# Patient Record
Sex: Male | Born: 1950 | Race: Black or African American | Hispanic: No | Marital: Single | Smoking: Former smoker
Health system: Southern US, Community
[De-identification: ages and names within clinical notes are randomized; demographics above are authoritative.]

## PROBLEM LIST (undated history)

## (undated) DIAGNOSIS — E785 Hyperlipidemia, unspecified: Secondary | ICD-10-CM

## (undated) DIAGNOSIS — H409 Unspecified glaucoma: Secondary | ICD-10-CM

## (undated) DIAGNOSIS — I10 Essential (primary) hypertension: Secondary | ICD-10-CM

## (undated) DIAGNOSIS — B1920 Unspecified viral hepatitis C without hepatic coma: Secondary | ICD-10-CM

## (undated) DIAGNOSIS — S8990XA Unspecified injury of unspecified lower leg, initial encounter: Secondary | ICD-10-CM

## (undated) DIAGNOSIS — K7460 Unspecified cirrhosis of liver: Secondary | ICD-10-CM

## (undated) DIAGNOSIS — K635 Polyp of colon: Secondary | ICD-10-CM

## (undated) DIAGNOSIS — I639 Cerebral infarction, unspecified: Secondary | ICD-10-CM

## (undated) DIAGNOSIS — M1990 Unspecified osteoarthritis, unspecified site: Secondary | ICD-10-CM

## (undated) DIAGNOSIS — H269 Unspecified cataract: Secondary | ICD-10-CM

## (undated) DIAGNOSIS — N400 Enlarged prostate without lower urinary tract symptoms: Secondary | ICD-10-CM

## (undated) HISTORY — DX: Hyperlipidemia, unspecified: E78.5

## (undated) HISTORY — PX: KNEE SURGERY: SHX244

## (undated) HISTORY — DX: Unspecified cirrhosis of liver: K74.60

## (undated) HISTORY — DX: Polyp of colon: K63.5

## (undated) HISTORY — DX: Unspecified osteoarthritis, unspecified site: M19.90

## (undated) HISTORY — DX: Cerebral infarction, unspecified: I63.9

## (undated) HISTORY — DX: Benign prostatic hyperplasia without lower urinary tract symptoms: N40.0

---
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
## 1979-04-03 HISTORY — PX: KNEE SURGERY: SHX244

## 1986-08-02 HISTORY — PX: INGUINAL HERNIA REPAIR: SUR1180

## 2012-08-02 DIAGNOSIS — I639 Cerebral infarction, unspecified: Secondary | ICD-10-CM

## 2012-08-02 HISTORY — DX: Cerebral infarction, unspecified: I63.9

## 2014-09-21 ENCOUNTER — Inpatient Hospital Stay (HOSPITAL_COMMUNITY): Payer: Medicare HMO

## 2014-09-21 ENCOUNTER — Inpatient Hospital Stay (HOSPITAL_BASED_OUTPATIENT_CLINIC_OR_DEPARTMENT_OTHER)
Admission: EM | Admit: 2014-09-21 | Discharge: 2014-09-25 | DRG: 065 | Disposition: A | Discharge status: Rehabilitation Facility | Payer: Medicare HMO | Attending: Family Medicine | Admitting: Family Medicine

## 2014-09-21 ENCOUNTER — Encounter (HOSPITAL_BASED_OUTPATIENT_CLINIC_OR_DEPARTMENT_OTHER): Payer: Self-pay | Admitting: *Deleted

## 2014-09-21 ENCOUNTER — Emergency Department (HOSPITAL_BASED_OUTPATIENT_CLINIC_OR_DEPARTMENT_OTHER): Payer: Medicare HMO

## 2014-09-21 DIAGNOSIS — E785 Hyperlipidemia, unspecified: Secondary | ICD-10-CM | POA: Diagnosis present

## 2014-09-21 DIAGNOSIS — R7989 Other specified abnormal findings of blood chemistry: Secondary | ICD-10-CM | POA: Diagnosis present

## 2014-09-21 DIAGNOSIS — R001 Bradycardia, unspecified: Secondary | ICD-10-CM | POA: Diagnosis present

## 2014-09-21 DIAGNOSIS — I69351 Hemiplegia and hemiparesis following cerebral infarction affecting right dominant side: Secondary | ICD-10-CM | POA: Diagnosis present

## 2014-09-21 DIAGNOSIS — D696 Thrombocytopenia, unspecified: Secondary | ICD-10-CM | POA: Diagnosis present

## 2014-09-21 DIAGNOSIS — I10 Essential (primary) hypertension: Secondary | ICD-10-CM | POA: Diagnosis present

## 2014-09-21 DIAGNOSIS — M174 Other bilateral secondary osteoarthritis of knee: Secondary | ICD-10-CM | POA: Diagnosis present

## 2014-09-21 DIAGNOSIS — Z6824 Body mass index (BMI) 24.0-24.9, adult: Secondary | ICD-10-CM

## 2014-09-21 DIAGNOSIS — I739 Peripheral vascular disease, unspecified: Secondary | ICD-10-CM | POA: Diagnosis present

## 2014-09-21 DIAGNOSIS — E669 Obesity, unspecified: Secondary | ICD-10-CM | POA: Diagnosis present

## 2014-09-21 DIAGNOSIS — Z8249 Family history of ischemic heart disease and other diseases of the circulatory system: Secondary | ICD-10-CM | POA: Diagnosis not present

## 2014-09-21 DIAGNOSIS — Z87891 Personal history of nicotine dependence: Secondary | ICD-10-CM | POA: Diagnosis not present

## 2014-09-21 DIAGNOSIS — R740 Nonspecific elevation of levels of transaminase and lactic acid dehydrogenase [LDH]: Secondary | ICD-10-CM

## 2014-09-21 DIAGNOSIS — E119 Type 2 diabetes mellitus without complications: Secondary | ICD-10-CM | POA: Diagnosis present

## 2014-09-21 DIAGNOSIS — R7401 Elevation of levels of liver transaminase levels: Secondary | ICD-10-CM

## 2014-09-21 DIAGNOSIS — Z823 Family history of stroke: Secondary | ICD-10-CM | POA: Diagnosis not present

## 2014-09-21 DIAGNOSIS — F1290 Cannabis use, unspecified, uncomplicated: Secondary | ICD-10-CM | POA: Diagnosis present

## 2014-09-21 DIAGNOSIS — I639 Cerebral infarction, unspecified: Secondary | ICD-10-CM | POA: Diagnosis present

## 2014-09-21 HISTORY — DX: Unspecified glaucoma: H40.9

## 2014-09-21 HISTORY — DX: Unspecified cataract: H26.9

## 2014-09-21 HISTORY — DX: Unspecified injury of unspecified lower leg, initial encounter: S89.90XA

## 2014-09-21 HISTORY — DX: Essential (primary) hypertension: I10

## 2014-09-21 LAB — URINALYSIS, ROUTINE W REFLEX MICROSCOPIC
Bilirubin Urine: NEGATIVE
Glucose, UA: NEGATIVE mg/dL
Hgb urine dipstick: NEGATIVE
KETONES UR: 15 mg/dL — AB
NITRITE: NEGATIVE
Protein, ur: NEGATIVE mg/dL
Specific Gravity, Urine: 1.019 (ref 1.005–1.030)
UROBILINOGEN UA: 0.2 mg/dL (ref 0.0–1.0)
pH: 5.5 (ref 5.0–8.0)

## 2014-09-21 LAB — DIFFERENTIAL
BASOS PCT: 1 % (ref 0–1)
Basophils Absolute: 0 10*3/uL (ref 0.0–0.1)
EOS PCT: 3 % (ref 0–5)
Eosinophils Absolute: 0.2 10*3/uL (ref 0.0–0.7)
LYMPHS ABS: 1.1 10*3/uL (ref 0.7–4.0)
Lymphocytes Relative: 22 % (ref 12–46)
MONO ABS: 0.4 10*3/uL (ref 0.1–1.0)
MONOS PCT: 9 % (ref 3–12)
NEUTROS ABS: 3.4 10*3/uL (ref 1.7–7.7)
NEUTROS PCT: 65 % (ref 43–77)

## 2014-09-21 LAB — COMPREHENSIVE METABOLIC PANEL
ALBUMIN: 3.7 g/dL (ref 3.5–5.2)
ALT: 45 U/L (ref 0–53)
AST: 56 U/L — ABNORMAL HIGH (ref 0–37)
Alkaline Phosphatase: 62 U/L (ref 39–117)
Anion gap: 6 (ref 5–15)
BUN: 13 mg/dL (ref 6–23)
CO2: 27 mmol/L (ref 19–32)
CREATININE: 0.98 mg/dL (ref 0.50–1.35)
Calcium: 8.7 mg/dL (ref 8.4–10.5)
Chloride: 102 mmol/L (ref 96–112)
GFR calc Af Amer: >90 mL/min (ref >90)
GFR, EST NON AFRICAN AMERICAN: 84 mL/min — AB (ref >90)
Glucose, Bld: 92 mg/dL (ref 70–99)
Potassium: 3.6 mmol/L (ref 3.5–5.1)
SODIUM: 135 mmol/L (ref 135–145)
TOTAL PROTEIN: 7.2 g/dL (ref 6.0–8.3)
Total Bilirubin: 0.9 mg/dL (ref 0.3–1.2)

## 2014-09-21 LAB — URINE MICROSCOPIC-ADD ON

## 2014-09-21 LAB — CBC
HCT: 45.4 % (ref 39.0–52.0)
HEMOGLOBIN: 15.3 g/dL (ref 13.0–17.0)
MCH: 30.9 pg (ref 26.0–34.0)
MCHC: 33.7 g/dL (ref 30.0–36.0)
MCV: 91.7 fL (ref 78.0–100.0)
PLATELETS: 175 10*3/uL (ref 150–400)
RBC: 4.95 MIL/uL (ref 4.22–5.81)
RDW: 12.9 % (ref 11.5–15.5)
WBC: 5.2 10*3/uL (ref 4.0–10.5)

## 2014-09-21 LAB — APTT: aPTT: 37 seconds (ref 24–37)

## 2014-09-21 LAB — RAPID URINE DRUG SCREEN, HOSP PERFORMED
Amphetamines: NOT DETECTED
BARBITURATES: NOT DETECTED
Benzodiazepines: NOT DETECTED
COCAINE: NOT DETECTED
Opiates: NOT DETECTED
TETRAHYDROCANNABINOL: POSITIVE — AB

## 2014-09-21 LAB — PROTIME-INR
INR: 1.09 (ref 0.00–1.49)
Prothrombin Time: 14.1 seconds (ref 11.6–15.2)

## 2014-09-21 LAB — ETHANOL

## 2014-09-21 LAB — TROPONIN I: Troponin I: <0.03 ng/mL (ref <0.031)

## 2014-09-21 MED ORDER — ASPIRIN 300 MG RE SUPP: 300.0000 mg | Freq: Every day | RECTAL | Status: DC

## 2014-09-21 MED ORDER — ENOXAPARIN SODIUM 40 MG/0.4ML ~~LOC~~ SOLN
40.0000 mg | Freq: Every day | SUBCUTANEOUS | Status: DC
  Administered 2014-09-21 – 2014-09-24 (×4): 40 mg via SUBCUTANEOUS
  Filled 2014-09-21 (×4): qty 0.4

## 2014-09-21 MED ORDER — STROKE: EARLY STAGES OF RECOVERY BOOK
Freq: Once | Status: AC
  Administered 2014-09-21: 1

## 2014-09-21 MED ORDER — ASPIRIN 325 MG PO TABS
325.0000 mg | ORAL_TABLET | Freq: Every day | ORAL | Status: DC
  Administered 2014-09-21 – 2014-09-25 (×5): 325 mg via ORAL
  Filled 2014-09-21 (×5): qty 1

## 2014-09-21 MED ORDER — SODIUM CHLORIDE 0.9 % IV SOLN
INTRAVENOUS | Status: DC
  Administered 2014-09-21: 23:00:00 via INTRAVENOUS

## 2014-09-21 MED ORDER — SENNOSIDES-DOCUSATE SODIUM 8.6-50 MG PO TABS
1.0000 | ORAL_TABLET | Freq: Every evening | ORAL | Status: DC | PRN
  Administered 2014-09-23: 1 via ORAL
  Filled 2014-09-21: qty 1

## 2014-09-21 MED ORDER — HYDRALAZINE HCL 20 MG/ML IJ SOLN: 10.0000 mg | INTRAMUSCULAR | Status: DC | PRN

## 2014-09-21 NOTE — ED Notes (Signed)
[facility] here no changes.

## 2014-09-21 NOTE — ED Notes (Signed)
Pt out with [facility].

## 2014-09-21 NOTE — H&P (Signed)
[facility] History and Physical  [name] ONG:[id number] DOB: Aug 15, 1950 DOA: 09/21/2014  Referring physician: Patient was transferred from [facility]. PCP: Micheal Brazil, MD   Chief Complaint: Difficulty walking and right-sided weakness.  HPI: [name] is a 65 y.o. male with history of elevated blood pressure presently ER because of difficulty walking and right-sided weakness. Patient states his symptoms started almost a week ago with complaints of right-sided weakness and difficulty walking. Patient's sister saw the patient has been finding it difficult to walk to do daily living activities today and advised him to come to the ER. CT head shows possible subacute stroke. On exam patient has mild weakness on the right side. Patient has been admitted for further stroke workup. Patient states he was told he had high blood pressure 6 months ago but he did not follow on this. Patient otherwise denies any visual symptoms headache difficulty talking or swallowing. Has no weakness on the left side. Denies chest pain or shortness of breath.   Review of Systems: As presented in the history of presenting illness, rest negative.  Past Medical History  Diagnosis Date  . Knee injury     right knee injury   . Hypertension    Past Surgical History  Procedure Laterality Date  . No past surgeries     Social History:  reports that he has never smoked. He does not have any smokeless tobacco history on file. He reports that he does not drink alcohol. His drug history is not on file. Where does patient live home. Can patient participate in ADLs? Yes.  No Known Allergies  Family History:  Family History  Problem Relation Age of Onset  . Diabetes Mellitus II Mother   . Stroke Mother       Prior to Admission medications   Not on File    Physical Exam: Filed Vitals:   09/21/14 1900 09/21/14 1930 09/21/14 1936 09/21/14 2055  BP: 189/110 188/101 198/98 182/99   Pulse: 78 75 80 79  Temp:    99.1 F (37.3 C)  TempSrc:    Oral  Resp: 22 21 24 20   Height:      Weight:      SpO2: 99% 99% 100% 99%     General:  Moderately built and nourished.  Eyes: Anicteric no pallor.  ENT: No discharge from the ears eyes nose or mouth.  Neck: No mass felt. No neck rigidity.  Cardiovascular: S1-S2 heard.  Respiratory: No rhonchi or crepitations.  Abdomen: Soft nontender bowel sounds present.  Skin: No rash.  Musculoskeletal: No edema.  Psychiatric: Appears normal.  Neurologic: Alert awake oriented to time place and person. Has mild weakness in the right upper extremity and right lower extremity. Perla positive. Tongue is midline. No facial asymmetry.  Labs on Admission:  Basic Metabolic Panel:  Recent Labs Lab 09/21/14 1553  NA 135  K 3.6  CL 102  CO2 27  GLUCOSE 92  BUN 13  CREATININE 0.98  CALCIUM 8.7   Liver Function Tests:  Recent Labs Lab 09/21/14 1553  AST 56*  ALT 45  ALKPHOS 62  BILITOT 0.9  PROT 7.2  ALBUMIN 3.7   No results for input(s): LIPASE, AMYLASE in the last 168 hours. No results for input(s): AMMONIA in the last 168 hours. CBC:  Recent Labs Lab 09/21/14 1553  WBC 5.2  NEUTROABS 3.4  HGB 15.3  HCT 45.4  MCV 91.7  PLT 175   Cardiac Enzymes:  Recent  Labs Lab 09/21/14 1553  TROPONINI <0.03    BNP (last 3 results) No results for input(s): BNP in the last 8760 hours.  ProBNP (last 3 results) No results for input(s): PROBNP in the last 8760 hours.  CBG: No results for input(s): GLUCAP in the last 168 hours.  Radiological Exams on Admission: Ct Head Wo Contrast  09/21/2014   CLINICAL DATA:  Elevated blood pressure. Right-sided weakness for 1 week. History of hypertension.  EXAM: CT HEAD WITHOUT CONTRAST  TECHNIQUE: Contiguous axial images were obtained from the base of the skull through the vertex without intravenous contrast.  COMPARISON:  None.  FINDINGS: Ventricles are normal in size and  configuration.  There are no parenchymal masses or mass effect.  There is an area of hypoattenuation in the left centrum semiovale, likely due to chronic microvascular ischemic change. It could, potentially, reflect a subacute white matter infarction.  There other patchy areas of white matter hypoattenuation consistent with mild to moderate chronic microvascular ischemic change.  There is no evidence of a transcortical infarct.  There are no extra-axial masses or abnormal fluid collections.  There is no intracranial hemorrhage.  Visualized sinuses and mastoid air cells are clear. No skull lesion.  IMPRESSION: 1. Possible subacute white matter infarct along the left centrum semiovale. This could be further assessed with brain MRI if desired clinically. 2. No other evidence of a recent infarct. No intracranial hemorrhage. 3. Mild moderate chronic microvascular ischemic change.   Electronically Signed   By: Lajean Manes M.D.   On: 09/21/2014 17:25    EKG: Independently reviewed. Normal sinus rhythm with poor R-wave progression and LVH.  Assessment/Plan Principal Problem:   Stroke Active Problems:   Uncontrolled hypertension   1. Stroke - patient's symptoms are concerning for stroke with CAT scan findings. At this time I did discuss with Dr. Leonel Ramsay on call neurologist who will be seeing patient in consult. Patient is well beyond the TPA window period. At this time I have ordered MRI brain and MRA brain 2-D echo carotid Doppler PT OT swallow evaluation and we will closely monitor in telemetry for any arrhythmias. Aspirin. 2. Uncontrolled hypertension - at this time we will allow for permissive hypertension and I have placed patient on when necessary IV hydralazine for systolic blood pressure more than 210. If patient's blood pressure remains high will need defenite antihypertensives slowly added.   3. Marijuana positive in the urine drug screen - counselling requested.  chest x-ray is  pending.   DVT ProphylaxisLovenox. Code Status: Full code. Family Communication: Patient's sister at the bedside.  Disposition Plan:Admitting patient.     [name] N. [facility] Pager [phone].  If 7PM-7AM, please contact night-coverage [email or web address] Password [facility] 09/21/2014, 9:51 PM

## 2014-09-21 NOTE — ED Notes (Signed)
Denies: pain, sob, nausea, dizziness or other sx. Alert, NAD, calm, interactive. Report given via phone to [facility]. Pending arrival for transport to [facility]. Wife at [facility].

## 2014-09-21 NOTE — ED Provider Notes (Signed)
CSN: [id number]     Arrival date & time 09/21/14  1504 History   First MD Initiated Contact with Patient 09/21/14 1523     Chief Complaint  Patient presents with  . Weakness     (Consider location/radiation/quality/duration/timing/severity/associated sxs/prior Treatment) HPI Comments: [name] is a 64 y.o. male with a PMHx of HTN, who presents to the ED with complaints of right-sided weakness that began on Monday. Patient reports that he called the ambulance, who noted that his blood pressure was high and that he needs to see a primary care doctor, but did not transport him anywhere. He sought attention at the urgent care today, who directed him to the emergency room for possible stroke. Patient reports that he does not take any medications for his blood pressure because he does not go to a primary care doctor. He reports that the right arm and right leg have been weak since Monday, with no improvement during the course of the week. He reports some tingling in the right upper extremity and his sister at bedside reports some difficulties with his word finding, but does report that he has a stutter at baseline therefore difficult to determine whether he is truly having word finding difficulties. They deny any facial droop, asymmetry, or syncope. Patient denies any fevers, chills, neck pain or stiffness, headache, vision changes, dizziness, lightheadedness, chest pain, shortness of breath, abdominal pain, nausea, vomiting, diarrhea, constipation, dysuria, hematuria, or numbness. Denies any back pain. Reports that he has been having difficulty walking due to the right leg weakness. Patient has PCP Dr. Katherine Roan but has not been recently.  Patient is a 64 y.o. male presenting with weakness. The history is provided by the patient. No language interpreter was used.  Weakness This is a new problem. The current episode started in the past 7 days. The problem occurs constantly. The problem has been  unchanged. Associated symptoms include weakness (RUE/RLE). Pertinent negatives include no abdominal pain, arthralgias, chest pain, chills, coughing, fever, headaches, myalgias, nausea, neck pain, numbness, urinary symptoms, vertigo, visual change or vomiting. Nothing aggravates the symptoms. He has tried nothing for the symptoms. The treatment provided no relief.    Past Medical History  Diagnosis Date  . Knee injury     right knee injury   . Hypertension    History reviewed. No pertinent past surgical history. No family history on file. History  Substance Use Topics  . Smoking status: Never Smoker   . Smokeless tobacco: Not on file  . Alcohol Use: No    Review of Systems  Constitutional: Negative for fever and chills.  Respiratory: Negative for cough and shortness of breath.   Cardiovascular: Negative for chest pain.  Gastrointestinal: Negative for nausea, vomiting, abdominal pain, diarrhea and constipation.  Genitourinary: Negative for dysuria and hematuria.  Musculoskeletal: Negative for myalgias, back pain, arthralgias, neck pain and neck stiffness.  Skin: Negative for color change.  Neurological: Positive for speech difficulty (some difficulty with word finding) and weakness (RUE/RLE). Negative for dizziness, vertigo, tremors, syncope, facial asymmetry, light-headedness, numbness and headaches.       +tingling in RUE  Psychiatric/Behavioral: Negative for confusion.   10 Systems reviewed and are negative for acute change except as noted in the HPI.    Allergies  Review of patient's allergies indicates no known allergies.  Home Medications   Prior to Admission medications   Not on File   BP 186/120 mmHg  Pulse 92  Temp(Src) 98.8 F (37.1 C) (Oral)  Resp  20  Ht 5\' 10"  (1.778 m)  Wt 174 lb (78.926 kg)  BMI 24.97 kg/m2  SpO2 99% Physical Exam  Constitutional: He is oriented to person, place, and time. He appears well-developed and well-nourished.  Non-toxic  appearance. No distress.  Afebrile nontoxic NAD, BP at 173/99  HENT:  Head: Normocephalic and atraumatic.  Mouth/Throat: Oropharynx is clear and moist and mucous membranes are normal.  No facial asymmetry  Eyes: Conjunctivae and EOM are normal. Pupils are equal, round, and reactive to light. Right eye exhibits no discharge. Left eye exhibits no discharge.  PERRL, EOMI without nystagmus  Neck: Normal range of motion. Neck supple.  Cardiovascular: Normal rate, regular rhythm, normal heart sounds and intact distal pulses.  Exam reveals no gallop and no friction rub.   No murmur heard. RRR, nl s1/s2, no m/r/g, distal pulses intact, no pedal edema   Pulmonary/Chest: Effort normal and breath sounds normal. No respiratory distress. He has no decreased breath sounds. He has no wheezes. He has no rhonchi. He has no rales.  CTAB in all lung fields, no w/r/r, no hypoxia or increased WOB, speaking in full sentences, SpO2 99% on RA   Abdominal: Soft. Normal appearance and bowel sounds are normal. He exhibits no distension. There is no tenderness. There is no rigidity, no rebound, no guarding, no CVA tenderness, no tenderness at McBurney's point and negative Murphy's sign.  Musculoskeletal: Normal range of motion.  MAE x4 Strength 5/5 in all extremities Sensation grossly intact in all extremities Distal pulses intact  Neurological: He is alert and oriented to person, place, and time. He has normal strength. No cranial nerve deficit or sensory deficit. Coordination normal. GCS eye subscore is 4. GCS verbal subscore is 5. GCS motor subscore is 6.  CN 2-12 grossly intact A&O x4 GCS 15 Sensation and strength intact Coordination with finger-to-nose WNL +R hand pronator drift  Speech goal oriented  Skin: Skin is warm, dry and intact. No rash noted.  Psychiatric: He has a normal mood and affect.  Nursing note and vitals reviewed.   ED Course  Procedures (including critical care time) Labs Review Labs  Reviewed  COMPREHENSIVE METABOLIC PANEL - Abnormal; Notable for the following:    AST 56 (*)    GFR calc non Af Amer 84 (*)    All other components within normal limits  URINE RAPID DRUG SCREEN (HOSP PERFORMED) - Abnormal; Notable for the following:    Tetrahydrocannabinol POSITIVE (*)    All other components within normal limits  URINALYSIS, ROUTINE W REFLEX MICROSCOPIC - Abnormal; Notable for the following:    Ketones, ur 15 (*)    Leukocytes, UA TRACE (*)    All other components within normal limits  URINE MICROSCOPIC-ADD ON - Abnormal; Notable for the following:    Bacteria, UA FEW (*)    All other components within normal limits  ETHANOL  PROTIME-INR  APTT  CBC  DIFFERENTIAL  TROPONIN I    Imaging Review Ct Head Wo Contrast  09/21/2014   CLINICAL DATA:  Elevated blood pressure. Right-sided weakness for 1 week. History of hypertension.  EXAM: CT HEAD WITHOUT CONTRAST  TECHNIQUE: Contiguous axial images were obtained from the base of the skull through the vertex without intravenous contrast.  COMPARISON:  None.  FINDINGS: Ventricles are normal in size and configuration.  There are no parenchymal masses or mass effect.  There is an area of hypoattenuation in the left centrum semiovale, likely due to chronic microvascular ischemic change. It could,  potentially, reflect a subacute white matter infarction.  There other patchy areas of white matter hypoattenuation consistent with mild to moderate chronic microvascular ischemic change.  There is no evidence of a transcortical infarct.  There are no extra-axial masses or abnormal fluid collections.  There is no intracranial hemorrhage.  Visualized sinuses and mastoid air cells are clear. No skull lesion.  IMPRESSION: 1. Possible subacute white matter infarct along the left centrum semiovale. This could be further assessed with brain MRI if desired clinically. 2. No other evidence of a recent infarct. No intracranial hemorrhage. 3. Mild moderate  chronic microvascular ischemic change.   Electronically Signed   By: Lajean Manes M.D.   On: 09/21/2014 17:25     EKG Interpretation   Date/Time:  Saturday September 21 2014 16:06:29 EST Ventricular Rate:  77 PR Interval:  174 QRS Duration: 102 QT Interval:  400 QTC Calculation: 452 R Axis:   -69 Text Interpretation:  Normal sinus rhythm Left axis deviation Minimal  voltage criteria for LVH, may be normal variant Septal infarct , age  undetermined Abnormal ECG No prior for comparison Confirmed by DOCHERTY   MD, MEGAN [phone]) on 09/21/2014 4:51:08 PM      MDM   Final diagnoses:  Stroke  Essential hypertension    64 y.o. male with R sided weakness since Monday, and elevated BP. BP here 186/120 initially but during exam BP was 173/99. No focal weakness noted, but slight R pronator drift concerning for stroke. Outside TPA window. Will obtain labs and head CT. Will reassess shortly.   6:40 PM UDS showing THC. U/A without signs of infection, likely contaminated. EtOH neg. INR WNL. APTT WNL. CBC w/diff WNL. CMP with mildly elevated AST but otherwise WNL. Trop WNL. EKG with some LAD/LVH. Head CT showing possible subacute white matter infarct to L centrum semiovale. Dr. Tawnya Crook assessed pt and agrees with transferring pt to [facility] for further management of stroke. Will consult hospitalist for admission.   7:06 PM Dr. Gilles Chiquito of [facility] returning page, will accept transfer. BPs 170s/110s now, continues to be asymptomatic. Will allow this to remain since it's somewhat neuroprotective. Please see H&P for ongoing documentation of care. Pt stable at this time.   Patty Sermons [place], [place] 09/21/14 1911  Ernestina Patches, MD 09/22/14 [phone]

## 2014-09-21 NOTE — ED Notes (Signed)
Pt states that he has been running high BP and when he woke up last Monday he noticed that he had right sided weakness.  Pt reports that he called an ambulance and they told him that his BP was up but did not transport him to any hospital. Today pt went to be seen at [facility] and was told to come here to have a CT as they were concerned about the possibility of stroke.  No facial droop, mild right arm drift and mild right arm weakness. Pt is alert and oriented.

## 2014-09-21 NOTE — Progress Notes (Signed)
Patient arrived to [facility] via [facility]. Patient alert and oriented. Family at bedside. Admissions notified of patients arrival. Burnell Blanks, RN

## 2014-09-21 NOTE — ED Notes (Signed)
Called [facility]--spoke with Kennyth Lose to report room 4N20C

## 2014-09-21 NOTE — Progress Notes (Signed)
Called re: patient at [facility].  1 week history of right sided weakness, + right pronator drift on exam.  CT head with a possible subacute infarct, recommending MRI.  Transfer to [facility] telemetry bed for further stroke work up.   Gilles Chiquito, MD

## 2014-09-22 ENCOUNTER — Inpatient Hospital Stay (HOSPITAL_COMMUNITY): Payer: Medicare HMO

## 2014-09-22 DIAGNOSIS — I639 Cerebral infarction, unspecified: Secondary | ICD-10-CM

## 2014-09-22 DIAGNOSIS — E785 Hyperlipidemia, unspecified: Secondary | ICD-10-CM

## 2014-09-22 DIAGNOSIS — D696 Thrombocytopenia, unspecified: Secondary | ICD-10-CM

## 2014-09-22 DIAGNOSIS — I10 Essential (primary) hypertension: Secondary | ICD-10-CM | POA: Insufficient documentation

## 2014-09-22 LAB — LIPID PANEL
CHOL/HDL RATIO: 3.5 ratio
Cholesterol: 127 mg/dL (ref 0–200)
HDL: 36 mg/dL — AB (ref >39)
LDL CALC: 80 mg/dL (ref 0–99)
Triglycerides: 55 mg/dL (ref <150)
VLDL: 11 mg/dL (ref 0–40)

## 2014-09-22 LAB — COMPREHENSIVE METABOLIC PANEL
ALT: 43 U/L (ref 0–53)
AST: 58 U/L — ABNORMAL HIGH (ref 0–37)
Albumin: 3 g/dL — ABNORMAL LOW (ref 3.5–5.2)
Alkaline Phosphatase: 57 U/L (ref 39–117)
Anion gap: 5 (ref 5–15)
BUN: 14 mg/dL (ref 6–23)
CO2: 31 mmol/L (ref 19–32)
Calcium: 8.8 mg/dL (ref 8.4–10.5)
Chloride: 104 mmol/L (ref 96–112)
Creatinine, Ser: 1.09 mg/dL (ref 0.50–1.35)
GFR calc Af Amer: 80 mL/min — ABNORMAL LOW (ref >90)
GFR calc non Af Amer: 69 mL/min — ABNORMAL LOW (ref >90)
Glucose, Bld: 79 mg/dL (ref 70–99)
POTASSIUM: 3.6 mmol/L (ref 3.5–5.1)
SODIUM: 140 mmol/L (ref 135–145)
Total Bilirubin: 0.9 mg/dL (ref 0.3–1.2)
Total Protein: 6 g/dL (ref 6.0–8.3)

## 2014-09-22 LAB — TSH: TSH: 0.785 u[IU]/mL (ref 0.350–4.500)

## 2014-09-22 LAB — CBC
HCT: 41.4 % (ref 39.0–52.0)
Hemoglobin: 13.7 g/dL (ref 13.0–17.0)
MCH: 31.1 pg (ref 26.0–34.0)
MCHC: 33.1 g/dL (ref 30.0–36.0)
MCV: 93.9 fL (ref 78.0–100.0)
Platelets: 152 10*3/uL (ref 150–400)
RBC: 4.41 MIL/uL (ref 4.22–5.81)
RDW: 13.2 % (ref 11.5–15.5)
WBC: 5.4 10*3/uL (ref 4.0–10.5)

## 2014-09-22 LAB — CBC WITH DIFFERENTIAL/PLATELET
BASOS ABS: 0 10*3/uL (ref 0.0–0.1)
BASOS PCT: 1 % (ref 0–1)
Eosinophils Absolute: 0.2 10*3/uL (ref 0.0–0.7)
Eosinophils Relative: 5 % (ref 0–5)
HCT: 40.8 % (ref 39.0–52.0)
HEMOGLOBIN: 13.6 g/dL (ref 13.0–17.0)
LYMPHS PCT: 34 % (ref 12–46)
Lymphs Abs: 1.4 10*3/uL (ref 0.7–4.0)
MCH: 31.4 pg (ref 26.0–34.0)
MCHC: 33.3 g/dL (ref 30.0–36.0)
MCV: 94.2 fL (ref 78.0–100.0)
MONO ABS: 0.5 10*3/uL (ref 0.1–1.0)
Monocytes Relative: 11 % (ref 3–12)
NEUTROS PCT: 49 % (ref 43–77)
Neutro Abs: 2.1 10*3/uL (ref 1.7–7.7)
Platelets: 143 10*3/uL — ABNORMAL LOW (ref 150–400)
RBC: 4.33 MIL/uL (ref 4.22–5.81)
RDW: 13.2 % (ref 11.5–15.5)
WBC: 4.2 10*3/uL (ref 4.0–10.5)

## 2014-09-22 LAB — HEPATITIS B SURFACE ANTIGEN: HEP B S AG: NEGATIVE

## 2014-09-22 LAB — CREATININE, SERUM
Creatinine, Ser: 1.19 mg/dL (ref 0.50–1.35)
GFR calc Af Amer: 72 mL/min — ABNORMAL LOW (ref >90)
GFR, EST NON AFRICAN AMERICAN: 62 mL/min — AB (ref >90)

## 2014-09-22 LAB — VITAMIN B12: Vitamin B-12: >2000 pg/mL — ABNORMAL HIGH (ref 211–911)

## 2014-09-22 LAB — HEPATITIS C ANTIBODY: HCV AB: REACTIVE — AB

## 2014-09-22 MED ORDER — ATORVASTATIN CALCIUM 10 MG PO TABS
10.0000 mg | ORAL_TABLET | Freq: Every day | ORAL | Status: DC
  Administered 2014-09-22 – 2014-09-24 (×3): 10 mg via ORAL
  Filled 2014-09-22 (×5): qty 1

## 2014-09-22 MED ORDER — AMLODIPINE BESYLATE 5 MG PO TABS
5.0000 mg | ORAL_TABLET | Freq: Every day | ORAL | Status: DC
  Administered 2014-09-22 – 2014-09-23 (×2): 5 mg via ORAL
  Filled 2014-09-22 (×3): qty 1

## 2014-09-22 NOTE — Progress Notes (Signed)
PROGRESS NOTE  Schuyler Olden VPX:[id number] DOB: [birth year] DOA: 09/21/2014 PCP: Leola Brazil, MD  Brief history 64 year old male with a history of hypertension presented with one-week history of gait instability, difficulty walking, and right-sided weakness of his arms greater than his legs. The patient also had some slurred speech. In the emergency department, CT of the brain showed a subacute stroke. The patient was admitted for a full stroke workup. MRI of the brain revealed subacute left basal ganglia infarct with remote pontine and left basal ganglia infarcts.  Assessment/Plan: Subacute stroke left basal ganglia -As the patient's symptoms have been present for one week, and MRI suggests subacute infarct, start antihypertensive medications -MRI brain--subacute left infarct basal ganglia, remote pontine infarct -MRA brain--no hemodynamically significant stenosis -Hemoglobin A1c -LDL--80 -Carotid duplex -Echocardiogram -PT/OT/ST -Appreciate neurology consult -Continue aspirin Hypertension -Start amlodipine -Patient is mildly bradycardic, BB not a good initial option Dyslipidemia -Start statin Marijuana use -Cessation discussed Thrombocytopenia -Serum B12 -HIV Transaminasemia -Hepatitis B surface antigen -Hepatitis C antibody -RUQ [place] Family Communication:   Pt at beside Disposition Plan:   Home when medically stable       Procedures/Studies: Dg Chest 2 View  09/22/2014   CLINICAL DATA:  Stroke protocol.  EXAM: CHEST  2 VIEW  COMPARISON:  None  FINDINGS: There is normal heart size. No pleural effusion or edema. No airspace consolidation or atelectasis noted. Calcified atherosclerotic plaque involves the thoracic aorta.  IMPRESSION: No active cardiopulmonary abnormalities.   Electronically Signed   By: Kerby Moors M.D.   On: 09/22/2014 09:32   Ct Head Wo Contrast  09/21/2014   CLINICAL DATA:  Elevated blood pressure. Right-sided weakness for 1  week. History of hypertension.  EXAM: CT HEAD WITHOUT CONTRAST  TECHNIQUE: Contiguous axial images were obtained from the base of the skull through the vertex without intravenous contrast.  COMPARISON:  None.  FINDINGS: Ventricles are normal in size and configuration.  There are no parenchymal masses or mass effect.  There is an area of hypoattenuation in the left centrum semiovale, likely due to chronic microvascular ischemic change. It could, potentially, reflect a subacute white matter infarction.  There other patchy areas of white matter hypoattenuation consistent with mild to moderate chronic microvascular ischemic change.  There is no evidence of a transcortical infarct.  There are no extra-axial masses or abnormal fluid collections.  There is no intracranial hemorrhage.  Visualized sinuses and mastoid air cells are clear. No skull lesion.  IMPRESSION: 1. Possible subacute white matter infarct along the left centrum semiovale. This could be further assessed with brain MRI if desired clinically. 2. No other evidence of a recent infarct. No intracranial hemorrhage. 3. Mild moderate chronic microvascular ischemic change.   Electronically Signed   By: Lajean Manes M.D.   On: 09/21/2014 17:25   Mr Brain Wo Contrast  09/22/2014   CLINICAL DATA:  Initial evaluation for acute onset right-sided weakness that began on Monday. Evaluate for stroke.  EXAM: MRI HEAD WITHOUT CONTRAST  MRA HEAD WITHOUT CONTRAST  TECHNIQUE: Multiplanar, multiecho pulse sequences of the brain and surrounding structures were obtained without intravenous contrast. Angiographic images of the head were obtained using MRA technique without contrast.  COMPARISON:  Prior CT earlier the same day.  FINDINGS: MRI HEAD FINDINGS  Mild diffuse prominence of the CSF containing spaces is compatible with generalized cerebral atrophy. Patchy and confluent T2/FLAIR hyperintensity within the periventricular and deep white matter both cerebral hemispheres  most  consistent with moderate chronic small vessel ischemic disease.  Small remote left pontine infarct noted. Additional small remote loop donor infarct within the posterior left lentiform nucleus. There is chronic blood products with the left basal ganglia infarct.  There is an area mildly increased signal intensity on DWI sequence located in the superior left basal ganglia extending into the periventricular white matter of the left corona radiata (series 4, image 30). This infarct is likely subacute nature, as the ADC signal abnormality has largely normalized. No associated hemorrhage or significant mass effect. No other infarct.  No mass lesion or midline shift. Ventricles are normal size without hydrocephalus. No extra-axial fluid collection.  Craniocervical junction is normal. Pituitary gland within normal limits.  No acute abnormality seen about the orbits.  Paranasal sinuses are clear.  No mastoid effusion.  Bone marrow signal intensity within normal limits. Visualized upper cervical spine unremarkable. Scalp soft tissues within normal limits.  MRA HEAD FINDINGS  ANTERIOR CIRCULATION:  The distal cervical segments of the internal carotid arteries are widely patent with antegrade flow. The petrous, cavernous, and supra clinoid segments are well opacified without significant stenosis. A1 segments, anterior communicating artery, and anterior cerebral arteries widely patent. Median callosal branch noted. M1 segments widely patent without significant stenosis or occlusion. MCA bifurcation is normal. Distal MCA branches within normal limits.  POSTERIOR CIRCULATION:  Left vertebral artery is dominant and widely patent to the vertebrobasilar junction. Diminutive right vertebral artery divides at the right posterior inferior cerebellar artery, with a small hypoplastic branch ascending torus the vertebrobasilar junction. The right posterior inferior cerebral artery is patent. The left posterior inferior cerebral artery is  not visualized. Basilar artery widely patent. Anterior inferior cerebral arteries are patent bilaterally. The left AICA is dominant. Superior cerebellar arteries widely patent. P1 and P2 segments widely patent.  No aneurysm.  IMPRESSION: MRI HEAD IMPRESSION:  1. Subacute ischemic infarct involving the superior left basal ganglia with cephalad extension into the periventricular white matter of the left corona radiata. No associated hemorrhage or significant mass effect. 2. Tiny remote left pontine and left basal ganglia lacunar infarcts. 3. Atrophy with moderate chronic microvascular ischemic disease.  MRA HEAD IMPRESSION:  Normal intracranial MRA without evidence of hemodynamically significant stenosis or proximal branch occlusion.   Electronically Signed   By: Jeannine Boga M.D.   On: 09/22/2014 07:10   Mr Jodene Nam Head/brain Wo Cm  09/22/2014   CLINICAL DATA:  Initial evaluation for acute onset right-sided weakness that began on Monday. Evaluate for stroke.  EXAM: MRI HEAD WITHOUT CONTRAST  MRA HEAD WITHOUT CONTRAST  TECHNIQUE: Multiplanar, multiecho pulse sequences of the brain and surrounding structures were obtained without intravenous contrast. Angiographic images of the head were obtained using MRA technique without contrast.  COMPARISON:  Prior CT earlier the same day.  FINDINGS: MRI HEAD FINDINGS  Mild diffuse prominence of the CSF containing spaces is compatible with generalized cerebral atrophy. Patchy and confluent T2/FLAIR hyperintensity within the periventricular and deep white matter both cerebral hemispheres most consistent with moderate chronic small vessel ischemic disease.  Small remote left pontine infarct noted. Additional small remote loop donor infarct within the posterior left lentiform nucleus. There is chronic blood products with the left basal ganglia infarct.  There is an area mildly increased signal intensity on DWI sequence located in the superior left basal ganglia extending into  the periventricular white matter of the left corona radiata (series 4, image 30). This infarct is likely subacute nature, as the  ADC signal abnormality has largely normalized. No associated hemorrhage or significant mass effect. No other infarct.  No mass lesion or midline shift. Ventricles are normal size without hydrocephalus. No extra-axial fluid collection.  Craniocervical junction is normal. Pituitary gland within normal limits.  No acute abnormality seen about the orbits.  Paranasal sinuses are clear.  No mastoid effusion.  Bone marrow signal intensity within normal limits. Visualized upper cervical spine unremarkable. Scalp soft tissues within normal limits.  MRA HEAD FINDINGS  ANTERIOR CIRCULATION:  The distal cervical segments of the internal carotid arteries are widely patent with antegrade flow. The petrous, cavernous, and supra clinoid segments are well opacified without significant stenosis. A1 segments, anterior communicating artery, and anterior cerebral arteries widely patent. Median callosal branch noted. M1 segments widely patent without significant stenosis or occlusion. MCA bifurcation is normal. Distal MCA branches within normal limits.  POSTERIOR CIRCULATION:  Left vertebral artery is dominant and widely patent to the vertebrobasilar junction. Diminutive right vertebral artery divides at the right posterior inferior cerebellar artery, with a small hypoplastic branch ascending torus the vertebrobasilar junction. The right posterior inferior cerebral artery is patent. The left posterior inferior cerebral artery is not visualized. Basilar artery widely patent. Anterior inferior cerebral arteries are patent bilaterally. The left AICA is dominant. Superior cerebellar arteries widely patent. P1 and P2 segments widely patent.  No aneurysm.  IMPRESSION: MRI HEAD IMPRESSION:  1. Subacute ischemic infarct involving the superior left basal ganglia with cephalad extension into the periventricular white  matter of the left corona radiata. No associated hemorrhage or significant mass effect. 2. Tiny remote left pontine and left basal ganglia lacunar infarcts. 3. Atrophy with moderate chronic microvascular ischemic disease.  MRA HEAD IMPRESSION:  Normal intracranial MRA without evidence of hemodynamically significant stenosis or proximal branch occlusion.   Electronically Signed   By: Jeannine Boga M.D.   On: 09/22/2014 07:10         Subjective:  Patient states that his speech has improved. He states that his right upper extremity weakness is 75% better. Denies any fevers, chills, chest pain, shortness of breath, nausea, vomiting, diarrhea, abdominal pain.  Objective: Filed Vitals:   09/22/14 0130 09/22/14 0333 09/22/14 0530 09/22/14 0800  BP: 152/97 161/96 165/96 161/91  Pulse: 66 62 56 65  Temp: 98.4 F (36.9 C) 98.2 F (36.8 C) 98.1 F (36.7 C) 98.6 F (37 C)  TempSrc: Oral Oral Oral Oral  Resp: 20 18 20 20   Height:      Weight:      SpO2: 99% 98% 100% 99%    Intake/Output Summary (Last 24 hours) at 09/22/14 1309 Last data filed at 09/22/14 0800  Gross per 24 hour  Intake      0 ml  Output    350 ml  Net   -350 ml   Weight change:  Exam:   General:  Pt is alert, follows commands appropriately, not in acute distress  HEENT: No icterus, No thrush,  [place]/AT  Cardiovascular: RRR, S1/S2, no rubs, no gallops  Respiratory: bibasilar crackles. Left foot to auscultation.  Abdomen: Soft/+BS, non tender, non distended, no guarding  Extremities: trace LE edema, No lymphangitis, No petechiae, No rashes, no synovitis  Data Reviewed: Basic Metabolic Panel:  Recent Labs Lab 09/21/14 1553 09/22/14 0030 09/22/14 0710  NA 135  --  140  K 3.6  --  3.6  CL 102  --  104  CO2 27  --  31  GLUCOSE 92  --  79  BUN 13  --  14  CREATININE 0.98 1.19 1.09  CALCIUM 8.7  --  8.8   Liver Function Tests:  Recent Labs Lab 09/21/14 1553 09/22/14 0710  AST 56* 58*  ALT 45  43  ALKPHOS 62 57  BILITOT 0.9 0.9  PROT 7.2 6.0  ALBUMIN 3.7 3.0*   No results for input(s): LIPASE, AMYLASE in the last 168 hours. No results for input(s): AMMONIA in the last 168 hours. CBC:  Recent Labs Lab 09/21/14 1553 09/22/14 0030 09/22/14 0710  WBC 5.2 5.4 4.2  NEUTROABS 3.4  --  2.1  HGB 15.3 13.7 13.6  HCT 45.4 41.4 40.8  MCV 91.7 93.9 94.2  PLT 175 152 143*   Cardiac Enzymes:  Recent Labs Lab 09/21/14 1553  TROPONINI <0.03   BNP: Invalid input(s): POCBNP CBG: No results for input(s): GLUCAP in the last 168 hours.  No results found for this or any previous visit (from the past 240 hour(s)).   Scheduled Meds: . aspirin  300 mg Rectal Daily   Or  . aspirin  325 mg Oral Daily  . enoxaparin (LOVENOX) injection  40 mg Subcutaneous QHS   Continuous Infusions: . sodium chloride 75 mL/hr at 09/21/14 2235     Ritchard Paragas, DO  [facility] Pager [phone]  If 7PM-7AM, please contact night-coverage [email or web address] Password [email or web address] 09/22/2014, 1:09 PM   LOS: 1 day

## 2014-09-22 NOTE — Evaluation (Signed)
Physical Therapy Evaluation Patient Details Name: [name] MRN: [id number] DOB: 11/17/1950 Today's Date: 09/22/2014   History of Present Illness  Patient is a 64 yo male admitted 09/21/14 with one week history of gait instability and weakness on Rt side, UE>LE.  Patient with NIHSS of 4.  MRI - subacute infarct Lt basal ganglia.  PMH:  HTN uncontrolled, substance abuse, bil knee pain/injury - surgery Lt knee  Clinical Impression  Patient presents with problems listed below.  Will benefit from acute PT to maximize independence prior to discharge.  Patient was independent with mobility and ADL's pta.  Today required mod assist for mobility/gait.  Recommend Inpatient Rehab consult to facilitate optimal functional mobility prior to return home with sister.    Follow Up Recommendations CIR;Supervision/Assistance - 24 hour    Equipment Recommendations  Rolling walker with 5" wheels;3in1 (PT)    Recommendations for Other Services Rehab consult     Precautions / Restrictions Precautions Precautions: Fall Required Braces or Orthoses: Other Brace/Splint Other Brace/Splint: Patient wears bil knee braces Restrictions Weight Bearing Restrictions: No      Mobility  Bed Mobility Overal bed mobility: Needs Assistance Bed Mobility: Supine to Sit;Sit to Supine     Supine to sit: Min assist Sit to supine: Min guard   General bed mobility comments: Verbal cues for technique.  Assist to raise trunk to sitting position.  Once upright, patient with good balance.  Able to don knee braces on bil knees.  Assist for safety only to return to supine.  Transfers Overall transfer level: Needs assistance Equipment used: Quad cane Transfers: Sit to/from Stand Sit to Stand: Mod assist         General transfer comment: Patient using rocking motion to move to standing.  Required 2 attempts.  On second attempt, required mod assist to power up to standing.  Min assist to min guard for static standing  balance.  Ambulation/Gait Ambulation/Gait assistance: Min assist Ambulation Distance (Feet): 24 Feet Assistive device: Quad cane Gait Pattern/deviations: Step-to pattern;Decreased stride length;Staggering left;Staggering right;Trunk flexed;Wide base of support Gait velocity: Decreased Gait velocity interpretation: Below normal speed for age/gender General Gait Details: Patient with unsteady gait.  Difficulty maneuvering quad cane with Rt UE.  Patient staggering to both sides, requiring assist to maintain balance.  Will try RW at next session.  Stairs            Wheelchair Mobility    Modified Rankin (Stroke Patients Only) Modified Rankin (Stroke Patients Only) Pre-Morbid Rankin Score: No significant disability (Per patient report) Modified Rankin: Moderately severe disability     Balance Overall balance assessment: Needs assistance         Standing balance support: Single extremity supported;During functional activity Standing balance-Leahy Scale: Poor                               Pertinent Vitals/Pain Pain Assessment: No/denies pain    Home Living Family/patient expects to be discharged to:: Private residence Living Arrangements: Other relatives (Sister) Available Help at Discharge: Family;Available 24 hours/day Type of Home: Apartment Home Access: Stairs to enter Entrance Stairs-Rails: Right Entrance Stairs-Number of Steps: flight Home Layout: One level Home Equipment: Cane - quad      Prior Function Level of Independence: Independent with assistive device(s)         Comments: Uses quad cane.  Reports he had been going to gym to use weights, and walking.  Hand Dominance   Dominant Hand: Right    Extremity/Trunk Assessment   Upper Extremity Assessment: Defer to OT evaluation (Difficulty controlling cane in Rt hand with gait)           Lower Extremity Assessment: RLE deficits/detail;LLE deficits/detail RLE Deficits / Details:  Strength grossly 4+/5.  Varus knee position in standing.  Wears knee brace. LLE Deficits / Details: Strength grossly 4+/5.  Wears knee brace - less supportive than RLE brace.  Knee in varus position during gait.     Communication   Communication: No difficulties  Cognition Arousal/Alertness: Awake/alert Behavior During Therapy: WFL for tasks assessed/performed Overall Cognitive Status: Within Functional Limits for tasks assessed (Decreased safety awareness at times)                      General Comments      Exercises        Assessment/Plan    PT Assessment Patient needs continued PT services  PT Diagnosis Difficulty walking;Abnormality of gait;Hemiplegia dominant side   PT Problem List Decreased strength;Decreased activity tolerance;Decreased balance;Decreased mobility;Decreased coordination;Decreased knowledge of use of DME;Decreased safety awareness  PT Treatment Interventions DME instruction;Gait training;Stair training;Functional mobility training;Therapeutic activities;Balance training;Patient/family education   PT Goals (Current goals can be found in the Care Plan section) Acute Rehab PT Goals Patient Stated Goal: To walk better PT Goal Formulation: With patient Time For Goal Achievement: 09/29/14 Potential to Achieve Goals: Good    Frequency Min 4X/week   Barriers to discharge Inaccessible home environment Flight of stairs to enter apartment    Co-evaluation               End of Session Equipment Utilized During Treatment: Gait belt (Bil knee braces) Activity Tolerance: Patient tolerated treatment well Patient left: in bed;with call bell/phone within reach;with bed alarm set Nurse Communication: Mobility status         Time: 8185-6314 PT Time Calculation (min) (ACUTE ONLY): 20 min   Charges:   PT Evaluation $Initial PT Evaluation Tier I: 1 Procedure     PT G CodesDespina Pole Oct 14, 2014, 5:10 PM Carita Pian. Sanjuana Kava,  [place] Pager [phone]

## 2014-09-22 NOTE — Consult Note (Signed)
Admission H&P    Chief Complaint: New onset right-sided weakness.  HPI: [name] is an 64 y.o. male with a history of knee injury and arthritis, as well as hypertension, untreated, presenting with new onset weakness involving his right side. He was last known well about 10-12 days ago. He did not seek medical attention until yesterday. 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 Tiny remote left pontine and left basal ganglia infarctions were also noted. Patient has had difficulty with walking as well as use of his right upper extremity. Speech is been slightly slurred. He said no swallowing difficulty. He has not been on antiplatelet therapy. He was admitted for evaluation and management of subacute stroke. NIH stroke score was 4.  LSN: 10-12 days ago tPA Given: No: Beyond time window for treatment consideration mRankin:  Past Medical History  Diagnosis Date  . Knee injury     right knee injury   . Hypertension     Past Surgical History  Procedure Laterality Date  . No past surgeries      Family History  Problem Relation Age of Onset  . Diabetes Mellitus II Mother   . Stroke Mother    Social History:  reports that he has never smoked. He does not have any smokeless tobacco history on file. He reports that he does not drink alcohol. His drug history is not on file.  Allergies: No Known Allergies  Medications Prior to Admission  Medication Sig Dispense Refill  . Multiple Vitamins-Minerals (ONE-A-DAY MENS 50+ ADVANTAGE PO) Take 1 tablet by mouth daily.    [name] OVER THE COUNTER MEDICATION Take 4 tablets by mouth daily. truheart      ROS: History obtained from sibling and the patient  General ROS: negative for - chills, fatigue, fever, night sweats, weight gain or weight loss Psychological ROS: negative for - behavioral disorder, hallucinations, memory difficulties, mood swings or suicidal  ideation Ophthalmic ROS: negative for - blurry vision, double vision, eye pain or loss of vision ENT ROS: negative for - epistaxis, nasal discharge, oral lesions, sore throat, tinnitus or vertigo Allergy and Immunology ROS: negative for - hives or itchy/watery eyes Hematological and Lymphatic ROS: negative for - bleeding problems, bruising or swollen lymph nodes Endocrine ROS: negative for - galactorrhea, hair pattern changes, polydipsia/polyuria or temperature intolerance Respiratory ROS: negative for - cough, hemoptysis, shortness of breath or wheezing Cardiovascular ROS: negative for - chest pain, dyspnea on exertion, edema or irregular heartbeat Gastrointestinal ROS: negative for - abdominal pain, diarrhea, hematemesis, nausea/vomiting or stool incontinence Genito-Urinary ROS: negative for - dysuria, hematuria, incontinence or urinary frequency/urgency Musculoskeletal ROS: Chronic knee pain with arthritis and history of knee injury Neurological ROS: as noted in HPI Dermatological ROS: negative for rash and skin lesion changes  Physical Examination: Blood pressure 161/91, pulse 65, temperature 98.6 F (37 C), temperature source Oral, resp. rate 20, height _0  (1.778 m), weight 78.926 kg (174 lb), SpO2 99 %.  HEENT-  Normocephalic, no lesions, without obvious abnormality.  Normal external eye and conjunctiva.  Normal TM's bilaterally.  Normal auditory canals and external ears. Normal external nose, mucus membranes and septum.  Normal pharynx. Neck- supple with no masses, nodes, nodules or enlargement. Cardiovascular - regular rate and rhythm, S1, S2 normal, no murmur, click, rub or gallop Lungs - chest clear, no wheezing, rales, normal symmetric air entry Abdomen - soft, non-tender; bowel sounds normal; no masses,  no organomegaly Extremities -  no edema and no skin discoloration   Neurologic Examination: Mental Status: Alert, oriented, thought content appropriate.  Speech fluent  without evidence of aphasia. Able to follow commands without difficulty. Cranial Nerves: II-Visual fields were normal. III/IV/VI-Pupils were equal and reacted normally to light. Extraocular movements were full and conjugate.    V/VII-no facial numbness; mild flattening of right nasolabial fold. VIII-normal. X-normal speech and symmetrical palatal movement. XI: trapezius strength/neck flexion strength normal bilaterally XII-midline tongue extension with normal strength. Motor: Mild drift of right upper and lower extremities; normal strength of left extremities; normal muscle tone throughout. Sensory: Normal throughout. Deep Tendon Reflexes: 2+ and symmetric. Plantars: Flexor bilaterally Cerebellar: Slightly impaired coordination involving right upper extremity. Carotid auscultation: Normal  Results for orders placed or performed during the hospital encounter of 09/21/14 (from the past 48 hour(s))  Protime-INR     Status: None   Collection Time: 09/21/14  3:53 PM  Result Value Ref Range   Prothrombin Time 14.1 11.6 - 15.2 seconds   INR 1.09 0.00 - 1.49  APTT     Status: None   Collection Time: 09/21/14  3:53 PM  Result Value Ref Range   aPTT 37 24 - 37 seconds    Comment:        IF BASELINE aPTT IS ELEVATED, SUGGEST PATIENT RISK ASSESSMENT BE USED TO DETERMINE APPROPRIATE ANTICOAGULANT THERAPY.   CBC     Status: None   Collection Time: 09/21/14  3:53 PM  Result Value Ref Range   WBC 5.2 4.0 - 10.5 K/uL   RBC 4.95 4.22 - 5.81 MIL/uL   Hemoglobin 15.3 13.0 - 17.0 g/dL   HCT 45.4 39.0 - 52.0 %   MCV 91.7 78.0 - 100.0 fL   MCH 30.9 26.0 - 34.0 pg   MCHC 33.7 30.0 - 36.0 g/dL   RDW 12.9 11.5 - 15.5 %   Platelets 175 150 - 400 K/uL  Differential     Status: None   Collection Time: 09/21/14  3:53 PM  Result Value Ref Range   Neutrophils Relative % 65 43 - 77 %   Neutro Abs 3.4 1.7 - 7.7 K/uL   Lymphocytes Relative 22 12 - 46 %   Lymphs Abs 1.1 0.7 - 4.0 K/uL   Monocytes  Relative 9 3 - 12 %   Monocytes Absolute 0.4 0.1 - 1.0 K/uL   Eosinophils Relative 3 0 - 5 %   Eosinophils Absolute 0.2 0.0 - 0.7 K/uL   Basophils Relative 1 0 - 1 %   Basophils Absolute 0.0 0.0 - 0.1 K/uL  Comprehensive metabolic panel     Status: Abnormal   Collection Time: 09/21/14  3:53 PM  Result Value Ref Range   Sodium 135 135 - 145 mmol/L   Potassium 3.6 3.5 - 5.1 mmol/L   Chloride 102 96 - 112 mmol/L   CO2 27 19 - 32 mmol/L   Glucose, Bld 92 70 - 99 mg/dL   BUN 13 6 - 23 mg/dL   Creatinine, Ser 0.98 0.50 - 1.35 mg/dL   Calcium 8.7 8.4 - 10.5 mg/dL   Total Protein 7.2 6.0 - 8.3 g/dL   Albumin 3.7 3.5 - 5.2 g/dL   AST 56 (H) 0 - 37 U/L   ALT 45 0 - 53 U/L   Alkaline Phosphatase 62 39 - 117 U/L   Total Bilirubin 0.9 0.3 - 1.2 mg/dL   GFR calc non Af Amer 84 (L) >90 mL/min   GFR calc Af Amer >  90 >90 mL/min    Comment: (NOTE) The eGFR has been calculated using the CKD EPI equation. This calculation has not been validated in all clinical situations. eGFR's persistently <90 mL/min signify possible Chronic Kidney Disease.    Anion gap 6 5 - 15  Troponin I     Status: None   Collection Time: 09/21/14  3:53 PM  Result Value Ref Range   Troponin I <0.03 <0.031 ng/mL    Comment:        NO INDICATION OF MYOCARDIAL INJURY.   Ethanol     Status: None   Collection Time: 09/21/14  4:15 PM  Result Value Ref Range   Alcohol, Ethyl (B) <5 0 - 9 mg/dL    Comment:        LOWEST DETECTABLE LIMIT FOR SERUM ALCOHOL IS 11 mg/dL FOR MEDICAL PURPOSES ONLY   Urine Drug Screen     Status: Abnormal   Collection Time: 09/21/14  4:41 PM  Result Value Ref Range   Opiates NONE DETECTED NONE DETECTED   Cocaine NONE DETECTED NONE DETECTED   Benzodiazepines NONE DETECTED NONE DETECTED   Amphetamines NONE DETECTED NONE DETECTED   Tetrahydrocannabinol POSITIVE (A) NONE DETECTED   Barbiturates NONE DETECTED NONE DETECTED    Comment:        DRUG SCREEN FOR MEDICAL PURPOSES ONLY.  IF  CONFIRMATION IS NEEDED FOR ANY PURPOSE, NOTIFY LAB WITHIN 5 DAYS.        LOWEST DETECTABLE LIMITS FOR URINE DRUG SCREEN Drug Class       Cutoff (ng/mL) Amphetamine      1000 Barbiturate      200 Benzodiazepine   338 Tricyclics       329 Opiates          300 Cocaine          300 THC              50   Urinalysis, Routine w reflex microscopic     Status: Abnormal   Collection Time: 09/21/14  4:41 PM  Result Value Ref Range   Color, Urine YELLOW YELLOW   APPearance CLEAR CLEAR   Specific Gravity, Urine 1.019 1.005 - 1.030   pH 5.5 5.0 - 8.0   Glucose, UA NEGATIVE NEGATIVE mg/dL   Hgb urine dipstick NEGATIVE NEGATIVE   Bilirubin Urine NEGATIVE NEGATIVE   Ketones, ur 15 (A) NEGATIVE mg/dL   Protein, ur NEGATIVE NEGATIVE mg/dL   Urobilinogen, UA 0.2 0.0 - 1.0 mg/dL   Nitrite NEGATIVE NEGATIVE   Leukocytes, UA TRACE (A) NEGATIVE  Urine microscopic-add on     Status: Abnormal   Collection Time: 09/21/14  4:41 PM  Result Value Ref Range   Squamous Epithelial / LPF RARE RARE   WBC, UA 0-2 <3 WBC/hpf   Bacteria, UA FEW (A) RARE   Urine-Other MUCOUS PRESENT   CBC     Status: None   Collection Time: 09/22/14 12:30 AM  Result Value Ref Range   WBC 5.4 4.0 - 10.5 K/uL   RBC 4.41 4.22 - 5.81 MIL/uL   Hemoglobin 13.7 13.0 - 17.0 g/dL   HCT 41.4 39.0 - 52.0 %   MCV 93.9 78.0 - 100.0 fL   MCH 31.1 26.0 - 34.0 pg   MCHC 33.1 30.0 - 36.0 g/dL   RDW 13.2 11.5 - 15.5 %   Platelets 152 150 - 400 K/uL  Creatinine, serum     Status: Abnormal   Collection Time: 09/22/14 12:30 AM  Result Value Ref Range   Creatinine, Ser 1.19 0.50 - 1.35 mg/dL   GFR calc non Af Amer 62 (L) >90 mL/min   GFR calc Af Amer 72 (L) >90 mL/min    Comment: (NOTE) The eGFR has been calculated using the CKD EPI equation. This calculation has not been validated in all clinical situations. eGFR's persistently <90 mL/min signify possible Chronic Kidney Disease.   CBC with Differential/Platelet     Status: Abnormal    Collection Time: 09/22/14  7:10 AM  Result Value Ref Range   WBC 4.2 4.0 - 10.5 K/uL   RBC 4.33 4.22 - 5.81 MIL/uL   Hemoglobin 13.6 13.0 - 17.0 g/dL   HCT 40.8 39.0 - 52.0 %   MCV 94.2 78.0 - 100.0 fL   MCH 31.4 26.0 - 34.0 pg   MCHC 33.3 30.0 - 36.0 g/dL   RDW 13.2 11.5 - 15.5 %   Platelets 143 (L) 150 - 400 K/uL   Neutrophils Relative % 49 43 - 77 %   Neutro Abs 2.1 1.7 - 7.7 K/uL   Lymphocytes Relative 34 12 - 46 %   Lymphs Abs 1.4 0.7 - 4.0 K/uL   Monocytes Relative 11 3 - 12 %   Monocytes Absolute 0.5 0.1 - 1.0 K/uL   Eosinophils Relative 5 0 - 5 %   Eosinophils Absolute 0.2 0.0 - 0.7 K/uL   Basophils Relative 1 0 - 1 %   Basophils Absolute 0.0 0.0 - 0.1 K/uL   Ct Head Wo Contrast  09/21/2014   CLINICAL DATA:  Elevated blood pressure. Right-sided weakness for 1 week. History of hypertension.  EXAM: CT HEAD WITHOUT CONTRAST  TECHNIQUE: Contiguous axial images were obtained from the base of the skull through the vertex without intravenous contrast.  COMPARISON:  None.  FINDINGS: Ventricles are normal in size and configuration.  There are no parenchymal masses or mass effect.  There is an area of hypoattenuation in the left centrum semiovale, likely due to chronic microvascular ischemic change. It could, potentially, reflect a subacute white matter infarction.  There other patchy areas of white matter hypoattenuation consistent with mild to moderate chronic microvascular ischemic change.  There is no evidence of a transcortical infarct.  There are no extra-axial masses or abnormal fluid collections.  There is no intracranial hemorrhage.  Visualized sinuses and mastoid air cells are clear. No skull lesion.  IMPRESSION: 1. Possible subacute white matter infarct along the left centrum semiovale. This could be further assessed with brain MRI if desired clinically. 2. No other evidence of a recent infarct. No intracranial hemorrhage. 3. Mild moderate chronic microvascular ischemic change.    Electronically Signed   By: Lajean Manes M.D.   On: 09/21/2014 17:25   Mr Brain Wo Contrast  09/22/2014   CLINICAL DATA:  Initial evaluation for acute onset right-sided weakness that began on Monday. Evaluate for stroke.  EXAM: MRI HEAD WITHOUT CONTRAST  MRA HEAD WITHOUT CONTRAST  TECHNIQUE: Multiplanar, multiecho pulse sequences of the brain and surrounding structures were obtained without intravenous contrast. Angiographic images of the head were obtained using MRA technique without contrast.  COMPARISON:  Prior CT earlier the same day.  FINDINGS: MRI HEAD FINDINGS  Mild diffuse prominence of the CSF containing spaces is compatible with generalized cerebral atrophy. Patchy and confluent T2/FLAIR hyperintensity within the periventricular and deep white matter both cerebral hemispheres most consistent with moderate chronic small vessel ischemic disease.  Small remote left pontine infarct noted. Additional small remote  loop donor infarct within the posterior left lentiform nucleus. There is chronic blood products with the left basal ganglia infarct.  There is an area mildly increased signal intensity on DWI sequence located in the superior left basal ganglia extending into the periventricular white matter of the left corona radiata (series 4, image 30). This infarct is likely subacute nature, as the ADC signal abnormality has largely normalized. No associated hemorrhage or significant mass effect. No other infarct.  No mass lesion or midline shift. Ventricles are normal size without hydrocephalus. No extra-axial fluid collection.  Craniocervical junction is normal. Pituitary gland within normal limits.  No acute abnormality seen about the orbits.  Paranasal sinuses are clear.  No mastoid effusion.  Bone marrow signal intensity within normal limits. Visualized upper cervical spine unremarkable. Scalp soft tissues within normal limits.  MRA HEAD FINDINGS  ANTERIOR CIRCULATION:  The distal cervical segments of the  internal carotid arteries are widely patent with antegrade flow. The petrous, cavernous, and supra clinoid segments are well opacified without significant stenosis. A1 segments, anterior communicating artery, and anterior cerebral arteries widely patent. Median callosal branch noted. M1 segments widely patent without significant stenosis or occlusion. MCA bifurcation is normal. Distal MCA branches within normal limits.  POSTERIOR CIRCULATION:  Left vertebral artery is dominant and widely patent to the vertebrobasilar junction. Diminutive right vertebral artery divides at the right posterior inferior cerebellar artery, with a small hypoplastic branch ascending torus the vertebrobasilar junction. The right posterior inferior cerebral artery is patent. The left posterior inferior cerebral artery is not visualized. Basilar artery widely patent. Anterior inferior cerebral arteries are patent bilaterally. The left AICA is dominant. Superior cerebellar arteries widely patent. P1 and P2 segments widely patent.  No aneurysm.  IMPRESSION: MRI HEAD IMPRESSION:  1. Subacute ischemic infarct involving the superior left basal ganglia with cephalad extension into the periventricular white matter of the left corona radiata. No associated hemorrhage or significant mass effect. 2. Tiny remote left pontine and left basal ganglia lacunar infarcts. 3. Atrophy with moderate chronic microvascular ischemic disease.  MRA HEAD IMPRESSION:  Normal intracranial MRA without evidence of hemodynamically significant stenosis or proximal branch occlusion.   Electronically Signed   By: Jeannine Boga M.D.   On: 09/22/2014 07:10   Mr Jodene Nam Head/brain Wo Cm  09/22/2014   CLINICAL DATA:  Initial evaluation for acute onset right-sided weakness that began on Monday. Evaluate for stroke.  EXAM: MRI HEAD WITHOUT CONTRAST  MRA HEAD WITHOUT CONTRAST  TECHNIQUE: Multiplanar, multiecho pulse sequences of the brain and surrounding structures were  obtained without intravenous contrast. Angiographic images of the head were obtained using MRA technique without contrast.  COMPARISON:  Prior CT earlier the same day.  FINDINGS: MRI HEAD FINDINGS  Mild diffuse prominence of the CSF containing spaces is compatible with generalized cerebral atrophy. Patchy and confluent T2/FLAIR hyperintensity within the periventricular and deep white matter both cerebral hemispheres most consistent with moderate chronic small vessel ischemic disease.  Small remote left pontine infarct noted. Additional small remote loop donor infarct within the posterior left lentiform nucleus. There is chronic blood products with the left basal ganglia infarct.  There is an area mildly increased signal intensity on DWI sequence located in the superior left basal ganglia extending into the periventricular white matter of the left corona radiata (series 4, image 30). This infarct is likely subacute nature, as the ADC signal abnormality has largely normalized. No associated hemorrhage or significant mass effect. No other infarct.  No mass lesion  or midline shift. Ventricles are normal size without hydrocephalus. No extra-axial fluid collection.  Craniocervical junction is normal. Pituitary gland within normal limits.  No acute abnormality seen about the orbits.  Paranasal sinuses are clear.  No mastoid effusion.  Bone marrow signal intensity within normal limits. Visualized upper cervical spine unremarkable. Scalp soft tissues within normal limits.  MRA HEAD FINDINGS  ANTERIOR CIRCULATION:  The distal cervical segments of the internal carotid arteries are widely patent with antegrade flow. The petrous, cavernous, and supra clinoid segments are well opacified without significant stenosis. A1 segments, anterior communicating artery, and anterior cerebral arteries widely patent. Median callosal branch noted. M1 segments widely patent without significant stenosis or occlusion. MCA bifurcation is normal.  Distal MCA branches within normal limits.  POSTERIOR CIRCULATION:  Left vertebral artery is dominant and widely patent to the vertebrobasilar junction. Diminutive right vertebral artery divides at the right posterior inferior cerebellar artery, with a small hypoplastic branch ascending torus the vertebrobasilar junction. The right posterior inferior cerebral artery is patent. The left posterior inferior cerebral artery is not visualized. Basilar artery widely patent. Anterior inferior cerebral arteries are patent bilaterally. The left AICA is dominant. Superior cerebellar arteries widely patent. P1 and P2 segments widely patent.  No aneurysm.  IMPRESSION: MRI HEAD IMPRESSION:  1. Subacute ischemic infarct involving the superior left basal ganglia with cephalad extension into the periventricular white matter of the left corona radiata. No associated hemorrhage or significant mass effect. 2. Tiny remote left pontine and left basal ganglia lacunar infarcts. 3. Atrophy with moderate chronic microvascular ischemic disease.  MRA HEAD IMPRESSION:  Normal intracranial MRA without evidence of hemodynamically significant stenosis or proximal branch occlusion.   Electronically Signed   By: Jeannine Boga M.D.   On: 09/22/2014 07:10    Assessment: 64 y.o. male with untreated hypertension presenting with subacute left basal ganglia and corona radiata ischemic infarction.  Stroke Risk Factors - family history and hypertension  Plan: 1. HgbA1c, fasting lipid panel 2. MRI, MRA  of the brain without contrast 3. PT consult, OT consult, Speech consult 4. Echocardiogram 5. Carotid dopplers 6. Prophylactic therapy-Antiplatelet med: Aspirin  7. Risk factor modification 8. Telemetry monitoring  C.R. Nicole Kindred, MD [facility] [phone]  09/22/2014, 9:02 AM

## 2014-09-22 NOTE — Progress Notes (Signed)
Utilization Review Completed.   Kerry Chisolm, RN, BSN Nurse Case Manager  

## 2014-09-22 NOTE — Social Work (Signed)
CSW met with patient who denied substance use and denied need for community resources for treatment.  No further CSW needs at this time.   Christene Lye MSW, [place]

## 2014-09-23 ENCOUNTER — Inpatient Hospital Stay (HOSPITAL_COMMUNITY): Payer: Medicare HMO

## 2014-09-23 ENCOUNTER — Encounter (HOSPITAL_COMMUNITY): Payer: Self-pay | Admitting: Physical Medicine and Rehabilitation

## 2014-09-23 DIAGNOSIS — I6350 Cerebral infarction due to unspecified occlusion or stenosis of unspecified cerebral artery: Secondary | ICD-10-CM

## 2014-09-23 DIAGNOSIS — I6789 Other cerebrovascular disease: Secondary | ICD-10-CM

## 2014-09-23 DIAGNOSIS — M174 Other bilateral secondary osteoarthritis of knee: Secondary | ICD-10-CM

## 2014-09-23 LAB — COMPREHENSIVE METABOLIC PANEL
ALT: 56 U/L — ABNORMAL HIGH (ref 0–53)
AST: 74 U/L — AB (ref 0–37)
Albumin: 3 g/dL — ABNORMAL LOW (ref 3.5–5.2)
Alkaline Phosphatase: 56 U/L (ref 39–117)
Anion gap: 5 (ref 5–15)
BILIRUBIN TOTAL: 0.8 mg/dL (ref 0.3–1.2)
BUN: 9 mg/dL (ref 6–23)
CO2: 30 mmol/L (ref 19–32)
Calcium: 8.7 mg/dL (ref 8.4–10.5)
Chloride: 105 mmol/L (ref 96–112)
Creatinine, Ser: 1.03 mg/dL (ref 0.50–1.35)
GFR calc Af Amer: 86 mL/min — ABNORMAL LOW (ref >90)
GFR calc non Af Amer: 74 mL/min — ABNORMAL LOW (ref >90)
Glucose, Bld: 85 mg/dL (ref 70–99)
Potassium: 3.4 mmol/L — ABNORMAL LOW (ref 3.5–5.1)
Sodium: 140 mmol/L (ref 135–145)
TOTAL PROTEIN: 6.1 g/dL (ref 6.0–8.3)

## 2014-09-23 LAB — HEMOGLOBIN A1C
HEMOGLOBIN A1C: 5.5 % (ref 4.8–5.6)
MEAN PLASMA GLUCOSE: 111 mg/dL

## 2014-09-23 LAB — CBC
HEMATOCRIT: 41.6 % (ref 39.0–52.0)
HEMOGLOBIN: 13.7 g/dL (ref 13.0–17.0)
MCH: 30.8 pg (ref 26.0–34.0)
MCHC: 32.9 g/dL (ref 30.0–36.0)
MCV: 93.5 fL (ref 78.0–100.0)
Platelets: 152 10*3/uL (ref 150–400)
RBC: 4.45 MIL/uL (ref 4.22–5.81)
RDW: 13.3 % (ref 11.5–15.5)
WBC: 3.9 10*3/uL — ABNORMAL LOW (ref 4.0–10.5)

## 2014-09-23 LAB — FOLATE RBC
FOLATE, RBC: 1339 ng/mL (ref >498)
Folate, Hemolysate: 563.8 ng/mL
Hematocrit: 42.1 % (ref 37.5–51.0)

## 2014-09-23 LAB — HIV ANTIBODY (ROUTINE TESTING W REFLEX): HIV Screen 4th Generation wRfx: NONREACTIVE

## 2014-09-23 MED ORDER — AMLODIPINE BESYLATE 10 MG PO TABS
10.0000 mg | ORAL_TABLET | Freq: Every day | ORAL | Status: DC
  Administered 2014-09-24 – 2014-09-25 (×2): 10 mg via ORAL
  Filled 2014-09-23 (×2): qty 1

## 2014-09-23 MED ORDER — METOPROLOL TARTRATE 25 MG PO TABS
25.0000 mg | ORAL_TABLET | Freq: Two times a day (BID) | ORAL | Status: DC
  Administered 2014-09-23 – 2014-09-24 (×2): 25 mg via ORAL
  Filled 2014-09-23 (×2): qty 1

## 2014-09-23 MED ORDER — POTASSIUM CHLORIDE CRYS ER 20 MEQ PO TBCR
20.0000 meq | EXTENDED_RELEASE_TABLET | Freq: Once | ORAL | Status: AC
Start: 2014-09-23 — End: 2014-09-23
  Administered 2014-09-23: 20 meq via ORAL
  Filled 2014-09-23: qty 1

## 2014-09-23 NOTE — Progress Notes (Signed)
VASCULAR LAB PRELIMINARY  PRELIMINARY  PRELIMINARY  PRELIMINARY  Carotid Dopplers completed.    Preliminary report:  1-39% ICA stenosis.  Vertebral artery flow is antegrade.   Inigo Lantigua, RVT 09/23/2014, 10:16 AM

## 2014-09-23 NOTE — Progress Notes (Signed)
PROGRESS NOTE  [name] TIW:[id number] DOB: 09-Apr-1951 DOA: 09/21/2014 PCP: Leola Brazil, MD   Brief history 64 year old male with a history of hypertension presented with one-week history of gait instability, difficulty walking, and right-sided weakness of his arms greater than his legs. The patient also had some slurred speech. In the emergency department, CT of the brain showed a subacute stroke. The patient was admitted for a full stroke workup. MRI of the brain revealed subacute left basal ganglia infarct with remote pontine and left basal ganglia infarcts.  Assessment/Plan: Subacute stroke left basal ganglia -As the patient's symptoms have been present for one week, and MRI suggests subacute infarct, start antihypertensive medications -MRI brain--subacute left infarct basal ganglia, remote pontine infarct -MRA brain--no hemodynamically significant stenosis -Hemoglobin A1c--5.5 -LDL--80 -Carotid duplex--no hemodynamically significant stenosis -Echocardiogram--EF 25-05%, grade 1 diastolic dysfunction, no WMA -PT/OT/ST-->CIR -Appreciate neurology consult -Continue aspirin Hypertension -increase amlodipine 10mg  -start low dose metoprolol Dyslipidemia -Start statin Marijuana use -Cessation discussed Thrombocytopenia -Serum B12--1339 -HIV--neg Transaminasemia -Hepatitis B surface antigen--neg -Hepatitis C antibody--neg -RUQ us--neg GB and neg liver -outpt followup Family Communication: Pt at beside Disposition Plan: CIR 09/24/14 if stable        Procedures/Studies: Dg Chest 2 View  09/22/2014   CLINICAL DATA:  Stroke protocol.  EXAM: CHEST  2 VIEW  COMPARISON:  None  FINDINGS: There is normal heart size. No pleural effusion or edema. No airspace consolidation or atelectasis noted. Calcified atherosclerotic plaque involves the thoracic aorta.  IMPRESSION: No active cardiopulmonary abnormalities.   Electronically Signed   By: Kerby Moors  M.D.   On: 09/22/2014 09:32   Ct Head Wo Contrast  09/21/2014   CLINICAL DATA:  Elevated blood pressure. Right-sided weakness for 1 week. History of hypertension.  EXAM: CT HEAD WITHOUT CONTRAST  TECHNIQUE: Contiguous axial images were obtained from the base of the skull through the vertex without intravenous contrast.  COMPARISON:  None.  FINDINGS: Ventricles are normal in size and configuration.  There are no parenchymal masses or mass effect.  There is an area of hypoattenuation in the left centrum semiovale, likely due to chronic microvascular ischemic change. It could, potentially, reflect a subacute white matter infarction.  There other patchy areas of white matter hypoattenuation consistent with mild to moderate chronic microvascular ischemic change.  There is no evidence of a transcortical infarct.  There are no extra-axial masses or abnormal fluid collections.  There is no intracranial hemorrhage.  Visualized sinuses and mastoid air cells are clear. No skull lesion.  IMPRESSION: 1. Possible subacute white matter infarct along the left centrum semiovale. This could be further assessed with brain MRI if desired clinically. 2. No other evidence of a recent infarct. No intracranial hemorrhage. 3. Mild moderate chronic microvascular ischemic change.   Electronically Signed   By: Lajean Manes M.D.   On: 09/21/2014 17:25   Mr Brain Wo Contrast  09/22/2014   CLINICAL DATA:  Initial evaluation for acute onset right-sided weakness that began on Monday. Evaluate for stroke.  EXAM: MRI HEAD WITHOUT CONTRAST  MRA HEAD WITHOUT CONTRAST  TECHNIQUE: Multiplanar, multiecho pulse sequences of the brain and surrounding structures were obtained without intravenous contrast. Angiographic images of the head were obtained using MRA technique without contrast.  COMPARISON:  Prior CT earlier the same day.  FINDINGS: MRI HEAD FINDINGS  Mild diffuse prominence of the CSF containing spaces is compatible with generalized  cerebral atrophy. Patchy and confluent T2/FLAIR hyperintensity  within the periventricular and deep white matter both cerebral hemispheres most consistent with moderate chronic small vessel ischemic disease.  Small remote left pontine infarct noted. Additional small remote loop donor infarct within the posterior left lentiform nucleus. There is chronic blood products with the left basal ganglia infarct.  There is an area mildly increased signal intensity on DWI sequence located in the superior left basal ganglia extending into the periventricular white matter of the left corona radiata (series 4, image 30). This infarct is likely subacute nature, as the ADC signal abnormality has largely normalized. No associated hemorrhage or significant mass effect. No other infarct.  No mass lesion or midline shift. Ventricles are normal size without hydrocephalus. No extra-axial fluid collection.  Craniocervical junction is normal. Pituitary gland within normal limits.  No acute abnormality seen about the orbits.  Paranasal sinuses are clear.  No mastoid effusion.  Bone marrow signal intensity within normal limits. Visualized upper cervical spine unremarkable. Scalp soft tissues within normal limits.  MRA HEAD FINDINGS  ANTERIOR CIRCULATION:  The distal cervical segments of the internal carotid arteries are widely patent with antegrade flow. The petrous, cavernous, and supra clinoid segments are well opacified without significant stenosis. A1 segments, anterior communicating artery, and anterior cerebral arteries widely patent. Median callosal branch noted. M1 segments widely patent without significant stenosis or occlusion. MCA bifurcation is normal. Distal MCA branches within normal limits.  POSTERIOR CIRCULATION:  Left vertebral artery is dominant and widely patent to the vertebrobasilar junction. Diminutive right vertebral artery divides at the right posterior inferior cerebellar artery, with a small hypoplastic branch  ascending torus the vertebrobasilar junction. The right posterior inferior cerebral artery is patent. The left posterior inferior cerebral artery is not visualized. Basilar artery widely patent. Anterior inferior cerebral arteries are patent bilaterally. The left AICA is dominant. Superior cerebellar arteries widely patent. P1 and P2 segments widely patent.  No aneurysm.  IMPRESSION: MRI HEAD IMPRESSION:  1. Subacute ischemic infarct involving the superior left basal ganglia with cephalad extension into the periventricular white matter of the left corona radiata. No associated hemorrhage or significant mass effect. 2. Tiny remote left pontine and left basal ganglia lacunar infarcts. 3. Atrophy with moderate chronic microvascular ischemic disease.  MRA HEAD IMPRESSION:  Normal intracranial MRA without evidence of hemodynamically significant stenosis or proximal branch occlusion.   Electronically Signed   By: Jeannine Boga M.D.   On: 09/22/2014 07:10   Mr Jodene Nam Head/brain Wo Cm  09/22/2014   CLINICAL DATA:  Initial evaluation for acute onset right-sided weakness that began on Monday. Evaluate for stroke.  EXAM: MRI HEAD WITHOUT CONTRAST  MRA HEAD WITHOUT CONTRAST  TECHNIQUE: Multiplanar, multiecho pulse sequences of the brain and surrounding structures were obtained without intravenous contrast. Angiographic images of the head were obtained using MRA technique without contrast.  COMPARISON:  Prior CT earlier the same day.  FINDINGS: MRI HEAD FINDINGS  Mild diffuse prominence of the CSF containing spaces is compatible with generalized cerebral atrophy. Patchy and confluent T2/FLAIR hyperintensity within the periventricular and deep white matter both cerebral hemispheres most consistent with moderate chronic small vessel ischemic disease.  Small remote left pontine infarct noted. Additional small remote loop donor infarct within the posterior left lentiform nucleus. There is chronic blood products with the left  basal ganglia infarct.  There is an area mildly increased signal intensity on DWI sequence located in the superior left basal ganglia extending into the periventricular white matter of the left corona radiata (series 4, image  30). This infarct is likely subacute nature, as the ADC signal abnormality has largely normalized. No associated hemorrhage or significant mass effect. No other infarct.  No mass lesion or midline shift. Ventricles are normal size without hydrocephalus. No extra-axial fluid collection.  Craniocervical junction is normal. Pituitary gland within normal limits.  No acute abnormality seen about the orbits.  Paranasal sinuses are clear.  No mastoid effusion.  Bone marrow signal intensity within normal limits. Visualized upper cervical spine unremarkable. Scalp soft tissues within normal limits.  MRA HEAD FINDINGS  ANTERIOR CIRCULATION:  The distal cervical segments of the internal carotid arteries are widely patent with antegrade flow. The petrous, cavernous, and supra clinoid segments are well opacified without significant stenosis. A1 segments, anterior communicating artery, and anterior cerebral arteries widely patent. Median callosal branch noted. M1 segments widely patent without significant stenosis or occlusion. MCA bifurcation is normal. Distal MCA branches within normal limits.  POSTERIOR CIRCULATION:  Left vertebral artery is dominant and widely patent to the vertebrobasilar junction. Diminutive right vertebral artery divides at the right posterior inferior cerebellar artery, with a small hypoplastic branch ascending torus the vertebrobasilar junction. The right posterior inferior cerebral artery is patent. The left posterior inferior cerebral artery is not visualized. Basilar artery widely patent. Anterior inferior cerebral arteries are patent bilaterally. The left AICA is dominant. Superior cerebellar arteries widely patent. P1 and P2 segments widely patent.  No aneurysm.  IMPRESSION:  MRI HEAD IMPRESSION:  1. Subacute ischemic infarct involving the superior left basal ganglia with cephalad extension into the periventricular white matter of the left corona radiata. No associated hemorrhage or significant mass effect. 2. Tiny remote left pontine and left basal ganglia lacunar infarcts. 3. Atrophy with moderate chronic microvascular ischemic disease.  MRA HEAD IMPRESSION:  Normal intracranial MRA without evidence of hemodynamically significant stenosis or proximal branch occlusion.   Electronically Signed   By: Jeannine Boga M.D.   On: 09/22/2014 07:10   US Abdomen Limited Ruq  09/23/2014   CLINICAL DATA:  Elevated liver transaminases.  EXAM: US ABDOMEN LIMITED - RIGHT UPPER QUADRANT  COMPARISON:  None.  FINDINGS: Gallbladder:  No gallstones or wall thickening visualized. No sonographic Murphy sign noted.  Common bile duct:  Diameter: 2 mm  Liver:  No focal lesion identified. Within normal limits in parenchymal echogenicity.  [place] right renal cyst is incidentally noted.  IMPRESSION: Normal gallbladder.  Normal liver.  No bile duct dilation.  [place] right renal cyst.   Electronically Signed   By: Lajean Manes M.D.   On: 09/23/2014 17:07         Subjective: Patient is feeling stronger. His right arm and right leg was stronger. Denies any headache, visual disturbance, fever, chills, chest pain, shortness breath, nausea, vomiting, diarrhea.  Objective: Filed Vitals:   09/23/14 0603 09/23/14 0955 09/23/14 1409 09/23/14 1716  BP: 202/130 171/95 182/92 192/98  Pulse: 81 70 66 67  Temp: 98.5 F (36.9 C) 97.9 F (36.6 C) 98.9 F (37.2 C) 99 F (37.2 C)  TempSrc: Oral Oral Oral Oral  Resp: 18 16 16 20   Height:      Weight:      SpO2: 100% 100% 100% 100%    Intake/Output Summary (Last 24 hours) at 09/23/14 1849 Last data filed at 09/23/14 0840  Gross per 24 hour  Intake    240 ml  Output    350 ml  Net   -110 ml   Weight change:  Exam:   General:  Pt is  alert, follows commands appropriately, not in acute distress  HEENT: No icterus, No thrush,[place]/AT  Cardiovascular: RRR, S1/S2, no rubs, no gallops  Respiratory: CTA bilaterally, no wheezing, no crackles, no rhonchi  Abdomen: Soft/+BS, non tender, non distended, no guarding  Extremities: No edema, No lymphangitis, No petechiae, No rashes, no synovitis  Data Reviewed: Basic Metabolic Panel:  Recent Labs Lab 09/21/14 1553 09/22/14 0030 09/22/14 0710 09/23/14 0407  NA 135  --  140 140  K 3.6  --  3.6 3.4*  CL 102  --  104 105  CO2 27  --  31 30  GLUCOSE 92  --  79 85  BUN 13  --  14 9  CREATININE 0.98 1.19 1.09 1.03  CALCIUM 8.7  --  8.8 8.7   Liver Function Tests:  Recent Labs Lab 09/21/14 1553 09/22/14 0710 09/23/14 0407  AST 56* 58* 74*  ALT 45 43 56*  ALKPHOS 62 57 56  BILITOT 0.9 0.9 0.8  PROT 7.2 6.0 6.1  ALBUMIN 3.7 3.0* 3.0*   No results for input(s): LIPASE, AMYLASE in the last 168 hours. No results for input(s): AMMONIA in the last 168 hours. CBC:  Recent Labs Lab 09/21/14 1553 09/22/14 0030 09/22/14 0710 09/22/14 1335 09/23/14 0407  WBC 5.2 5.4 4.2  --  3.9*  NEUTROABS 3.4  --  2.1  --   --   HGB 15.3 13.7 13.6  --  13.7  HCT 45.4 41.4 40.8 42.1 41.6  MCV 91.7 93.9 94.2  --  93.5  PLT 175 152 143*  --  152   Cardiac Enzymes:  Recent Labs Lab 09/21/14 1553  TROPONINI <0.03   BNP: Invalid input(s): POCBNP CBG: No results for input(s): GLUCAP in the last 168 hours.  No results found for this or any previous visit (from the past 240 hour(s)).   Scheduled Meds: . amLODipine  5 mg Oral Daily  . aspirin  300 mg Rectal Daily   Or  . aspirin  325 mg Oral Daily  . atorvastatin  10 mg Oral q1800  . enoxaparin (LOVENOX) injection  40 mg Subcutaneous QHS   Continuous Infusions:    Braylin Formby, DO  [facility] Pager [phone]  If 7PM-7AM, please contact night-coverage [email or web address] Password [facility] 09/23/2014, 6:49 PM   LOS: 2  days

## 2014-09-23 NOTE — Progress Notes (Signed)
Physical Therapy Treatment Patient Details Name: [name] MRN: [id number] DOB: [birth year] Today's Date: 09/23/2014    History of Present Illness Patient is a 64 yo male admitted 09/21/14 with one week history of gait instability and weakness on Rt side, UE>LE.  Patient with NIHSS of 4.  MRI - subacute infarct Lt basal ganglia.  PMH:  HTN uncontrolled, substance abuse, bil knee pain/injury - surgery Lt knee    PT Comments    Patient progressing well with mobility. Continues to exhibit balance deficits requiring use of AD for stability. Staggering to right/left noted with use of quad cane. Balance improved with use of RW for support however pt still requires assist for safety and to prevent falls. Continues to be appropriate for CIR to return to functional independence.   Follow Up Recommendations  CIR;Supervision/Assistance - 24 hour     Equipment Recommendations  Rolling walker with 5" wheels;3in1 (PT)    Recommendations for Other Services       Precautions / Restrictions Precautions Precautions: Fall Required Braces or Orthoses: Other Brace/Splint Other Brace/Splint: Patient wears bil knee braces, "I've been wearing them for a long time, had problems with my knees" Restrictions Weight Bearing Restrictions: No    Mobility  Bed Mobility Overal bed mobility: Needs Assistance Bed Mobility: Supine to Sit;Sit to Supine     Supine to sit: Min guard;HOB elevated Sit to supine: Min guard;HOB elevated   General bed mobility comments: Use of rails for support.  Transfers Overall transfer level: Needs assistance Equipment used: None Transfers: Sit to/from Stand Sit to Stand: Min assist         General transfer comment: Use of momentum to stand. Cues for anterior translation and hand placement to assist with standing technique. LOB x2 to the left with impulsive standing.   Ambulation/Gait Ambulation/Gait assistance: Min assist Ambulation Distance (Feet): 75 Feet  (+75') Assistive device: Quad cane;Rolling walker (2 wheeled) Gait Pattern/deviations: Step-through pattern;Decreased stride length;Staggering left;Staggering right;Wide base of support Gait velocity: Decreased   General Gait Details: Ambulated with quad cane - staggering to right/left. Difficulty obtaining gait pattern with quad cane progressed to use of rail for support - Min A for stability. Ambulated with RW and balance improved however required cues for RW proximity and negotiation. Difficulty with turns.   Stairs            Wheelchair Mobility    Modified Rankin (Stroke Patients Only) Modified Rankin (Stroke Patients Only) Pre-Morbid Rankin Score: No significant disability Modified Rankin: Moderately severe disability     Balance Overall balance assessment: Needs assistance Sitting-balance support: Feet supported;No upper extremity supported Sitting balance-Leahy Scale: Good     Standing balance support: During functional activity Standing balance-Leahy Scale: Fair Standing balance comment: Able to perform static standing for short periods without UE support, requires UE support for ambulation.                    Cognition Arousal/Alertness: Awake/alert Behavior During Therapy: WFL for tasks assessed/performed Overall Cognitive Status: Within Functional Limits for tasks assessed                      Exercises Other Exercises Other Exercises: sit to stand x6 from EOB with cues for slow ascent/descent emphasizing eccentric quad control    General Comments        Pertinent Vitals/Pain Pain Assessment: No/denies pain    Home Living Family/patient expects to be discharged to:: Private residence Living Arrangements: Other relatives (  sister) Available Help at Discharge: Family;Available 24 hours/day Type of Home: Apartment Home Access: Stairs to enter Entrance Stairs-Rails: Left (going up on the left) Home Layout: One level Home Equipment:  Cane - quad      Prior Function Level of Independence: Independent with assistive device(s)      Comments: Uses quad cane.  Reports he had been going to gym to use weights, and walking.   PT Goals (current goals can now be found in the care plan section) Acute Rehab PT Goals Patient Stated Goal: go to rehab Progress towards PT goals: Progressing toward goals    Frequency  Min 4X/week    PT Plan Current plan remains appropriate    Co-evaluation             End of Session Equipment Utilized During Treatment: Gait belt Activity Tolerance: Patient tolerated treatment well Patient left: in bed;with call bell/phone within reach;with bed alarm set;with family/visitor present     Time: 3888-7579 PT Time Calculation (min) (ACUTE ONLY): 24 min  Charges:  $Gait Training: 23-37 mins                    G CodesCandy Sledge A 2014-10-01, 12:16 PM Candy Sledge, [place], DPT [phone]

## 2014-09-23 NOTE — Consult Note (Signed)
Physical Medicine and Rehabilitation Consult  Reason for Consult: Right sided weakness Referring Physician: Dr. Carles Collet   HPI: [name] is a 64 y.o. RH-male with history OA, glaucoma, untreated HTN who was admitted on 09/21/14 with new onset of right sided weakness with difficulty walking for about a week PTA. UDS positive for THC. 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 Work up ongoing and neurology recommends ASA for secondary stroke prevention. PT/OT evaluations completed today and CIR recommended by MD and Rehab team.    Review of Systems  HENT: Negative for hearing loss.   Eyes: Positive for blurred vision (has bilateral cataracts). Negative for double vision.  Respiratory: Positive for shortness of breath (with exertion). Negative for cough.   Cardiovascular: Negative for chest pain and palpitations.  Gastrointestinal: Positive for heartburn and diarrhea. Negative for nausea and vomiting.  Genitourinary: Negative for dysuria and urgency.  Musculoskeletal: Positive for myalgias and joint pain (bilateral knee pain).  Neurological: Positive for sensory change and focal weakness. Negative for headaches.  Psychiatric/Behavioral: Positive for memory loss (per sister). The patient does not have insomnia.       Past Medical History  Diagnosis Date  . Knee injury     right knee injury   . Hypertension   . Glaucoma   . Cataracts, bilateral     Past Surgical History  Procedure Laterality Date  . No past surgeries      Family History  Problem Relation Age of Onset  . Diabetes Mellitus II Mother   . Stroke Mother   . Cancer Mother   . Stuttering      Social History:  Lives with sister. Disabled since 65's due to injury. Moved to [place] 2 years ago and in process of being set up  with Dr. Katherine Roan. He reports that he quit smoking in 1984.  He does not have any smokeless tobacco history on file. He reports that he does not drink alcohol--quit in '83.  His drug history is not on file.    Allergies: No Known Allergies    Medications Prior to Admission  Medication Sig Dispense Refill  . Multiple Vitamins-Minerals (ONE-A-DAY MENS 50+ ADVANTAGE PO) Take 1 tablet by mouth daily.    [name] OVER THE COUNTER MEDICATION Take 4 tablets by mouth daily. truheart      Home: Home Living Family/patient expects to be discharged to:: Private residence Living Arrangements: Other relatives (sister) Available Help at Discharge: Family, Available 24 hours/day Type of Home: Apartment Home Access: Stairs to enter [employer] of Steps: flight Entrance Stairs-Rails: Left (going up on the left) Home Layout: One level Home Equipment: Cane - quad  Functional History: Prior Function Level of Independence: Independent with assistive device(s) Comments: Uses quad cane.  Reports he had been going to gym to use weights, and walking. Functional Status:  Mobility: Bed Mobility Overal bed mobility: Needs Assistance Bed Mobility: Supine to Sit, Sit to Supine Supine to sit: Min assist Sit to supine: Min guard General bed mobility comments: Patient seatede EOB upon OT entering room Transfers Overall transfer level: Needs assistance Equipment used: Quad cane Transfers: Sit to/from Stand Sit to Stand: Min assist General transfer comment: Patient using rocking motion to move to standing.  Required 2 attempts.  On second attempt, required mod assist to power up to standing.  Min assist to min guard for static standing balance. Ambulation/Gait Ambulation/Gait assistance: Min assist Ambulation Distance (Feet): 24 Feet Assistive device: Quad cane Gait Pattern/deviations: Step-to pattern, Decreased stride length, Staggering left, Staggering right, Trunk flexed, Wide base of support Gait  velocity: Decreased Gait velocity interpretation: Below normal speed for age/gender General Gait Details: Patient with unsteady gait.  Difficulty maneuvering quad cane with Rt UE.  Patient staggering to both sides, requiring assist to maintain balance.  Will try RW at next session.    ADL: ADL Overall ADL's : Needs assistance/impaired Eating/Feeding: Set up Grooming: Min guard, Standing Upper Body Bathing: Minimal assitance, Sitting Lower Body Bathing: Minimal assistance, Sit to/from stand, Cueing for safety Upper Body Dressing : Minimal assistance, Sitting Lower Body Dressing: Minimal assistance, Sit to/from stand, Cueing for safety Toilet Transfer: Minimal assistance, Grab bars, Ambulation Toilet Transfer Details (indicate cue type and reason): using quad cane Toileting- Clothing Manipulation and Hygiene: Sit to/from stand, Min guard Functional mobility during ADLs: Minimal assistance, Cueing for safety (using quad cane) General ADL Comments: Patient able to bring BLEs to self for LB ADLs. Patient with increased vargus during standing and functional ambulation. During functional ambulation with quad cane, patient tended to furniture walk and was very unsteady on feet. RUE uncoordinated during functional activity. According to patient he was getting around a lot better prior to this CVA.   Cognition: Cognition Overall Cognitive Status: Within Functional Limits for tasks assessed (decreased awareness at times) Orientation Level: Oriented X4 Cognition Arousal/Alertness: Awake/alert Behavior During Therapy: WFL for tasks assessed/performed Overall Cognitive Status: Within Functional Limits for tasks assessed (decreased awareness at times)  Blood pressure 171/95, pulse 70, temperature 97.9 F (36.6 C), temperature source Oral, resp. rate 16, height 5\' 10"  (1.778 m), weight 78.926 kg (174 lb), SpO2 100 %. Physical Exam  Nursing note and vitals reviewed. Constitutional: He is oriented to  person, place, and time. He appears well-developed and well-nourished.  HENT:  Head: Normocephalic and atraumatic.  Eyes: Conjunctivae are normal. Pupils are equal, round, and reactive to light.  Neck: Normal range of motion. Neck supple.  Cardiovascular: Normal rate and regular rhythm.   No murmur heard. Respiratory: Effort normal and breath sounds normal. No respiratory distress. He has no wheezes.  GI: Soft. Bowel sounds are normal. He exhibits no distension. There is no tenderness.  Musculoskeletal:  Severe varus deformities, right more than left knees. Has bilateral knee orthoses  Neurological: He is alert and oriented to person, place, and time. He displays normal reflexes.  Mild dysarthria. Able to follow one and two step commands. RUE: 4/5 delt, bicep, tricep, HI, RLE: 4- HF, 4/5 KE and ADF/APF.  LUE: 5/5 delt, bicep,tricep, HI. LLE: 4+ prox to distal. No sensory deficits.   Skin: Skin is warm and dry. No rash noted. No erythema.  Psychiatric:  Flat, some delays in processing    Results for orders placed or performed during the hospital encounter of 09/21/14 (from the past 24 hour(s))  Vitamin B12     Status: Abnormal   Collection Time: 09/22/14  1:35 PM  Result Value Ref Range   Vitamin B-12 >2000 (H) 211 - 911 pg/mL  Hepatitis C antibody     Status: Abnormal   Collection Time: 09/22/14  1:35 PM  Result Value Ref Range   HCV Ab Reactive (A) NEGATIVE  Hepatitis B surface antigen     Status: None   Collection Time: 09/22/14  1:35 PM  Result Value Ref Range   Hepatitis  B Surface Ag NEGATIVE NEGATIVE  CBC     Status: Abnormal   Collection Time: 09/23/14  4:07 AM  Result Value Ref Range   WBC 3.9 (L) 4.0 - 10.5 K/uL   RBC 4.45 4.22 - 5.81 MIL/uL   Hemoglobin 13.7 13.0 - 17.0 g/dL   HCT 41.6 39.0 - 52.0 %   MCV 93.5 78.0 - 100.0 fL   MCH 30.8 26.0 - 34.0 pg   MCHC 32.9 30.0 - 36.0 g/dL   RDW 13.3 11.5 - 15.5 %   Platelets 152 150 - 400 K/uL  Comprehensive metabolic panel      Status: Abnormal   Collection Time: 09/23/14  4:07 AM  Result Value Ref Range   Sodium 140 135 - 145 mmol/L   Potassium 3.4 (L) 3.5 - 5.1 mmol/L   Chloride 105 96 - 112 mmol/L   CO2 30 19 - 32 mmol/L   Glucose, Bld 85 70 - 99 mg/dL   BUN 9 6 - 23 mg/dL   Creatinine, Ser 1.03 0.50 - 1.35 mg/dL   Calcium 8.7 8.4 - 10.5 mg/dL   Total Protein 6.1 6.0 - 8.3 g/dL   Albumin 3.0 (L) 3.5 - 5.2 g/dL   AST 74 (H) 0 - 37 U/L   ALT 56 (H) 0 - 53 U/L   Alkaline Phosphatase 56 39 - 117 U/L   Total Bilirubin 0.8 0.3 - 1.2 mg/dL   GFR calc non Af Amer 74 (L) >90 mL/min   GFR calc Af Amer 86 (L) >90 mL/min   Anion gap 5 5 - 15   Dg Chest 2 View  09/22/2014   CLINICAL DATA:  Stroke protocol.  EXAM: CHEST  2 VIEW  COMPARISON:  None  FINDINGS: There is normal heart size. No pleural effusion or edema. No airspace consolidation or atelectasis noted. Calcified atherosclerotic plaque involves the thoracic aorta.  IMPRESSION: No active cardiopulmonary abnormalities.   Electronically Signed   By: Kerby Moors M.D.   On: 09/22/2014 09:32   Ct Head Wo Contrast  09/21/2014   CLINICAL DATA:  Elevated blood pressure. Right-sided weakness for 1 week. History of hypertension.  EXAM: CT HEAD WITHOUT CONTRAST  TECHNIQUE: Contiguous axial images were obtained from the base of the skull through the vertex without intravenous contrast.  COMPARISON:  None.  FINDINGS: Ventricles are normal in size and configuration.  There are no parenchymal masses or mass effect.  There is an area of hypoattenuation in the left centrum semiovale, likely due to chronic microvascular ischemic change. It could, potentially, reflect a subacute white matter infarction.  There other patchy areas of white matter hypoattenuation consistent with mild to moderate chronic microvascular ischemic change.  There is no evidence of a transcortical infarct.  There are no extra-axial masses or abnormal fluid collections.  There is no intracranial hemorrhage.   Visualized sinuses and mastoid air cells are clear. No skull lesion.  IMPRESSION: 1. Possible subacute white matter infarct along the left centrum semiovale. This could be further assessed with brain MRI if desired clinically. 2. No other evidence of a recent infarct. No intracranial hemorrhage. 3. Mild moderate chronic microvascular ischemic change.   Electronically Signed   By: Lajean Manes M.D.   On: 09/21/2014 17:25   Mr Brain Wo Contrast  09/22/2014   CLINICAL DATA:  Initial evaluation for acute onset right-sided weakness that began on Monday. Evaluate for stroke.  EXAM: MRI HEAD WITHOUT CONTRAST  MRA HEAD WITHOUT CONTRAST  TECHNIQUE: Multiplanar, multiecho  pulse sequences of the brain and surrounding structures were obtained without intravenous contrast. Angiographic images of the head were obtained using MRA technique without contrast.  COMPARISON:  Prior CT earlier the same day.  FINDINGS: MRI HEAD FINDINGS  Mild diffuse prominence of the CSF containing spaces is compatible with generalized cerebral atrophy. Patchy and confluent T2/FLAIR hyperintensity within the periventricular and deep white matter both cerebral hemispheres most consistent with moderate chronic small vessel ischemic disease.  Small remote left pontine infarct noted. Additional small remote loop donor infarct within the posterior left lentiform nucleus. There is chronic blood products with the left basal ganglia infarct.  There is an area mildly increased signal intensity on DWI sequence located in the superior left basal ganglia extending into the periventricular white matter of the left corona radiata (series 4, image 30). This infarct is likely subacute nature, as the ADC signal abnormality has largely normalized. No associated hemorrhage or significant mass effect. No other infarct.  No mass lesion or midline shift. Ventricles are normal size without hydrocephalus. No extra-axial fluid collection.  Craniocervical junction is normal.  Pituitary gland within normal limits.  No acute abnormality seen about the orbits.  Paranasal sinuses are clear.  No mastoid effusion.  Bone marrow signal intensity within normal limits. Visualized upper cervical spine unremarkable. Scalp soft tissues within normal limits.  MRA HEAD FINDINGS  ANTERIOR CIRCULATION:  The distal cervical segments of the internal carotid arteries are widely patent with antegrade flow. The petrous, cavernous, and supra clinoid segments are well opacified without significant stenosis. A1 segments, anterior communicating artery, and anterior cerebral arteries widely patent. Median callosal branch noted. M1 segments widely patent without significant stenosis or occlusion. MCA bifurcation is normal. Distal MCA branches within normal limits.  POSTERIOR CIRCULATION:  Left vertebral artery is dominant and widely patent to the vertebrobasilar junction. Diminutive right vertebral artery divides at the right posterior inferior cerebellar artery, with a small hypoplastic branch ascending torus the vertebrobasilar junction. The right posterior inferior cerebral artery is patent. The left posterior inferior cerebral artery is not visualized. Basilar artery widely patent. Anterior inferior cerebral arteries are patent bilaterally. The left AICA is dominant. Superior cerebellar arteries widely patent. P1 and P2 segments widely patent.  No aneurysm.  IMPRESSION: MRI HEAD IMPRESSION:  1. Subacute ischemic infarct involving the superior left basal ganglia with cephalad extension into the periventricular white matter of the left corona radiata. No associated hemorrhage or significant mass effect. 2. Tiny remote left pontine and left basal ganglia lacunar infarcts. 3. Atrophy with moderate chronic microvascular ischemic disease.  MRA HEAD IMPRESSION:  Normal intracranial MRA without evidence of hemodynamically significant stenosis or proximal branch occlusion.   Electronically Signed   By: Jeannine Boga M.D.   On: 09/22/2014 07:10   Mr Jodene Nam Head/brain Wo Cm  09/22/2014   CLINICAL DATA:  Initial evaluation for acute onset right-sided weakness that began on Monday. Evaluate for stroke.  EXAM: MRI HEAD WITHOUT CONTRAST  MRA HEAD WITHOUT CONTRAST  TECHNIQUE: Multiplanar, multiecho pulse sequences of the brain and surrounding structures were obtained without intravenous contrast. Angiographic images of the head were obtained using MRA technique without contrast.  COMPARISON:  Prior CT earlier the same day.  FINDINGS: MRI HEAD FINDINGS  Mild diffuse prominence of the CSF containing spaces is compatible with generalized cerebral atrophy. Patchy and confluent T2/FLAIR hyperintensity within the periventricular and deep white matter both cerebral hemispheres most consistent with moderate chronic small vessel ischemic disease.  Small remote left  pontine infarct noted. Additional small remote loop donor infarct within the posterior left lentiform nucleus. There is chronic blood products with the left basal ganglia infarct.  There is an area mildly increased signal intensity on DWI sequence located in the superior left basal ganglia extending into the periventricular white matter of the left corona radiata (series 4, image 30). This infarct is likely subacute nature, as the ADC signal abnormality has largely normalized. No associated hemorrhage or significant mass effect. No other infarct.  No mass lesion or midline shift. Ventricles are normal size without hydrocephalus. No extra-axial fluid collection.  Craniocervical junction is normal. Pituitary gland within normal limits.  No acute abnormality seen about the orbits.  Paranasal sinuses are clear.  No mastoid effusion.  Bone marrow signal intensity within normal limits. Visualized upper cervical spine unremarkable. Scalp soft tissues within normal limits.  MRA HEAD FINDINGS  ANTERIOR CIRCULATION:  The distal cervical segments of the internal carotid arteries are  widely patent with antegrade flow. The petrous, cavernous, and supra clinoid segments are well opacified without significant stenosis. A1 segments, anterior communicating artery, and anterior cerebral arteries widely patent. Median callosal branch noted. M1 segments widely patent without significant stenosis or occlusion. MCA bifurcation is normal. Distal MCA branches within normal limits.  POSTERIOR CIRCULATION:  Left vertebral artery is dominant and widely patent to the vertebrobasilar junction. Diminutive right vertebral artery divides at the right posterior inferior cerebellar artery, with a small hypoplastic branch ascending torus the vertebrobasilar junction. The right posterior inferior cerebral artery is patent. The left posterior inferior cerebral artery is not visualized. Basilar artery widely patent. Anterior inferior cerebral arteries are patent bilaterally. The left AICA is dominant. Superior cerebellar arteries widely patent. P1 and P2 segments widely patent.  No aneurysm.  IMPRESSION: MRI HEAD IMPRESSION:  1. Subacute ischemic infarct involving the superior left basal ganglia with cephalad extension into the periventricular white matter of the left corona radiata. No associated hemorrhage or significant mass effect. 2. Tiny remote left pontine and left basal ganglia lacunar infarcts. 3. Atrophy with moderate chronic microvascular ischemic disease.  MRA HEAD IMPRESSION:  Normal intracranial MRA without evidence of hemodynamically significant stenosis or proximal branch occlusion.   Electronically Signed   By: Jeannine Boga M.D.   On: 09/22/2014 07:10    Assessment/Plan: Diagnosis: left basal ganglia infarct 1. Does the need for close, 24 hr/day medical supervision in concert with the patient's rehab needs make it unreasonable for this patient to be served in a less intensive setting? Yes 2. Co-Morbidities requiring supervision/potential complications: bilateral severe OA of knees,  uncontrolled HTN, thrombocytopenia 3. Due to bladder management, bowel management, safety, skin/wound care, disease management, medication administration, pain management and patient education, does the patient require 24 hr/day rehab nursing? Yes 4. Does the patient require coordinated care of a physician, rehab nurse, PT (1-2 hrs/day, 5 days/week), OT (1-2 hrs/day, 5 days/week) and SLP (1-2 hrs/day, 5 days/week) to address physical and functional deficits in the context of the above medical diagnosis(es)? Yes Addressing deficits in the following areas: balance, endurance, locomotion, strength, transferring, bowel/bladder control, bathing, dressing, feeding, grooming, toileting, cognition, speech and psychosocial support 5. Can the patient actively participate in an intensive therapy program of at least 3 hrs of therapy per day at least 5 days per week? Yes 6. The potential for patient to make measurable gains while on inpatient rehab is excellent 7. Anticipated functional outcomes upon discharge from inpatient rehab are modified independent and supervision  with PT,  modified independent and supervision with OT, modified independent with SLP. 8. Estimated rehab length of stay to reach the above functional goals is: 7-10 days 9. Does the patient have adequate social supports and living environment to accommodate these discharge functional goals? Yes 10. Anticipated D/C setting: Home 11. Anticipated post D/C treatments: [place] therapy 12. Overall Rehab/Functional Prognosis: excellent  RECOMMENDATIONS: This patient's condition is appropriate for continued rehabilitative care in the following setting: CIR Patient has agreed to participate in recommended program. Yes Note that insurance prior authorization may be required for reimbursement for recommended care.  Comment: Pt is motivated. Has good social supports. There is a full flight of steps to enter his second floor, one level apt. Rehab Admissions  Coordinator to follow up.  Thanks,  Meredith Staggers, MD, Mellody Drown     09/23/2014

## 2014-09-23 NOTE — Evaluation (Signed)
Speech Language Pathology Evaluation Patient Details Name: [name] MRN: [id number] DOB: 1950/11/27 Today's Date: 09/23/2014 Time: 4481-8563 SLP Time Calculation (min) (ACUTE ONLY): 24 min  Problem List:  Patient Active Problem List   Diagnosis Date Noted  . Ischemic stroke 09/22/2014  . Dyslipidemia 09/22/2014  . Thrombocytopenia 09/22/2014  . Essential hypertension   . Stroke 09/21/2014  . Uncontrolled hypertension 09/21/2014   Past Medical History:  Past Medical History  Diagnosis Date  . Knee injury     right knee injury   . Hypertension   . Glaucoma   . Cataracts, bilateral    Past Surgical History:  Past Surgical History  Procedure Laterality Date  . No past surgeries     HPI:  Pt is a 64 y/o male with a PMHx of HTN, who presents to the ED with complaints of right-sided weakness that began on Monday. Patient reports that he called the ambulance, who noted that his blood pressure was high and recomended pt see primary care doctor; did not trasnport. Pt went to urgent care on 09/21/14, who directed him to the emergency room for possible stroke. Patient reports that he does not take any medications for his blood pressure because he does not go to a primary care doctor. He reports that the right arm and right leg have been weak since Monday, with no improvement during the course of the week. He reports some tingling in the right upper extremity. Sister at bedside reports some difficulties with his word finding; report pt has a stutter at baseline therefore difficult to determine whether he is truly having word finding difficulties. They deny any facial droop, asymmetry, or syncope. Patient denies any fevers, chills, neck pain or stiffness, headache, vision changes, dizziness, lightheadedness, chest pain, shortness of breath, abdominal pain, nausea, vomiting, diarrhea, constipation, dysuria, hematuria, or numbness. Reports that he has been having difficulty walking due to the  right leg weakness. MRI revealed subacute ischemic infarct involving the superior left basal ganglia with cephalad extension into the periventricular white matter of the left corona radiata. No associated hemorrhage or significant mass effect. Tiny remote left pontine and left basal ganglia lacunar infarcts. Atrophy with moderate chronic microvascular ischemic disease.   Assessment / Plan / Recommendation Clinical Impression  Goal of session was to assess pt's speech, language and cognitive skills. Caregiver reports mild intellectual disability at baseline. Pt demonstrates clear vocal quality, good articulation and volume. Pt demonstrates adequate cognition and receptive language skills. Pt demonstrates mild impairment in expressive language skills characterized by difficulty with word finding. Discussed finding with caregiver. Speech will follow-up for treatment. Pt good candidate for CIR.     SLP Assessment  Patient needs continued Speech Lanaguage Pathology Services    Follow Up Recommendations  Inpatient Rehab    Frequency and Duration min 2x/week  2 weeks   Pertinent Vitals/Pain Pain Assessment: No/denies pain   SLP Goals  Potential to Achieve Goals (ACUTE ONLY): Good  SLP Evaluation Prior Functioning  Cognitive/Linguistic Baseline: Baseline deficits Baseline deficit details: Per caregiver report, pt has a baseline intellectual disability.  Type of Home: Apartment  Lives With: Family Available Help at Discharge: Family;Available 24 hours/day Vocation: Retired   Associate Professor  Overall Cognitive Status: Within Functional Limits for tasks assessed    Comprehension  Auditory Comprehension Overall Auditory Comprehension: Appears within functional limits for tasks assessed Yes/No Questions: Within Functional Limits Commands: Within Functional Limits Conversation: Simple EffectiveTechniques: Extra processing time;Increased volume;Pausing;Repetition;Slowed speech Visual  Recognition/Discrimination Discrimination: Within Function Limits  Reading Comprehension Reading Status: Within funtional limits    Expression Expression Primary Mode of Expression: Verbal Verbal Expression Overall Verbal Expression: Impaired Repetition: No impairment Naming: Impairment Responsive: 76-100% accurate Confrontation: Within functional limits Convergent: Not tested Divergent: 50-74% accurate Pragmatics: No impairment Written Expression Dominant Hand: Right Written Expression: Within Functional Limits (Poor handwritting ability present at baseline.)   Oral / Motor Oral Motor/Sensory Function Overall Oral Motor/Sensory Function: Appears within functional limits for tasks assessed Labial Symmetry: Within Functional Limits Lingual ROM: Within Functional Limits Facial ROM: Within Functional Limits Motor Speech Overall Motor Speech: Appears within functional limits for tasks assessed   GO     Bermudez-Bosch, Edwing Figley 09/23/2014, 12:05 PM

## 2014-09-23 NOTE — Progress Notes (Signed)
STROKE TEAM PROGRESS NOTE   HISTORY Yonathan Perrow is an 64 y.o. male with a history of knee injury and arthritis, as well as hypertension, untreated, presenting with new onset weakness involving his right side. He was last known well about 10-12 days ago. He did not seek medical attention until yesterday. 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 Tiny remote left pontine and left basal ganglia infarctions were also noted. Patient has had difficulty with walking as well as use of his right upper extremity. Speech is been slightly slurred. He said no swallowing difficulty. He has not been on antiplatelet therapy. He was admitted for evaluation and management of subacute stroke. NIH stroke score was 4.  LSN: 10-12 days ago tPA Given: No: Beyond time window for treatment consideration He was admitted to the neuro 4 N for further evaluation and treatment.  No issues overnight as per nursing staff.  SUBJECTIVE (INTERVAL HISTORY) His sister is at the bedside.  Overall he feels his condition is unchanged. He has persistent mild right-sided weakness  OBJECTIVE Temp:  [97.9 F (36.6 C)-98.6 F (37 C)] 97.9 F (36.6 C) (02/22 0955) Pulse Rate:  [65-81] 70 (02/22 0955) Cardiac Rhythm:  [-] Normal sinus rhythm (02/21 2045) Resp:  [16-20] 16 (02/22 0955) BP: (158-202)/(91-130) 171/95 mmHg (02/22 0955) SpO2:  [98 %-100 %] 100 % (02/22 0955)  No results for input(s): GLUCAP in the last 168 hours.  Recent Labs Lab 09/21/14 1553 09/22/14 0030 09/22/14 0710 09/23/14 0407  NA 135  --  140 140  K 3.6  --  3.6 3.4*  CL 102  --  104 105  CO2 27  --  31 30  GLUCOSE 92  --  79 85  BUN 13  --  14 9  CREATININE 0.98 1.19 1.09 1.03  CALCIUM 8.7  --  8.8 8.7    Recent Labs Lab 09/21/14 1553 09/22/14 0710 09/23/14 0407  AST 56* 58* 74*  ALT 45 43 56*  ALKPHOS 62 57 56  BILITOT 0.9 0.9 0.8  PROT 7.2 6.0 6.1  ALBUMIN 3.7  3.0* 3.0*    Recent Labs Lab 09/21/14 1553 09/22/14 0030 09/22/14 0710 09/23/14 0407  WBC 5.2 5.4 4.2 3.9*  NEUTROABS 3.4  --  2.1  --   HGB 15.3 13.7 13.6 13.7  HCT 45.4 41.4 40.8 41.6  MCV 91.7 93.9 94.2 93.5  PLT 175 152 143* 152    Recent Labs Lab 09/21/14 1553  TROPONINI <0.03    Recent Labs  09/21/14 1553  LABPROT 14.1  INR 1.09    Recent Labs  09/21/14 1641  COLORURINE YELLOW  LABSPEC 1.019  PHURINE 5.5  GLUCOSEU NEGATIVE  HGBUR NEGATIVE  BILIRUBINUR NEGATIVE  KETONESUR 15*  PROTEINUR NEGATIVE  UROBILINOGEN 0.2  NITRITE NEGATIVE  LEUKOCYTESUR TRACE*       Component Value Date/Time   CHOL 127 09/22/2014 0710   TRIG 55 09/22/2014 0710   HDL 36* 09/22/2014 0710   CHOLHDL 3.5 09/22/2014 0710   VLDL 11 09/22/2014 0710   LDLCALC 80 09/22/2014 0710   Lab Results  Component Value Date   HGBA1C 5.5 09/22/2014      Component Value Date/Time   LABOPIA NONE DETECTED 09/21/2014 1641   COCAINSCRNUR NONE DETECTED 09/21/2014 1641   LABBENZ NONE DETECTED 09/21/2014 1641   AMPHETMU NONE DETECTED 09/21/2014 1641   THCU POSITIVE* 09/21/2014 1641   LABBARB NONE DETECTED 09/21/2014 1641  Recent Labs Lab 09/21/14 [place] <5    Dg Chest 2 View  09/22/2014   CLINICAL DATA:  Stroke protocol.  EXAM: CHEST  2 VIEW  COMPARISON:  None  FINDINGS: There is normal heart size. No pleural effusion or edema. No airspace consolidation or atelectasis noted. Calcified atherosclerotic plaque involves the thoracic aorta.  IMPRESSION: No active cardiopulmonary abnormalities.   Electronically Signed   By: Kerby Moors M.D.   On: 09/22/2014 09:32   Ct Head Wo Contrast  09/21/2014   CLINICAL DATA:  Elevated blood pressure. Right-sided weakness for 1 week. History of hypertension.  EXAM: CT HEAD WITHOUT CONTRAST  TECHNIQUE: Contiguous axial images were obtained from the base of the skull through the vertex without intravenous contrast.  COMPARISON:  None.  FINDINGS:  Ventricles are normal in size and configuration.  There are no parenchymal masses or mass effect.  There is an area of hypoattenuation in the left centrum semiovale, likely due to chronic microvascular ischemic change. It could, potentially, reflect a subacute white matter infarction.  There other patchy areas of white matter hypoattenuation consistent with mild to moderate chronic microvascular ischemic change.  There is no evidence of a transcortical infarct.  There are no extra-axial masses or abnormal fluid collections.  There is no intracranial hemorrhage.  Visualized sinuses and mastoid air cells are clear. No skull lesion.  IMPRESSION: 1. Possible subacute white matter infarct along the left centrum semiovale. This could be further assessed with brain MRI if desired clinically. 2. No other evidence of a recent infarct. No intracranial hemorrhage. 3. Mild moderate chronic microvascular ischemic change.   Electronically Signed   By: Lajean Manes M.D.   On: 09/21/2014 17:25   Mr Brain Wo Contrast  09/22/2014   CLINICAL DATA:  Initial evaluation for acute onset right-sided weakness that began on Monday. Evaluate for stroke.  EXAM: MRI HEAD WITHOUT CONTRAST  MRA HEAD WITHOUT CONTRAST  TECHNIQUE: Multiplanar, multiecho pulse sequences of the brain and surrounding structures were obtained without intravenous contrast. Angiographic images of the head were obtained using MRA technique without contrast.  COMPARISON:  Prior CT earlier the same day.  FINDINGS: MRI HEAD FINDINGS  Mild diffuse prominence of the CSF containing spaces is compatible with generalized cerebral atrophy. Patchy and confluent T2/FLAIR hyperintensity within the periventricular and deep white matter both cerebral hemispheres most consistent with moderate chronic small vessel ischemic disease.  Small remote left pontine infarct noted. Additional small remote loop donor infarct within the posterior left lentiform nucleus. There is chronic blood  products with the left basal ganglia infarct.  There is an area mildly increased signal intensity on DWI sequence located in the superior left basal ganglia extending into the periventricular white matter of the left corona radiata (series 4, image 30). This infarct is likely subacute nature, as the ADC signal abnormality has largely normalized. No associated hemorrhage or significant mass effect. No other infarct.  No mass lesion or midline shift. Ventricles are normal size without hydrocephalus. No extra-axial fluid collection.  Craniocervical junction is normal. Pituitary gland within normal limits.  No acute abnormality seen about the orbits.  Paranasal sinuses are clear.  No mastoid effusion.  Bone marrow signal intensity within normal limits. Visualized upper cervical spine unremarkable. Scalp soft tissues within normal limits.  MRA HEAD FINDINGS  ANTERIOR CIRCULATION:  The distal cervical segments of the internal carotid arteries are widely patent with antegrade flow. The petrous, cavernous, and supra clinoid segments are well opacified without  significant stenosis. A1 segments, anterior communicating artery, and anterior cerebral arteries widely patent. Median callosal branch noted. M1 segments widely patent without significant stenosis or occlusion. MCA bifurcation is normal. Distal MCA branches within normal limits.  POSTERIOR CIRCULATION:  Left vertebral artery is dominant and widely patent to the vertebrobasilar junction. Diminutive right vertebral artery divides at the right posterior inferior cerebellar artery, with a small hypoplastic branch ascending torus the vertebrobasilar junction. The right posterior inferior cerebral artery is patent. The left posterior inferior cerebral artery is not visualized. Basilar artery widely patent. Anterior inferior cerebral arteries are patent bilaterally. The left AICA is dominant. Superior cerebellar arteries widely patent. P1 and P2 segments widely patent.  No  aneurysm.  IMPRESSION: MRI HEAD IMPRESSION:  1. Subacute ischemic infarct involving the superior left basal ganglia with cephalad extension into the periventricular white matter of the left corona radiata. No associated hemorrhage or significant mass effect. 2. Tiny remote left pontine and left basal ganglia lacunar infarcts. 3. Atrophy with moderate chronic microvascular ischemic disease.  MRA HEAD IMPRESSION:  Normal intracranial MRA without evidence of hemodynamically significant stenosis or proximal branch occlusion.   Electronically Signed   By: Jeannine Boga M.D.   On: 09/22/2014 07:10   Mr Jodene Nam Head/brain Wo Cm  09/22/2014   CLINICAL DATA:  Initial evaluation for acute onset right-sided weakness that began on Monday. Evaluate for stroke.  EXAM: MRI HEAD WITHOUT CONTRAST  MRA HEAD WITHOUT CONTRAST  TECHNIQUE: Multiplanar, multiecho pulse sequences of the brain and surrounding structures were obtained without intravenous contrast. Angiographic images of the head were obtained using MRA technique without contrast.  COMPARISON:  Prior CT earlier the same day.  FINDINGS: MRI HEAD FINDINGS  Mild diffuse prominence of the CSF containing spaces is compatible with generalized cerebral atrophy. Patchy and confluent T2/FLAIR hyperintensity within the periventricular and deep white matter both cerebral hemispheres most consistent with moderate chronic small vessel ischemic disease.  Small remote left pontine infarct noted. Additional small remote loop donor infarct within the posterior left lentiform nucleus. There is chronic blood products with the left basal ganglia infarct.  There is an area mildly increased signal intensity on DWI sequence located in the superior left basal ganglia extending into the periventricular white matter of the left corona radiata (series 4, image 30). This infarct is likely subacute nature, as the ADC signal abnormality has largely normalized. No associated hemorrhage or significant  mass effect. No other infarct.  No mass lesion or midline shift. Ventricles are normal size without hydrocephalus. No extra-axial fluid collection.  Craniocervical junction is normal. Pituitary gland within normal limits.  No acute abnormality seen about the orbits.  Paranasal sinuses are clear.  No mastoid effusion.  Bone marrow signal intensity within normal limits. Visualized upper cervical spine unremarkable. Scalp soft tissues within normal limits.  MRA HEAD FINDINGS  ANTERIOR CIRCULATION:  The distal cervical segments of the internal carotid arteries are widely patent with antegrade flow. The petrous, cavernous, and supra clinoid segments are well opacified without significant stenosis. A1 segments, anterior communicating artery, and anterior cerebral arteries widely patent. Median callosal branch noted. M1 segments widely patent without significant stenosis or occlusion. MCA bifurcation is normal. Distal MCA branches within normal limits.  POSTERIOR CIRCULATION:  Left vertebral artery is dominant and widely patent to the vertebrobasilar junction. Diminutive right vertebral artery divides at the right posterior inferior cerebellar artery, with a small hypoplastic branch ascending torus the vertebrobasilar junction. The right posterior inferior cerebral artery is patent.  The left posterior inferior cerebral artery is not visualized. Basilar artery widely patent. Anterior inferior cerebral arteries are patent bilaterally. The left AICA is dominant. Superior cerebellar arteries widely patent. P1 and P2 segments widely patent.  No aneurysm.  IMPRESSION: MRI HEAD IMPRESSION:  1. Subacute ischemic infarct involving the superior left basal ganglia with cephalad extension into the periventricular white matter of the left corona radiata. No associated hemorrhage or significant mass effect. 2. Tiny remote left pontine and left basal ganglia lacunar infarcts. 3. Atrophy with moderate chronic microvascular ischemic  disease.  MRA HEAD IMPRESSION:  Normal intracranial MRA without evidence of hemodynamically significant stenosis or proximal branch occlusion.   Electronically Signed   By: Jeannine Boga M.D.   On: 09/22/2014 07:10    PHYSICAL EXAM HEENT- Normocephalic, no lesions, without obvious abnormality. Normal external eye and conjunctiva. Normal TM's bilaterally. Normal auditory canals and external ears. Normal external nose, mucus membranes and septum. Normal pharynx. Neck- supple with no masses, nodes, nodules or enlargement. Cardiovascular - regular rate and rhythm, S1, S2 normal, no murmur, click, rub or gallop Lungs - chest clear, no wheezing, rales, normal symmetric air entry Abdomen - soft, non-tender; bowel sounds normal; no masses, no organomegaly Extremities - no edema and no skin discoloration  Neurologic Examination: Mental Status: Alert, oriented, thought content appropriate. Speech fluent without evidence of aphasia. Able to follow commands without difficulty. Cranial Nerves: II-Visual fields were normal. III/IV/VI-Pupils were equal and reacted normally to light. Extraocular movements were full and conjugate.  V/VII-no facial numbness; mild flattening of right nasolabial fold. VIII-normal. X-normal speech and symmetrical palatal movement. XI: trapezius strength/neck flexion strength normal bilaterally XII-midline tongue extension with normal strength. Motor: Mild drift of right upper and lower extremities with 4/5 hemiparesis; normal strength of left extremities; normal muscle tone throughout. Sensory: Normal throughout. Deep Tendon Reflexes: 2+ and symmetric. Plantars: Flexor bilaterally Cerebellar: Slightly impaired coordination involving right upper extremity. Carotid auscultation: Normal  ASSESSMENT/PLAN Mr. Jakai Risse is a 64 y.o. male with history of hypertension presenting with right sided weakness. He did not receive IV t-PA due to being outside of  window.   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  MRI  Subacute ischemic infarct involving the superior left basal ganglia with cephalad extension into the periventricular white matter of the left corona radiata. No associated hemorrhage or significant mass effect. 2. Tiny remote left pontine and left basal ganglia lacunar infarcts. 3. Atrophy with moderate chronic microvascular ischemic disease.   MRA   Normal intracranial MRA without evidence of hemodynamically significant stenosis or proximal branch occlusion.   Carotid Doppler  1-39% ICA stenosis. Vertebral artery flow is antegrade.  2D Echo pending  LDL 80  HgbA1c 5.5  Lovenox for VTE prophylaxis  Diet NPO time specified   no antithrombotic prior to admission, now on aspirin 325 mg orally every day  Patient counseled to be compliant with his antithrombotic medications  Ongoing aggressive stroke risk factor management  Therapy recommendations:  Acute rehab  Disposition: Acute Rehab  Hypertension  Home meds:   Nil of note  Unstable  Patient counseled to be compliant with his blood pressure medications  Hyperlipidemia  Home meds: nil of note  LDL 80, goal < 70  Add Lipitor 10 mgs   Continue statin at discharge  Diabetes  HgbA1c  5.5, goal < 7.0  Controlled  Other Stroke Risk Factors  Advanced age  Obesity, Body mass index is 24.97 kg/(m^2).   Family hx stroke (mother)  Patient chart and images have been reviewed Patient seen and discussed with Dr. Candyce Churn day # 2   Lanelle Bal, PA-C Dept. of Neurology   I have personally examined this patient, reviewed notes, independently viewed imaging studies, participated in medical decision making and plan of care. I have made any additions or  clarifications directly to the above note. Agree with note above.  He has left subcortical infarct secondary to small vessel disease) remains at risk for recurrent strokes and TIAs. He needs to continue ongoing stroke evaluation and maintain aggressive risk factor modification. Discussed with patient and sister and answered questions  Antony Contras, MD Medical Director [place] Pager: [phone] 09/23/2014 2:02 PM    To contact Stroke Continuity provider, please refer to [email or web address] After hours, contact General Neurology

## 2014-09-23 NOTE — Progress Notes (Signed)
Rehab Admissions Coordinator Note:  Patient was screened by Cleatrice Burke for appropriateness for an Inpatient Acute Rehab Consult per PT recommendation.   At this time, we are recommending Inpatient Rehab consult. I will contact Dr. Carles Collet for order.  Cleatrice Burke 09/23/2014, 8:26 AM  I can be reached at [phone].

## 2014-09-23 NOTE — Progress Notes (Signed)
  Echocardiogram 2D Echocardiogram has been performed.  [name] 09/23/2014, 3:00 PM

## 2014-09-23 NOTE — Progress Notes (Signed)
Transport here to take patient down for ultra sound. The tech in the room performing echo. Ultra sound to reschedule time for the procedure

## 2014-09-23 NOTE — Evaluation (Signed)
Occupational Therapy Evaluation Patient Details Name: [name] MRN: [id number] DOB: Jun 17, 1951 Today's Date: 09/23/2014    History of Present Illness Patient is a 64 yo male admitted 09/21/14 with one week history of gait instability and weakness on Rt side, UE>LE.  Patient with NIHSS of 4.  MRI - subacute infarct Lt basal ganglia.  PMH:  HTN uncontrolled, substance abuse, bil knee pain/injury - surgery Lt knee   Clinical Impression   Patient mod I PTA. Patient currently functioning at an overall min assist level. Patient will benefit from acute OT to increase overall independence in the areas of ADLs, functional mobility, overall safety in order to safely discharge to venue listed below.     Follow Up Recommendations  CIR;Supervision/Assistance - 24 hour    Equipment Recommendations  Tub/shower bench;3 in 1 bedside comode    Recommendations for Other Services Rehab consult     Precautions / Restrictions Precautions Precautions: Fall Required Braces or Orthoses: Other Brace/Splint Other Brace/Splint: Patient wears bil knee braces, "I've been wearing them for a long time, had problems with my knees" Restrictions Weight Bearing Restrictions: No      Mobility Bed Mobility General bed mobility comments: Patient seatede EOB upon OT entering room  Transfers Overall transfer level: Needs assistance Equipment used: Quad cane Transfers: Sit to/from Stand Sit to Stand: Min assist    Balance Overall balance assessment: Needs assistance Sitting-balance support: No upper extremity supported;Feet supported Sitting balance-Leahy Scale: Good     Standing balance support: Single extremity supported;During functional activity Standing balance-Leahy Scale: Poor    ADL Overall ADL's : Needs assistance/impaired Eating/Feeding: Set up   Grooming: Min guard;Standing   Upper Body Bathing: Minimal assitance;Sitting   Lower Body Bathing: Minimal assistance;Sit to/from  stand;Cueing for safety   Upper Body Dressing : Minimal assistance;Sitting   Lower Body Dressing: Minimal assistance;Sit to/from stand;Cueing for safety   Toilet Transfer: Minimal assistance;Grab bars;Ambulation Toilet Transfer Details (indicate cue type and reason): using quad cane Toileting- Clothing Manipulation and Hygiene: Sit to/from stand;Min guard       Functional mobility during ADLs: Minimal assistance;Cueing for safety (using quad cane) General ADL Comments: Patient able to bring BLEs to self for LB ADLs. Patient with increased vargus during standing and functional ambulation. During functional ambulation with quad cane, patient tended to furniture walk and was very unsteady on feet. RUE uncoordinated during functional activity. According to patient he was getting around a lot better prior to this CVA.      Vision Vision Assessment?: Vision impaired- to be further tested in functional context;Yes Eye Alignment: Within Functional Limits Ocular Range of Motion: Within Functional Limits Alignment/Gaze Preference: Within Defined Limits Tracking/Visual Pursuits: Decreased smoothness of vertical tracking   Perception Perception Perception Tested?: No   Praxis Praxis Praxis tested?: Within functional limits    Pertinent Vitals/Pain Pain Assessment: No/denies pain     Hand Dominance Right   Extremity/Trunk Assessment Upper Extremity Assessment Upper Extremity Assessment: RUE deficits/detail RUE Deficits / Details: decreased coordination and overall strength throughout RUE   Lower Extremity Assessment Lower Extremity Assessment: Defer to PT evaluation   Cervical / Trunk Assessment Cervical / Trunk Assessment: Normal   Communication Communication Communication: No difficulties   Cognition Arousal/Alertness: Awake/alert Behavior During Therapy: WFL for tasks assessed/performed Overall Cognitive Status: Within Functional Limits for tasks assessed (decreased awareness  at times)             Home Living Family/patient expects to be discharged to:: Private residence Living Arrangements:  Other relatives (sister) Available Help at Discharge: Family;Available 24 hours/day Type of Home: Apartment Home Access: Stairs to enter [employer] of Steps: flight Entrance Stairs-Rails: Left (going up on the left) Home Layout: One level     Bathroom Shower/Tub: Tub/shower unit;Curtain   Biochemist, clinical: Standard     Home Equipment: Cane - quad          Prior Functioning/Environment Level of Independence: Independent with assistive device(s)        Comments: Uses quad cane.  Reports he had been going to gym to use weights, and walking.    OT Diagnosis: Generalized weakness;Hemiplegia dominant side   OT Problem List: Decreased strength;Decreased activity tolerance;Impaired balance (sitting and/or standing);Decreased coordination;Impaired vision/perception;Decreased safety awareness;Decreased knowledge of use of DME or AE   OT Treatment/Interventions: Self-care/ADL training;Therapeutic exercise;Energy conservation;DME and/or AE instruction;Therapeutic activities;Patient/family education;Balance training    OT Goals(Current goals can be found in the care plan section) Acute Rehab OT Goals Patient Stated Goal: go to rehab OT Goal Formulation: With patient Time For Goal Achievement: 10/07/14 Potential to Achieve Goals: Good ADL Goals Pt Will Perform Grooming: with supervision;standing Pt Will Perform Upper Body Bathing: with set-up;sitting Pt Will Perform Lower Body Bathing: with supervision;sit to/from stand Pt Will Perform Upper Body Dressing: with set-up;sitting Pt Will Perform Lower Body Dressing: with supervision;sit to/from stand Pt Will Transfer to Toilet: with supervision;ambulating Pt/caregiver will Perform Home Exercise Program: Right Upper extremity;Increased strength;With written HEP provided;With Supervision  OT Frequency: Min  2X/week   Barriers to D/C: None known at this time          End of Session Equipment Utilized During Treatment: Gait belt;Other (comment) (quad cane)  Activity Tolerance: Patient tolerated treatment well Patient left: in chair;with call bell/phone within reach;with family/visitor present;with chair alarm set   Time: [phone] OT Time Calculation (min): 24 min Charges:  OT General Charges $OT Visit: 1 Procedure OT Evaluation $Initial OT Evaluation Tier I: 1 Procedure OT Treatments $Self Care/Home Management : 8-22 mins  Latiqua Daloia , MS, OTR/L, CLT Pager: [phone]  09/23/2014, 9:09 AM

## 2014-09-24 LAB — COMPREHENSIVE METABOLIC PANEL
ALK PHOS: 52 U/L (ref 39–117)
ALT: 48 U/L (ref 0–53)
ANION GAP: 6 (ref 5–15)
AST: 58 U/L — ABNORMAL HIGH (ref 0–37)
Albumin: 2.9 g/dL — ABNORMAL LOW (ref 3.5–5.2)
BILIRUBIN TOTAL: 0.7 mg/dL (ref 0.3–1.2)
BUN: 10 mg/dL (ref 6–23)
CHLORIDE: 106 mmol/L (ref 96–112)
CO2: 29 mmol/L (ref 19–32)
Calcium: 8.8 mg/dL (ref 8.4–10.5)
Creatinine, Ser: 1.12 mg/dL (ref 0.50–1.35)
GFR, EST AFRICAN AMERICAN: 78 mL/min — AB (ref >90)
GFR, EST NON AFRICAN AMERICAN: 67 mL/min — AB (ref >90)
GLUCOSE: 95 mg/dL (ref 70–99)
POTASSIUM: 3.6 mmol/L (ref 3.5–5.1)
SODIUM: 141 mmol/L (ref 135–145)
Total Protein: 6 g/dL (ref 6.0–8.3)

## 2014-09-24 MED ORDER — METOPROLOL TARTRATE 50 MG PO TABS
50.0000 mg | ORAL_TABLET | Freq: Two times a day (BID) | ORAL | Status: DC
  Administered 2014-09-24 – 2014-09-25 (×2): 50 mg via ORAL
  Filled 2014-09-24 (×2): qty 1

## 2014-09-24 MED ORDER — METOPROLOL TARTRATE 50 MG PO TABS: 50.0000 mg | ORAL_TABLET | Freq: Two times a day (BID) | ORAL | Status: DC

## 2014-09-24 MED ORDER — ATORVASTATIN CALCIUM 10 MG PO TABS: 10.0000 mg | ORAL_TABLET | Freq: Every day | ORAL | Status: DC

## 2014-09-24 MED ORDER — AMLODIPINE BESYLATE 10 MG PO TABS: 10.0000 mg | ORAL_TABLET | Freq: Every day | ORAL | Status: DC

## 2014-09-24 MED ORDER — INFLUENZA VAC SPLIT QUAD 0.5 ML IM SUSY
0.5000 mL | PREFILLED_SYRINGE | INTRAMUSCULAR | Status: AC
  Administered 2014-09-25: 0.5 mL via INTRAMUSCULAR
  Filled 2014-09-24: qty 0.5

## 2014-09-24 MED ORDER — ASPIRIN 325 MG PO TABS: 325.0000 mg | ORAL_TABLET | Freq: Every day | ORAL | Status: DC

## 2014-09-24 MED ORDER — METOPROLOL TARTRATE 25 MG PO TABS: 25.0000 mg | ORAL_TABLET | Freq: Two times a day (BID) | ORAL | Status: DC

## 2014-09-24 MED ORDER — PNEUMOCOCCAL VAC POLYVALENT 25 MCG/0.5ML IJ INJ
0.5000 mL | INJECTION | INTRAMUSCULAR | Status: AC
  Administered 2014-09-25: 0.5 mL via INTRAMUSCULAR
  Filled 2014-09-24: qty 0.5

## 2014-09-24 NOTE — Progress Notes (Signed)
I met with [name] at bedside and then left a message for his sister to call me to discuss [name]'s rehabilitation. I have begun insurance authorization with Bernadene Person for an inpt rehab admission. I await their decision to admit [name]. [name] is in agreement. [phone]

## 2014-09-24 NOTE — Progress Notes (Signed)
Physical Therapy Treatment Patient Details Name: [name] MRN: [id number] DOB: 1951/05/16 Today's Date: 09/24/2014    History of Present Illness Patient is a 64 yo male admitted 09/21/14 with one week history of gait instability and weakness on Rt side, UE>LE.  Patient with NIHSS of 4.  MRI - subacute infarct Lt basal ganglia.  PMH:  HTN uncontrolled, substance abuse, bil knee pain/injury - surgery Lt knee    PT Comments    Patient progressing slowly with mobility. Continues to require Min-Mod A for gait and assist with advancing RLE. Fatigues quickly. Balance improved with use of RW however pt not using one PTA. Continues to be motivated to go to CIR to improve functional independence.   Follow Up Recommendations  CIR;Supervision/Assistance - 24 hour     Equipment Recommendations  Rolling walker with 5" wheels;3in1 (PT)    Recommendations for Other Services       Precautions / Restrictions Precautions Precautions: Fall Required Braces or Orthoses: Other Brace/Splint Other Brace/Splint: Patient wears bil knee braces, "I've been wearing them for a long time, had problems with my knees" Restrictions Weight Bearing Restrictions: No    Mobility  Bed Mobility Overal bed mobility: Needs Assistance Bed Mobility: Supine to Sit;Sit to Supine     Supine to sit: Min guard;HOB elevated Sit to supine: Min guard;HOB elevated   General bed mobility comments: Use of rails for support.  Transfers Overall transfer level: Needs assistance Equipment used: None Transfers: Sit to/from Stand Sit to Stand: Min assist         General transfer comment: Use of momentum to stand. Cues for anterior translation and hand placement to assist with standing technique. LOB x3 to the left upon standing.  Ambulation/Gait Ambulation/Gait assistance: Min assist Ambulation Distance (Feet): 75 Feet (+ 50') Assistive device: Rolling walker (2 wheeled) (rail) Gait Pattern/deviations: Step-through  pattern;Decreased stride length;Staggering left;Staggering right;Decreased dorsiflexion - right Gait velocity: Decreased Gait velocity interpretation: Below normal speed for age/gender General Gait Details: Ambulated with RW - manual assist for advancement of RLE to facilitate hip flexion. Ambulated with rail requiring constant Min A for balance and assist with advancement of RLE.   Stairs            Wheelchair Mobility    Modified Rankin (Stroke Patients Only) Modified Rankin (Stroke Patients Only) Pre-Morbid Rankin Score: No significant disability Modified Rankin: Moderately severe disability     Balance Overall balance assessment: Needs assistance Sitting-balance support: Feet supported;No upper extremity supported Sitting balance-Leahy Scale: Good     Standing balance support: During functional activity Standing balance-Leahy Scale: Fair                      Cognition Arousal/Alertness: Awake/alert Behavior During Therapy: WFL for tasks assessed/performed Overall Cognitive Status: Within Functional Limits for tasks assessed                      Exercises Other Exercises Other Exercises: sit to stand x6 from EOB with cues for slow ascent/descent emphasizing eccentric quad control    General Comments        Pertinent Vitals/Pain Pain Assessment: No/denies pain    Home Living   Living Arrangements: Other (Comment) (lives with his sister since his Mom died 42 from [place])       Entrance Stairs-Rails: Left     Additional Comments: Pt has appt with Dr Vincente Liberty  on 3/7 to set up PCP    Prior Function  PT Goals (current goals can now be found in the care plan section) Progress towards PT goals: Progressing toward goals    Frequency  Min 4X/week    PT Plan Current plan remains appropriate    Co-evaluation             End of Session Equipment Utilized During Treatment: Gait belt Activity Tolerance:  Patient tolerated treatment well Patient left: in bed;with call bell/phone within reach;with bed alarm set     Time: 1135-1200 PT Time Calculation (min) (ACUTE ONLY): 25 min  Charges:  $Gait Training: 8-22 mins $Therapeutic Activity: 8-22 mins                    G CodesCandy Sledge A Oct 08, 2014, 1:09 PM  Candy Sledge, [place], DPT [phone]

## 2014-09-24 NOTE — Progress Notes (Signed)
STROKE TEAM PROGRESS NOTE   HISTORY [name] is an 64 y.o. male with a history of knee injury and arthritis, as well as hypertension, untreated, presenting with new onset weakness involving his right side. He was last known well about 10-12 days ago. He did not seek medical attention until yesterday. 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 Tiny remote left pontine and left basal ganglia infarctions were also noted. Patient has had difficulty with walking as well as use of his right upper extremity. Speech is been slightly slurred. He said no swallowing difficulty. He has not been on antiplatelet therapy. He was admitted for evaluation and management of subacute stroke. NIH stroke score was 4.  LSN: 10-12 days ago tPA Given: No: Beyond time window for treatment consideration He was admitted to the neuro 4 N for further evaluation and treatment.  No issues overnight as per nursing staff.  SUBJECTIVE (INTERVAL HISTORY) His sister is at the bedside.  Overall he feels his condition is unchanged. He has persistent mild right-sided weakness  OBJECTIVE Temp:  [97.7 F (36.5 C)-99 F (37.2 C)] 97.7 F (36.5 C) (02/23 0531) Pulse Rate:  [60-81] 62 (02/23 0531) Cardiac Rhythm:  [-] Normal sinus rhythm (02/22 2000) Resp:  [16-20] 20 (02/23 0531) BP: (169-192)/(84-98) 172/96 mmHg (02/23 0531) SpO2:  [100 %] 100 % (02/23 0531)  No results for input(s): GLUCAP in the last 168 hours.  Recent Labs Lab 09/21/14 1553 09/22/14 0030 09/22/14 0710 09/23/14 0407 09/24/14 0449  NA 135  --  140 140 141  K 3.6  --  3.6 3.4* 3.6  CL 102  --  104 105 106  CO2 27  --  31 30 29   GLUCOSE 92  --  79 85 95  BUN 13  --  14 9 10   CREATININE 0.98 1.19 1.09 1.03 1.12  CALCIUM 8.7  --  8.8 8.7 8.8    Recent Labs Lab 09/21/14 1553 09/22/14 0710 09/23/14 0407 09/24/14 0449  AST 56* 58* 74* 58*  ALT 45 43 56* 48  ALKPHOS 62  57 56 52  BILITOT 0.9 0.9 0.8 0.7  PROT 7.2 6.0 6.1 6.0  ALBUMIN 3.7 3.0* 3.0* 2.9*    Recent Labs Lab 09/21/14 1553 09/22/14 0030 09/22/14 0710 09/22/14 1335 09/23/14 0407  WBC 5.2 5.4 4.2  --  3.9*  NEUTROABS 3.4  --  2.1  --   --   HGB 15.3 13.7 13.6  --  13.7  HCT 45.4 41.4 40.8 42.1 41.6  MCV 91.7 93.9 94.2  --  93.5  PLT 175 152 143*  --  152    Recent Labs Lab 09/21/14 1553  TROPONINI <0.03    Recent Labs  09/21/14 1553  LABPROT 14.1  INR 1.09    Recent Labs  09/21/14 1641  COLORURINE YELLOW  LABSPEC 1.019  PHURINE 5.5  GLUCOSEU NEGATIVE  HGBUR NEGATIVE  BILIRUBINUR NEGATIVE  KETONESUR 15*  PROTEINUR NEGATIVE  UROBILINOGEN 0.2  NITRITE NEGATIVE  LEUKOCYTESUR TRACE*       Component Value Date/Time   CHOL 127 09/22/2014 0710   TRIG 55 09/22/2014 0710   HDL 36* 09/22/2014 0710   CHOLHDL 3.5 09/22/2014 0710   VLDL 11 09/22/2014 0710   LDLCALC 80 09/22/2014 0710   Lab Results  Component Value Date   HGBA1C 5.5 09/22/2014      Component Value Date/Time   LABOPIA NONE DETECTED 09/21/2014 1641  COCAINSCRNUR NONE DETECTED 09/21/2014 1641   LABBENZ NONE DETECTED 09/21/2014 1641   AMPHETMU NONE DETECTED 09/21/2014 1641   THCU POSITIVE* 09/21/2014 1641   LABBARB NONE DETECTED 09/21/2014 1641     Recent Labs Lab 09/21/14 1615  ETH <5    US Abdomen Limited Ruq  09/23/2014   CLINICAL DATA:  Elevated liver transaminases.  EXAM: US ABDOMEN LIMITED - RIGHT UPPER QUADRANT  COMPARISON:  None.  FINDINGS: Gallbladder:  No gallstones or wall thickening visualized. No sonographic Murphy sign noted.  Common bile duct:  Diameter: 2 mm  Liver:  No focal lesion identified. Within normal limits in parenchymal echogenicity.  [place] right renal cyst is incidentally noted.  IMPRESSION: Normal gallbladder.  Normal liver.  No bile duct dilation.  [place] right renal cyst.   Electronically Signed   By: Lajean Manes M.D.   On: 09/23/2014 17:07    PHYSICAL  EXAM HEENT- Normocephalic, no lesions, without obvious abnormality. Normal external eye and conjunctiva. Normal TM's bilaterally. Normal auditory canals and external ears. Normal external nose, mucus membranes and septum. Normal pharynx. Neck- supple with no masses, nodes, nodules or enlargement. Cardiovascular - regular rate and rhythm, S1, S2 normal, no murmur, click, rub or gallop Lungs - chest clear, no wheezing, rales, normal symmetric air entry Extremities - no edema and no skin discoloration  Neurologic Examination: Mental Status: Alert, oriented, thought content appropriate. Speech fluent without evidence of aphasia. Able to follow commands without difficulty. Cranial Nerves: II-Visual fields were normal. III/IV/VI-Pupils were equal and reacted normally to light. Extraocular movements were full and conjugate.  V/VII-no facial numbness; mild flattening of right nasolabial fold. VIII-normal. X-normal speech and symmetrical palatal movement. XI: trapezius strength/neck flexion strength normal bilaterally XII-midline tongue extension with normal strength. Motor: Mild drift of right upper and lower extremities with 4/5 hemiparesis; normal strength of left extremities; normal muscle tone throughout. Sensory: Normal throughout. Deep Tendon Reflexes: 2+ and symmetric. Plantars: Flexor bilaterally Cerebellar: Slightly impaired coordination involving right upper extremity. Carotid auscultation: Normal  ASSESSMENT/PLAN Mr. [name] is a 64 y.o. male with history of hypertension presenting with right sided weakness. He did not receive IV t-PA due to being outside of window.   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  MRI  Subacute ischemic infarct involving the superior left basal  ganglia with cephalad extension into the periventricular white matter of the left corona radiata. No associated hemorrhage or significant mass effect. 2. Tiny remote left pontine and left basal ganglia lacunar infarcts. 3. Atrophy with moderate chronic microvascular ischemic disease.   MRA   Normal intracranial MRA without evidence of hemodynamically significant stenosis or proximal branch occlusion.   Carotid Doppler  1-39% ICA stenosis. Vertebral artery flow is antegrade.  2D Echo EF 65% no clot, no shunt  LDL 80  HgbA1c 5.5  Lovenox for VTE prophylaxis  Diet Heart   no antithrombotic prior to admission, now on aspirin 325 mg orally every day  Patient counseled to be compliant with his antithrombotic medications  Ongoing aggressive stroke risk factor management  Therapy recommendations:  Acute rehab  Disposition: Acute Rehab  Hypertension  Home meds:   Nil of note  Unstable  Patient counseled to be compliant with his blood pressure medications  Hyperlipidemia  Home meds: nil of note started in hospital  LDL 80,  goal < 70  Add Lipitor 10 mgs   Continue statin at discharge  Diabetes  HgbA1c 5.5, goal < 7.0  Controlled  Other Stroke Risk Factors  Advanced age  Obesity, Body mass index is 24.97 kg/(m^2).   Family hx stroke (mother)  Patient chart and images have been reviewed Patient seen and discussed with Dr. Leonie Man Sign off. F/U in 2 months in stroke clinic.  Hospital day # [place], [facility] 09/24/2014 10:23 AM    To contact Stroke Continuity provider, please refer to [email or web address] After hours, contact General Neurology

## 2014-09-24 NOTE — PMR Pre-admission (Signed)
PMR Admission Coordinator Pre-Admission Assessment  Patient: [name] is an 64 y.o., male MRN: [id number] DOB: [birth year] Height: 5\' 10"  (177.8 cm) Weight: 78.926 kg (174 lb)              Insurance Information HMO:     PPO: yes     PCP:      IPA:      80/20:      OTHER: medicare advantage plan PRIMARY: [employer] Medicare      Policy#: [id number]      Subscriber: pt CM Name: Rebbeca Paul    Phone#: [phone]     Fax#: [phone] Pre-Cert#: [id number]      Employer: retired approved for 7 days Benefits:  Phone #: [phone]     Name: 09/24/2014 Eff. Date: 08/02/14     Deduct: none      Out of Pocket Max: $4950      Life Max: none CIR: $285 copay per day days 1-6      SNF: no copay days 1-20; $160 copay days 21-100 Outpatient: $40 copay per visit     Co-Pay: no visit limit Home Health: 100%      Co-Pay: no visit limit DME: 80%     Co-Pay: 20% Providers: in network  SECONDARY: Medicaid [place]      Policy#: [id number]      Subscriber: pt  Medicaid Application Date:       Case Manager:  Disability Application Date:       Case Worker:   Emergency Contact Information Contact Information    Name Relation Home Work [place] Sister [phone]  [phone]     Current Medical History  Patient Admitting Diagnosis: left basal ganglia infarct  History of Present Illness:[name] is a 64 y.o. RH-male with history OA, glaucoma, untreated HTN who was admitted on 09/21/14 with new onset of right sided weakness with difficulty walking for about a week PTA. UDS positive for THC. 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 Work up ongoing and neurology recommends ASA for secondary stroke prevention. Pt new to BP meds.  NIH .Total: 0    Past Medical History  Past Medical History  Diagnosis Date   . Knee injury     right knee injury   . Hypertension   . Glaucoma   . Cataracts, bilateral     Family History  family history includes Cancer in his mother; Diabetes Mellitus II in his mother; Stroke in his mother; Stuttering in an other family member.  Prior Rehab/Hospitalizations: HH after L knee surgery after an accident at work which disabled him  Current Medications   Current facility-administered medications:  .  amLODipine (NORVASC) tablet 10 mg, 10 mg, Oral, Daily, Orson Eva, MD, 10 mg at 09/25/14 1025 .  aspirin suppository 300 mg, 300 mg, Rectal, Daily **OR** aspirin tablet 325 mg, 325 mg, Oral, Daily, Rise Patience, MD, 325 mg at 09/25/14 1025 .  atorvastatin (LIPITOR) tablet 10 mg, 10 mg, Oral, q1800, Orson Eva, MD, 10 mg at 09/24/14 1723 .  enoxaparin (LOVENOX) injection 40 mg, 40 mg, Subcutaneous, QHS, Rise Patience, MD, 40 mg at 09/24/14 2127 .  hydrALAZINE (APRESOLINE) injection 10 mg, 10 mg, Intravenous, Q4H PRN, Rise Patience, MD .  metoprolol (LOPRESSOR) tablet 50 mg, 50 mg, Oral, BID, Orson Eva, MD, 50  mg at 09/25/14 1025 .  senna-docusate (Senokot-S) tablet 1 tablet, 1 tablet, Oral, QHS PRN, Rise Patience, MD, 1 tablet at 09/23/14 2120  Patients Current Diet: Diet Heart Diet - low sodium heart healthy  Precautions / Restrictions Precautions Precautions: Fall Other Brace/Splint: Patient wears bil knee braces, "I've been wearing them for a long time, had problems with my knees" Restrictions Weight Bearing Restrictions: No   Prior Activity Level Community (5-7x/wk): does not drive, independent with cane. 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 Sister was a special Occupational psychologist in [place]. 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 Never drove.  Home Assistive Devices / Equipment Home Assistive Devices/Equipment: Civil engineer, contracting without back, Eyeglasses, Radio producer (specify quad or  straight) Home Equipment: Cane - quad  Prior Functional Level Prior Function Level of Independence: Independent with assistive device(s) Comments: Uses quad cane.  Reports he had been going to gym to use weights, and walking.  Current Functional Level Cognition  Overall Cognitive Status: Within Functional Limits for tasks assessed Orientation Level: Oriented X4    Extremity Assessment (includes Sensation/Coordination)  Upper Extremity Assessment: RUE deficits/detail RUE Deficits / Details: decreased coordination and overall strength throughout RUE  Lower Extremity Assessment: Defer to PT evaluation RLE Deficits / Details: Strength grossly 4+/5.  Varus knee position in standing.  Wears knee brace. RLE Coordination: decreased gross motor LLE Deficits / Details: Strength grossly 4+/5.  Wears knee brace - less supportive than RLE brace.  Knee in varus position during gait. LLE Coordination: decreased gross motor    ADLs  Overall ADL's : Needs assistance/impaired Eating/Feeding: Set up Grooming: Min guard, Standing Upper Body Bathing: Minimal assitance, Sitting Lower Body Bathing: Minimal assistance, Sit to/from stand, Cueing for safety Upper Body Dressing : Minimal assistance, Sitting Lower Body Dressing: Minimal assistance, Sit to/from stand, Cueing for safety Toilet Transfer: Minimal assistance, Grab bars, Ambulation Toilet Transfer Details (indicate cue type and reason): using quad cane Toileting- Clothing Manipulation and Hygiene: Sit to/from stand, Min guard Functional mobility during ADLs: Minimal assistance, Cueing for safety (using quad cane) General ADL Comments: Patient able to bring BLEs to self for LB ADLs. Patient with increased vargus during standing and functional ambulation. During functional ambulation with quad cane, patient tended to furniture walk and was very unsteady on feet. RUE uncoordinated during functional activity. According to patient he was getting around  a lot better prior to this CVA.     Mobility  Overal bed mobility: Needs Assistance Bed Mobility: Supine to Sit, Sit to Supine Supine to sit: Min guard, HOB elevated Sit to supine: Min guard, HOB elevated General bed mobility comments: Use of rails for support.    Transfers  Overall transfer level: Needs assistance Equipment used: None Transfers: Sit to/from Stand Sit to Stand: Min assist General transfer comment: Use of momentum to stand. Cues for anterior translation and hand placement to assist with standing technique. LOB x3 to the left upon standing.    Ambulation / Gait / Stairs / Wheelchair Mobility  Ambulation/Gait Ambulation/Gait assistance: Museum/gallery curator (Feet): 75 Feet (+ 50') Assistive device: Rolling walker (2 wheeled) (rail) Gait Pattern/deviations: Step-through pattern, Decreased stride length, Staggering left, Staggering right, Decreased dorsiflexion - right Gait velocity: Decreased Gait velocity interpretation: Below normal speed for age/gender General Gait Details: Ambulated with RW - manual assist for advancement of RLE to facilitate hip flexion. Ambulated with rail requiring constant Min A for balance and assist with advancement  of RLE.    Posture / Balance Balance Overall balance assessment: Needs assistance Sitting-balance support: Feet supported, No upper extremity supported Sitting balance-Leahy Scale: Good Standing balance support: During functional activity Standing balance-Leahy Scale: Fair Standing balance comment: Able to perform static standing for short periods without UE support, requires UE support for ambulation.    Special needs/care consideration Bowel mgmt continent Bladder mgmt: continent Initial PCP appointment 10/07/14 with Dr. Vincente Liberty   Previous Home Environment Living Arrangements: Other (Comment) (lives with his sister since his Mom died 26 from Environmental consultant)  Lives With: Other (Comment) (sister) Available  Help at Discharge: Family, Available 24 hours/day Type of Home: Apartment Home Layout: One level Home Access: Stairs to enter Entrance Stairs-Rails: Left Entrance Stairs-Number of Steps:  (lives in second floor apartment) Bathroom Shower/Tub: Tub/shower unit, Curtain Bathroom Toilet: Standard Bathroom Accessibility: Yes How Accessible: Accessible via walker [place]: No Additional Comments: Pt has appt with Dr Vincente Liberty  on 3/7 to set up PCP  Discharge Living Setting Plans for Discharge Living Setting: Lives with (comment), Apartment, Other (Comment) (lives with sister in second floor apartment) Type of Home at Discharge: Apartment Discharge Home Layout: One level Discharge Home Access: Stairs to enter Entrance Stairs-Rails: Left Entrance Stairs-Number of Steps: 14 steps or a flight of steps into apartment Discharge Bathroom Shower/Tub: Tub/shower unit, Curtain Discharge Bathroom Toilet: Standard Discharge Bathroom Accessibility: Yes How Accessible: Accessible via walker Does the patient have any problems obtaining your medications?: No  Social/Family/Support Systems Contact Information: [name]. 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 Has always lived with Mom and sister Pt/Family Agrees to Admission and willing to participate:  Yes Program Orientation Provided & Reviewed with Pt/Caregiver Including Roles  & Responsibilities: Yes   Decrease burden of Care through IP rehab admission: n/a  Possible need for SNF placement upon discharge:not anticipated  Patient Condition: This patient's condition remains as documented in the consult dated 09/24/2014, in which the Rehabilitation Physician determined and documented that the patient's condition is appropriate for intensive rehabilitative care in an inpatient rehabilitation facility. Will admit to inpatient rehab today.  Preadmission Screen Completed By:  Cleatrice Burke, 09/25/2014 2:03 PM ______________________________________________________________________   Discussed status with Dr. Naaman Plummer on 09/25/2014 at  1425 and received telephone approval for admission today.  Admission Coordinator:  Cleatrice Burke, time 1610 Date 09/25/2014.

## 2014-09-24 NOTE — Progress Notes (Signed)
Speech Language Pathology Treatment: Cognitive-Linquistic  Patient Details Name: [name] MRN: [id number] DOB: [birth year] Today's Date: 09/24/2014 Time: 2992-4268 SLP Time Calculation (min) (ACUTE ONLY): 11 min  Assessment / Plan / Recommendation Clinical Impression  Goal of session was to provide cognitive-linguistic therapy. SLP facilitated conversation level speech during session with the use of pictures. Pt's speech was much more fluent than noted in prior session. Pt did not demonstrate word finding difficulties during this session. Speech will follow-up.   HPI HPI: Pt is a 64 y/o male with a PMHx of HTN, who presents to the ED with complaints of right-sided weakness that began on Monday. Patient reports that he called the ambulance, who noted that his blood pressure was high and recomended pt see primary care doctor; did not trasnport. Pt went to urgent care on 09/21/14, who directed him to the emergency room for possible stroke. Patient reports that he does not take any medications for his blood pressure because he does not go to a primary care doctor. He reports that the right arm and right leg have been weak since Monday, with no improvement during the course of the week. He reports some tingling in the right upper extremity. Sister at bedside reports some difficulties with his word finding; report pt has a stutter at baseline therefore difficult to determine whether he is truly having word finding difficulties. They deny any facial droop, asymmetry, or syncope. Patient denies any fevers, chills, neck pain or stiffness, headache, vision changes, dizziness, lightheadedness, chest pain, shortness of breath, abdominal pain, nausea, vomiting, diarrhea, constipation, dysuria, hematuria, or numbness. Reports that he has been having difficulty walking due to the right leg weakness. MRI revealed subacute ischemic infarct involving the superior left basal ganglia with cephalad extension into the  periventricular white matter of the left corona radiata. No associated hemorrhage or significant mass effect. Tiny remote left pontine and left basal ganglia lacunar infarcts. Atrophy with moderate chronic microvascular ischemic disease.   Pertinent Vitals Pain Assessment: No/denies pain  SLP Plan  Continue with current plan of care    Recommendations                General recommendations: Rehab consult Oral Care Recommendations: Oral care BID Follow up Recommendations: Inpatient Rehab Plan: Continue with current plan of care    GO     Bermudez-Bosch, Kerry-Anne Mezo 09/24/2014, 1:24 PM

## 2014-09-24 NOTE — Discharge Summary (Addendum)
Physician Discharge Summary  [name] YTK:[id number] DOB: 20-Jul-1951 DOA: 09/21/2014  PCP: Leola Brazil, MD  Admit date: 09/21/2014 Discharge date: 09/24/2014  Recommendations for Outpatient Follow-up:  Pt will need to follow up with PCP after discharge from inpatient rehab  Discharge Diagnoses:  Subacute stroke left basal ganglia -MRI brain--subacute left infarct basal ganglia, remote pontine infarct -MRA brain--no hemodynamically significant stenosis -Hemoglobin A1c--5.5 -LDL--80 -Carotid duplex--no hemodynamically significant stenosis -Echocardiogram--EF 55-73%, grade 1 diastolic dysfunction, no WMA -PT/OT/ST-->CIR -Appreciate neurology consult -Continue aspirin 325mg  daily Hypertension -increase amlodipine 10mg  -increase metoprolol 50 mg bid -continue to monitor BP and titrate meds for optimal control Dyslipidemia -Start statin--lipitor 10 -LDL 80 Marijuana use -Cessation discussed Thrombocytopenia -Serum B12--1339 -HIV--neg Transaminasemia -Hepatitis B surface antigen--neg -Hepatitis C antibody--neg -RUQ us--neg GB and neg liver -outpt followup  Discharge Condition: stable  Disposition: CIR Follow-up Information    Follow up with SETHI,PRAMOD, MD In 2 months.   Specialties:  Neurology, Radiology   Why:  If symptoms worsen   Contact information:   [address] [place] [address] [phone]       Diet:heart healthy Wt Readings from Last 3 Encounters:  09/21/14 78.926 kg (174 lb)    History of present illness:   64 year old male with a history of hypertension presented with one-week history of gait instability, difficulty walking, and right-sided weakness of his arms greater than his legs. The patient also had some slurred speech. In the emergency department, CT of the brain showed a subacute stroke. The patient was admitted for a full stroke workup. MRI of the brain revealed subacute left basal ganglia infarct with  remote pontine and left basal ganglia infarcts.  Neurology was consulted and full neurologic workup was undertaken.  See results above and below PT/OT/ST were consulted and recommended CIR.  PM&R was consulted and agreed that pt was appropriate for CIR.  The pt was transferred to CIR.  He was started on antihypertensive meds which will need to be titrated up for optimal BP control.  He was statin which will be continued   Consultants: Neurology PM&R  Discharge Exam: Filed Vitals:   09/24/14 1804  BP: 185/84  Pulse: 93  Temp: 98 F (36.7 C)  Resp: 20   Filed Vitals:   09/24/14 0531 09/24/14 1100 09/24/14 1335 09/24/14 1804  BP: 172/96 168/91 148/84 185/84  Pulse: 62 66 62 93  Temp: 97.7 F (36.5 C) 97.6 F (36.4 C) 98.4 F (36.9 C) 98 F (36.7 C)  TempSrc: Oral Oral Oral Oral  Resp: 20 22 20 20   Height:      Weight:      SpO2: 100% 100% 100% 100%   General: A&O x 3, NAD, pleasant, cooperative Cardiovascular: RRR, no rub, no gallop, no S3 Respiratory: CTAB, no wheeze, no rhonchi Abdomen:soft, nontender, nondistended, positive bowel sounds Extremities: No edema, No lymphangitis, no petechiae  Discharge Instructions      Discharge Instructions    Diet - low sodium heart healthy    Complete by:  As directed      Increase activity slowly    Complete by:  As directed             Medication List    TAKE these medications        amLODipine 10 MG tablet  Commonly known as:  NORVASC  Take 1 tablet (10 mg total) by mouth daily.     aspirin 325 MG tablet  Take 1 tablet (325 mg total)  by mouth daily.     atorvastatin 10 MG tablet  Commonly known as:  LIPITOR  Take 1 tablet (10 mg total) by mouth daily at 6 PM.     metoprolol 50 MG tablet  Commonly known as:  LOPRESSOR  Take 1 tablet (50 mg total) by mouth 2 (two) times daily.     ONE-A-DAY MENS 50+ ADVANTAGE PO  Take 1 tablet by mouth daily.     OVER THE COUNTER MEDICATION  Take 4 tablets by mouth daily.  truheart         The results of significant diagnostics from this hospitalization (including imaging, microbiology, ancillary and laboratory) are listed below for reference.    Significant Diagnostic Studies: Dg Chest 2 View  09/22/2014   CLINICAL DATA:  Stroke protocol.  EXAM: CHEST  2 VIEW  COMPARISON:  None  FINDINGS: There is normal heart size. No pleural effusion or edema. No airspace consolidation or atelectasis noted. Calcified atherosclerotic plaque involves the thoracic aorta.  IMPRESSION: No active cardiopulmonary abnormalities.   Electronically Signed   By: Kerby Moors M.D.   On: 09/22/2014 09:32   Ct Head Wo Contrast  09/21/2014   CLINICAL DATA:  Elevated blood pressure. Right-sided weakness for 1 week. History of hypertension.  EXAM: CT HEAD WITHOUT CONTRAST  TECHNIQUE: Contiguous axial images were obtained from the base of the skull through the vertex without intravenous contrast.  COMPARISON:  None.  FINDINGS: Ventricles are normal in size and configuration.  There are no parenchymal masses or mass effect.  There is an area of hypoattenuation in the left centrum semiovale, likely due to chronic microvascular ischemic change. It could, potentially, reflect a subacute white matter infarction.  There other patchy areas of white matter hypoattenuation consistent with mild to moderate chronic microvascular ischemic change.  There is no evidence of a transcortical infarct.  There are no extra-axial masses or abnormal fluid collections.  There is no intracranial hemorrhage.  Visualized sinuses and mastoid air cells are clear. No skull lesion.  IMPRESSION: 1. Possible subacute white matter infarct along the left centrum semiovale. This could be further assessed with brain MRI if desired clinically. 2. No other evidence of a recent infarct. No intracranial hemorrhage. 3. Mild moderate chronic microvascular ischemic change.   Electronically Signed   By: Lajean Manes M.D.   On: 09/21/2014 17:25     Mr Brain Wo Contrast  09/22/2014   CLINICAL DATA:  Initial evaluation for acute onset right-sided weakness that began on Monday. Evaluate for stroke.  EXAM: MRI HEAD WITHOUT CONTRAST  MRA HEAD WITHOUT CONTRAST  TECHNIQUE: Multiplanar, multiecho pulse sequences of the brain and surrounding structures were obtained without intravenous contrast. Angiographic images of the head were obtained using MRA technique without contrast.  COMPARISON:  Prior CT earlier the same day.  FINDINGS: MRI HEAD FINDINGS  Mild diffuse prominence of the CSF containing spaces is compatible with generalized cerebral atrophy. Patchy and confluent T2/FLAIR hyperintensity within the periventricular and deep white matter both cerebral hemispheres most consistent with moderate chronic small vessel ischemic disease.  Small remote left pontine infarct noted. Additional small remote loop donor infarct within the posterior left lentiform nucleus. There is chronic blood products with the left basal ganglia infarct.  There is an area mildly increased signal intensity on DWI sequence located in the superior left basal ganglia extending into the periventricular white matter of the left corona radiata (series 4, image 30). This infarct is likely subacute nature, as the ADC  signal abnormality has largely normalized. No associated hemorrhage or significant mass effect. No other infarct.  No mass lesion or midline shift. Ventricles are normal size without hydrocephalus. No extra-axial fluid collection.  Craniocervical junction is normal. Pituitary gland within normal limits.  No acute abnormality seen about the orbits.  Paranasal sinuses are clear.  No mastoid effusion.  Bone marrow signal intensity within normal limits. Visualized upper cervical spine unremarkable. Scalp soft tissues within normal limits.  MRA HEAD FINDINGS  ANTERIOR CIRCULATION:  The distal cervical segments of the internal carotid arteries are widely patent with antegrade flow. The  petrous, cavernous, and supra clinoid segments are well opacified without significant stenosis. A1 segments, anterior communicating artery, and anterior cerebral arteries widely patent. Median callosal branch noted. M1 segments widely patent without significant stenosis or occlusion. MCA bifurcation is normal. Distal MCA branches within normal limits.  POSTERIOR CIRCULATION:  Left vertebral artery is dominant and widely patent to the vertebrobasilar junction. Diminutive right vertebral artery divides at the right posterior inferior cerebellar artery, with a small hypoplastic branch ascending torus the vertebrobasilar junction. The right posterior inferior cerebral artery is patent. The left posterior inferior cerebral artery is not visualized. Basilar artery widely patent. Anterior inferior cerebral arteries are patent bilaterally. The left AICA is dominant. Superior cerebellar arteries widely patent. P1 and P2 segments widely patent.  No aneurysm.  IMPRESSION: MRI HEAD IMPRESSION:  1. Subacute ischemic infarct involving the superior left basal ganglia with cephalad extension into the periventricular white matter of the left corona radiata. No associated hemorrhage or significant mass effect. 2. Tiny remote left pontine and left basal ganglia lacunar infarcts. 3. Atrophy with moderate chronic microvascular ischemic disease.  MRA HEAD IMPRESSION:  Normal intracranial MRA without evidence of hemodynamically significant stenosis or proximal branch occlusion.   Electronically Signed   By: Jeannine Boga M.D.   On: 09/22/2014 07:10   Mr Jodene Nam Head/brain Wo Cm  09/22/2014   CLINICAL DATA:  Initial evaluation for acute onset right-sided weakness that began on Monday. Evaluate for stroke.  EXAM: MRI HEAD WITHOUT CONTRAST  MRA HEAD WITHOUT CONTRAST  TECHNIQUE: Multiplanar, multiecho pulse sequences of the brain and surrounding structures were obtained without intravenous contrast. Angiographic images of the head were  obtained using MRA technique without contrast.  COMPARISON:  Prior CT earlier the same day.  FINDINGS: MRI HEAD FINDINGS  Mild diffuse prominence of the CSF containing spaces is compatible with generalized cerebral atrophy. Patchy and confluent T2/FLAIR hyperintensity within the periventricular and deep white matter both cerebral hemispheres most consistent with moderate chronic small vessel ischemic disease.  Small remote left pontine infarct noted. Additional small remote loop donor infarct within the posterior left lentiform nucleus. There is chronic blood products with the left basal ganglia infarct.  There is an area mildly increased signal intensity on DWI sequence located in the superior left basal ganglia extending into the periventricular white matter of the left corona radiata (series 4, image 30). This infarct is likely subacute nature, as the ADC signal abnormality has largely normalized. No associated hemorrhage or significant mass effect. No other infarct.  No mass lesion or midline shift. Ventricles are normal size without hydrocephalus. No extra-axial fluid collection.  Craniocervical junction is normal. Pituitary gland within normal limits.  No acute abnormality seen about the orbits.  Paranasal sinuses are clear.  No mastoid effusion.  Bone marrow signal intensity within normal limits. Visualized upper cervical spine unremarkable. Scalp soft tissues within normal limits.  MRA HEAD FINDINGS  ANTERIOR CIRCULATION:  The distal cervical segments of the internal carotid arteries are widely patent with antegrade flow. The petrous, cavernous, and supra clinoid segments are well opacified without significant stenosis. A1 segments, anterior communicating artery, and anterior cerebral arteries widely patent. Median callosal branch noted. M1 segments widely patent without significant stenosis or occlusion. MCA bifurcation is normal. Distal MCA branches within normal limits.  POSTERIOR CIRCULATION:  Left  vertebral artery is dominant and widely patent to the vertebrobasilar junction. Diminutive right vertebral artery divides at the right posterior inferior cerebellar artery, with a small hypoplastic branch ascending torus the vertebrobasilar junction. The right posterior inferior cerebral artery is patent. The left posterior inferior cerebral artery is not visualized. Basilar artery widely patent. Anterior inferior cerebral arteries are patent bilaterally. The left AICA is dominant. Superior cerebellar arteries widely patent. P1 and P2 segments widely patent.  No aneurysm.  IMPRESSION: MRI HEAD IMPRESSION:  1. Subacute ischemic infarct involving the superior left basal ganglia with cephalad extension into the periventricular white matter of the left corona radiata. No associated hemorrhage or significant mass effect. 2. Tiny remote left pontine and left basal ganglia lacunar infarcts. 3. Atrophy with moderate chronic microvascular ischemic disease.  MRA HEAD IMPRESSION:  Normal intracranial MRA without evidence of hemodynamically significant stenosis or proximal branch occlusion.   Electronically Signed   By: Jeannine Boga M.D.   On: 09/22/2014 07:10   US Abdomen Limited Ruq  09/23/2014   CLINICAL DATA:  Elevated liver transaminases.  EXAM: US ABDOMEN LIMITED - RIGHT UPPER QUADRANT  COMPARISON:  None.  FINDINGS: Gallbladder:  No gallstones or wall thickening visualized. No sonographic Murphy sign noted.  Common bile duct:  Diameter: 2 mm  Liver:  No focal lesion identified. Within normal limits in parenchymal echogenicity.  [place] right renal cyst is incidentally noted.  IMPRESSION: Normal gallbladder.  Normal liver.  No bile duct dilation.  Midpole right renal cyst.   Electronically Signed   By: Lajean Manes M.D.   On: 09/23/2014 17:07     Microbiology: No results found for this or any previous visit (from the past 240 hour(s)).   Labs: Basic Metabolic Panel:  Recent Labs Lab 09/21/14 1553  09/22/14 0030 09/22/14 0710 09/23/14 0407 09/24/14 0449  NA 135  --  140 140 141  K 3.6  --  3.6 3.4* 3.6  CL 102  --  104 105 106  CO2 27  --  31 30 29   GLUCOSE 92  --  79 85 95  BUN 13  --  14 9 10   CREATININE 0.98 1.19 1.09 1.03 1.12  CALCIUM 8.7  --  8.8 8.7 8.8   Liver Function Tests:  Recent Labs Lab 09/21/14 1553 09/22/14 0710 09/23/14 0407 09/24/14 0449  AST 56* 58* 74* 58*  ALT 45 43 56* 48  ALKPHOS 62 57 56 52  BILITOT 0.9 0.9 0.8 0.7  PROT 7.2 6.0 6.1 6.0  ALBUMIN 3.7 3.0* 3.0* 2.9*   No results for input(s): LIPASE, AMYLASE in the last 168 hours. No results for input(s): AMMONIA in the last 168 hours. CBC:  Recent Labs Lab 09/21/14 1553 09/22/14 0030 09/22/14 0710 09/22/14 1335 09/23/14 0407  WBC 5.2 5.4 4.2  --  3.9*  NEUTROABS 3.4  --  2.1  --   --   HGB 15.3 13.7 13.6  --  13.7  HCT 45.4 41.4 40.8 42.1 41.6  MCV 91.7 93.9 94.2  --  93.5  PLT 175 152 143*  --  152   Cardiac Enzymes:  Recent Labs Lab 09/21/14 1553  TROPONINI <0.03   BNP: Invalid input(s): POCBNP CBG: No results for input(s): GLUCAP in the last 168 hours.  Time coordinating discharge:  Greater than 30 minutes  Signed:  Gianni Mihalik, DO [facility] Pager: [phone] 09/24/2014, 6:55 PM

## 2014-09-25 ENCOUNTER — Inpatient Hospital Stay (HOSPITAL_COMMUNITY)
Admission: RE | Admit: 2014-09-25 | Discharge: 2014-10-03 | DRG: 057 | Disposition: A | Discharge status: Home Health | Payer: Medicare HMO | Source: Intra-hospital | Attending: Physical Medicine & Rehabilitation | Admitting: Physical Medicine & Rehabilitation

## 2014-09-25 ENCOUNTER — Encounter (HOSPITAL_COMMUNITY): Payer: Self-pay | Admitting: *Deleted

## 2014-09-25 DIAGNOSIS — F1290 Cannabis use, unspecified, uncomplicated: Secondary | ICD-10-CM | POA: Diagnosis present

## 2014-09-25 DIAGNOSIS — I69351 Hemiplegia and hemiparesis following cerebral infarction affecting right dominant side: Secondary | ICD-10-CM | POA: Diagnosis present

## 2014-09-25 DIAGNOSIS — I10 Essential (primary) hypertension: Secondary | ICD-10-CM | POA: Diagnosis present

## 2014-09-25 DIAGNOSIS — I6350 Cerebral infarction due to unspecified occlusion or stenosis of unspecified cerebral artery: Secondary | ICD-10-CM

## 2014-09-25 DIAGNOSIS — Z87891 Personal history of nicotine dependence: Secondary | ICD-10-CM

## 2014-09-25 DIAGNOSIS — I6381 Other cerebral infarction due to occlusion or stenosis of small artery: Secondary | ICD-10-CM | POA: Diagnosis present

## 2014-09-25 DIAGNOSIS — M25561 Pain in right knee: Secondary | ICD-10-CM

## 2014-09-25 DIAGNOSIS — I639 Cerebral infarction, unspecified: Secondary | ICD-10-CM | POA: Diagnosis not present

## 2014-09-25 DIAGNOSIS — M25562 Pain in left knee: Secondary | ICD-10-CM

## 2014-09-25 DIAGNOSIS — R7989 Other specified abnormal findings of blood chemistry: Secondary | ICD-10-CM | POA: Diagnosis present

## 2014-09-25 DIAGNOSIS — M174 Other bilateral secondary osteoarthritis of knee: Secondary | ICD-10-CM | POA: Diagnosis present

## 2014-09-25 DIAGNOSIS — G8190 Hemiplegia, unspecified affecting unspecified side: Secondary | ICD-10-CM

## 2014-09-25 DIAGNOSIS — G8191 Hemiplegia, unspecified affecting right dominant side: Secondary | ICD-10-CM

## 2014-09-25 LAB — HCV RNA QUANT
HCV QUANT LOG: 6.61 {Log} — AB (ref <1.18)
HCV QUANT: 4083925 [IU]/mL — AB (ref <15)

## 2014-09-25 MED ORDER — BISACODYL 10 MG RE SUPP: 10.0000 mg | Freq: Every day | RECTAL | Status: DC | PRN

## 2014-09-25 MED ORDER — ENOXAPARIN SODIUM 40 MG/0.4ML ~~LOC~~ SOLN
40.0000 mg | SUBCUTANEOUS | Status: DC
  Administered 2014-09-26 – 2014-10-02 (×8): 40 mg via SUBCUTANEOUS
  Filled 2014-09-25 (×10): qty 0.4

## 2014-09-25 MED ORDER — ASPIRIN EC 325 MG PO TBEC
325.0000 mg | DELAYED_RELEASE_TABLET | Freq: Every day | ORAL | Status: DC
  Administered 2014-09-26 – 2014-10-03 (×8): 325 mg via ORAL
  Filled 2014-09-25 (×10): qty 1

## 2014-09-25 MED ORDER — PROCHLORPERAZINE EDISYLATE 5 MG/ML IJ SOLN
5.0000 mg | Freq: Four times a day (QID) | INTRAMUSCULAR | Status: DC | PRN
  Filled 2014-09-25: qty 2

## 2014-09-25 MED ORDER — ATORVASTATIN CALCIUM 10 MG PO TABS
10.0000 mg | ORAL_TABLET | Freq: Every day | ORAL | Status: DC
  Administered 2014-09-26 – 2014-10-02 (×7): 10 mg via ORAL
  Filled 2014-09-25 (×8): qty 1

## 2014-09-25 MED ORDER — METHOCARBAMOL 500 MG PO TABS: 500.0000 mg | ORAL_TABLET | Freq: Four times a day (QID) | ORAL | Status: DC | PRN

## 2014-09-25 MED ORDER — ALUM & MAG HYDROXIDE-SIMETH 200-200-20 MG/5ML PO SUSP: 30.0000 mL | ORAL | Status: DC | PRN

## 2014-09-25 MED ORDER — AMLODIPINE BESYLATE 10 MG PO TABS
10.0000 mg | ORAL_TABLET | Freq: Every day | ORAL | Status: DC
  Administered 2014-09-26 – 2014-10-03 (×8): 10 mg via ORAL
  Filled 2014-09-25 (×10): qty 1

## 2014-09-25 MED ORDER — PROCHLORPERAZINE MALEATE 5 MG PO TABS
5.0000 mg | ORAL_TABLET | Freq: Four times a day (QID) | ORAL | Status: DC | PRN
  Filled 2014-09-25: qty 2

## 2014-09-25 MED ORDER — ACETAMINOPHEN 325 MG PO TABS
325.0000 mg | ORAL_TABLET | ORAL | Status: DC | PRN
  Administered 2014-09-28 – 2014-09-29 (×2): 650 mg via ORAL
  Filled 2014-09-25 (×2): qty 2

## 2014-09-25 MED ORDER — METOPROLOL TARTRATE 50 MG PO TABS
50.0000 mg | ORAL_TABLET | Freq: Two times a day (BID) | ORAL | Status: DC
  Administered 2014-09-25 – 2014-09-30 (×11): 50 mg via ORAL
  Filled 2014-09-25 (×15): qty 1

## 2014-09-25 MED ORDER — SENNOSIDES-DOCUSATE SODIUM 8.6-50 MG PO TABS
1.0000 | ORAL_TABLET | Freq: Every evening | ORAL | Status: DC | PRN
  Administered 2014-09-26: 1 via ORAL
  Filled 2014-09-25: qty 1

## 2014-09-25 MED ORDER — PROCHLORPERAZINE 25 MG RE SUPP
12.5000 mg | Freq: Four times a day (QID) | RECTAL | Status: DC | PRN
  Filled 2014-09-25: qty 1

## 2014-09-25 MED ORDER — DIPHENHYDRAMINE HCL 12.5 MG/5ML PO ELIX: 12.5000 mg | ORAL_SOLUTION | Freq: Four times a day (QID) | ORAL | Status: DC | PRN

## 2014-09-25 MED ORDER — TRAZODONE HCL 50 MG PO TABS: 25.0000 mg | ORAL_TABLET | Freq: Every evening | ORAL | Status: DC | PRN

## 2014-09-25 MED ORDER — FLEET ENEMA 7-19 GM/118ML RE ENEM: 1.0000 | ENEMA | Freq: Once | RECTAL | Status: AC | PRN

## 2014-09-25 MED ORDER — GUAIFENESIN-DM 100-10 MG/5ML PO SYRP: 5.0000 mL | ORAL_SOLUTION | Freq: Four times a day (QID) | ORAL | Status: DC | PRN

## 2014-09-25 NOTE — Progress Notes (Signed)
Transported patient via wheelchair to [id number] for inpatient rehab at 1830. Patient was received by Sharyn Lull, RN and report provided.

## 2014-09-25 NOTE — Progress Notes (Signed)
I have insurance approval to admit pt to inpt rehab today and will make the arrangements. Pt and his sister at bedside and are in agreement. [phone]

## 2014-09-25 NOTE — H&P (Signed)
Physical Medicine and Rehabilitation Admission H&P   Chief Complaint  Patient presents with  . Right sided weakness   HPI: [name] is a 64 y.o. 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 UDS positive for THC. 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 Carotid dopplers without significant Work up ongoing and neurology recommends ASA for secondary stroke prevention. 2D echo done revealing EF 60-65% with moderate LVH and grade 1 diastolic dysfunction. Dr. 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 Patient with resultant RUE weakness with word finding deficits and CIR recommended by MD and rehab team for follow up therapy.     ROS  HENT: Negative for hearing loss.  Eyes: Positive for blurred vision (has bilateral cataracts). Negative for double vision.  Respiratory: Positive for shortness of breath (with exertion). Negative for cough.  Cardiovascular: Negative for chest pain and palpitations.  Gastrointestinal: Positive for heartburn and diarrhea. Negative for nausea and vomiting.  Genitourinary: Negative for dysuria and urgency.  Musculoskeletal: Positive for myalgias and joint pain (bilateral knee pain).  Neurological: Positive for sensory change and focal weakness. Negative for headaches.  Psychiatric/Behavioral: Positive for memory loss (per sister). The patient does not have insomnia.      Past Medical History  Diagnosis Date  . Knee injury     right knee injury   . Hypertension   . Glaucoma   .  Cataracts, bilateral     Past Surgical History  Procedure Laterality Date  . No past surgeries      Family History  Problem Relation Age of Onset  . Diabetes Mellitus II Mother   . Stroke Mother   . Cancer Mother   . Stuttering      Social History: Lives with sister. Disabled due to knee injury since 71's. Goes to the gym about 3 times a week and does weight and walks for exercise. He moved to [place] 2 years ago and was in process of being set up with Dr. Katherine Roan. He reports that he quit smoking in 1984. He does not have any smokeless tobacco history on file. He reports that he does not drink alcohol--quit in '83. His drug history is not on file.    Allergies: No Known Allergies    Medications Prior to Admission  Medication Sig Dispense Refill  . Multiple Vitamins-Minerals (ONE-A-DAY MENS 50+ ADVANTAGE PO) Take 1 tablet by mouth daily.    [name] OVER THE COUNTER MEDICATION Take 4 tablets by mouth daily. truheart      Home: Home Living Family/patient expects to be discharged to:: Private residence Living Arrangements: Other relatives (sister) Available Help at Discharge: Family, Available 24 hours/day Type of Home: Apartment Home Access: Stairs to enter [employer] of Steps: flight Entrance Stairs-Rails: Left (going up on the left) Home Layout: One level Home Equipment: Cane - quad Lives With: Family  Functional History: Prior Function Level of Independence: Independent with assistive device(s) Comments: Uses quad cane. Reports he had been going to gym to use weights, and walking.  Functional Status:  Mobility: Bed Mobility Overal bed mobility: Needs Assistance Bed Mobility: Supine to Sit, Sit to Supine  Supine to sit: Min guard, HOB elevated Sit to supine: Min guard, HOB elevated General bed mobility comments: Use of rails for support. Transfers Overall transfer level: Needs  assistance Equipment used: None Transfers: Sit to/from Stand Sit to Stand: Min assist General transfer comment: Use of momentum to stand. Cues for anterior translation and hand placement to assist with standing technique. LOB x2 to the left with impulsive standing.  Ambulation/Gait Ambulation/Gait assistance: Min assist Ambulation Distance (Feet): 75 Feet (+75') Assistive device: Quad cane, Rolling walker (2 wheeled) Gait Pattern/deviations: Step-through pattern, Decreased stride length, Staggering left, Staggering right, Wide base of support Gait velocity: Decreased Gait velocity interpretation: Below normal speed for age/gender General Gait Details: Ambulated with quad cane - staggering to right/left. Difficulty obtaining gait pattern with quad cane progressed to use of rail for support - Min A for stability. Ambulated with RW and balance improved however required cues for RW proximity and negotiation. Difficulty with turns.    ADL: ADL Overall ADL's : Needs assistance/impaired Eating/Feeding: Set up Grooming: Min guard, Standing Upper Body Bathing: Minimal assitance, Sitting Lower Body Bathing: Minimal assistance, Sit to/from stand, Cueing for safety Upper Body Dressing : Minimal assistance, Sitting Lower Body Dressing: Minimal assistance, Sit to/from stand, Cueing for safety Toilet Transfer: Minimal assistance, Grab bars, Ambulation Toilet Transfer Details (indicate cue type and reason): using quad cane Toileting- Clothing Manipulation and Hygiene: Sit to/from stand, Min guard Functional mobility during ADLs: Minimal assistance, Cueing for safety (using quad cane) General ADL Comments: Patient able to bring BLEs to self for LB ADLs. Patient with increased vargus during standing and functional ambulation. During functional ambulation with quad cane, patient tended to furniture walk and was very unsteady on feet. RUE uncoordinated during functional activity. According to patient he was  getting around a lot better prior to this CVA.   Cognition: Cognition Overall Cognitive Status: Within Functional Limits for tasks assessed Orientation Level: Oriented X4 Cognition Arousal/Alertness: Awake/alert Behavior During Therapy: WFL for tasks assessed/performed Overall Cognitive Status: Within Functional Limits for tasks assessed    Blood pressure 168/91, pulse 66, temperature 97.6 F (36.4 C), temperature source Oral, resp. rate 22, height '5\' 10"'  (1.778 m), weight 78.926 kg (174 lb), SpO2 100 %. Physical Exam Nursing note and vitals reviewed. Constitutional: He is oriented to person, place, and time. He appears well-developed and well-nourished.  HENT:  Head: Normocephalic and atraumatic.  Eyes: Conjunctivae are normal. Pupils are equal, round, and reactive to light.  Neck: Normal range of motion. Neck supple.  Cardiovascular: Normal rate and regular rhythm.  No murmur heard. Respiratory: Effort normal and breath sounds normal. No respiratory distress. He has no wheezes.  GI: Soft. Bowel sounds are normal. He exhibits no distension. There is no tenderness.  Musculoskeletal:  Severe varus deformities, right more than left knees. Has bilateral knee orthoses. Both are worn. He has pain with active and passive flexion as well as weightbearing.  Neurological: He is alert and oriented to person, place, and time. He displays normal reflexes.  Mild dysarthria. Able to follow one and two step commands. RUE: 4/5 delt, bicep, tricep, HI, RLE: 4- HF, 4/5 KE and ADF/APF. LUE: 5/5 delt, bicep,tricep, HI. LLE: 4+ prox to distal. No sensory deficits.  Skin: Skin is warm and dry. No rash noted. No erythema.  Psychiatric:  More alert. Attentive and engaged today.     Lab Results Last 48 Hours    Results for orders placed or performed during the hospital encounter of 09/21/14 (from  the past 48 hour(s))  Vitamin B12 Status: Abnormal   Collection Time: 09/22/14 1:35 PM   Result Value Ref Range   Vitamin B-12 >2000 (H) 211 - 911 pg/mL    Comment: Performed at [facility]  HIV antibody Status: None   Collection Time: 09/22/14 1:35 PM  Result Value Ref Range   HIV Screen 4th Generation wRfx Non Reactive Non Reactive    Comment: (NOTE) Performed At: [facility] [place], [place] [id number] Lindon Romp MD [id number]   Hepatitis C antibody Status: Abnormal   Collection Time: 09/22/14 1:35 PM  Result Value Ref Range   HCV Ab Reactive (A) NEGATIVE    Comment: Performed at [facility]  Hepatitis B surface antigen Status: None   Collection Time: 09/22/14 1:35 PM  Result Value Ref Range   Hepatitis B Surface Ag NEGATIVE NEGATIVE    Comment: Performed at [facility]  Folate RBC Status: None   Collection Time: 09/22/14 1:35 PM  Result Value Ref Range   Folate, Hemolysate 563.8 Not Estab. ng/mL   Hematocrit 42.1 37.5 - 51.0 %   Folate, RBC 1339 >498 ng/mL    Comment: (NOTE) Performed At: [facility] [place], [place] [id number] Lindon Romp MD [id number]   CBC Status: Abnormal   Collection Time: 09/23/14 4:07 AM  Result Value Ref Range   WBC 3.9 (L) 4.0 - 10.5 K/uL   RBC 4.45 4.22 - 5.81 MIL/uL   Hemoglobin 13.7 13.0 - 17.0 g/dL   HCT 41.6 39.0 - 52.0 %   MCV 93.5 78.0 - 100.0 fL   MCH 30.8 26.0 - 34.0 pg   MCHC 32.9 30.0 - 36.0 g/dL   RDW 13.3 11.5 - 15.5 %   Platelets 152 150 - 400 K/uL  Comprehensive metabolic panel Status: Abnormal   Collection Time: 09/23/14 4:07 AM  Result Value Ref Range   Sodium 140 135 - 145 mmol/L   Potassium 3.4 (L) 3.5 - 5.1 mmol/L   Chloride 105 96 - 112 mmol/L   CO2 30 19 - 32 mmol/L   Glucose, Bld 85 70 - 99 mg/dL   BUN 9 6 - 23 mg/dL   Creatinine,  Ser 1.03 0.50 - 1.35 mg/dL   Calcium 8.7 8.4 - 10.5 mg/dL   Total Protein 6.1 6.0 - 8.3 g/dL   Albumin 3.0 (L) 3.5 - 5.2 g/dL   AST 74 (H) 0 - 37 U/L   ALT 56 (H) 0 - 53 U/L   Alkaline Phosphatase 56 39 - 117 U/L   Total Bilirubin 0.8 0.3 - 1.2 mg/dL   GFR calc non Af Amer 74 (L) >90 mL/min   GFR calc Af Amer 86 (L) >90 mL/min    Comment: (NOTE) The eGFR has been calculated using the CKD EPI equation. This calculation has not been validated in all clinical situations. eGFR's persistently <90 mL/min signify possible Chronic Kidney Disease.    Anion gap 5 5 - 15  Comprehensive metabolic panel Status: Abnormal   Collection Time: 09/24/14 4:49 AM  Result Value Ref Range   Sodium 141 135 - 145 mmol/L   Potassium 3.6 3.5 - 5.1 mmol/L   Chloride 106 96 - 112 mmol/L   CO2 29 19 - 32 mmol/L   Glucose, Bld 95 70 - 99 mg/dL   BUN 10 6 - 23 mg/dL   Creatinine, Ser 1.12 0.50 - 1.35 mg/dL   Calcium 8.8 8.4 - 10.5 mg/dL  Total Protein 6.0 6.0 - 8.3 g/dL   Albumin 2.9 (L) 3.5 - 5.2 g/dL   AST 58 (H) 0 - 37 U/L   ALT 48 0 - 53 U/L   Alkaline Phosphatase 52 39 - 117 U/L   Total Bilirubin 0.7 0.3 - 1.2 mg/dL   GFR calc non Af Amer 67 (L) >90 mL/min   GFR calc Af Amer 78 (L) >90 mL/min    Comment: (NOTE) The eGFR has been calculated using the CKD EPI equation. This calculation has not been validated in all clinical situations. eGFR's persistently <90 mL/min signify possible Chronic Kidney Disease.    Anion gap 6 5 - 15      Imaging Results (Last 48 hours)    US Abdomen Limited Ruq  09/23/2014 CLINICAL DATA: Elevated liver transaminases. EXAM: US ABDOMEN LIMITED - RIGHT UPPER QUADRANT COMPARISON: None. FINDINGS: Gallbladder: No gallstones or wall thickening visualized. No sonographic Murphy sign noted. Common bile duct: Diameter: 2 mm Liver: No focal  lesion identified. Within normal limits in parenchymal echogenicity. Midpole right renal cyst is incidentally noted. IMPRESSION: Normal gallbladder. Normal liver. No bile duct dilation. Midpole right renal cyst. Electronically Signed By: Lajean Manes M.D. On: 09/23/2014 17:07        Medical Problem List and Plan: 1. Functional deficits secondary to Left basal ganglia infarct.  2. DVT Prophylaxis/Anticoagulation: Pharmaceutical: Lovenox 3. Pain Management: continue to use knee braces. Ice and Tylenol prn for bilateral knee pain. -will ask [facility] to assess for new bilateral knee braces to control varus better and to decrease pain 4. Mood: No signs of distress noted. LCSW to follow for evaluation and support.  5. Neuropsych: This patient is is capable of making decisions on his own behalf. 6. Skin/Wound Care: Encourage pressure relief measures. Mobility. Maintain adequate nutritional and hydration status.  7. Fluids/Electrolytes/Nutrition: Monitor I/O. Check lytes in am.  8. Elevated LFTs: Improving--likely reactive. Hepatitis panel negative. Will recheck in am. Abdominal Ultrasound negative.  9. HTN: Will monitor tid. Continue Norvasc and metoprolol. Educate patient on importance on medication adherence.      Post Admission Physician Evaluation: 1. Functional deficits secondary to Left basal ganglia infarct.  2. Patient is admitted to receive collaborative, interdisciplinary care between the physiatrist, rehab nursing staff, and therapy team. 3. Patient's level of medical complexity and substantial therapy needs in context of that medical necessity cannot be provided at a lesser intensity of care such as a SNF. 4. Patient has experienced substantial functional loss from his/her baseline which was documented above under the "Functional History" and "Functional Status" headings. Judging by the patient's diagnosis, physical exam, and functional history, the  patient has potential for functional progress which will result in measurable gains while on inpatient rehab. These gains will be of substantial and practical use upon discharge in facilitating mobility and self-care at the household level. 5. Physiatrist will provide 24 hour management of medical needs as well as oversight of the therapy plan/treatment and provide guidance as appropriate regarding the interaction of the two. 6. 24 hour rehab nursing will assist with bladder management, bowel management, safety, skin/wound care, disease management, medication administration and pain management and help integrate therapy concepts, techniques,education, etc. 7. PT will assess and treat for/with: Lower extremity strength, range of motion, stamina, balance, functional mobility, safety, adaptive techniques and equipment, pain mgt, NMR, orthotics, stairs. Goals are: mod i. 8. OT will assess and treat for/with: ADL's, functional mobility, safety, upper extremity strength, adaptive techniques and equipment, NMR,  stroke education, family ed. Goals are: mod I. Therapy may proceed with showering this patient. 9. SLP will assess and treat for/with: cognition, communication. Goals are: mod I. 10. Case Management and Social Worker will assess and treat for psychological issues and discharge planning. 11. Team conference will be held weekly to assess progress toward goals and to determine barriers to discharge. 12. Patient will receive at least 3 hours of therapy per day at least 5 days per week. 13. ELOS: 7 days  14. Prognosis: excellent     Meredith Staggers, MD, [place] 09/25/2014

## 2014-09-25 NOTE — Progress Notes (Signed)
Meredith Staggers, MD Physician Signed Physical Medicine and Rehabilitation Consult Note 09/23/2014 8:35 AM  Related encounter: ED to Hosp-Admission (Current) from 09/21/2014 in [place] Collapse All        Physical Medicine and Rehabilitation Consult  Reason for Consult: Right sided weakness Referring Physician: Dr. Carles Collet   HPI: [name] is a 64 y.o. RH-male with history OA, glaucoma, untreated HTN who was admitted on 09/21/14 with new onset of right sided weakness with difficulty walking for about a week PTA. UDS positive for THC. 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 Work up ongoing and neurology recommends ASA for secondary stroke prevention. PT/OT evaluations completed today and CIR recommended by MD and Rehab team.    Review of Systems  HENT: Negative for hearing loss.  Eyes: Positive for blurred vision (has bilateral cataracts). Negative for double vision.  Respiratory: Positive for shortness of breath (with exertion). Negative for cough.  Cardiovascular: Negative for chest pain and palpitations.  Gastrointestinal: Positive for heartburn and diarrhea. Negative for nausea and vomiting.  Genitourinary: Negative for dysuria and urgency.  Musculoskeletal: Positive for myalgias and joint pain (bilateral knee pain).  Neurological: Positive for sensory change and focal weakness. Negative for headaches.  Psychiatric/Behavioral: Positive for memory loss (per sister). The patient does not have insomnia.      Past Medical History  Diagnosis Date  . Knee injury     right knee injury   . Hypertension   . Glaucoma   . Cataracts, bilateral     Past Surgical History  Procedure Laterality Date  . No past  surgeries      Family History  Problem Relation Age of Onset  . Diabetes Mellitus II Mother   . Stroke Mother   . Cancer Mother   . Stuttering      Social History: Lives with sister. Disabled since 4's due to injury. Moved to [place] 2 years ago and in process of being set up with Dr. Katherine Roan. He reports that he quit smoking in 1984. He does not have any smokeless tobacco history on file. He reports that he does not drink alcohol--quit in '83. His drug history is not on file.    Allergies: No Known Allergies    Medications Prior to Admission  Medication Sig Dispense Refill  . Multiple Vitamins-Minerals (ONE-A-DAY MENS 50+ ADVANTAGE PO) Take 1 tablet by mouth daily.    [name] OVER THE COUNTER MEDICATION Take 4 tablets by mouth daily. truheart      Home: Home Living Family/patient expects to be discharged to:: Private residence Living Arrangements: Other relatives (sister) Available Help at Discharge: Family, Available 24 hours/day Type of Home: Apartment Home Access: Stairs to enter [employer] of Steps: flight Entrance Stairs-Rails: Left (going up on the left) Home Layout: One level Home Equipment: Cane - quad  Functional History: Prior Function Level of Independence: Independent with assistive device(s) Comments: Uses quad cane. Reports he had been going to gym to use weights, and walking. Functional Status:  Mobility: Bed Mobility Overal bed mobility: Needs Assistance Bed Mobility: Supine to Sit, Sit to Supine Supine to sit: Min assist Sit to supine: Min guard General bed mobility comments: Patient seatede EOB upon OT entering room Transfers Overall transfer level: Needs assistance Equipment used: Quad cane Transfers: Sit  to/from Stand Sit to Stand: Min assist General transfer comment: Patient using rocking motion to move to standing. Required 2 attempts. On second attempt, required mod assist to power  up to standing. Min assist to min guard for static standing balance. Ambulation/Gait Ambulation/Gait assistance: Min assist Ambulation Distance (Feet): 24 Feet Assistive device: Quad cane Gait Pattern/deviations: Step-to pattern, Decreased stride length, Staggering left, Staggering right, Trunk flexed, Wide base of support Gait velocity: Decreased Gait velocity interpretation: Below normal speed for age/gender General Gait Details: Patient with unsteady gait. Difficulty maneuvering quad cane with Rt UE. Patient staggering to both sides, requiring assist to maintain balance. Will try RW at next session.    ADL: ADL Overall ADL's : Needs assistance/impaired Eating/Feeding: Set up Grooming: Min guard, Standing Upper Body Bathing: Minimal assitance, Sitting Lower Body Bathing: Minimal assistance, Sit to/from stand, Cueing for safety Upper Body Dressing : Minimal assistance, Sitting Lower Body Dressing: Minimal assistance, Sit to/from stand, Cueing for safety Toilet Transfer: Minimal assistance, Grab bars, Ambulation Toilet Transfer Details (indicate cue type and reason): using quad cane Toileting- Clothing Manipulation and Hygiene: Sit to/from stand, Min guard Functional mobility during ADLs: Minimal assistance, Cueing for safety (using quad cane) General ADL Comments: Patient able to bring BLEs to self for LB ADLs. Patient with increased vargus during standing and functional ambulation. During functional ambulation with quad cane, patient tended to furniture walk and was very unsteady on feet. RUE uncoordinated during functional activity. According to patient he was getting around a lot better prior to this CVA.   Cognition: Cognition Overall Cognitive Status: Within Functional Limits for tasks assessed (decreased awareness at times) Orientation Level: Oriented X4 Cognition Arousal/Alertness: Awake/alert Behavior During Therapy: WFL for tasks assessed/performed Overall Cognitive  Status: Within Functional Limits for tasks assessed (decreased awareness at times)  Blood pressure 171/95, pulse 70, temperature 97.9 F (36.6 C), temperature source Oral, resp. rate 16, height 5\' 10"  (1.778 m), weight 78.926 kg (174 lb), SpO2 100 %. Physical Exam  Nursing note and vitals reviewed. Constitutional: He is oriented to person, place, and time. He appears well-developed and well-nourished.  HENT:  Head: Normocephalic and atraumatic.  Eyes: Conjunctivae are normal. Pupils are equal, round, and reactive to light.  Neck: Normal range of motion. Neck supple.  Cardiovascular: Normal rate and regular rhythm.  No murmur heard. Respiratory: Effort normal and breath sounds normal. No respiratory distress. He has no wheezes.  GI: Soft. Bowel sounds are normal. He exhibits no distension. There is no tenderness.  Musculoskeletal:  Severe varus deformities, right more than left knees. Has bilateral knee orthoses  Neurological: He is alert and oriented to person, place, and time. He displays normal reflexes.  Mild dysarthria. Able to follow one and two step commands. RUE: 4/5 delt, bicep, tricep, HI, RLE: 4- HF, 4/5 KE and ADF/APF. LUE: 5/5 delt, bicep,tricep, HI. LLE: 4+ prox to distal. No sensory deficits.  Skin: Skin is warm and dry. No rash noted. No erythema.  Psychiatric:  Flat, some delays in processing     Lab Results Last 24 Hours    Results for orders placed or performed during the hospital encounter of 09/21/14 (from the past 24 hour(s))  Vitamin B12 Status: Abnormal   Collection Time: 09/22/14 1:35 PM  Result Value Ref Range   Vitamin B-12 >2000 (H) 211 - 911 pg/mL  Hepatitis C antibody Status: Abnormal   Collection Time: 09/22/14 1:35 PM  Result Value Ref Range   HCV Ab Reactive (A) NEGATIVE  Hepatitis  B surface antigen Status: None   Collection Time: 09/22/14 1:35 PM  Result Value Ref Range   Hepatitis B Surface Ag  NEGATIVE NEGATIVE  CBC Status: Abnormal   Collection Time: 09/23/14 4:07 AM  Result Value Ref Range   WBC 3.9 (L) 4.0 - 10.5 K/uL   RBC 4.45 4.22 - 5.81 MIL/uL   Hemoglobin 13.7 13.0 - 17.0 g/dL   HCT 41.6 39.0 - 52.0 %   MCV 93.5 78.0 - 100.0 fL   MCH 30.8 26.0 - 34.0 pg   MCHC 32.9 30.0 - 36.0 g/dL   RDW 13.3 11.5 - 15.5 %   Platelets 152 150 - 400 K/uL  Comprehensive metabolic panel Status: Abnormal   Collection Time: 09/23/14 4:07 AM  Result Value Ref Range   Sodium 140 135 - 145 mmol/L   Potassium 3.4 (L) 3.5 - 5.1 mmol/L   Chloride 105 96 - 112 mmol/L   CO2 30 19 - 32 mmol/L   Glucose, Bld 85 70 - 99 mg/dL   BUN 9 6 - 23 mg/dL   Creatinine, Ser 1.03 0.50 - 1.35 mg/dL   Calcium 8.7 8.4 - 10.5 mg/dL   Total Protein 6.1 6.0 - 8.3 g/dL   Albumin 3.0 (L) 3.5 - 5.2 g/dL   AST 74 (H) 0 - 37 U/L   ALT 56 (H) 0 - 53 U/L   Alkaline Phosphatase 56 39 - 117 U/L   Total Bilirubin 0.8 0.3 - 1.2 mg/dL   GFR calc non Af Amer 74 (L) >90 mL/min   GFR calc Af Amer 86 (L) >90 mL/min   Anion gap 5 5 - 15      Imaging Results (Last 48 hours)    Dg Chest 2 View  09/22/2014 CLINICAL DATA: Stroke protocol. EXAM: CHEST 2 VIEW COMPARISON: None FINDINGS: There is normal heart size. No pleural effusion or edema. No airspace consolidation or atelectasis noted. Calcified atherosclerotic plaque involves the thoracic aorta. IMPRESSION: No active cardiopulmonary abnormalities. Electronically Signed By: Kerby Moors M.D. On: 09/22/2014 09:32   Ct Head Wo Contrast  09/21/2014 CLINICAL DATA: Elevated blood pressure. Right-sided weakness for 1 week. History of hypertension. EXAM: CT HEAD WITHOUT CONTRAST TECHNIQUE: Contiguous axial images were obtained from the base of the skull through the vertex without intravenous contrast. COMPARISON: None. FINDINGS:  Ventricles are normal in size and configuration. There are no parenchymal masses or mass effect. There is an area of hypoattenuation in the left centrum semiovale, likely due to chronic microvascular ischemic change. It could, potentially, reflect a subacute white matter infarction. There other patchy areas of white matter hypoattenuation consistent with mild to moderate chronic microvascular ischemic change. There is no evidence of a transcortical infarct. There are no extra-axial masses or abnormal fluid collections. There is no intracranial hemorrhage. Visualized sinuses and mastoid air cells are clear. No skull lesion. IMPRESSION: 1. Possible subacute white matter infarct along the left centrum semiovale. This could be further assessed with brain MRI if desired clinically. 2. No other evidence of a recent infarct. No intracranial hemorrhage. 3. Mild moderate chronic microvascular ischemic change. Electronically Signed By: Lajean Manes M.D. On: 09/21/2014 17:25   Mr Brain Wo Contrast  09/22/2014 CLINICAL DATA: Initial evaluation for acute onset right-sided weakness that began on Monday. Evaluate for stroke. EXAM: MRI HEAD WITHOUT CONTRAST MRA HEAD WITHOUT CONTRAST TECHNIQUE: Multiplanar, multiecho pulse sequences of the brain and surrounding structures were obtained without intravenous contrast. Angiographic images of the head were obtained using MRA technique without  contrast. COMPARISON: Prior CT earlier the same day. FINDINGS: MRI HEAD FINDINGS Mild diffuse prominence of the CSF containing spaces is compatible with generalized cerebral atrophy. Patchy and confluent T2/FLAIR hyperintensity within the periventricular and deep white matter both cerebral hemispheres most consistent with moderate chronic small vessel ischemic disease. Small remote left pontine infarct noted. Additional small remote loop donor infarct within the posterior left lentiform nucleus. There is chronic blood  products with the left basal ganglia infarct. There is an area mildly increased signal intensity on DWI sequence located in the superior left basal ganglia extending into the periventricular white matter of the left corona radiata (series 4, image 30). This infarct is likely subacute nature, as the ADC signal abnormality has largely normalized. No associated hemorrhage or significant mass effect. No other infarct. No mass lesion or midline shift. Ventricles are normal size without hydrocephalus. No extra-axial fluid collection. Craniocervical junction is normal. Pituitary gland within normal limits. No acute abnormality seen about the orbits. Paranasal sinuses are clear. No mastoid effusion. Bone marrow signal intensity within normal limits. Visualized upper cervical spine unremarkable. Scalp soft tissues within normal limits. MRA HEAD FINDINGS ANTERIOR CIRCULATION: The distal cervical segments of the internal carotid arteries are widely patent with antegrade flow. The petrous, cavernous, and supra clinoid segments are well opacified without significant stenosis. A1 segments, anterior communicating artery, and anterior cerebral arteries widely patent. Median callosal branch noted. M1 segments widely patent without significant stenosis or occlusion. MCA bifurcation is normal. Distal MCA branches within normal limits. POSTERIOR CIRCULATION: Left vertebral artery is dominant and widely patent to the vertebrobasilar junction. Diminutive right vertebral artery divides at the right posterior inferior cerebellar artery, with a small hypoplastic branch ascending torus the vertebrobasilar junction. The right posterior inferior cerebral artery is patent. The left posterior inferior cerebral artery is not visualized. Basilar artery widely patent. Anterior inferior cerebral arteries are patent bilaterally. The left AICA is dominant. Superior cerebellar arteries widely patent. P1 and P2 segments widely patent. No  aneurysm. IMPRESSION: MRI HEAD IMPRESSION: 1. Subacute ischemic infarct involving the superior left basal ganglia with cephalad extension into the periventricular white matter of the left corona radiata. No associated hemorrhage or significant mass effect. 2. Tiny remote left pontine and left basal ganglia lacunar infarcts. 3. Atrophy with moderate chronic microvascular ischemic disease. MRA HEAD IMPRESSION: Normal intracranial MRA without evidence of hemodynamically significant stenosis or proximal branch occlusion. Electronically Signed By: Jeannine Boga M.D. On: 09/22/2014 07:10   Mr Jodene Nam Head/brain Wo Cm  09/22/2014 CLINICAL DATA: Initial evaluation for acute onset right-sided weakness that began on Monday. Evaluate for stroke. EXAM: MRI HEAD WITHOUT CONTRAST MRA HEAD WITHOUT CONTRAST TECHNIQUE: Multiplanar, multiecho pulse sequences of the brain and surrounding structures were obtained without intravenous contrast. Angiographic images of the head were obtained using MRA technique without contrast. COMPARISON: Prior CT earlier the same day. FINDINGS: MRI HEAD FINDINGS Mild diffuse prominence of the CSF containing spaces is compatible with generalized cerebral atrophy. Patchy and confluent T2/FLAIR hyperintensity within the periventricular and deep white matter both cerebral hemispheres most consistent with moderate chronic small vessel ischemic disease. Small remote left pontine infarct noted. Additional small remote loop donor infarct within the posterior left lentiform nucleus. There is chronic blood products with the left basal ganglia infarct. There is an area mildly increased signal intensity on DWI sequence located in the superior left basal ganglia extending into the periventricular white matter of the left corona radiata (series 4, image 30). This infarct is likely subacute  nature, as the ADC signal abnormality has largely normalized. No associated hemorrhage or significant  mass effect. No other infarct. No mass lesion or midline shift. Ventricles are normal size without hydrocephalus. No extra-axial fluid collection. Craniocervical junction is normal. Pituitary gland within normal limits. No acute abnormality seen about the orbits. Paranasal sinuses are clear. No mastoid effusion. Bone marrow signal intensity within normal limits. Visualized upper cervical spine unremarkable. Scalp soft tissues within normal limits. MRA HEAD FINDINGS ANTERIOR CIRCULATION: The distal cervical segments of the internal carotid arteries are widely patent with antegrade flow. The petrous, cavernous, and supra clinoid segments are well opacified without significant stenosis. A1 segments, anterior communicating artery, and anterior cerebral arteries widely patent. Median callosal branch noted. M1 segments widely patent without significant stenosis or occlusion. MCA bifurcation is normal. Distal MCA branches within normal limits. POSTERIOR CIRCULATION: Left vertebral artery is dominant and widely patent to the vertebrobasilar junction. Diminutive right vertebral artery divides at the right posterior inferior cerebellar artery, with a small hypoplastic branch ascending torus the vertebrobasilar junction. The right posterior inferior cerebral artery is patent. The left posterior inferior cerebral artery is not visualized. Basilar artery widely patent. Anterior inferior cerebral arteries are patent bilaterally. The left AICA is dominant. Superior cerebellar arteries widely patent. P1 and P2 segments widely patent. No aneurysm. IMPRESSION: MRI HEAD IMPRESSION: 1. Subacute ischemic infarct involving the superior left basal ganglia with cephalad extension into the periventricular white matter of the left corona radiata. No associated hemorrhage or significant mass effect. 2. Tiny remote left pontine and left basal ganglia lacunar infarcts. 3. Atrophy with moderate chronic microvascular ischemic  disease. MRA HEAD IMPRESSION: Normal intracranial MRA without evidence of hemodynamically significant stenosis or proximal branch occlusion. Electronically Signed By: Jeannine Boga M.D. On: 09/22/2014 07:10     Assessment/Plan: Diagnosis: left basal ganglia infarct 1. Does the need for close, 24 hr/day medical supervision in concert with the patient's rehab needs make it unreasonable for this patient to be served in a less intensive setting? Yes 2. Co-Morbidities requiring supervision/potential complications: bilateral severe OA of knees, uncontrolled HTN, thrombocytopenia 3. Due to bladder management, bowel management, safety, skin/wound care, disease management, medication administration, pain management and patient education, does the patient require 24 hr/day rehab nursing? Yes 4. Does the patient require coordinated care of a physician, rehab nurse, PT (1-2 hrs/day, 5 days/week), OT (1-2 hrs/day, 5 days/week) and SLP (1-2 hrs/day, 5 days/week) to address physical and functional deficits in the context of the above medical diagnosis(es)? Yes Addressing deficits in the following areas: balance, endurance, locomotion, strength, transferring, bowel/bladder control, bathing, dressing, feeding, grooming, toileting, cognition, speech and psychosocial support 5. Can the patient actively participate in an intensive therapy program of at least 3 hrs of therapy per day at least 5 days per week? Yes 6. The potential for patient to make measurable gains while on inpatient rehab is excellent 7. Anticipated functional outcomes upon discharge from inpatient rehab are modified independent and supervision with PT, modified independent and supervision with OT, modified independent with SLP. 8. Estimated rehab length of stay to reach the above functional goals is: 7-10 days 9. Does the patient have adequate social supports and living environment to accommodate these discharge functional goals?  Yes 10. Anticipated D/C setting: Home 11. Anticipated post D/C treatments: [place] therapy 12. Overall Rehab/Functional Prognosis: excellent  RECOMMENDATIONS: This patient's condition is appropriate for continued rehabilitative care in the following setting: CIR Patient has agreed to participate in recommended program. Yes Note that  insurance prior authorization may be required for reimbursement for recommended care.  Comment: Pt is motivated. Has good social supports. There is a full flight of steps to enter his second floor, one level apt. Rehab Admissions Coordinator to follow up.  Thanks,  Meredith Staggers, MD, Mellody Drown     09/23/2014       Revision History     Date/Time User Provider Type Action   09/24/2014 7:34 AM Meredith Staggers, MD Physician Sign   09/23/2014 10:10 AM Bary Leriche, PA-C Physician Assistant [place]   View Details Report       Routing History     Date/Time From To Method   09/24/2014 7:34 AM Meredith Staggers, MD Meredith Staggers, MD In Basket   09/24/2014 7:34 AM Meredith Staggers, MD Leola Brazil., MD Fax

## 2014-09-25 NOTE — Progress Notes (Signed)
Patient seen, discharge summary has been done for transfer to inpatient rehabilitation. Vitals are stable, no new complaints today.  Chest clear to auscultation bilaterally Heart is S1-S2 regular in rate and rhythm Extremities no edema of the lower extremities  Patient to be discharged today to the inpatient rehabilitation.

## 2014-09-25 NOTE — Progress Notes (Signed)
PMR Admission Coordinator Pre-Admission Assessment  Patient: [name] is an 64 y.o., male MRN: [id number] DOB: [birth year] Height: 5\' 10"  (177.8 cm) Weight: 78.926 kg (174 lb)  Insurance Information HMO: PPO: yes PCP: IPA: 80/20: OTHER: medicare advantage plan PRIMARY: [employer] Medicare Policy#: [id number] Subscriber: pt CM Name: Rebbeca Paul Phone#: [phone] Fax#: [phone] Pre-Cert#: [id number] Employer: retired approved for 7 days Benefits: Phone #: [phone] Name: 09/24/2014 Eff. Date: 08/02/14 Deduct: none Out of Pocket Max: $4950 Life Max: none CIR: $285 copay per day days 1-6 SNF: no copay days 1-20; $160 copay days 21-100 Outpatient: $40 copay per visit Co-Pay: no visit limit Home Health: 100% Co-Pay: no visit limit DME: 80% Co-Pay: 20% Providers: in network  SECONDARY: Medicaid of [place] Policy#: [id number] k Subscriber: pt  Medicaid Application Date: Case Manager:  Disability Application Date: Case Worker:   Emergency Contact Information Contact Information    Name Relation Home Work [place] Sister [phone]  [phone]     Current Medical History  Patient Admitting Diagnosis: left basal ganglia infarct  History of Present Illness:[name] is a 64 y.o. RH-male with history OA, glaucoma, untreated HTN who was admitted on 09/21/14 with new onset of right sided weakness with difficulty walking for about a week PTA. UDS positive for THC. 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 Work up ongoing and neurology  recommends ASA for secondary stroke prevention. Pt new to BP meds.  NIH .Total: 0    Past Medical History  Past Medical History  Diagnosis Date  . Knee injury     right knee injury   . Hypertension   . Glaucoma   . Cataracts, bilateral     Family History  family history includes Cancer in his mother; Diabetes Mellitus II in his mother; Stroke in his mother; Stuttering in an other family member.  Prior Rehab/Hospitalizations: HH after L knee surgery after an accident at work which disabled him  Current Medications   Current facility-administered medications:  . amLODipine (NORVASC) tablet 10 mg, 10 mg, Oral, Daily, Orson Eva, MD, 10 mg at 09/25/14 1025 . aspirin suppository 300 mg, 300 mg, Rectal, Daily **OR** aspirin tablet 325 mg, 325 mg, Oral, Daily, Rise Patience, MD, 325 mg at 09/25/14 1025 . atorvastatin (LIPITOR) tablet 10 mg, 10 mg, Oral, q1800, Orson Eva, MD, 10 mg at 09/24/14 1723 . enoxaparin (LOVENOX) injection 40 mg, 40 mg, Subcutaneous, QHS, Rise Patience, MD, 40 mg at 09/24/14 2127 . hydrALAZINE (APRESOLINE) injection 10 mg, 10 mg, Intravenous, Q4H PRN, Rise Patience, MD . metoprolol (LOPRESSOR) tablet 50 mg, 50 mg, Oral, BID, Orson Eva, MD, 50 mg at 09/25/14 1025 . senna-docusate (Senokot-S) tablet 1 tablet, 1 tablet, Oral, QHS PRN, Rise Patience, MD, 1 tablet at 09/23/14 2120  Patients Current Diet: Diet Heart Diet - low sodium heart healthy  Precautions / Restrictions Precautions Precautions: Fall Other Brace/Splint: Patient wears bil knee braces, "I've been wearing them for a long time, had problems with my knees" Restrictions Weight Bearing Restrictions: No   Prior Activity Level Community (5-7x/wk): does not drive, independent with cane. 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 Sister was a special Occupational psychologist in [place]. 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 Never drove.  Home Assistive Devices / Paramedic  Devices/Equipment: Civil engineer, contracting without back, Eyeglasses, Radio producer (specify quad or straight) Home Equipment: Cane - quad  Prior Functional Level Prior Function Level of Independence: Independent with assistive device(s) Comments: Uses quad cane. Reports he had been going to gym to use weights, and walking.  Current Functional Level Cognition  Overall Cognitive Status: Within Functional Limits for tasks assessed Orientation Level: Oriented X4   Extremity Assessment (includes Sensation/Coordination)  Upper Extremity Assessment: RUE deficits/detail RUE Deficits / Details: decreased coordination and overall strength throughout RUE  Lower Extremity Assessment: Defer to PT evaluation RLE Deficits / Details: Strength grossly 4+/5. Varus knee position in standing. Wears knee brace. RLE Coordination: decreased gross motor LLE Deficits / Details: Strength grossly 4+/5. Wears knee brace - less supportive than RLE brace. Knee in varus position during gait. LLE Coordination: decreased gross motor    ADLs  Overall ADL's : Needs assistance/impaired Eating/Feeding: Set up Grooming: Min guard, Standing Upper Body Bathing: Minimal assitance, Sitting Lower Body Bathing: Minimal assistance, Sit to/from stand, Cueing for safety Upper Body Dressing : Minimal assistance, Sitting Lower Body Dressing: Minimal assistance, Sit to/from stand, Cueing for safety Toilet Transfer: Minimal assistance, Grab bars, Ambulation Toilet Transfer Details (indicate cue type and reason): using quad cane Toileting- Clothing Manipulation and Hygiene: Sit to/from stand, Min guard Functional mobility during ADLs: Minimal assistance, Cueing for safety (using quad cane) General ADL Comments: Patient able to bring BLEs to self for LB ADLs. Patient with increased vargus during standing and functional ambulation. During functional ambulation  with quad cane, patient tended to furniture walk and was very unsteady on feet. RUE uncoordinated during functional activity. According to patient he was getting around a lot better prior to this CVA.     Mobility  Overal bed mobility: Needs Assistance Bed Mobility: Supine to Sit, Sit to Supine Supine to sit: Min guard, HOB elevated Sit to supine: Min guard, HOB elevated General bed mobility comments: Use of rails for support.    Transfers  Overall transfer level: Needs assistance Equipment used: None Transfers: Sit to/from Stand Sit to Stand: Min assist General transfer comment: Use of momentum to stand. Cues for anterior translation and hand placement to assist with standing technique. LOB x3 to the left upon standing.    Ambulation / Gait / Stairs / Wheelchair Mobility  Ambulation/Gait Ambulation/Gait assistance: Museum/gallery curator (Feet): 75 Feet (+ 50') Assistive device: Rolling walker (2 wheeled) (rail) Gait Pattern/deviations: Step-through pattern, Decreased stride length, Staggering left, Staggering right, Decreased dorsiflexion - right Gait velocity: Decreased Gait velocity interpretation: Below normal speed for age/gender General Gait Details: Ambulated with RW - manual assist for advancement of RLE to facilitate hip flexion. Ambulated with rail requiring constant Min A for balance and assist with advancement of RLE.    Posture / Balance Balance Overall balance assessment: Needs assistance Sitting-balance support: Feet supported, No upper extremity supported Sitting balance-Leahy Scale: Good Standing balance support: During functional activity Standing balance-Leahy Scale: Fair Standing balance comment: Able to perform static standing for short periods without UE support, requires UE support for ambulation.    Special needs/care consideration Bowel mgmt continent Bladder mgmt: continent Initial PCP appointment 10/07/14 with Dr. Vincente Liberty    Previous Home Environment Living Arrangements: Other (Comment) (lives with his sister since his Mom died 29 from Environmental consultant) Lives With: Other (Comment) (sister) Available Help at Discharge: Family, Available 24 hours/day Type of Home: Apartment Home Layout: One level Home Access: Stairs to enter Entrance Stairs-Rails: Left Entrance Stairs-Number of Steps: (  lives in second floor apartment) Bathroom Shower/Tub: Tub/shower unit, Architectural technologist: Standard Bathroom Accessibility: Yes How Accessible: Accessible via walker [place]: No Additional Comments: Pt has appt with Dr Vincente Liberty on 3/7 to set up PCP  Discharge Living Setting Plans for Discharge Living Setting: Lives with (comment), Apartment, Other (Comment) (lives with sister in second floor apartment) Type of Home at Discharge: Apartment Discharge Home Layout: One level Discharge Home Access: Stairs to enter Entrance Stairs-Rails: Left Entrance Stairs-Number of Steps: 14 steps or a flight of steps into apartment Discharge Bathroom Shower/Tub: Tub/shower unit, Curtain Discharge Bathroom Toilet: Standard Discharge Bathroom Accessibility: Yes How Accessible: Accessible via walker Does the patient have any problems obtaining your medications?: No  Social/Family/Support Systems Contact Information: [name]. sister Anticipated Caregiver: sister Anticipated Caregiver's Contact Information: see above Ability/Limitations of Caregiver: sister is retired with no limitations Caregiver Availability: 24/7 Discharge Plan Discussed with Primary Caregiver: Yes Is Caregiver In Agreement with Plan?: Yes Does Caregiver/Family have Issues with Lodging/Transportation while Pt is in Rehab?: No  Goals/Additional Needs Patient/Family Goal for Rehab: Mod I to supervision with PT, OT, and SLP Expected length of stay: ELOS 7 to 10 days Special Service Needs: Sister reports pt is developmentally delayed and  slor since Measles as a child Additional Information: Pt manages his own money. Has always lived with Mom and sister Pt/Family Agrees to Admission and willing to participate: Yes Program Orientation Provided & Reviewed with Pt/Caregiver Including Roles & Responsibilities: Yes   Decrease burden of Care through IP rehab admission: n/a  Possible need for SNF placement upon discharge:not anticipated  Patient Condition: This patient's condition remains as documented in the consult dated 09/24/2014, in which the Rehabilitation Physician determined and documented that the patient's condition is appropriate for intensive rehabilitative care in an inpatient rehabilitation facility. Will admit to inpatient rehab today.  Preadmission Screen Completed By: Cleatrice Burke, 09/25/2014 2:03 PM ______________________________________________________________________  Discussed status with Dr. Naaman Plummer on 09/25/2014 at 1425 and received telephone approval for admission today.  Admission Coordinator: Cleatrice Burke, time 9480 Date 09/25/2014.          Cosigned by: Meredith Staggers, MD at 09/25/2014 2:33 PM  Revision History     Date/Time User Provider Type Action   09/25/2014 2:33 PM Meredith Staggers, MD Physician Cosign   09/25/2014 2:26 PM Cleatrice Burke, RN Rehab Admission Coordinator Sign

## 2014-09-26 ENCOUNTER — Inpatient Hospital Stay (HOSPITAL_COMMUNITY): Payer: Medicare HMO

## 2014-09-26 ENCOUNTER — Inpatient Hospital Stay (HOSPITAL_COMMUNITY): Payer: Medicare HMO | Admitting: Occupational Therapy

## 2014-09-26 ENCOUNTER — Inpatient Hospital Stay (HOSPITAL_COMMUNITY): Payer: Medicare HMO | Admitting: Physical Therapy

## 2014-09-26 ENCOUNTER — Inpatient Hospital Stay (HOSPITAL_COMMUNITY): Payer: Medicare HMO | Admitting: Speech Pathology

## 2014-09-26 DIAGNOSIS — I639 Cerebral infarction, unspecified: Secondary | ICD-10-CM

## 2014-09-26 LAB — CBC WITH DIFFERENTIAL/PLATELET
Basophils Absolute: 0 10*3/uL (ref 0.0–0.1)
Basophils Relative: 1 % (ref 0–1)
Eosinophils Absolute: 0.3 10*3/uL (ref 0.0–0.7)
Eosinophils Relative: 6 % — ABNORMAL HIGH (ref 0–5)
HEMATOCRIT: 43.7 % (ref 39.0–52.0)
HEMOGLOBIN: 14.5 g/dL (ref 13.0–17.0)
Lymphocytes Relative: 26 % (ref 12–46)
Lymphs Abs: 1.4 10*3/uL (ref 0.7–4.0)
MCH: 31.4 pg (ref 26.0–34.0)
MCHC: 33.2 g/dL (ref 30.0–36.0)
MCV: 94.6 fL (ref 78.0–100.0)
MONO ABS: 0.5 10*3/uL (ref 0.1–1.0)
MONOS PCT: 9 % (ref 3–12)
Neutro Abs: 3.2 10*3/uL (ref 1.7–7.7)
Neutrophils Relative %: 58 % (ref 43–77)
Platelets: 153 10*3/uL (ref 150–400)
RBC: 4.62 MIL/uL (ref 4.22–5.81)
RDW: 13.4 % (ref 11.5–15.5)
WBC: 5.5 10*3/uL (ref 4.0–10.5)

## 2014-09-26 LAB — COMPREHENSIVE METABOLIC PANEL
ALBUMIN: 2.9 g/dL — AB (ref 3.5–5.2)
ALK PHOS: 61 U/L (ref 39–117)
ALT: 47 U/L (ref 0–53)
AST: 50 U/L — ABNORMAL HIGH (ref 0–37)
Anion gap: 7 (ref 5–15)
BUN: 9 mg/dL (ref 6–23)
CALCIUM: 8.7 mg/dL (ref 8.4–10.5)
CO2: 27 mmol/L (ref 19–32)
Chloride: 104 mmol/L (ref 96–112)
Creatinine, Ser: 0.85 mg/dL (ref 0.50–1.35)
GFR calc Af Amer: >90 mL/min (ref >90)
GFR calc non Af Amer: 89 mL/min — ABNORMAL LOW (ref >90)
Glucose, Bld: 98 mg/dL (ref 70–99)
POTASSIUM: 3.4 mmol/L — AB (ref 3.5–5.1)
SODIUM: 138 mmol/L (ref 135–145)
TOTAL PROTEIN: 6.3 g/dL (ref 6.0–8.3)
Total Bilirubin: 0.7 mg/dL (ref 0.3–1.2)

## 2014-09-26 MED ORDER — POTASSIUM CHLORIDE CRYS ER 20 MEQ PO TBCR
20.0000 meq | EXTENDED_RELEASE_TABLET | Freq: Every day | ORAL | Status: AC
  Administered 2014-09-26 – 2014-09-28 (×3): 20 meq via ORAL
  Filled 2014-09-26 (×2): qty 1

## 2014-09-26 MED ORDER — DICLOFENAC SODIUM 1 % TD GEL
2.0000 g | Freq: Four times a day (QID) | TRANSDERMAL | Status: DC
  Administered 2014-09-26 – 2014-10-03 (×30): 2 g via TOPICAL
  Filled 2014-09-26: qty 100

## 2014-09-26 NOTE — Care Management Note (Signed)
Inpatient [facility] Individual Statement of Services  Patient Name:  [name]  Date:  09/26/2014  Welcome to the [facility].  Our goal is to provide you with an individualized program based on your diagnosis and situation, designed to meet your specific needs.  With this comprehensive rehabilitation program, you will be expected to participate in at least 3 hours of rehabilitation therapies Monday-Friday, with modified therapy programming on the weekends.  Your rehabilitation program will include the following services:  Physical Therapy (PT), Occupational Therapy (OT), Speech Therapy (ST), 24 hour per day rehabilitation nursing, Therapeutic Recreaction (TR), Case Management (Social Worker), Rehabilitation Medicine, Nutrition Services and Pharmacy Services  Weekly team conferences will be held on Wednesday to discuss your progress.  Your Social Worker will talk with you frequently to get your input and to update you on team discussions.  Team conferences with you and your family in attendance may also be held.  Expected length of stay: 7-10 days Overall anticipated outcome: mod/i level  Depending on your progress and recovery, your program may change. Your Social Worker will coordinate services and will keep you informed of any changes. Your Social Worker's name and contact numbers are listed  below.  The following services may also be recommended but are not provided by the [facility]:   [facility] will be made to provide these services after discharge if needed.  Arrangements include referral to agencies that provide these services.  Your insurance has been verified to be:  [place] Medicaid Your primary doctor is:  [facility]-new patient  Pertinent information will be shared with your doctor and your insurance company.  Social Worker:  Ovidio Kin, [place] or ([phone]  Information discussed with and copy given to patient by: Elease Hashimoto, 09/26/2014, 1:29 PM

## 2014-09-26 NOTE — Progress Notes (Signed)
Patient information reviewed and entered into eRehab system by Niel Peretti, RN, CRRN, PPS Coordinator.  Information including medical coding and functional independence measure will be reviewed and updated through discharge.     Per nursing patient was given "Data Collection Information Summary for Patients in Inpatient Rehabilitation Facilities with attached "Privacy Act Statement-Health Care Records" upon admission.  

## 2014-09-26 NOTE — Progress Notes (Signed)
Social Work Assessment and Plan Social Work Assessment and Plan  Patient Details  Name: [name] MRN: [id number] Date of Birth: 1951-03-03  Today's Date: 09/26/2014  Problem List:  Patient Active Problem List   Diagnosis Date Noted  . Right hemiparesis 09/25/2014  . Other secondary osteoarthritis of both knees 09/25/2014  . Basal ganglia infarction 09/25/2014  . Ischemic stroke 09/22/2014  . Dyslipidemia 09/22/2014  . Thrombocytopenia 09/22/2014  . Essential hypertension   . Stroke 09/21/2014  . Uncontrolled hypertension 09/21/2014   Past Medical History:  Past Medical History  Diagnosis Date  . Knee injury     right knee injury   . Hypertension   . Glaucoma   . Cataracts, bilateral    Past Surgical History:  Past Surgical History  Procedure Laterality Date  . No past surgeries     Social History:  reports that he quit smoking about 31 years ago. His smoking use included Cigarettes. He does not have any smokeless tobacco history on file. He reports that he does not drink alcohol. His drug history is not on file.  Family / Support Systems Marital Status: Single Patient Roles: Other (Comment) (sibling) Other Supports: Deborah-sister  [phone] Anticipated Caregiver: Sister Ability/Limitations of Caregiver: Sister has knee issues herself, but can provide assist Caregiver Availability: 24/7 Family Dynamics: Pt moved down here to be with his sister, since she was retired and able to provide supervision to pt.  Pt has always lived with someone whether it was his Mom or sister now.  Pt has been " slow" since he had measles as a child.  Social History Preferred language: English Religion:  Cultural Background: No issues Education: High School Read: Yes Write: Yes Employment Status: Disabled Date Retired/Disabled/Unemployed: Fell at work and hurt his knees-became disabled then Freight forwarder Issues: No  issues Guardian/Conservator: None-according to MD pt is capable of making his own decisions    Abuse/Neglect Physical Abuse: Denies Verbal Abuse: Denies Sexual Abuse: Denies Exploitation of patient/patient's resources: Denies Self-Neglect: Denies  Emotional Status Pt's affect, behavior adn adjustment status: Pt is motivated to improve and feels good after today.  He states: " I'm not one to sit still or lay around, it felt good to move around."  He has always been able to take care of himself never lived alone but could take care of his own needs.  Sister confirms this. Recent Psychosocial Issues: Other health issues-knees, wears braces for stability Pyschiatric History: No history deferred depression screen pt feels he is doing well and intervention is not needed. Substance Abuse History: Pt was positive for THC he reports he does this once in a while, but plans to stop.  Patient / Family Perceptions, Expectations & Goals Pt/Family understanding of illness & functional limitations: Pt and sister talk with MD and have a good understanding of his stroke and his deficits.  His sister does ask the questions and explains to pt if necessary.  Sister to come in a attend therapies with pt tomorrow. 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, Interior and spatial designer, retiree, etc Anticipated changes in roles/activities/participation: resume Pt/family expectations/goals: Pt states: " I feel better after today, I am moving better than I thought I would."  Sister states: " I hope he can move around on his own and get up the flight of stairs at home."  [employer]: None Premorbid Home Care/DME Agencies: Other (Comment) (Had in the past) Transportation available at discharge: Sister Resource referrals recommended: Support group (specify)  Discharge Planning  Living Arrangements: Other relatives Support Systems: Other relatives, Friends/neighbors Type of Residence: Private  residence Insurance Resources: Multimedia programmer (specify), Medicaid (specify county) Doctor, general practice) Museum/gallery curator Resources: [employer] Screen Referred: No Living Expenses: Lives with family Money Management: Family Does the patient have any problems obtaining your medications?: Yes (Describe) (Wasn't taking any meds-didn't go to the MD) Home Management: Sister does home management and drives also Patient/Family Preliminary Plans: Return home wht sister who can provide supervision level, she does have some knee issues fo her own.  Pt needs to get up the flight of stairs since the aparmtent is on the secodn floor.  Will await therapies evaluations. Social Work Anticipated Follow Up Needs: HH/OP, Support Group  Clinical Impression Pleasant gentleman who has learned to now go to the MD and take your blood pressure medicine.  His sister is supportive and assists in his care prior to admission. Hopefully pt can get to mod/i and be able to go up flight of steps to be able to return home with sister.  Sister reports he has been slow since having measles as a child. He has Always lived with a family member.  Await team's goals and will work on a discharge plan.  Elease Hashimoto 09/26/2014, 3:26 PM

## 2014-09-26 NOTE — Progress Notes (Signed)
Orthopedic Tech Progress Note Patient Details:  [name] 07-15-1951 [id number]  Patient ID: [name], male   DOB: 1951-01-17, 64 y.o.   MRN: [id number] Called in advanced brace order; spoke with Jane Canary, Ahmad Vanwey 09/26/2014, 9:27 AM

## 2014-09-26 NOTE — Plan of Care (Signed)
Problem: RH PAIN MANAGEMENT Goal: RH STG PAIN MANAGED AT OR BELOW PT'S PAIN GOAL < 3 on 0-10 pain scale.  Outcome: Progressing Bilateral knee discomfort managed with Voltaren gel tid

## 2014-09-26 NOTE — Progress Notes (Addendum)
64 y.o. 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 UDS positive for THC. 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 Carotid dopplers without significant Work up ongoing and neurology recommends ASA for secondary stroke prevention. 2D echo done revealing EF 60-65% with moderate LVH and grade 1 diastolic dysfunction. Dr. Leonie Man felt that patient with left subcortical infarct secondary to small vessel disease   Subjective/Complaints: Pt states he was on disability for knee problems, had arthroscopic surgery Left knee  Objective: Vital Signs: Blood pressure 163/94, pulse 81, temperature 99 F (37.2 C), temperature source Oral, resp. rate 16, height 5\' 10"  (1.778 m), weight 73.029 kg (161 lb), SpO2 100 %. No results found. Results for orders placed or performed during the hospital encounter of 09/25/14 (from the past 72 hour(s))  CBC WITH DIFFERENTIAL     Status: Abnormal   Collection Time: 09/26/14  5:55 AM  Result Value Ref Range   WBC 5.5 4.0 - 10.5 K/uL   RBC 4.62 4.22 - 5.81 MIL/uL   Hemoglobin 14.5 13.0 - 17.0 g/dL   HCT 43.7 39.0 - 52.0 %   MCV 94.6 78.0 - 100.0 fL   MCH 31.4 26.0 - 34.0 pg   MCHC 33.2 30.0 - 36.0 g/dL   RDW 13.4 11.5 - 15.5 %   Platelets 153 150 - 400 K/uL   Neutrophils Relative % 58 43 - 77 %   Neutro Abs 3.2 1.7 - 7.7 K/uL   Lymphocytes Relative 26 12 - 46 %   Lymphs Abs 1.4 0.7 - 4.0 K/uL   Monocytes Relative 9 3 - 12 %   Monocytes Absolute 0.5 0.1 - 1.0 K/uL   Eosinophils Relative 6 (H) 0 - 5 %   Eosinophils Absolute 0.3 0.0 - 0.7 K/uL   Basophils Relative 1 0 - 1 %   Basophils Absolute 0.0 0.0 - 0.1 K/uL     HEENT:  normal Cardio: RRR Resp: CTA B/L GI: BS positive Extremity:  No Edema Skin:   Intact Neuro: Alert/Oriented, Flat, Normal Sensory, Abnormal Motor 4/5 Right delt, bi, tri, grip, HF, KN ankle DF and Abnormal FMC Ataxic/ dec FMC Musc/Skel:  Other varus deformity Left knee with Left knee effusion Gen NAD   Assessment/Plan: 1. Functional deficits secondary to Left basal ganglia infarct which require 3+ hours per day of interdisciplinary therapy in a comprehensive inpatient rehab setting. Physiatrist is providing close team supervision and 24 hour management of active medical problems listed below. Physiatrist and rehab team continue to assess barriers to discharge/monitor patient progress toward functional and medical goals. FIM:                   Comprehension Comprehension Mode: Auditory Comprehension: 4-Understands basic 75 - 89% of the time/requires cueing 10 - 24% of the time  Expression Expression Mode: Verbal Expression: 5-Expresses basic needs/ideas: With extra time/assistive device  Social Interaction Social Interaction: 5-Interacts appropriately 90% of the time - Needs monitoring or encouragement for participation or interaction.  Problem Solving Problem Solving: 4-Solves basic 75 - 89% of the time/requires cueing 10 - 24% of the time  Memory Memory: 5-Recognizes or recalls 90% of the time/requires cueing < 10% of the time  Medical Problem List and Plan: 1. Functional deficits secondary to Left basal ganglia infarct.   2.  DVT Prophylaxis/Anticoagulation: Pharmaceutical: Lovenox 3. Pain Management: continue to use knee braces. Ice and Tylenol prn for bilateral knee pain. -will ask [facility] to assess for new bilateral knee braces to control varus better and to decrease pain, voltaren gel 4. Mood: No signs of distress noted. LCSW to follow for evaluation and support.   5. Neuropsych: This patient is is  capable of making decisions on his own  behalf. 6. Skin/Wound Care: Encourage pressure relief measures. Mobility. Maintain adequate nutritional and hydration status.   7. Fluids/Electrolytes/Nutrition: Monitor I/O. Check lytes in am.   8. Elevated LFTs: Improving--likely reactive. Hepatitis panel negative.  Will recheck in am. Abdominal Ultrasound negative.   9. HTN: Will monitor tid. Continue Norvasc and metoprolol. Educate patient on importance on medication adherence.   LOS (Days) 1 A FACE TO FACE EVALUATION WAS PERFORMED  KIRSTEINS,ANDREW E 09/26/2014, 6:53 AM

## 2014-09-26 NOTE — Evaluation (Signed)
Occupational Therapy Assessment and Plan  Patient Details  Name: [name] MRN: [id number] Date of Birth: 1951/03/25  OT Diagnosis: abnormal posture, acute pain, flaccid hemiplegia and hemiparesis, hemiplegia affecting dominant side and muscle weakness (generalized) Rehab Potential: Rehab Potential (ACUTE ONLY): Excellent ELOS: 7-10 days   Today's Date: 09/26/2014 OT Individual Time: 1000-1105 OT Individual Time Calculation (min): 65 min     Problem List:  Patient Active Problem List   Diagnosis Date Noted  . Right hemiparesis 09/25/2014  . Other secondary osteoarthritis of both knees 09/25/2014  . Basal ganglia infarction 09/25/2014  . Ischemic stroke 09/22/2014  . Dyslipidemia 09/22/2014  . Thrombocytopenia 09/22/2014  . Essential hypertension   . Stroke 09/21/2014  . Uncontrolled hypertension 09/21/2014    Past Medical History:  Past Medical History  Diagnosis Date  . Knee injury     right knee injury   . Hypertension   . Glaucoma   . Cataracts, bilateral    Past Surgical History:  Past Surgical History  Procedure Laterality Date  . No past surgeries      Assessment & Plan Clinical Impression: Patient is a 64 y.o. year old male with recent admission to the hospital on 09/21/14 with new onset of right sided weakness with difficulty walking for about a week PTA. UDS positive for THC. 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  Patient transferred to CIR on 09/25/2014 .    Patient currently requires mod with basic self-care skills secondary to muscle weakness and muscle joint tightness, impaired timing and sequencing, unbalanced muscle activation and decreased coordination, and decreased sitting balance, decreased standing balance, decreased postural control, hemiplegia and decreased  balance strategies.  Prior to hospitalization, patient could complete ADLs and IADLs with supervision.  Patient will benefit from skilled intervention to decrease level of assist with basic self-care skills and increase independence with basic self-care skills prior to discharge home with care partner.  Anticipate patient will require 24 hour supervision and follow up home health.  OT - End of Session Activity Tolerance: Tolerates 30+ min activity with multiple rests Endurance Deficit: Yes OT Assessment Rehab Potential (ACUTE ONLY): Excellent OT Patient demonstrates impairments in the following area(s): Balance;Endurance;Motor;Pain;Safety;Vision OT Basic ADL's Functional Problem(s): Grooming;Bathing;Dressing;Toileting OT Advanced ADL's Functional Problem(s): Simple Meal Preparation OT Transfers Functional Problem(s): Toilet;Tub/Shower OT Additional Impairment(s): Fuctional Use of Upper Extremity OT Plan OT Intensity: Minimum of 1-2 x/day, 45 to 90 minutes OT Frequency: 5 out of 7 days OT Duration/Estimated Length of Stay: 7-10 days OT Treatment/Interventions: Teacher, English as a foreign language;Neuromuscular re-education;Self Care/advanced ADL retraining;UE/LE Strength taining/ROM;UE/LE Coordination activities;Patient/family education;Discharge planning;Therapeutic Activities;Functional mobility training;Therapeutic Exercise OT Self Feeding Anticipated Outcome(s): independent OT Basic Self-Care Anticipated Outcome(s): supervision OT Toileting Anticipated Outcome(s): supervision OT Bathroom Transfers Anticipated Outcome(s): supervision OT Recommendation Patient destination: Home Follow Up Recommendations: Home health OT Equipment Recommended: 3 in 1 bedside comode;Tub/shower bench   Skilled Therapeutic Intervention Pt began working on selfcare re-training sit to stand in the shower.  Mod assist for transfer to the shower by ambulating without assistive device.   He demonstrated increased lean and LOB to the right while attempting to reach for any object he could to stabilize on while ambulating.  He needed mod assist for sit to stand during dressing and for washing peri area.  Pt with one LOB to the right when attempting to remove  his socks while sitting on the shower chair.  Min assist to selfcorrect.  Pt with severe limitations in bilateral knees with use of two braces at this time.  OT Evaluation Precautions/Restrictions  Precautions Precautions: Fall Required Braces or Orthoses: Other Brace/Splint Other Brace/Splint: B knee braces Restrictions Weight Bearing Restrictions: No  Pain Pain Assessment Pain Assessment: 0-10 Pain Score: 7  Pain Type: Chronic pain Pain Location: Knee Pain Orientation: Left;Right Pain Onset: With Activity (stair negotiation) Pain Intervention(s): Rest Home Living/Prior Functioning Home Living Family/patient expects to be discharged to:: Private residence Living Arrangements: Other relatives Available Help at Discharge: Family, Available 24 hours/day Type of Home: Apartment Home Access: Stairs to enter [employer] of Steps: Outdoor flight of stairs to second floor apartment Entrance Stairs-Rails: Left Home Layout: One level  Lives With: Other (Comment) IADL History Homemaking Responsibilities: Yes Cleaning Responsibility: Secondary Prior Function Level of Independence: Requires assistive device for independence, Independent with transfers, Independent with gait, Needs assistance with ADLs, Needs assistance with homemaking  Able to Take Stairs?: Yes Driving: No Vocation: On disability Leisure: Hobbies-yes (Comment) Comments: pt ggoes to gym 3-4x/week to walk and use weights, likes to listen to music  ADL  See FIM scale for details  Vision/Perception  Vision- History Baseline Vision/History: Wears glasses Wears Glasses: At all times Patient Visual Report: No change from baseline Vision-  Assessment Vision Assessment?: Yes Eye Alignment: Within Functional Limits Ocular Range of Motion: Within Functional Limits Tracking/Visual Pursuits: Other (comment) (lost fixation in the left medial and superior quadrants) Visual Fields: No apparent deficits  Cognition Overall Cognitive Status: Impaired/Different from baseline Arousal/Alertness: Awake/alert Orientation Level: Oriented X4 Attention: Selective Selective Attention: Appears intact Memory: Impaired Memory Impairment: Decreased recall of new information Awareness: Appears intact Problem Solving: Appears intact Safety/Judgment: Appears intact Sensation Sensation Light Touch: Appears Intact Stereognosis: Appears Intact Hot/Cold: Appears Intact Proprioception: Appears Intact Coordination Gross Motor Movements are Fluid and Coordinated: No Fine Motor Movements are Fluid and Coordinated: No Coordination and Movement Description: Chronic bilateral (L > R) varus knee deformities limiting mobility Finger Nose Finger Test: Pt with slight dysmetria in the RUE with finger to nose. Motor  Motor Motor: Abnormal postural alignment and control;Hemiplegia Motor - Skilled Clinical Observations: Mild R hemiparesis, long-standing history of bilateral knee pain and varus deformities L > R Mobility  Bed Mobility Bed Mobility: Supine to Sit;Sit to Supine Supine to Sit: HOB flat;5: Supervision Supine to Sit Details: Verbal cues for technique Sit to Supine: HOB flat;5: Supervision Transfers Transfers: Sit to Stand;Stand to Sit Sit to Stand: 4: Min assist;With upper extremity assist;With armrests;From chair/3-in-1 Sit to Stand Details: Manual facilitation for weight shifting;Verbal cues for technique;Verbal cues for precautions/safety Stand to Sit: 4: Min assist;With upper extremity assist;With armrests;To bed;To chair/3-in-1 Stand to Sit Details (indicate cue type and reason): Verbal cues for precautions/safety;Verbal cues for  technique  Trunk/Postural Assessment  Cervical Assessment Cervical Assessment: Exceptions to [facility] (slight forward protraction but not severe) Thoracic Assessment Thoracic Assessment: Within Functional Limits Lumbar Assessment Lumbar Assessment: Within Functional Limits Postural Control Postural Control: Deficits on evaluation Postural Limitations: Pt with increased trunk flexion in standing as well as increased posterior tilt noted dynamically when attempting to lift the LEs up in sitting during dressing tasks.  Balance Balance Balance Assessed: Yes Standardized Balance Assessment Standardized Balance Assessment: Berg Balance Test Berg Balance Test Sit to Stand: Able to stand  independently using hands Standing Unsupported: Able to stand 2 minutes with supervision Sitting with Back Unsupported but Feet  Supported on Floor or Stool: Able to sit safely and securely 2 minutes Stand to Sit: Sits safely with minimal use of hands Transfers: Needs one person to assist Standing Unsupported with Eyes Closed: Able to stand 10 seconds with supervision Dynamic Sitting Balance Dynamic Sitting - Balance Support: No upper extremity supported;During functional activity Dynamic Sitting - Level of Assistance: 4: Min assist Static Standing Balance Static Standing - Balance Support: Right upper extremity supported;Left upper extremity supported Static Standing - Level of Assistance: 4: Min assist Dynamic Standing Balance Dynamic Standing - Balance Support: Right upper extremity supported;Left upper extremity supported Dynamic Standing - Level of Assistance: 4: Min assist Extremity/Trunk Assessment RUE Assessment RUE Assessment: Exceptions to [facility] RUE Strength RUE Overall Strength Comments: Pt with overall AROM WFLS and strength 4/5 in the shoulder and elbow, but only 3+/5 in the hand.  Decreased FM coordination noted with attempted opposition testing.  He was able to oppose his thumb to all digits with  decreased accuracy however.  LUE Assessment LUE Assessment: Within Functional Limits  FIM:  FIM - Grooming Grooming Steps: Wash, rinse, dry face;Wash, rinse, dry hands Grooming: 4: Steadying assist  or patient completes 3 of 4 or 4 of 5 steps FIM - Bathing Bathing Steps Patient Completed: Chest;Right Arm;Left Arm;Abdomen;Right upper leg;Left upper leg;Right lower leg (including foot);Left lower leg (including foot) Bathing: 4: Steadying assist FIM - Upper Body Dressing/Undressing Upper body dressing/undressing steps patient completed: Thread/unthread right sleeve of pullover shirt/dresss;Thread/unthread left sleeve of pullover shirt/dress;Put head through opening of pull over shirt/dress;Pull shirt over trunk Upper body dressing/undressing: 5: Supervision: Safety issues/verbal cues FIM - Lower Body Dressing/Undressing Lower body dressing/undressing steps patient completed: Thread/unthread left pants leg;Thread/unthread right pants leg;Thread/unthread right underwear leg;Thread/unthread left underwear leg;Don/Doff left shoe;Fasten/unfasten right shoe;Don/Doff right sock;Don/Doff left sock Lower body dressing/undressing: 3: Mod-Patient completed 50-74% of tasks FIM - Control and instrumentation engineer Devices: Walker;Arm rests Bed/Chair Transfer: 5: Supine > Sit: Supervision (verbal cues/safety issues);5: Sit > Supine: Supervision (verbal cues/safety issues);4: Bed > Chair or W/C: Min A (steadying Pt. > 75%);4: Chair or W/C > Bed: Min A (steadying Pt. > 75%) FIM - Systems developer Devices: Shower chair;Grab bars Tub/shower Transfers: 3-Into Tub/Shower: Mod A (lift or lower/lift 2 legs);3-Out of Tub/Shower: Mod A (lift or lower/lift 2 legs)   Refer to Care Plan for Long Term Goals  Recommendations for other services: None  Discharge Criteria: Patient will be discharged from OT if patient refuses treatment 3 consecutive times without medical reason, if  treatment goals not met, if there is a change in medical status, if patient makes no progress towards goals or if patient is discharged from hospital.  The above assessment, treatment plan, treatment alternatives and goals were discussed and mutually agreed upon: by patient  Lowell Mcgurk OTR/L 09/26/2014, 4:33 PM

## 2014-09-26 NOTE — Evaluation (Signed)
Physical Therapy Assessment and Plan  Patient Details  Name: [name] MRN: [id number] Date of Birth: [birth year]  PT Diagnosis: Abnormal posture, Abnormality of gait, Difficulty walking, Hemiplegia dominant, Impaired cognition, Muscle weakness and Pain in bilateral knees, Decreased balance Rehab Potential: Good ELOS: 7-10 days   Today's Date: 09/26/2014 PT Individual Time: 1300-1405 PT Individual Time Calculation (min): 65 min    Problem List:  Patient Active Problem List   Diagnosis Date Noted  . Right hemiparesis 09/25/2014  . Other secondary osteoarthritis of both knees 09/25/2014  . Basal ganglia infarction 09/25/2014  . Ischemic stroke 09/22/2014  . Dyslipidemia 09/22/2014  . Thrombocytopenia 09/22/2014  . Essential hypertension   . Stroke 09/21/2014  . Uncontrolled hypertension 09/21/2014    Past Medical History:  Past Medical History  Diagnosis Date  . Knee injury     right knee injury   . Hypertension   . Glaucoma   . Cataracts, bilateral    Past Surgical History:  Past Surgical History  Procedure Laterality Date  . No past surgeries      Assessment & Plan Clinical Impression: [name] is a 64 y.o. 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 UDS positive for THC. 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 Carotid dopplers without significant Work up ongoing and neurology recommends ASA for secondary stroke prevention. 2D echo done revealing EF 60-65% with moderate LVH and grade 1 diastolic dysfunction. Dr. Leonie Man felt that patient with left subcortical infarct secondary to small vessel disease and recommended  aggressive risk factor modification. Patient with resultant RUE weakness with word finding deficits and CIR recommended by MD and rehab team for follow up therapy. Patient transferred to CIR on 09/25/2014.   Patient currently requires min-mod with mobility secondary to muscle weakness, decreased cardiorespiratory endurance, impaired timing and sequencing, unbalanced muscle activation, decreased coordination, decreased balance and balance strategies. Prior to hospitalization, patient was modified independent -supervision with mobility and lived with Other (Comment) in a [place] home.  Home access is Outdoor flight of stairs to second floor apartmentStairs to enter.  Patient will benefit from skilled PT intervention to maximize safe functional mobility, minimize fall risk and decrease caregiver burden for planned discharge home with 24 hour supervision.  Anticipate patient will benefit from follow up [place] at discharge.  PT - End of Session Activity Tolerance: Tolerates 30+ min activity with multiple rests Endurance Deficit: Yes Endurance Deficit Description: required multiple seated rest breaks with mobility PT Assessment Rehab Potential (ACUTE/IP ONLY): Good Barriers to Discharge: Inaccessible home environment Barriers to Discharge Comments: Flight of stairs to second floor apartment PT Patient demonstrates impairments in the following area(s): Balance;Endurance;Motor;Pain;Safety PT Transfers Functional Problem(s): Bed Mobility;Bed to Chair;Car;Furniture PT Locomotion Functional Problem(s): Ambulation;Wheelchair Mobility;Stairs PT Plan PT Intensity: Minimum of 1-2 x/day ,45 to 90 minutes PT Frequency: 5 out of 7 days PT Duration Estimated Length of Stay: 7-10 days PT Treatment/Interventions: Ambulation/gait training;Balance/vestibular training;Cognitive remediation/compensation;Community reintegration;Discharge planning;Disease management/prevention;DME/adaptive equipment instruction;Functional  mobility training;Neuromuscular re-education;Pain management;Patient/family education;Splinting/orthotics;Stair training;Therapeutic Activities;Therapeutic Exercise;UE/LE Strength taining/ROM;UE/LE Coordination activities;Wheelchair propulsion/positioning PT Transfers Anticipated Outcome(s): mod I PT Locomotion Anticipated Outcome(s): mod I household ambulator, S community ambulator and stairs PT Recommendation Follow Up Recommendations: 24 hour supervision/assistance;Outpatient PT Patient  destination: Home Equipment Recommended: To be determined Equipment Details: pt has SPC and quad cane   Skilled Therapeutic Intervention Skilled therapeutic intervention initiated after completion of evaluation. Discussed with patient falls risk, safety within room, and focus of therapy during stay. Discussed possible LOS, goals, and f/u therapy. Prosthetist Gerald Stabs) from [facility] present to assess patient for bilateral varus knee control braces. Patient fatigued at end of session and requested to return to bed. Pt left supine in bed with all needs within reach.    PT Evaluation Precautions/Restrictions Precautions Precautions: Fall Required Braces or Orthoses: Other Brace/Splint Other Brace/Splint: B knee braces Restrictions Weight Bearing Restrictions: No General Chart Reviewed: Yes Family/Caregiver Present: No  Pain Pain Assessment Pain Assessment: 0-10 Pain Score: 7  Pain Type: Chronic pain Pain Location: Knee Pain Orientation: Left;Right Pain Onset: With Activity (stair negotiation) Pain Intervention(s): Rest Home Living/Prior Functioning Home Living Living Arrangements: Other relatives Available Help at Discharge: Family;Available 24 hours/day Type of Home: Apartment Home Access: Stairs to enter [employer] of Steps: Outdoor flight of stairs to second floor apartment Entrance Stairs-Rails: Left Home Layout: One level  Lives With: Other (Comment) Prior Function Level of  Independence: Requires assistive device for independence;Independent with transfers;Independent with gait;Needs assistance with ADLs;Needs assistance with homemaking  Able to Take Stairs?: Yes Driving: No Vocation: On disability Leisure: Hobbies-yes (Comment) Comments: pt ggoes to gym 3-4x/week to walk and use weights, likes to listen to music  Vision/Perception  Vision - Assessment Eye Alignment: Within Functional Limits Ocular Range of Motion: Within Functional Limits Tracking/Visual Pursuits: Other (comment) (lost fixation in the left medial and superior quadrants)  Cognition Overall Cognitive Status: Impaired/Different from baseline Arousal/Alertness: Awake/alert Orientation Level: Oriented X4 Attention: Selective Selective Attention: Appears intact Memory: Impaired Memory Impairment: Decreased recall of new information Awareness: Appears intact Problem Solving: Appears intact Safety/Judgment: Appears intact Sensation Sensation Light Touch: Appears Intact Stereognosis: Appears Intact Hot/Cold: Appears Intact Proprioception: Appears Intact Coordination Gross Motor Movements are Fluid and Coordinated: No Fine Motor Movements are Fluid and Coordinated: No Coordination and Movement Description: Chronic bilateral (L > R) varus knee deformities limiting mobility Finger Nose Finger Test: Pt with slight dysmetria in the RUE with finger to nose. Motor  Motor Motor: Abnormal postural alignment and control;Hemiplegia Motor - Skilled Clinical Observations: Mild R hemiparesis, long-standing history of bilateral knee pain and varus deformities L > R  Mobility Bed Mobility Bed Mobility: Supine to Sit;Sit to Supine Supine to Sit: HOB flat;5: Supervision Supine to Sit Details: Verbal cues for technique Sit to Supine: HOB flat;5: Supervision Transfers Transfers: Yes Sit to Stand: 4: Min assist;With upper extremity assist;With armrests;From chair/3-in-1 Sit to Stand Details: Manual  facilitation for weight shifting;Verbal cues for technique;Verbal cues for precautions/safety Stand to Sit: 4: Min assist;With upper extremity assist;With armrests;To bed;To chair/3-in-1 Stand to Sit Details (indicate cue type and reason): Verbal cues for precautions/safety;Verbal cues for technique Locomotion  Ambulation Ambulation: Yes Ambulation/Gait Assistance: 4: Min assist Ambulation Distance (Feet): 80 Feet Assistive device: Rolling walker Gait Gait: Yes Gait Pattern: Impaired Gait Pattern: Step-through pattern;Decreased stride length;Shuffle;Poor foot clearance - right;Trunk flexed;Narrow base of support (B knee valgus ) Gait velocity: 10 MWT = 0.23 m/s Stairs / Additional Locomotion Stairs: Yes Stairs Assistance: 4: Min assist Stairs Assistance Details: Verbal cues for sequencing;Verbal cues for precautions/safety Stair Management Technique: Two rails;Step to pattern;Forwards Number of Stairs: 5 Height of Stairs: 6 Ramp: Not tested (comment) Curb: Not tested (comment) Wheelchair Mobility Wheelchair Mobility: Yes Wheelchair Assistance: 4: Min assist  Wheelchair Assistance Details: Verbal cues for technique;Manual facilitation for placement;Tactile cues for sequencing;Verbal cues for precautions/safety Wheelchair Propulsion: Both upper extremities Wheelchair Parts Management: Needs assistance Distance: 30  Trunk/Postural Assessment  Cervical Assessment Cervical Assessment: Exceptions to [facility] (mild forward head) Thoracic Assessment Thoracic Assessment: Exceptions to [facility] (kyphosis) Lumbar Assessment Lumbar Assessment: Exceptions to [facility] (posterior pelvic tilt) Postural Control Postural Control: Deficits on evaluation Protective Responses: impaired Postural Limitations: Severe bilateral (L > R) knee varus deformities  Balance Balance Balance Assessed: Yes Standardized Balance Assessment Standardized Balance Assessment: Berg Balance Test Berg Balance Test Sit to Stand:  Able to stand  independently using hands Standing Unsupported: Able to stand 2 minutes with supervision Sitting with Back Unsupported but Feet Supported on Floor or Stool: Able to sit safely and securely 2 minutes Stand to Sit: Sits safely with minimal use of hands Transfers: Needs one person to assist Standing Unsupported with Eyes Closed: Able to stand 10 seconds with supervision Dynamic Sitting Balance Dynamic Sitting - Balance Support: No upper extremity supported;During functional activity Dynamic Sitting - Level of Assistance: 4: Min assist Static Standing Balance Static Standing - Balance Support: During functional activity;No upper extremity supported Static Standing - Level of Assistance: 5: Stand by assistance Dynamic Standing Balance Dynamic Standing - Balance Support: During functional activity;No upper extremity supported Dynamic Standing - Level of Assistance: 4: Min assist Extremity Assessment  RUE Assessment RUE Assessment: Exceptions to [facility] RUE Strength RUE Overall Strength Comments: Pt with overall AROM WFLS and strength 4/5 in the shoulder and elbow, but only 3+/5 in the hand.  Decreased FM coordination noted with attempted opposition testing.  He was able to oppose his thumb to all digits with decreased accuracy however.  LUE Assessment LUE Assessment: Within Functional Limits RLE Assessment RLE Assessment: Exceptions to [facility] RLE AROM (degrees) Overall AROM Right Lower Extremity: Deficits;Due to premorbid status RLE Overall AROM Comments: lacking full knee extension due to varus deformity RLE Strength RLE Overall Strength: Deficits RLE Overall Strength Comments: 4+/5 throughout except hip flexion 4-/5, painful with knee flex/extension  LLE Assessment LLE Assessment: Within Functional Limits (4+/5 throughout, painful with knee flexion/extension, varus deformity not limiting AROM)  FIM:  FIM - Control and instrumentation engineer Devices: Walker;Arm  rests Bed/Chair Transfer: 5: Supine > Sit: Supervision (verbal cues/safety issues);5: Sit > Supine: Supervision (verbal cues/safety issues);4: Bed > Chair or W/C: Min A (steadying Pt. > 75%);4: Chair or W/C > Bed: Min A (steadying Pt. > 75%) FIM - Locomotion: Wheelchair Distance: 30 Locomotion: Wheelchair: 1: Travels less than 50 ft with moderate assistance (Pt: 50 - 74%) FIM - Locomotion: Ambulation Locomotion: Ambulation Assistive Devices: Administrator Ambulation/Gait Assistance: 4: Min assist Locomotion: Ambulation: 2: Travels 50 - 149 ft with minimal assistance (Pt.>75%) FIM - Locomotion: Stairs Locomotion: Scientist, physiological: Hand rail - 2 Locomotion: Stairs: 2: Up and Down 4 - 11 stairs with minimal assistance (Pt.>75%)   Refer to Care Plan for Long Term Goals  Recommendations for other services: None  Discharge Criteria: Patient will be discharged from PT if patient refuses treatment 3 consecutive times without medical reason, if treatment goals not met, if there is a change in medical status, if patient makes no progress towards goals or if patient is discharged from hospital.  The above assessment, treatment plan, treatment alternatives and goals were discussed and mutually agreed upon: by patient  [name] 09/26/2014, 4:42 PM

## 2014-09-26 NOTE — Evaluation (Signed)
Speech Language Pathology Assessment and Plan  Patient Details  Name: [name] MRN: [id number] Date of Birth: May 31, 1951  SLP Diagnosis: Aphasia;Cognitive Impairments  Rehab Potential: Good ELOS: 7-10 days   Today's Date: 09/26/2014 SLP Individual Time: 1224-8250 SLP Individual Time Calculation (min): 60 min   Problem List:  Patient Active Problem List   Diagnosis Date Noted  . Right hemiparesis 09/25/2014  . Other secondary osteoarthritis of both knees 09/25/2014  . Basal ganglia infarction 09/25/2014  . Ischemic stroke 09/22/2014  . Dyslipidemia 09/22/2014  . Thrombocytopenia 09/22/2014  . Essential hypertension   . Stroke 09/21/2014  . Uncontrolled hypertension 09/21/2014   Past Medical History:  Past Medical History  Diagnosis Date  . Knee injury     right knee injury   . Hypertension   . Glaucoma   . Cataracts, bilateral    Past Surgical History:  Past Surgical History  Procedure Laterality Date  . 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  Upon admission UDS positive for THC, MRI of brain with subacute ischemic infarct involving the superior left basal ganglia with cephalad extension into the periventricular white matter of the left corona radiata and atrophy with moderate chronic ischemic changes. Dr. 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 Patient with resultant RUE weakness with word finding deficits and CIR recommended by MD and rehab team for follow up therapy; patient admitted 09/25/14.  Orders received, cognitive-linguistic evaluation completed.  Patient presents  with intact speech, receptive language skills and basic cognitive abilities.  Patient demonstrates mild impairments in cognition characterized by impaired new complex recall and problem solving as well as expressive language skills characterized by anomia.  Patient would benefit from skilled SLP services during his rehab admission in order to maximize his functional independence prior to discharge home with 24/7 supervision.    Skilled Therapeutic Interventions          Cognitive-linguistic evaluation completed with results and recommendations reviewed with patient.  SLP also initiated skilled treatment with education regarding word finding strategies as well as Max multimodal cues to utilize in a structured generative naming task.       SLP Assessment  Patient will need skilled [place] [facility] during CIR admission    Recommendations  Oral Care Recommendations: Oral care BID Patient destination: Home Follow up Recommendations: 24 hour supervision/assistance Equipment Recommended: None recommended by SLP    SLP Frequency 3 to 5 out of 7 days   SLP Treatment/Interventions Cognitive remediation/compensation;Cueing hierarchy;Environmental controls;Functional tasks;Internal/external aids;Medication managment;Patient/family education;Speech/Language facilitation    Pain Pain Assessment Pain Assessment: No/denies pain Prior Functioning Cognitive/Linguistic Baseline: Baseline deficits Baseline deficit details: Per caregiver report, pt has a baseline intellectual disability.   Lives With: Other (Comment) (sister) Available Help at Discharge: Family;Available 24 hours/day Vocation: On disability  Short Term Goals: Week 1: SLP Short Term Goal 1 (Week 1): Patient will demonstrate use of word finding strategies during structured tasks with Min verbal cues SLP Short Term Goal 2 (Week 1): Patient will utilize external aids to assist with recall of new information with Min question  cues SLP Short Term Goal 3 (Week 1): Patient  will demonstrate complex problem solving with Min question cues to self-monitor and correct errors  See FIM for current functional status Refer to Care Plan for Long Term Goals  Recommendations for other services: None  Discharge Criteria: Patient will be discharged from SLP if patient refuses treatment 3 consecutive times without medical reason, if treatment goals not met, if there is a change in medical status, if patient makes no progress towards goals or if patient is discharged from hospital.  The above assessment, treatment plan, treatment alternatives and goals were discussed and mutually agreed upon: by patient  Gunnar Fusi, M.A., CCC-SLP [phone]  [place] 09/26/2014, 12:44 PM

## 2014-09-26 NOTE — IPOC Note (Addendum)
Overall Plan of Care [facility]) Patient Details Name: [name] MRN: [id number] DOB: [birth year]  Admitting Diagnosis: cva  Hospital Problems: Active Problems:   Ischemic stroke   Right hemiparesis   Other secondary osteoarthritis of both knees   Basal ganglia infarction     Functional Problem List: Nursing Edema, Endurance, Medication Management, Pain, Safety, Bladder, Bowel  PT Balance, Endurance, Motor, Pain, Safety  OT Balance, Endurance, Motor, Pain, Safety, Vision  SLP Cognition, Linguistic  TR Activity tolerance, functional mobility, balance, cognition, safety, pain, anxiety/stress        Basic ADL's: OT Grooming, Bathing, Dressing, Toileting     Advanced  ADL's: OT Simple Meal Preparation     Transfers: PT Bed Mobility, Bed to Chair, Car, Manufacturing systems engineer, Metallurgist: PT Ambulation, Emergency planning/management officer, Stairs     Additional Impairments: OT Fuctional Use of Upper Extremity  SLP Communication, Social Cognition expression Memory  TR      Anticipated Outcomes Item Anticipated Outcome  Self Feeding independent  Swallowing      Basic self-care  supervision  Toileting  supervision   Bathroom Transfers supervision  Bowel/Bladder  modified independence with bowel and bladder, continent, has urgency  Transfers  mod I  Locomotion  mod I household ambulator, S community ambulator and stairs  Personal assistant  Supervision with complex tasks  Pain  3 or less on scale of 1-10  Safety/Judgment  supervision   Therapy Plan: PT Intensity: Minimum of 1-2 x/day ,45 to 90 minutes PT Frequency: 5 out of 7 days PT Duration Estimated Length of Stay: 7-10 days OT Intensity: Minimum of 1-2 x/day, 45 to 90 minutes OT Frequency: 5 out of 7 days OT Duration/Estimated Length of Stay: 7-10 days SLP Intensity: Minumum of 1-2 x/day, 30 to 90 minutes SLP Frequency: 3 to 5 out of 7 days  SLP Duration/ELOS: 7-10 Days TR  Duration/ELOS: 7 Days TR Frequency:  Min 1 time per week >20 minutes        Team Interventions: Nursing Interventions Patient/Family Education, Bladder Management, Bowel Management, Disease Management/Prevention, Pain Management, Medication Management, Discharge Planning, Psychosocial Support  PT interventions Ambulation/gait training, Training and development officer, Cognitive remediation/compensation, Community reintegration, Discharge planning, Disease management/prevention, DME/adaptive equipment instruction, Functional mobility training, Neuromuscular re-education, Pain management, Patient/family education, Splinting/orthotics, Stair training, Therapeutic Activities, Therapeutic Exercise, UE/LE Strength taining/ROM, UE/LE Coordination activities, Wheelchair propulsion/positioning  OT Interventions Training and development officer, DME/adaptive equipment instruction, Neuromuscular re-education, Self Care/advanced ADL retraining, UE/LE Strength taining/ROM, UE/LE Coordination activities, Patient/family education, Discharge planning, Therapeutic Activities, Functional mobility training, Therapeutic Exercise  SLP Interventions Cognitive remediation/compensation, Cueing hierarchy, Environmental controls, Functional tasks, Internal/external aids, Medication managment, Patient/family education, Speech/Language facilitation  TR Interventions Recreation/leisure participation, Balance/Vestibular training, functional mobility, therapeutic activities, UE/LE strength/coordination, w/c mobility, community reintegration, pt/family education, cognitive retaining/compensation, adaptive equipment instruction/use, discharge planning, psychosocial support  SW/CM Interventions Discharge Planning, Psychosocial Support, Patient/Family Education    Team Discharge Planning: Destination: PT-Home ,OT- Home , SLP-Home Projected Follow-up: PT-24 hour supervision/assistance, Outpatient PT, OT-  Home health OT, SLP-24 hour  supervision/assistance Projected Equipment Needs: PT-To be determined, OT- 3 in 1 bedside comode, Tub/shower bench, SLP-None recommended by SLP Equipment Details: PT-pt has SPC and quad cane , OT-  Patient/family involved in discharge planning: PT- Patient,  OT-Patient, SLP-Patient  MD ELOS: 7d Medical Rehab Prognosis:  Excellent Assessment: 64 y.o. 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 UDS positive for THC. MRI of brain with subacute ischemic infarct involving the superior left basal ganglia with cephalad extension into the periventricular white matter of the left corona radiata and atrophy with moderate chronic ischemic changes   Now requiring 24/7 Rehab RN,MD, as well as CIR level PT, OT and SLP.  Treatment team will focus on ADLs and mobility with goals set at Supervision   See Team Conference Notes for weekly updates to the plan of care

## 2014-09-27 ENCOUNTER — Inpatient Hospital Stay (HOSPITAL_COMMUNITY): Payer: Medicare HMO | Admitting: Occupational Therapy

## 2014-09-27 ENCOUNTER — Inpatient Hospital Stay (HOSPITAL_COMMUNITY): Payer: Medicare HMO | Admitting: Physical Therapy

## 2014-09-27 ENCOUNTER — Inpatient Hospital Stay (HOSPITAL_COMMUNITY): Payer: Medicare HMO | Admitting: Speech Pathology

## 2014-09-27 DIAGNOSIS — M170 Bilateral primary osteoarthritis of knee: Secondary | ICD-10-CM

## 2014-09-27 NOTE — Progress Notes (Signed)
Physical Therapy Session Note  Patient Details  Name: [name] MRN: [id number] Date of Birth: 28-Jun-1951  Today's Date: 09/27/2014 PT Individual Time: 1400-1500 PT Individual Time Calculation (min): 60 min   Short Term Goals: Week 1:  PT Short Term Goal 1 (Week 1): = LTGs due to anticipated LOS  Skilled Therapeutic Interventions/Progress Updates:    Session focused on ambulation with and without RW, dynamic balance, and LE strengthening. Pt received with B rigid Donjoy knee offloading braces donned. Gait training room <> therapy gym 2 x 150 ft using RW with close supervision. Pt performed dynamic standing balance on foam pad while participating in 2 games of horseshoes, including reaching outside BOS for horseshoes and ambulating without device to retrieve horseshoes with overall min A. Pt performed sit <> stand x 10 from mat table without UE support. Pt negotiated up/down 10 stairs using 2 rails with close supervision and step-to pattern, verbal cues for advancing RUE along railing. Pt negotiated obstacle course including thresholds, obstacle negotiation, and turning using RW with verbal cues for safety as pt tended to keep RW far away from body during turns and over thresholds and overall supervision. NuStep using BLE only at level 1 x 3 min for BLE strengthening and activity tolerance. Patient returned to room and left sitting edge of bed with all needs within reach.   Therapy Documentation Precautions:  Precautions Precautions: Fall Required Braces or Orthoses: Other Brace/Splint Other Brace/Splint: B knee braces Restrictions Weight Bearing Restrictions: No Pain: Pain Assessment Pain Assessment: No/denies pain Pain Score: 7  Pain Type: Chronic pain Pain Location: Knee Pain Orientation: Left;Right Pain Descriptors / Indicators: Aching Pain Onset: With Activity (stair negotiation) Pain Intervention(s): Rest Locomotion : Ambulation Ambulation/Gait Assistance: 5:  Supervision;4: Min guard   See FIM for current functional status  Therapy/Group: Individual Therapy  [name] 09/27/2014, 4:01 PM

## 2014-09-27 NOTE — Progress Notes (Signed)
64 y.o. 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 UDS positive for THC. 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 Carotid dopplers without significant Work up ongoing and neurology recommends ASA for secondary stroke prevention. 2D echo done revealing EF 60-65% with moderate LVH and grade 1 diastolic dysfunction. Dr. 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 rate 18, height '5\' 10"'  (1.778 m), weight 73.029 kg (161 lb), SpO2 100 %. Dg Knee Bilateral Standing Ap  09/26/2014   CLINICAL DATA:  Bilateral knee pain.  EXAM: BILATERAL KNEES STANDING - 1 VIEW  COMPARISON:  None.  FINDINGS: There is severe bilateral medial compartment narrowing, subchondral sclerosis and marginal spur formation. Chondrocalcinosis is noted within the lateral compartment of the left knee.  IMPRESSION: 1. Severe bilateral and symmetric medial compartment osteoarthritis.   Electronically Signed   By: Kerby Moors M.D.   On: 09/26/2014 13:55   Results for orders placed or performed during the hospital encounter of 09/25/14 (from the past 72 hour(s))  CBC WITH DIFFERENTIAL     Status: Abnormal   Collection Time: 09/26/14  5:55 AM  Result Value Ref Range   WBC 5.5 4.0 - 10.5 K/uL   RBC 4.62 4.22 - 5.81 MIL/uL   Hemoglobin 14.5 13.0  - 17.0 g/dL   HCT 43.7 39.0 - 52.0 %   MCV 94.6 78.0 - 100.0 fL   MCH 31.4 26.0 - 34.0 pg   MCHC 33.2 30.0 - 36.0 g/dL   RDW 13.4 11.5 - 15.5 %   Platelets 153 150 - 400 K/uL   Neutrophils Relative % 58 43 - 77 %   Neutro Abs 3.2 1.7 - 7.7 K/uL   Lymphocytes Relative 26 12 - 46 %   Lymphs Abs 1.4 0.7 - 4.0 K/uL   Monocytes Relative 9 3 - 12 %   Monocytes Absolute 0.5 0.1 - 1.0 K/uL   Eosinophils Relative 6 (H) 0 - 5 %   Eosinophils Absolute 0.3 0.0 - 0.7 K/uL   Basophils Relative 1 0 - 1 %   Basophils Absolute 0.0 0.0 - 0.1 K/uL  Comprehensive metabolic panel     Status: Abnormal   Collection Time: 09/26/14  5:55 AM  Result Value Ref Range   Sodium 138 135 - 145 mmol/L   Potassium 3.4 (L) 3.5 - 5.1 mmol/L   Chloride 104 96 - 112 mmol/L   CO2 27 19 - 32 mmol/L   Glucose, Bld 98 70 - 99 mg/dL   BUN 9 6 - 23 mg/dL   Creatinine, Ser 0.85 0.50 - 1.35 mg/dL   Calcium 8.7 8.4 - 10.5 mg/dL   Total Protein 6.3 6.0 - 8.3 g/dL   Albumin 2.9 (L) 3.5 - 5.2 g/dL   AST 50 (H) 0 - 37 U/L   ALT 47 0 - 53 U/L   Alkaline Phosphatase 61 39 - 117 U/L  Total Bilirubin 0.7 0.3 - 1.2 mg/dL   GFR calc non Af Amer 89 (L) >90 mL/min   GFR calc Af Amer >90 >90 mL/min    Comment: (NOTE) The eGFR has been calculated using the CKD EPI equation. This calculation has not been validated in all clinical situations. eGFR's persistently <90 mL/min signify possible Chronic Kidney Disease.    Anion gap 7 5 - 15     HEENT: normal Cardio: RRR Resp: CTA B/L GI: BS positive Extremity:  No Edema Skin:   Intact Neuro: Alert/Oriented, Flat, Normal Sensory, Abnormal Motor 4/5 Right delt, bi, tri, grip, HF, KN ankle DF and Abnormal FMC Ataxic/ dec FMC Musc/Skel:  Other varus deformity Left knee with Left knee effusion Gen NAD   Assessment/Plan: 1. Functional deficits secondary to Left basal ganglia infarct which require 3+ hours per day of interdisciplinary therapy in a comprehensive inpatient rehab  setting. Physiatrist is providing close team supervision and 24 hour management of active medical problems listed below. Physiatrist and rehab team continue to assess barriers to discharge/monitor patient progress toward functional and medical goals. FIM: FIM - Bathing Bathing Steps Patient Completed: Chest, Right Arm, Left Arm, Abdomen, Right upper leg, Left upper leg, Right lower leg (including foot), Left lower leg (including foot) Bathing: 4: Steadying assist  FIM - Upper Body Dressing/Undressing Upper body dressing/undressing steps patient completed: Thread/unthread right sleeve of pullover shirt/dresss, Thread/unthread left sleeve of pullover shirt/dress, Put head through opening of pull over shirt/dress, Pull shirt over trunk Upper body dressing/undressing: 5: Supervision: Safety issues/verbal cues FIM - Lower Body Dressing/Undressing Lower body dressing/undressing steps patient completed: Thread/unthread left pants leg, Thread/unthread right pants leg, Thread/unthread right underwear leg, Thread/unthread left underwear leg, Don/Doff left shoe, Fasten/unfasten right shoe, Don/Doff right sock, Don/Doff left sock Lower body dressing/undressing: 3: Mod-Patient completed 50-74% of tasks        FIM - Control and instrumentation engineer Devices: Walker, Arm rests Bed/Chair Transfer: 5: Supine > Sit: Supervision (verbal cues/safety issues), 5: Sit > Supine: Supervision (verbal cues/safety issues), 4: Bed > Chair or W/C: Min A (steadying Pt. > 75%), 4: Chair or W/C > Bed: Min A (steadying Pt. > 75%)  FIM - Locomotion: Wheelchair Distance: 30 Locomotion: Wheelchair: 1: Travels less than 50 ft with moderate assistance (Pt: 50 - 74%) FIM - Locomotion: Ambulation Locomotion: Ambulation Assistive Devices: Administrator Ambulation/Gait Assistance: 4: Min assist Locomotion: Ambulation: 2: Travels 50 - 149 ft with minimal assistance (Pt.>75%)  Comprehension Comprehension Mode:  Auditory Comprehension: 6-Follows complex conversation/direction: With extra time/assistive device  Expression Expression Mode: Verbal Expression: 5-Expresses basic needs/ideas: With no assist  Social Interaction Social Interaction: 5-Interacts appropriately 90% of the time - Needs monitoring or encouragement for participation or interaction.  Problem Solving Problem Solving: 5-Solves basic problems: With no assist  Memory Memory: 5-Recognizes or recalls 90% of the time/requires cueing < 10% of the time     Medical Problem List and Plan: 1. Functional deficits secondary to Left basal ganglia infarct.   2.  DVT Prophylaxis/Anticoagulation: Pharmaceutical: Lovenox 3. Pain Management: continue to use knee braces. Ice and Tylenol prn for bilateral knee pain. -will ask [facility] to assess for new bilateral knee braces to control varus better and to decrease pain, voltaren gel Pt with severe medial compartment OA- rec ortho surgery eval in ~54mo4. Mood: No signs of distress noted. LCSW to follow for evaluation and support.   5. Neuropsych: This patient is is  capable of making decisions  on his own behalf. 6. Skin/Wound Care: Encourage pressure relief measures. Mobility. Maintain adequate nutritional and hydration status.   7. Fluids/Electrolytes/Nutrition: Monitor I/O. Check lytes in am.   8. Elevated LFTs: Improving--likely reactive. Hepatitis panel negative.  Will recheck in am. Abdominal Ultrasound negative.   9. HTN: Will monitor tid. Continue Norvasc and metoprolol. Educate patient on importance on medication adherence.   LOS (Days) 2 A FACE TO FACE EVALUATION WAS PERFORMED  Monnie Gudgel E 09/27/2014, 6:41 AM

## 2014-09-27 NOTE — Progress Notes (Signed)
Speech Language Pathology Daily Session Note  Patient Details  Name: [name] MRN: [id number] Date of Birth: Jan 01, 1951  Today's Date: 09/27/2014 SLP Individual Time: 0830-0930 SLP Individual Time Calculation (min): 60 min  Short Term Goals: Week 1: SLP Short Term Goal 1 (Week 1): Patient will demonstrate use of word finding strategies during structured tasks with Min verbal cues SLP Short Term Goal 2 (Week 1): Patient will utilize external aids to assist with recall of new information with Min question cues SLP Short Term Goal 3 (Week 1): Patient will demonstrate complex problem solving with Min question cues to self-monitor and correct errors  Skilled Therapeutic Interventions: Skilled treatment session focused on addressing language and cognition goals. SLP facilitated session with implementation of an external aid for recall of current medications, functions and frequency.  Patient initially required step-by-step instructions cues to problem solve use of medication box; however, SLP was able to fade cues to Supervision level by end of session.  SLP also facilitated session with education on visualization and description as tools during moments of anomia and during a structured naming task patient was able to utilize with Mod I.  Continue with current plan of care.       FIM:  Comprehension Comprehension Mode: Auditory Comprehension: 6-Follows complex conversation/direction: With extra time/assistive device Expression Expression Mode: Verbal Expression: 5-Expresses basic needs/ideas: With extra time/assistive device Social Interaction Social Interaction: 5-Interacts appropriately 90% of the time - Needs monitoring or encouragement for participation or interaction. Problem Solving Problem Solving: 5-Solves basic problems: With no assist Memory Memory: 6-Assistive device: No helper  Pain Pain Assessment Pain Assessment: No/denies pain  Therapy/Group: Individual  Therapy  Carmelia Roller., [place] [phone]  [place] 09/27/2014, 12:32 PM

## 2014-09-27 NOTE — Progress Notes (Signed)
Occupational Therapy Session Note  Patient Details  Name: [name] MRN: [id number] Date of Birth: 05-09-1951  Today's Date: 09/27/2014 OT Individual Time: 1101-1201 OT Individual Time Calculation (min): 60 min    Short Term Goals: Week 1:  OT Short Term Goal 1 (Week 1): Pt will perform all LB bathing sit to stand with supervision shower level. OT Short Term Goal 2 (Week 1): Pt will perform toilet transfers and all aspects of toileting with supervision. OT Short Term Goal 3 (Week 1): Pt will perform shower transfers with LRAD and supervision for walk-in. OT Short Term Goal 4 (Week 1): Pt will independently perform RUE FM and gross motor coordination exercises following handout.  OT Short Term Goal 5 (Week 1): Pt will complete all dressing sit to stand at the sink with supervision.    Skilled Therapeutic Interventions/Progress Updates:    Pt performed toilet transfer from the EOB using the RW with min assist.  He needed mod assist for sit to stand from the lower toilet and then transfer to the walk-in shower.  He was able to complete all bathing sit to stand with min guard assist using the grab bar for support.  Min assist for transfer back out to the edge of the bed for dressing tasks.  He needed total assist for donning bilateral knee braces, with max assist for donning right sock and both shoes.  Pt with spontaneous use of the RUE during session but needs mod instructional cueing to incorporate more for sit to stand and for pulling pants and underpants over hips.  He was able to tie his shoes however.  Pt was given stress ball at end of session for gross grasp and manipulation using the right.   Therapy Documentation Precautions:  Precautions Precautions: Fall Required Braces or Orthoses: Other Brace/Splint Other Brace/Splint: B knee braces Restrictions Weight Bearing Restrictions: No  Pain: Pain Assessment Pain Assessment: No/denies pain Pain Score: 7  Pain Type: Chronic  pain Pain Location: Knee Pain Orientation: Left;Right Pain Descriptors / Indicators: Aching Pain Onset: With Activity (stair negotiation) Pain Intervention(s): Rest ADL See FIM for current functional status  Therapy/Group: Individual Therapy  [name] OTR/L 09/27/2014, 3:17 PM

## 2014-09-28 ENCOUNTER — Inpatient Hospital Stay (HOSPITAL_COMMUNITY): Payer: Medicare HMO

## 2014-09-28 ENCOUNTER — Inpatient Hospital Stay (HOSPITAL_COMMUNITY): Payer: Medicare HMO | Admitting: Speech Pathology

## 2014-09-28 ENCOUNTER — Inpatient Hospital Stay (HOSPITAL_COMMUNITY): Payer: Medicare HMO | Admitting: Occupational Therapy

## 2014-09-28 DIAGNOSIS — I10 Essential (primary) hypertension: Secondary | ICD-10-CM

## 2014-09-28 MED ORDER — LISINOPRIL 10 MG PO TABS
10.0000 mg | ORAL_TABLET | Freq: Every day | ORAL | Status: DC
  Administered 2014-09-28 – 2014-10-03 (×6): 10 mg via ORAL
  Filled 2014-09-28 (×7): qty 1

## 2014-09-28 MED ORDER — CYCLOSPORINE 0.05 % OP EMUL
1.0000 [drp] | Freq: Two times a day (BID) | OPHTHALMIC | Status: DC
Start: 2014-09-28 — End: 2014-10-03
  Administered 2014-09-28 – 2014-10-03 (×11): 1 [drp] via OPHTHALMIC
  Filled 2014-09-28 (×13): qty 1

## 2014-09-28 NOTE — Progress Notes (Signed)
Speech Language Pathology Daily Session Note  Patient Details  Name: [name] MRN: [id number] Date of Birth: 07-19-1951  Today's Date: 09/28/2014 SLP Individual Time: 1300-1400 SLP Individual Time Calculation (min): 60 min  Short Term Goals: Week 1: SLP Short Term Goal 1 (Week 1): Patient will demonstrate use of word finding strategies during structured tasks with Min verbal cues SLP Short Term Goal 2 (Week 1): Patient will utilize external aids to assist with recall of new information with Min question cues SLP Short Term Goal 3 (Week 1): Patient will demonstrate complex problem solving with Min question cues to self-monitor and correct errors  Skilled Therapeutic Interventions: Skilled ST intervention provided with tx focus on expressive communication and problem solving goals. Pt seen in room for ST individualized tx session. Decreased problem solving targeted through complex sequencing tasks, which pt completed with 80% accuracy, min A. Pt oriented x4 with use of external aides. Proper use of call bell observed x1 during session. Pt required cuing to increase word finding x2 at conversation level, pt self-corrected x1.    FIM:  Comprehension Comprehension Mode: Auditory Comprehension: 5-Understands complex 90% of the time/Cues < 10% of the time Expression Expression Mode: Verbal Expression: 5-Expresses basic 90% of the time/requires cueing < 10% of the time. Social Interaction Social Interaction: 5-Interacts appropriately 90% of the time - Needs monitoring or encouragement for participation or interaction. Problem Solving Problem Solving: 5-Solves basic 90% of the time/requires cueing < 10% of the time Memory Memory: 6-More than reasonable amt of time FIM - Eating Eating Activity: 5: Set-up assist for open containers  Pain Pain Assessment Pain Assessment: No/denies pain Pain Score: 8  Pain Type: Chronic pain Pain Location: Knee Pain Orientation: Right;Left Pain  Descriptors / Indicators: Sore Pain Onset: On-going Pain Intervention(s): Medication (See eMAR)  Therapy/Group: Individual Therapy  Niara Bunker, Bernerd Pho 09/28/2014, 2:03 PM

## 2014-09-28 NOTE — Progress Notes (Signed)
Physical Therapy Session Note  Patient Details  Name: [name] MRN: [id number] Date of Birth: [birth year]  Today's Date: 09/28/2014 PT Individual Time: 1105-1210 PT Individual Time Calculation (min): 65 min   Short Term Goals: Week 1:  PT Short Term Goal 1 (Week 1): = LTGs due to anticipated LOS  Skilled Therapeutic Interventions/Progress Updates:  Pt resting in supine. Supine> sit with supervision.  Pt donned bil knee braces with mod assist, slowly.    Gait in room to toilet with RW, close supervision.  See FIM. Pt urinated but did not have a BM.  Gait 100' with close supervision, x 2.  Advanced gait stepping over canes and around cones with supervision, RW  Up/down 5 steps 2 rails, self selected step- to pattern leading with less painful RLE up, and leading with LLE down, close supervision.  neuromuscular re-education via demo, VCs, for: -trunk shortening/lengthening while reaching out of BOS with either hand -facilitation of anterior pelvic tilt -facilitation of balance strategies in standing on compliant mat; 5 x 1 calf raises; 10 x 1 toe raises  Pt requested sitting EOB to eat lunch.  Bed alarm set and all needs placed within reach.    Therapy Documentation Precautions:  Precautions Precautions: Fall Required Braces or Orthoses: Other Brace/Splint Other Brace/Splint: B knee braces Restrictions Weight Bearing Restrictions: No   Vital Signs: Pulse Rate: 77 SpO2: 100 % O2 Device: Not Delivered Pain: Pain Assessment Pain Assessment: No/denies pain Pain Score: 6    Locomotion : Ambulation Ambulation/Gait Assistance: 5: Supervision;4: Min guard    See FIM for current functional status  Therapy/Group: Individual Therapy  [name] 09/28/2014, 4:42 PM

## 2014-09-28 NOTE — Progress Notes (Signed)
Occupational Therapy Session Note  Patient Details  Name: [name] MRN: [id number] Date of Birth: Jul 18, 1951  Today's Date: 09/28/2014 OT Individual Time: 0900-1000 OT Individual Time Calculation (min): 60 min    Short Term Goals: Week 1:  OT Short Term Goal 1 (Week 1): Pt will perform all LB bathing sit to stand with supervision shower level. OT Short Term Goal 2 (Week 1): Pt will perform toilet transfers and all aspects of toileting with supervision. OT Short Term Goal 3 (Week 1): Pt will perform shower transfers with LRAD and supervision for walk-in. OT Short Term Goal 4 (Week 1): Pt will independently perform RUE FM and gross motor coordination exercises following handout.  OT Short Term Goal 5 (Week 1): Pt will complete all dressing sit to stand at the sink with supervision.    Skilled Therapeutic Interventions/Progress Updates:   Patient seen for ADL retraining and instruction shower level this morning. Patient agreeable to treatment.  Patient completes supine to sit EOB with supervision.  Patient ambulates with RW with min assistance to shower seat.  Patient completes bathing seated with supervision and stands to wash peri area/buttocks with close SBA.  Patient instructed to use grab bars for safety while standing.  Patient completes dressing wheelchair level at the sink.  Patient completes UB dressing with setup and LB dressing with min assistance.  Patient instructed in technique to cross BLEs over opposite knees to reach feet to don socks/shoes and tie shoes.  Patient grooms at the sink with supervision to brush teeth and shave.  Patient transfers wheelchair to bed at end of session with min assist.  EOB to supine with supervision.  Bed alarm in the on position with needs in place.  Patient with good tolerance for session.  Remains motivated with good rehab potential.   Therapy Documentation Precautions:  Precautions Precautions: Fall Required Braces or Orthoses: Other  Brace/Splint Other Brace/Splint: B knee braces Restrictions Weight Bearing Restrictions: No Vital Signs: Therapy Vitals BP: (!) 162/79 mmHg Pain: Pain Assessment Pain Assessment: 0-10 Pain Score: 8  Pain Type: Chronic pain Pain Location: Knee Pain Orientation: Right;Left Pain Descriptors / Indicators: Sore Pain Onset: On-going Pain Intervention(s): Medication (See eMAR)  See FIM for current functional status  Therapy/Group: Individual Therapy  Osa Craver 09/28/2014, 12:36 PM

## 2014-09-28 NOTE — Progress Notes (Signed)
   Subjective/Complaints: No complaints States he was taking restasis at home bid   Objective: Vital Signs: Blood pressure 175/106, pulse 83, temperature 98.3 F (36.8 C), temperature source Oral, resp. rate 18, height 5\' 10"  (1.778 m), weight 161 lb (73.029 kg), SpO2 100 %.  Elderly, thin male in NAD Chest- cta cv- reg rate abd- soft, NT Ext- no edema  Assessment/Plan: 1. Functional deficits secondary to Left basal ganglia infarct   Medical Problem List and Plan: 1. Functional deficits secondary to Left basal ganglia infarct.   2.  DVT Prophylaxis/Anticoagulation: Pharmaceutical: Lovenox 3. Pain Management: - denies pain 09/28/2014  Pt with severe medial compartment OA- rec ortho surgery eval in ~72mo 4. Mood: No signs of distress noted. LCSW to follow for evaluation and support.   5. Neuropsych: This patient is is  capable of making decisions on his own behalf. 6. Skin/Wound Care: Encourage pressure relief measures. Mobility. Maintain adequate nutritional and hydration status.   7. Fluids/Electrolytes/Nutrition: Monitor I/O. Check lytes in am.   8. Elevated LFTs: Improving--likely reactive. Hepatitis panel negative.  Will recheck in am. Abdominal Ultrasound negative.   9. HTN: 146/78-175/106 Currently on amodipine 10mg  bid and metoprolol 50mg  bid Add lisinopril   LOS (Days) 3 A FACE TO [place] 09/28/2014, 6:32 AM

## 2014-09-29 ENCOUNTER — Inpatient Hospital Stay (HOSPITAL_COMMUNITY): Payer: Medicare HMO

## 2014-09-29 NOTE — Progress Notes (Signed)
   Subjective/Complaints: He feels well Denies pain Eating well  Objective: Vital Signs: Blood pressure 145/88, pulse 66, temperature 98 F (36.7 C), temperature source Oral, resp. rate 18, height $RemoveBe'5\' 10"'RfSuzqsyT$  (1.778 m), weight 161 lb (73.029 kg), SpO2 100 %.  Elderly, thin male in NAD Chest- cta cv- reg rate abd- soft, NT Ext- no edema  Assessment/Plan: 1. Functional deficits secondary to Left basal ganglia infarct   Medical Problem List and Plan: 1. Functional deficits secondary to Left basal ganglia infarct.   2.  DVT Prophylaxis/Anticoagulation: Pharmaceutical: Lovenox 3. Pain Management: - denies pain  Pt with severe medial compartment OA- rec ortho surgery eval in ~13mo 4. Mood: No signs of distress noted. LCSW to follow for evaluation and support.   5. Neuropsych: This patient is is  capable of making decisions on his own behalf. 6. Skin/Wound Care: Encourage pressure relief measures. Mobility. Maintain adequate nutritional and hydration status.   7. Fluids/Electrolytes/Nutrition: Monitor I/O. Check lytes in am.   8. Elevated LFTs: Improving-- Hepatic Function Latest Ref Rng 09/26/2014 09/24/2014 09/23/2014  Total Protein 6.0 - 8.3 g/dL 6.3 6.0 6.1  Albumin 3.5 - 5.2 g/dL 2.9(L) 2.9(L) 3.0(L)  AST 0 - 37 U/L 50(H) 58(H) 74(H)  ALT 0 - 53 U/L 47 48 56(H)  Alk Phosphatase 39 - 117 U/L 61 52 56  Total Bilirubin 0.3 - 1.2 mg/dL 0.7 0.7 0.8   Hepatitis panels negative  9. HTN: 134/72-168/68  Seems to be improving Currently on amodipine $RemoveBefo'10mg'AuJZSrRsuqa$  bid and metoprolol $RemoveBefore'50mg'sRvLcHHoRXvJX$  bid Add lisinopril on 09/28/14--   LOS (Days) 4 A FACE TO [place] 09/29/2014, 8:31 AM

## 2014-09-29 NOTE — Progress Notes (Signed)
Physical Therapy Session Note  Patient Details  Name: [name] MRN: [id number] Date of Birth: 1951/02/01  Today's Date: 09/29/2014 PT Individual Time: 1300-1400 PT Individual Time Calculation (min): 60 min   Short Term Goals: Week 1:  PT Short Term Goal 1 (Week 1): = LTGs due to anticipated LOS  Skilled Therapeutic Interventions/Progress Updates:    Pt received supine in bed, agreeable to participate in therapy. Session focused on standing balance, NMR for R, generalized strength/endurance, stair training. Pt ambulated to/from rehab gym w/ RW and close S-SBA. Instructed pt in reaching/grasping task with clothespins for R NMR requiring weight shifting to R and use of RUE with pt standing on compliant surface to encourage ankle strategy of balance. For general knee ROM to remediate pain instructed pt in Nustep L3 for 10' with pt reporting increased comfort in knees afterwards. Instructed pt in ascending/descending 6 stairs w/ B rails and SBA with pt feeling more comfortable with ascending w/ R leg and descending with L leg. Session ended in pt's room, where pt was left supine in bed w/ bed alarm on w/ all needs within reach.    Therapy Documentation Precautions:  Precautions Precautions: Fall Required Braces or Orthoses: Other Brace/Splint Other Brace/Splint: B knee braces Restrictions Weight Bearing Restrictions: No Pain: Pain Assessment Pain Assessment: 0-10 Pain Score: 7  Pain Type: Chronic pain Pain Location: Knee Pain Orientation: Right;Left Pain Descriptors / Indicators: Aching Pain Onset: On-going Pain Intervention(s): Gentle ROM on Nustep for knees  See FIM for current functional status  Therapy/Group: Individual Therapy  Rada Hay  Rada Hay, PT, DPT 09/29/2014, 7:35 AM

## 2014-09-30 ENCOUNTER — Inpatient Hospital Stay (HOSPITAL_COMMUNITY): Payer: Medicare HMO | Admitting: Occupational Therapy

## 2014-09-30 ENCOUNTER — Inpatient Hospital Stay (HOSPITAL_COMMUNITY): Payer: Medicare HMO | Admitting: Speech Pathology

## 2014-09-30 ENCOUNTER — Inpatient Hospital Stay (HOSPITAL_COMMUNITY): Payer: Medicare HMO | Admitting: Physical Therapy

## 2014-09-30 NOTE — Plan of Care (Signed)
Problem: RH PAIN MANAGEMENT Goal: RH STG PAIN MANAGED AT OR BELOW PT'S PAIN GOAL < 3 on 0-10 pain scale.  Outcome: Progressing No c/o pain. Pain managed with Voltaren Gel

## 2014-09-30 NOTE — Progress Notes (Signed)
Physical Therapy Session Note  Patient Details  Name: [name] MRN: [id number] Date of Birth: Apr 23, 1951  Today's Date: 09/30/2014 PT Individual Time: 0830-0930 PT Individual Time Calculation (min): 60 min   Short Term Goals: Week 1:  PT Short Term Goal 1 (Week 1): = LTGs due to anticipated LOS  Skilled Therapeutic Interventions/Progress Updates:   Pt received semi reclined in bed. Pt transferred to edge of bed and donned bilateral knee braces, pants, and slippers with significantly increased time and supervision. Gait training using RW x 100 ft, x 75 ft, and x 180 ft with distant supervision. Pt performed car transfer to sedan height using RW with supervision and verbal cues for safety and technique. Pt reported he will most likely need to take the bus for transportation to appointments, etc. With questioning regarding transportation PTA, pt reported he was not going to doctor appointments and his sister does not drive. Pt performed NuStep level 3 x 3 min using BUE/BLE progressed to x 7 min using BLE only for strengthening, ROM, and activity tolerance.  In stairwell, pt negotiated up/down 12 stairs using 2 rails with close supervision and greatly increased time, self-electing step-to pattern leading with RLE ascending. Pt performed sit <> stand with UE support x 10 and verbal cues for hand placement for eccentric control and BLE LAQ for knee ROM/strengthening with 3-5 sec hold x 10 each LE. Pt ambulated back to room and left sitting edge of bed with bed alarm on, needs within reach.  Therapy Documentation Precautions:  Precautions Precautions: Fall Required Braces or Orthoses: Other Brace/Splint Other Brace/Splint: B knee braces Restrictions Weight Bearing Restrictions: No Pain: Pain Assessment Pain Assessment: No/denies pain  See FIM for current functional status  Therapy/Group: Individual Therapy  [name] 09/30/2014, 10:06 AM

## 2014-09-30 NOTE — Progress Notes (Signed)
Speech Language Pathology Daily Session Note  Patient Details  Name: [name] MRN: [id number] Date of Birth: May 08, 1951  Today's Date: 09/30/2014 SLP Individual Time: 1300-1400 SLP Individual Time Calculation (min): 60 min  Short Term Goals: Week 1: SLP Short Term Goal 1 (Week 1): Patient will demonstrate use of word finding strategies during structured tasks with Min verbal cues SLP Short Term Goal 2 (Week 1): Patient will utilize external aids to assist with recall of new information with Min question cues SLP Short Term Goal 3 (Week 1): Patient will demonstrate complex problem solving with Min question cues to self-monitor and correct errors  Skilled Therapeutic Interventions: Skilled treatment session focused on addressing cognitive-linguistic goals.  Upon SLP arrival patient confused as to why therapy was there; he had thought he was done for the day.  SLP then facilitated session with Supervision level verbal cues to utilize external aid/schedule for recall of daily events.  SLP also facilitated session with a structured naming task and Min cues to utilize previously taught word finding strategies throughout the completion of the task.  Continue with current plan of care.    FIM:  Comprehension Comprehension Mode: Auditory Comprehension: 5-Understands complex 90% of the time/Cues < 10% of the time Expression Expression Mode: Verbal Expression: 5-Expresses complex 90% of the time/cues < 10% of the time Social Interaction Social Interaction: 6-Interacts appropriately with others with medication or extra time (anti-anxiety, antidepressant). Problem Solving Problem Solving: 5-Solves basic problems: With no assist Memory Memory: 5-Requires cues to use assistive device FIM - Eating Eating Activity: 6: More than reasonable amount of time  Pain Pain Assessment Pain Assessment: No/denies pain  Therapy/Group: Individual Therapy  Carmelia Roller.,  [place] [phone]  [place] 09/30/2014, 6:53 PM

## 2014-09-30 NOTE — Plan of Care (Signed)
Problem: RH KNOWLEDGE DEFICIT Goal: RH STG INCREASE KNOWLEDGE OF HYPERTENSION Patient and family will verbalize understanding of importance of hypertension and medications pertaining.  Outcome: Progressing Pt requested dietary information that would affect hypertension.

## 2014-09-30 NOTE — Progress Notes (Signed)
64 y.o. 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 UDS positive for THC. 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 Carotid dopplers without significant Work up ongoing and neurology recommends ASA for secondary stroke prevention. 2D echo done revealing EF 60-65% with moderate LVH and grade 1 diastolic dysfunction. Dr. Leonie Man felt that patient with left subcortical infarct secondary to small vessel disease   Subjective/Complaints: Did steps with therapy Knee pain better with new orthoses and diclofenac gel  Review of Systems - Negative except knee pain, denies bowel or bladder issues   Objective: Vital Signs: Blood pressure 144/87, pulse 56, temperature 98.2 F (36.8 C), temperature source Oral, resp. rate 16, height 5\' 10"  (1.778 m), weight 73.029 kg (161 lb), SpO2 100 %. No results found. No results found for this or any previous visit (from the past 72 hour(s)).   HEENT: normal Cardio: RRR Resp: CTA B/L GI: BS positive Extremity:  No Edema Skin:   Intact Neuro: Alert/Oriented, Flat, Normal Sensory, Abnormal Motor 4/5 Right delt, bi, tri, grip, HF, KN ankle DF and Abnormal FMC Ataxic/ dec FMC Musc/Skel:  Other varus deformity Left knee with Left knee effusion Gen NAD   Assessment/Plan: 1. Functional deficits secondary to Left basal ganglia infarct which require 3+ hours per day of interdisciplinary therapy in a comprehensive inpatient rehab setting. Physiatrist is providing close team supervision and 24 hour management of active medical problems listed below. Physiatrist and rehab team continue to assess barriers to discharge/monitor patient  progress toward functional and medical goals. FIM: FIM - Bathing Bathing Steps Patient Completed: Chest, Right lower leg (including foot), Right Arm, Left lower leg (including foot), Left Arm, Front perineal area, Buttocks, Right upper leg, Left upper leg, Abdomen Bathing: 5: Supervision: Safety issues/verbal cues  FIM - Upper Body Dressing/Undressing Upper body dressing/undressing steps patient completed: Thread/unthread right sleeve of pullover shirt/dresss, Thread/unthread left sleeve of pullover shirt/dress, Put head through opening of pull over shirt/dress, Pull shirt over trunk Upper body dressing/undressing: 5: Set-up assist to: Obtain clothing/put away FIM - Lower Body Dressing/Undressing Lower body dressing/undressing steps patient completed: Thread/unthread right underwear leg, Thread/unthread left underwear leg, Pull underwear up/down, Pull pants up/down, Don/Doff right sock, Don/Doff left sock Lower body dressing/undressing: 4: Min-Patient completed 75 plus % of tasks  FIM - Toileting Toileting steps completed by patient: Adjust clothing prior to toileting, Performs perineal hygiene, Adjust clothing after toileting Toileting Assistive Devices: Grab bar or rail for support Toileting: 5: Supervision: Safety issues/verbal cues  FIM - Radio producer Devices: Environmental consultant, Product manager Transfers: 5-To toilet/BSC: Supervision (verbal cues/safety issues), 5-From toilet/BSC: Supervision (verbal cues/safety issues)  FIM - Control and instrumentation engineer Devices: Walker, Arm rests Bed/Chair Transfer: 5: Sit > Supine: Supervision (verbal cues/safety issues), 5: Bed > Chair or W/C: Supervision (verbal cues/safety issues), 5: Chair or W/C > Bed: Supervision (verbal cues/safety issues), 5: Supine > Sit: Supervision (verbal cues/safety issues)  FIM - Locomotion: Wheelchair Distance: 30 Locomotion: Wheelchair: 0: Activity did not occur FIM -  Locomotion: Ambulation Locomotion: Ambulation Assistive Devices: Administrator, Other (comment), Orthosis Ambulation/Gait Assistance: 5: Supervision, 4: Min guard Locomotion: Ambulation: 4: Travels 150 ft or more with minimal assistance (Pt.>75%)  Comprehension Comprehension Mode: Auditory Comprehension: 6-Follows complex conversation/direction: With extra time/assistive device  Expression Expression Mode: Verbal Expression: 5-Expresses basic needs/ideas: With extra time/assistive device  Social Interaction Social Interaction: 5-Interacts appropriately 90% of the time - Needs monitoring or encouragement for participation or interaction.  Problem Solving Problem Solving: 5-Solves basic problems: With no assist  Memory Memory: 6-More than reasonable amt of time   Medical Problem List and Plan: 1. Functional deficits secondary to Left basal ganglia infarct.   2.  DVT Prophylaxis/Anticoagulation: Pharmaceutical: Lovenox 3. Pain Management: continue to use knee braces. Ice and Tylenol prn for bilateral knee pain.Diclofenac gel -will ask [facility] to assess for new bilateral knee braces to control varus better and to decrease pain, voltaren gel Pt with severe medial compartment OA- rec ortho surgery eval in ~31mo 4. Mood: No signs of distress noted. LCSW to follow for evaluation and support.   5. Neuropsych: This patient is is  capable of making decisions on his own behalf. 6. Skin/Wound Care: Encourage pressure relief measures. Mobility. Maintain adequate nutritional and hydration status.   7. Fluids/Electrolytes/Nutrition: Monitor I/O. Check lytes in am.   8. Elevated LFTs: Improving--likely reactive. Hepatitis panel negative.  Will recheck in am. Abdominal Ultrasound negative.   9. HTN: Will monitor tid. Continue Norvasc and metoprolol. Educate patient on importance on medication adherence.   LOS (Days) 5 A FACE TO FACE EVALUATION WAS PERFORMED  KIRSTEINS,ANDREW  E 09/30/2014, 6:57 AM

## 2014-09-30 NOTE — Progress Notes (Signed)
Recreational Therapy Assessment and Plan  Patient Details  Name: [name] MRN: [id number] Date of Birth: 1950-11-23 Today's Date: 09/30/2014  Rehab Potential: Good ELOS: 7 days   Assessment Clinical Impression: Problem List:  Patient Active Problem List   Diagnosis Date Noted  . Right hemiparesis 09/25/2014  . Other secondary osteoarthritis of both knees 09/25/2014  . Basal ganglia infarction 09/25/2014  . Ischemic stroke 09/22/2014  . Dyslipidemia 09/22/2014  . Thrombocytopenia 09/22/2014  . Essential hypertension   . Stroke 09/21/2014  . Uncontrolled hypertension 09/21/2014    Past Medical History:  Past Medical History  Diagnosis Date  . Knee injury     right knee injury   . Hypertension   . Glaucoma   . Cataracts, bilateral    Past Surgical History:  Past Surgical History  Procedure Laterality Date  . No past surgeries      Assessment & Plan Clinical Impression: Patient is a 64 y.o. year old male with recent admission to the hospital on 09/21/14 with new onset of right sided weakness with difficulty walking for about a week PTA. UDS positive for THC. 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 Patient transferred to CIR on 09/25/2014.      Pt presents with decreased activity tolerance, decreased functional mobility, decreased balance, decreased coordination Limiting pt's independence with leisure/community pursuits.   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, jewellery (going to [employer], walking) Other Leisure Interests: Television Leisure Participation Style: Alone;With Family/Friends Psychosocial /  Spiritual Social interaction - Mood/Behavior: Cooperative Academic librarian Appropriate for Education?: Yes Recreational Therapy Orientation Orientation -Reviewed with patient: Available activity resources Strengths/Weaknesses Patient Strengths/Abilities: Willingness to participate;Active premorbidly Patient weaknesses: Physical limitations TR Patient demonstrates impairments in the following area(s): Endurance;Motor  Plan Rec Therapy Plan Is patient appropriate for Therapeutic Recreation?: Yes Rehab Potential: Good Treatment times per week: MIn 1 time per week >20 minutes Estimated Length of Stay: 7 days TR Treatment/Interventions: Cognitive remediation/compensation;Patient/family education;Therapeutic activities;Recreation/leisure participation;Therapeutic exercise;UE/LE Coordination activities  Recommendations for other services: None  Discharge Criteria: Patient will be discharged from TR if patient refuses treatment 3 consecutive times without medical reason.  If treatment goals not met, if there is a change in medical status, if patient makes no progress towards goals or if patient is discharged from hospital.  The above assessment, treatment plan, treatment alternatives and goals were discussed and mutually agreed upon: by patient  [place] 09/30/2014, 11:44 AM

## 2014-09-30 NOTE — Progress Notes (Signed)
Occupational Therapy Session Note  Patient Details  Name: Kenta Laster MRN: [id number] Date of Birth: [birth year]  Today's Date: 09/30/2014 OT Individual Time: 1000-1100 OT Individual Time Calculation (min): 60 min    Short Term Goals: Week 1:  OT Short Term Goal 1 (Week 1): Pt will perform all LB bathing sit to stand with supervision shower level. OT Short Term Goal 2 (Week 1): Pt will perform toilet transfers and all aspects of toileting with supervision. OT Short Term Goal 3 (Week 1): Pt will perform shower transfers with LRAD and supervision for walk-in. OT Short Term Goal 4 (Week 1): Pt will independently perform RUE FM and gross motor coordination exercises following handout.  OT Short Term Goal 5 (Week 1): Pt will complete all dressing sit to stand at the sink with supervision.    Skilled Therapeutic Interventions/Progress Updates:    Pt performed shower and dressing during the session.  He was able to ambulate with the RW with supervision to the shower but continues to need min instructional cueing for hand placement on the right side and for reaching back to the bed and chair when sitting.  He was able to complete all bathing with supervision as well as dressing.  Mod demonstrational cueing for sequencing donning his bilateral Donn Joy braces.  He was able to transfer to the 3:1 over the toilet with supervision and complete all aspects of toileting with supervision as well.    Therapy Documentation Precautions:  Precautions Precautions: Fall Required Braces or Orthoses: Other Brace/Splint Other Brace/Splint: B knee braces Restrictions Weight Bearing Restrictions: No  Pain: Pain Assessment Pain Assessment: Faces Faces Pain Scale: Hurts a little bit Pain Type: Chronic pain Pain Location: Knee Pain Orientation: Right;Left Pain Intervention(s): Medication (See eMAR) ADL: See FIM for current functional status  Therapy/Group: Individual Therapy  Anatole Apollo  OTR/L 09/30/2014, 11:20 AM

## 2014-10-01 ENCOUNTER — Inpatient Hospital Stay (HOSPITAL_COMMUNITY): Payer: Medicare HMO | Admitting: Speech Pathology

## 2014-10-01 ENCOUNTER — Inpatient Hospital Stay (HOSPITAL_COMMUNITY): Payer: Medicare HMO | Admitting: Occupational Therapy

## 2014-10-01 ENCOUNTER — Inpatient Hospital Stay (HOSPITAL_COMMUNITY): Payer: Medicare HMO | Admitting: *Deleted

## 2014-10-01 ENCOUNTER — Inpatient Hospital Stay (HOSPITAL_COMMUNITY): Payer: Medicare HMO | Admitting: Physical Therapy

## 2014-10-01 MED ORDER — METOPROLOL TARTRATE 50 MG PO TABS
50.0000 mg | ORAL_TABLET | Freq: Two times a day (BID) | ORAL | Status: DC
  Administered 2014-10-01 – 2014-10-03 (×3): 50 mg via ORAL
  Filled 2014-10-01 (×7): qty 1

## 2014-10-01 NOTE — Plan of Care (Deleted)
Problem: RH BLADDER ELIMINATION Goal: RH STG MANAGE BLADDER WITH ASSISTANCE STG Manage Bladder With Assistance. Mod I  Pt has urgency and has occasional incontinence  Problem: RH PAIN MANAGEMENT Goal: RH STG PAIN MANAGED AT OR BELOW PT'S PAIN GOAL < 3 on 0-10 pain scale.  Outcome: Progressing Pt having pain in back and down into feet. Dilaudid and Robaxin being used PRN

## 2014-10-01 NOTE — Progress Notes (Signed)
Physical Therapy Session Note  Patient Details  Name: [name] MRN: [id number] Date of Birth: 1950-12-30  Today's Date: 10/01/2014 PT Individual Time: 0800-0900 PT Individual Time Calculation (min): 60 min   Short Term Goals: Week 1:  PT Short Term Goal 1 (Week 1): = LTGs due to anticipated LOS  Skilled Therapeutic Interventions/Progress Updates:   Pt received semi reclined in bed, agreeable to therapy. Pt transferred to edge of bed and donned bilateral knee braces with greatly increased time, assist for donning pants and house slippers at edge of bed due to time constraints. Gait training using RW x 150 ft with distant supervision. Progressed to gait training trialling patient's personal NBQC but noted to be uneven, therefore trialled [facility] x 25 ft in controlled environment. Pt required initial demonstration of sequencing 3-point gait pattern and total verbal cues for sequencing gait with overall supervision for balance. Pt with difficulty coordinating cane and demonstrated decreased foot clearance R > L due to chronic knee problems. Gait training without device x 25 ft with min guard for balance. Pt reporting 7/10 knee pain following gait training. Currently recommend that patient continue to use RW for increased UE support to offload weight from knees and decrease knee pain. Pt performed NuStep Level 3 using BLE only x 10 min for pain management, knee ROM, and activity tolerance. Pt reporting no pain following NuStep. In stairwell, pt negotiated up/down flight of stairs using 2 rails with step-to pattern ascending leading with RLE and descending leading with LLE at supervision level. Pt ambulated back to room x 200 ft using RW with supervision and left sitting edge of bed with all needs within reach.   Therapy Documentation Precautions:  Precautions Precautions: Fall Required Braces or Orthoses: Other Brace/Splint Other Brace/Splint: B knee braces Restrictions Weight Bearing  Restrictions: No Pain: Pain Assessment Pain Assessment: 0-10 Pain Score: 7  Pain Type: Chronic pain Pain Location: Knee Pain Orientation: Right;Left Pain Descriptors / Indicators: Aching Pain Onset: With Activity Pain Intervention(s): Repositioned;Ambulation/increased activity;Other (Comment) (NuStep)  See FIM for current functional status  Therapy/Group: Individual Therapy  [name] 10/01/2014, 9:02 AM

## 2014-10-01 NOTE — Progress Notes (Signed)
Occupational Therapy Session Note  Patient Details  Name: [name] MRN: [id number] Date of Birth: 07-Sep-1950  Today's Date: 10/01/2014 OT Individual Time: 0902-1002 OT Individual Time Calculation (min): 60 min    Short Term Goals: Week 1:  OT Short Term Goal 1 (Week 1): Pt will perform all LB bathing sit to stand with supervision shower level. OT Short Term Goal 2 (Week 1): Pt will perform toilet transfers and all aspects of toileting with supervision. OT Short Term Goal 3 (Week 1): Pt will perform shower transfers with LRAD and supervision for walk-in. OT Short Term Goal 4 (Week 1): Pt will independently perform RUE FM and gross motor coordination exercises following handout.  OT Short Term Goal 5 (Week 1): Pt will complete all dressing sit to stand at the sink with supervision.    Skilled Therapeutic Interventions/Progress Updates:   Pt performed shower and dressing to begin session.  He was able to doff all of his clothing sit to stand, including his bilateral knee braces before transferring to the shower bench.  He bathed sit to stand with supervision and then transferred out to the EOB for dressing.  He continues to need min instructional cueing to back all the way up to the surface he is going to sit on and then reach back with his hands.  He tends to flop down without controlled descent on many occasions.  He was able to complete all dressing with supervision, but did need mod demonstrational cueing for donning his knee braces.  Concluded session with work on [employer] FM coordination and strengthening with use of theraputty. Pt was given handout as well as education on theraputty exercises.  Pt needed mod instructional cueing for correct technique.   Therapy Documentation Precautions:  Precautions Precautions: Fall Required Braces or Orthoses: Other Brace/Splint Other Brace/Splint: B knee braces Restrictions Weight Bearing Restrictions: No  Pain: Pain Assessment Pain Assessment:  0-10 Pain Score: 7  Pain Type: Chronic pain Pain Location: Knee Pain Orientation: Right;Left Pain Descriptors / Indicators: Aching Pain Onset: With Activity Pain Intervention(s): Repositioned;Ambulation/increased activity;Other (Comment) (NuStep) ADL: See FIM for current functional status  Therapy/Group: Individual Therapy  Anaira Seay OTR/L 10/01/2014, 12:34 PM

## 2014-10-01 NOTE — Progress Notes (Signed)
Speech Language Pathology Daily Session Note  Patient Details  Name: [name] MRN: [id number] Date of Birth: 01-09-1951  Today's Date: 10/01/2014 SLP Individual Time: 1400-1500 SLP Individual Time Calculation (min): 60 min  Short Term Goals: Week 1: SLP Short Term Goal 1 (Week 1): Patient will demonstrate use of word finding strategies during structured tasks with Min verbal cues SLP Short Term Goal 2 (Week 1): Patient will utilize external aids to assist with recall of new information with Min question cues SLP Short Term Goal 3 (Week 1): Patient will demonstrate complex problem solving with Min question cues to self-monitor and correct errors  Skilled Therapeutic Interventions: Skilled treatment session focused on addressing cognitive-linguistic goals.  Patient recalled therapy schedule today and was ready for session.  SLP facilitated session with a semi-structured naming task and increased time for patient to complete a generative naming task.  Following a 30 minute delay patient was able to recall items he had named with Supervision level verbal cues.  SLP also facilitated session with a list of 3 things to locate during a scavenger hunt while returning to room, which patient completed with increased time.  Continue with current plan of care.   FIM:  Comprehension Comprehension Mode: Auditory Comprehension: 6-Follows complex conversation/direction: With extra time/assistive device Expression Expression Mode: Verbal Expression: 5-Expresses complex 90% of the time/cues < 10% of the time Social Interaction Social Interaction: 6-Interacts appropriately with others with medication or extra time (anti-anxiety, antidepressant). Problem Solving Problem Solving: 5-Solves complex 90% of the time/cues < 10% of the time Memory Memory: 6-More than reasonable amt of time FIM - Eating Eating Activity: 7: Complete independence:no helper  Pain Pain Assessment Pain Assessment: No/denies  pain  Therapy/Group: Individual Therapy  Carmelia Roller., [place] [phone]  [place] 10/01/2014, 4:48 PM

## 2014-10-01 NOTE — Progress Notes (Signed)
64 y.o. 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 UDS positive for THC. 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 Carotid dopplers without significant Work up ongoing and neurology recommends ASA for secondary stroke prevention. 2D echo done revealing EF 60-65% with moderate LVH and grade 1 diastolic dysfunction. Dr. 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 rate 17, height 5\' 10"  (1.778 m), weight 73.029 kg (161 lb), SpO2 100 %. No results found. No results found for this or any previous visit (from the past 72 hour(s)).   HEENT: normal Cardio: RRR Resp: CTA B/L GI: BS positive Extremity:  No Edema Skin:   Intact Neuro: Alert/Oriented, Flat, Normal Sensory, Abnormal Motor 4/5 Right delt, bi, tri, grip, HF, KN ankle DF and Abnormal FMC Ataxic/ dec FMC Musc/Skel:  Other varus deformity Left knee with Left knee effusion Gen NAD   Assessment/Plan: 1. Functional deficits secondary to Left basal ganglia infarct which require 3+ hours per day of interdisciplinary therapy in a comprehensive inpatient rehab setting. Physiatrist is providing close team supervision and 24 hour management of active medical problems listed below. Physiatrist and rehab team continue to assess barriers to discharge/monitor patient progress toward functional and medical  goals. FIM: FIM - Bathing Bathing Steps Patient Completed: Chest, Right lower leg (including foot), Right Arm, Left lower leg (including foot), Left Arm, Front perineal area, Buttocks, Right upper leg, Left upper leg, Abdomen Bathing: 5: Supervision: Safety issues/verbal cues  FIM - Upper Body Dressing/Undressing Upper body dressing/undressing steps patient completed: Thread/unthread right sleeve of pullover shirt/dresss, Thread/unthread left sleeve of pullover shirt/dress, Put head through opening of pull over shirt/dress, Pull shirt over trunk Upper body dressing/undressing: 5: Set-up assist to: Obtain clothing/put away FIM - Lower Body Dressing/Undressing Lower body dressing/undressing steps patient completed: Thread/unthread right underwear leg, Thread/unthread left underwear leg, Pull underwear up/down, Pull pants up/down, Don/Doff right sock, Don/Doff left sock, Thread/unthread left pants leg, Thread/unthread right pants leg Lower body dressing/undressing: 5: Supervision: Safety issues/verbal cues  FIM - Toileting Toileting steps completed by patient: Adjust clothing prior to toileting, Performs perineal hygiene, Adjust clothing after toileting Toileting Assistive Devices: Grab bar or rail for support Toileting: 5: Supervision: Safety issues/verbal cues  FIM - Radio producer Devices: Engineer, civil (consulting), Insurance account manager Transfers: 5-To toilet/BSC: Supervision (verbal cues/safety issues), 5-From toilet/BSC: Supervision (verbal cues/safety issues)  FIM - Control and instrumentation engineer Devices: Walker, Arm rests Bed/Chair Transfer: 5: Supine > Sit: Supervision (verbal cues/safety issues), 5: Chair or W/C > Bed: Supervision (verbal cues/safety issues), 5: Bed > Chair or W/C: Supervision (verbal cues/safety issues)  FIM - Locomotion: Wheelchair Distance: 30 Locomotion: Wheelchair: 0: Activity did not occur FIM - Locomotion: Ambulation Locomotion:  Ambulation Assistive Devices: Orthosis, Environmental consultant - Rolling (bilateral knee braces) Ambulation/Gait Assistance: 5: Supervision Locomotion: Ambulation: 5: Travels 150 ft or more with supervision/safety issues  Comprehension Comprehension Mode: Auditory Comprehension: 6-Follows complex conversation/direction: With extra time/assistive device  Expression  Expression Mode: Verbal Expression: 5-Expresses complex 90% of the time/cues < 10% of the time  Social Interaction Social Interaction: 6-Interacts appropriately with others with medication or extra time (anti-anxiety, antidepressant).  Problem Solving Problem Solving: 5-Solves complex 90% of the time/cues < 10% of the time  Memory Memory: 6-More than reasonable amt of time   Medical Problem List and Plan: 1. Functional deficits secondary to Left basal ganglia infarct.   2.  DVT Prophylaxis/Anticoagulation: Pharmaceutical: Lovenox 3. Pain Management: continue to use knee braces. Ice and Tylenol prn for bilateral knee pain.Diclofenac gel -will ask [facility] to assess for new bilateral knee braces to control varus better and to decrease pain, voltaren gel Pt with severe medial compartment OA- rec ortho surgery eval in ~47mo 4. Mood: No signs of distress noted. LCSW to follow for evaluation and support.   5. Neuropsych: This patient is is  capable of making decisions on his own behalf. 6. Skin/Wound Care: Encourage pressure relief measures. Mobility. Maintain adequate nutritional and hydration status.   7. Fluids/Electrolytes/Nutrition: Monitor I/O. Check lytes in am.   8. Elevated LFTs: Improving--likely reactive. Hepatitis panel negative. AST min elevation down to 50. Abdominal Ultrasound negative.   9. HTN: Will monitor tid. Continue Norvasc and metoprolol will write HR paramenters. Educate patient on importance on medication adherence.   LOS (Days) 6 A FACE TO FACE EVALUATION WAS PERFORMED  Thatcher Doberstein E 10/01/2014, 6:28 AM

## 2014-10-01 NOTE — Progress Notes (Signed)
Recreational Therapy Session Note  Patient Details  Name: [name] MRN: [id number] Date of Birth: July 25, 1951 Today's Date: 10/01/2014  Pain: no c/o Skilled Therapeutic Interventions/Progress Updates: Session focused on continues leisure interest assessment at bedside as pt with c/o of fatigue from previous therapies.  Discussed community reintegration with pt and pt agreeable to participate if team feels is appropriate during LOS.  Also provided diversional activity in room for use outside of therapy time per pt request.    Therapy/Group: Individual Therapy   Shayleigh Bouldin 10/01/2014, 3:17 PM

## 2014-10-01 NOTE — Plan of Care (Signed)
Problem: RH BOWEL ELIMINATION Goal: RH STG MANAGE BOWEL WITH ASSISTANCE STG Manage Bowel with Assistance. Mod I Outcome: Progressing No incontinent episode reported     

## 2014-10-02 ENCOUNTER — Inpatient Hospital Stay (HOSPITAL_COMMUNITY): Payer: Medicare HMO | Admitting: Speech Pathology

## 2014-10-02 ENCOUNTER — Inpatient Hospital Stay (HOSPITAL_COMMUNITY): Payer: Medicare HMO | Admitting: Physical Therapy

## 2014-10-02 ENCOUNTER — Inpatient Hospital Stay (HOSPITAL_COMMUNITY): Payer: Medicare HMO | Admitting: Occupational Therapy

## 2014-10-02 LAB — CREATININE, SERUM
CREATININE: 1.01 mg/dL (ref 0.50–1.35)
GFR calc Af Amer: 88 mL/min — ABNORMAL LOW (ref >90)
GFR calc non Af Amer: 76 mL/min — ABNORMAL LOW (ref >90)

## 2014-10-02 NOTE — Progress Notes (Signed)
Social Work Patient ID: [name], male   DOB: 15-May-1951, 64 y.o.   MRN: [id number]  Met with pt and sister today to review team conference.  Both pleased with progress and feeling ready for d/c tomorrow.  Sister requesting SCAT application be submitted - will take care of this.  [name], LCSW

## 2014-10-02 NOTE — Plan of Care (Signed)
Problem: RH Dressing Goal: LTG Patient will perform lower body dressing w/assist (OT) LTG: Patient will perform lower body dressing with assist, with/without cues in positioning using equipment (OT)  Outcome: Not Met (add Reason) Pt needs supervision

## 2014-10-02 NOTE — Progress Notes (Signed)
64 y.o. 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 UDS positive for THC. 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 Carotid dopplers without significant Work up ongoing and neurology recommends ASA for secondary stroke prevention. 2D echo done revealing EF 60-65% with moderate LVH and grade 1 diastolic dysfunction. Dr. 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 rate 16, height _0  (1.778 m), weight 73.029 kg (161 lb), SpO2 100 %. No results found. No results found for this or any previous visit (from the past 72 hour(s)).   HEENT: normal Cardio: RRR Resp: CTA B/L GI: BS positive Extremity:  No Edema Skin:   Intact Neuro: Alert/Oriented, Flat, Normal Sensory, Abnormal Motor 4/5 Right delt, bi, tri, grip, HF, KN ankle DF and Abnormal FMC Ataxic/ dec FMC Musc/Skel:  Other varus deformity Left knee with Left knee effusion Gen NAD   Assessment/Plan: 1. Functional deficits secondary to Left basal ganglia infarct which require 3+ hours per day of interdisciplinary therapy in a comprehensive inpatient rehab setting. Physiatrist is providing close team supervision and 24 hour management of active medical problems listed below. Physiatrist and rehab team continue to assess barriers to discharge/monitor patient progress toward functional and medical goals. Team  conference today please see physician documentation under team conference tab, met with team face-to-face to discuss problems,progress, and goals. Formulized individual treatment plan based on medical history, underlying problem and comorbidities. FIM: FIM - Bathing Bathing Steps Patient Completed: Chest, Right lower leg (including foot), Right Arm, Left lower leg (including foot), Left Arm, Front perineal area, Buttocks, Right upper leg, Left upper leg, Abdomen Bathing: 5: Supervision: Safety issues/verbal cues  FIM - Upper Body Dressing/Undressing Upper body dressing/undressing steps patient completed: Thread/unthread right sleeve of pullover shirt/dresss, Thread/unthread left sleeve of pullover shirt/dress, Put head through opening of pull over shirt/dress, Pull shirt over trunk Upper body dressing/undressing: 5: Set-up assist to: Obtain clothing/put away FIM - Lower Body Dressing/Undressing Lower body dressing/undressing steps patient completed: Thread/unthread right underwear leg, Thread/unthread left underwear leg, Pull underwear up/down, Pull pants up/down, Don/Doff right sock, Don/Doff left sock, Thread/unthread left pants leg, Thread/unthread right pants leg Lower body dressing/undressing: 5: Supervision: Safety issues/verbal cues  FIM - Toileting Toileting steps completed by patient: Adjust clothing prior to toileting, Performs perineal hygiene, Adjust clothing after toileting Toileting Assistive Devices: Grab bar or rail for support Toileting: 5: Supervision: Safety issues/verbal cues  FIM - Radio producer Devices: Engineer, civil (consulting), Insurance account manager Transfers: 5-To toilet/BSC: Supervision (verbal cues/safety issues), 5-From toilet/BSC: Supervision (verbal cues/safety issues)  FIM - Control and instrumentation engineer Devices: Walker, Arm rests Bed/Chair Transfer: 5: Supine > Sit: Supervision (verbal cues/safety issues), 5: Chair or W/C > Bed:  Supervision (verbal cues/safety issues), 5: Bed > Chair or W/C: Supervision (verbal cues/safety issues)  FIM - Locomotion: Wheelchair Distance: 30 Locomotion: Wheelchair: 0: Activity did not occur FIM - Locomotion: Ambulation Locomotion: Ambulation Assistive Devices: Orthosis, Environmental consultant - Rolling (bilateral  knee braces) Ambulation/Gait Assistance: 5: Supervision Locomotion: Ambulation: 5: Travels 150 ft or more with supervision/safety issues  Comprehension Comprehension Mode: Auditory Comprehension: 6-Follows complex conversation/direction: With extra time/assistive device  Expression Expression Mode: Verbal Expression: 5-Expresses complex 90% of the time/cues < 10% of the time  Social Interaction Social Interaction: 6-Interacts appropriately with others with medication or extra time (anti-anxiety, antidepressant).  Problem Solving Problem Solving: 5-Solves complex 90% of the time/cues < 10% of the time  Memory Memory: 7-Complete Independence: No helper   Medical Problem List and Plan: 1. Functional deficits secondary to Left basal ganglia infarct.   2.  DVT Prophylaxis/Anticoagulation: Pharmaceutical: Lovenox 3. Pain Management: continue to use knee braces. Ice and Tylenol prn for bilateral knee pain.Diclofenac gel - bilateral knee braces to control varus better and to decrease pain, voltaren gel Pt with severe medial compartment OA- rec ortho surgery eval in ~63mo4. Mood: No signs of distress noted. LCSW to follow for evaluation and support.   5. Neuropsych: This patient is is  capable of making decisions on his own behalf. 6. Skin/Wound Care: Encourage pressure relief measures. Mobility. Maintain adequate nutritional and hydration status.   7. Fluids/Electrolytes/Nutrition: Monitor I/O. Check lytes in am.   8. Elevated LFTs: Improving--likely reactive. Hepatitis panel negative. AST min elevation down to 50. Abdominal Ultrasound negative.   9. HTN: Will monitor tid. Continue  Norvasc and metoprolol will write HR paramenters. Educate patient on importance on medication adherence.   LOS (Days) 7 A FACE TO FACE EVALUATION WAS PERFORMED  KIRSTEINS,ANDREW E 10/02/2014, 7:38 AM

## 2014-10-02 NOTE — Progress Notes (Signed)
Speech Language Pathology Progress Note and Discharge Summary  Patient Details  Name: [name] MRN: [id number] Date of Birth: [birth year]  Today's Date: 10/02/2014 SLP Individual Time: 1420-1520 SLP Individual Time Calculation (min): 60 min  Skilled Therapeutic Interventions: Skilled treatment session focused on patient/family education. Student facilitated session by providing education to patient and sister in regards to his current cognitive-linguistic function and by providing strategies and handouts to utilize at home to increase word-finding ability, recall, and overall safety. Patient's sister also educated on patient's need for assistance with money and medication management and benefit of maintaining routine at home to encourage engagement in ADLs. Patient and his sister verbalized understanding of all information. Patient will discharge home with 24 hour supervision from sister.  Patient has met 3 of 3 long term goals.  Patient to discharge at overall Supervision level.   Reasons goals not met: n/a   Clinical Impression/Discharge Summary: Patient has met 3 out of 3 LTG's this reporting period due to increased functional problem-solving, utilization of external aids to assist in recall, and increased word-finding. At this time, patient requires Supervision A for functional problem solving and use of external aids. Per sister, patient has history of cognitive deficits PTA; suspect patient is at cognitive baseline but would benefit from follow-up SLP services for increased word-finding. Patient and family education completed and patient will discharge home with receive 24-hour supervision from sister. Care Partner:  Caregiver Able to Provide Assistance: Yes  Type of Caregiver Assistance: Cognitive  Recommendation:  24 hour supervision/assistance;Home Health SLP  Rationale for SLP Follow Up: Maximize cognitive function and independence;Maximize functional communication   Equipment:  n/a   Reasons for discharge: Treatment goals met;Discharged from hospital   Patient/Family Agrees with Progress Made and Goals Achieved: Yes   See FIM for current functional status  Servando Snare 10/02/2014, 5:33 PM

## 2014-10-02 NOTE — Patient Care Conference (Signed)
Inpatient RehabilitationTeam Conference and Plan of Care Update Date: 10/02/2014   Time: 11:25 am    Patient Name: [name]      Medical Record Number: [id number]  Date of Birth: Jun 08, 1951 Sex: Male         Room/Bed: 4M05C/4M05C-01 Payor Info: Payor: [employer] MEDICARE / Plan: [employer] MEDICARE HMO/PPO / Product Type: *No Product type* /    Admitting Diagnosis: cva  Admit Date/Time:  09/25/2014  6:44 PM Admission Comments: No comment available   Primary Diagnosis:  <principal problem not specified> Principal Problem: <principal problem not specified>  Patient Active Problem List   Diagnosis Date Noted  . Right hemiparesis 09/25/2014  . Other secondary osteoarthritis of both knees 09/25/2014  . Basal ganglia infarction 09/25/2014  . Ischemic stroke 09/22/2014  . Dyslipidemia 09/22/2014  . Thrombocytopenia 09/22/2014  . Essential hypertension   . Stroke 09/21/2014  . Uncontrolled hypertension 09/21/2014    Expected Discharge Date: Expected Discharge Date: 10/03/14  Team Members Present: Physician leading conference: Dr. Alysia Penna Social Worker Present: Lennart Pall, LCSW;Jenny Prevatt, LCSW Nurse Present: Heather Roberts, RN PT Present: Carney Living, PT OT Present: Clyda Greener, Rhetta Mura, OT SLP Present: Gunnar Fusi, SLP PPS Coordinator present : Daiva Nakayama, RN, CRRN     Current Status/Progress Goal Weekly Team Focus  Medical   no pain c/os, no new medical issues, RUE strngth improvement  Home D/C  D/C planning   Bowel/Bladder   Continent of bowel and bladder. LBM 09/30/14  Pt to remain continent of bowel and bladder  Monitor   Swallow/Nutrition/ Hydration             ADL's   supervision for all bathing, dressing, toileting, slight decreased coordination and strength in the LUE but uses functionally for most selfcare tasks with encouragement.    modified independent for all dressing, grooming, toileting, supervision for bathing  selfcare re-training,  balance re-training, neuromuscular re-education, therapeutic exercise, pt/family education   Mobility   currently supervision using RW for transfers and gait x 143ft, supervision up/down 12 stairs using 2 rails, min guard short distance gait without device  mod I except supervision for stairs, car transfer, and community ambulation  NMR, activity tolerance, safety with functional mobility, strengthening, pt education   Communication   Supervision   Supervision   education    Safety/Cognition/ Behavioral Observations  Supervision with complex   Supervision with complex tasks   education    Pain   Pain managed with Voltaren gel to bilateral knees  <3  Continue to monitor for effectiveness   Skin   CDI, Mild edema note to patella L>R  CDI  Routine turn q 2hrs    Rehab Goals Patient on target to meet rehab goals: Yes *See Care Plan and progress notes for long and short-term goals.  Barriers to Discharge: Still requires some physical assist    Possible Resolutions to Barriers:  Cont rehab    Discharge Planning/Teaching Needs:  home with sister who can provide intermittent assistance  family ed completed today   Team Discussion:  Making excellent gains and ready for d/c tomorrow.  Family ed completed with sister today.  Mod i overall except supervision with stairs  Revisions to Treatment Plan:  None   Continued Need for Acute Rehabilitation Level of Care: The patient requires daily medical management by a physician with specialized training in physical medicine and rehabilitation for the following conditions: Daily direction of a multidisciplinary physical rehabilitation program to ensure safe treatment while  eliciting the highest outcome that is of practical value to the patient.: Yes Daily medical management of patient stability for increased activity during participation in an intensive rehabilitation regime.: Yes Daily analysis of laboratory values and/or radiology reports with any  subsequent need for medication adjustment of medical intervention for : Neurological problems  Marty Uy 10/02/2014, 3:53 PM

## 2014-10-02 NOTE — Progress Notes (Signed)
Occupational Therapy Discharge Summary  Patient Details  Name: [name] MRN: [id number] Date of Birth: [birth year]  Today's Date: 10/02/2014 OT Individual Time: 0800-0904 OT Individual Time Calculation (min): 64 min   Session Note:  Pt performed shower and dressing during session.  Supervision for transfer to the walk-in shower with modified independence for completion of bathing.  He was able to ambulate to the EOB for dressing and was able to donn all clothing with modified independence however he continues to need supervision with mod demonstrational cueing for sequencing.  He then practiced transfer to the tub bench with use of the walker in the tub/shower room.  Supervision for transfer with pt also being educated on sequencing of task (washing and drying while in sitting, removing throw rugs and placing a towel down on the floor to dry his feet before attempting to stand).  Pt returned to room to complete grooming tasks in standing with RW for support.  His sister showed up at this time and therapist discussed his current level of assist with the need for supervision to perform shower transfer only.  Also discussed use of therapy putty and that a handout had been issued for RUE coordination.     Patient has met 12 of 13 long term goals due to improved activity tolerance, improved balance, functional use of  RIGHT upper and RIGHT lower extremity, improved attention and improved coordination.  Patient to discharge at overall Modified Independent level.  Patient's care partner is independent to provide the necessary physical and cognitive assistance at discharge.    Reasons goals not met: Pt continues to need supervision and cueing for dressing in order to correctly donn his bilateral knee braces.    Recommendation:  Patient will benefit from ongoing skilled OT services in home health setting to continue to advance functional skills in the area of BADL.  Feel pt will benefit from follow-up  HHOT to further progress independence with ADL function as well as increase coordination, strength, and functional use in the RUE.  Feel pt will need supervision for shower tub transfers with use of the walker and tub bench.  This has also been discussed with the pt and his sister and they voice understanding.   Equipment: tub bench and 3:1  Reasons for discharge: treatment goals met and discharge from hospital  Patient/family agrees with progress made and goals achieved: Yes  OT Discharge Precautions/Restrictions  Precautions Precautions: Fall Required Braces or Orthoses: Other Brace/Splint Other Brace/Splint: B knee braces Restrictions Weight Bearing Restrictions: No  Pain Pain Assessment Pain Assessment: No/denies pain ADL  See FIM scale for details  Vision/Perception  Vision- History Baseline Vision/History: Wears glasses Wears Glasses: At all times Patient Visual Report: No change from baseline Vision- Assessment Vision Assessment?: Yes Eye Alignment: Within Functional Limits Ocular Range of Motion: Within Functional Limits Alignment/Gaze Preference: Within Defined Limits Tracking/Visual Pursuits: Able to track stimulus in all quads without difficulty (Per gross assessment)  Cognition Overall Cognitive Status: Impaired/Different from baseline Arousal/Alertness: Awake/alert Orientation Level: Oriented X4 Attention: Selective Selective Attention: Appears intact Memory: Impaired Memory Impairment: Decreased short term memory Decreased Short Term Memory: Functional basic Awareness: Appears intact Problem Solving: Appears intact Safety/Judgment: Appears intact Comments: Pt continues to need min instructional cueing to use his UEs for stand to sit transition.   Sensation Sensation Light Touch: Appears Intact Stereognosis: Appears Intact Hot/Cold: Appears Intact Proprioception: Appears Intact Coordination Gross Motor Movements are Fluid and Coordinated: No Fine  Motor Movements are Fluid and Coordinated:  No Coordination and Movement Description: Pt with slight decreased coordination and dysmetria, needs min instructional cueing to use more in daily tasks for sit to stand and for pulling up his pants and underpants on the right side. Motor  Motor Motor: Hemiplegia;Abnormal postural alignment and control Motor - Discharge Observations: Mild R hemiparesis, long-standing history of bilateral knee pain and varus deformities L > R Mobility  Bed Mobility Bed Mobility: Supine to Sit;Sit to Supine Supine to Sit: HOB flat;6: Modified independent (Device/Increase time) Sit to Supine: HOB flat;6: Modified independent (Device/Increase time) Transfers Transfers: Sit to Stand;Stand to Sit Sit to Stand: With upper extremity assist;From bed;From chair/3-in-1;6: Modified independent (Device/Increase time);With armrests Stand to Sit: 6: Modified independent (Device/Increase time);With upper extremity assist;With armrests;To bed;To chair/3-in-1  Trunk/Postural Assessment  Cervical Assessment Cervical Assessment: Exceptions to [facility] (Pt demonstrates forward cervical protraction) Thoracic Assessment Thoracic Assessment: Within Functional Limits Lumbar Assessment Lumbar Assessment: Within Functional Limits Postural Control Postural Limitations: maintains posterior pelvic tilt  Balance Balance Balance Assessed: Yes Standardized Balance Assessment Standardized Balance Assessment: Timed Up and Go Test Timed Up and Go Test TUG: Normal TUG Normal TUG (seconds): 43 (avg of 3 trials using RW: 43, 42, 44 sec) Dynamic Sitting Balance Dynamic Sitting - Balance Support: Feet supported;During functional activity;No upper extremity supported Dynamic Sitting - Level of Assistance: 6: Modified independent (Device/Increase time) Static Standing Balance Static Standing - Balance Support: During functional activity;Bilateral upper extremity supported Static Standing - Level of  Assistance: 6: Modified independent (Device/Increase time) Dynamic Standing Balance Dynamic Standing - Balance Support: During functional activity;Bilateral upper extremity supported Dynamic Standing - Level of Assistance: 6: Modified independent (Device/Increase time) Extremity/Trunk Assessment RUE Assessment RUE Assessment: Exceptions to [facility] RUE Strength RUE Overall Strength Comments: Pt with overall AROM WFLS and strength 4/5 in the shoulder, elbow, and hand.  Decreased FM coordination noted with attempted opposition testing.  He was able to oppose his thumb to all digits with decreased accuracy however.  LUE Assessment LUE Assessment: Within Functional Limits  See FIM for current functional status  Khloi Rawl OTR/L 10/02/2014, 12:37 PM

## 2014-10-03 MED ORDER — ATORVASTATIN CALCIUM 10 MG PO TABS: 10.0000 mg | ORAL_TABLET | Freq: Every day | ORAL | Status: DC

## 2014-10-03 MED ORDER — AMLODIPINE BESYLATE 10 MG PO TABS: 10.0000 mg | ORAL_TABLET | Freq: Every day | ORAL | Status: DC

## 2014-10-03 MED ORDER — ASPIRIN 325 MG PO TABS: 325.0000 mg | ORAL_TABLET | Freq: Every day | ORAL | Status: DC

## 2014-10-03 MED ORDER — LISINOPRIL 10 MG PO TABS: 10.0000 mg | ORAL_TABLET | Freq: Every day | ORAL | Status: DC

## 2014-10-03 MED ORDER — CYCLOSPORINE 0.05 % OP EMUL: 1.0000 [drp] | Freq: Two times a day (BID) | OPHTHALMIC | Status: DC

## 2014-10-03 MED ORDER — METOPROLOL TARTRATE 50 MG PO TABS: 50.0000 mg | ORAL_TABLET | Freq: Two times a day (BID) | ORAL | Status: DC

## 2014-10-03 MED ORDER — ACETAMINOPHEN 325 MG PO TABS: 325.0000 mg | ORAL_TABLET | ORAL | Status: DC | PRN

## 2014-10-03 MED ORDER — DICLOFENAC SODIUM 1 % TD GEL: 2.0000 g | Freq: Four times a day (QID) | TRANSDERMAL | Status: DC

## 2014-10-03 MED ORDER — ASPIRIN 325 MG PO TABS
325.0000 mg | ORAL_TABLET | Freq: Every day | ORAL | Status: DC
Start: 2014-10-03 — End: 2014-12-02

## 2014-10-03 NOTE — Progress Notes (Signed)
Recreational Therapy Discharge Summary Patient Details  Name: [name] MRN: [id number] Date of Birth: 23-Mar-1951 Today's Date: 10/03/2014  Long term goals set: 1  Long term goals met: 1  Comments on progress toward goals: Pt has made great progress toward goal and is discharging home today at supervision level for TR tasks.   Pt requires set up assistance for simple tasks.  Pt discharging home with sister to provide intermittent supervision.   Reasons for discharge: discharge from hospital   Patient/family agrees with progress made and goals achieved: Yes  [name] 10/03/2014, 11:24 AM

## 2014-10-03 NOTE — Progress Notes (Signed)
10/03/14 1338 nsg Patient discharged to home today. Discharge instructions done by Algis Liming PA. Patient went home by wheelchair accompanied by NT and sister.

## 2014-10-03 NOTE — Discharge Summary (Signed)
Physician Discharge Summary  Patient ID: [name] MRN: [id number] DOB/AGE: 1950-08-10 64 y.o.  Admit date: 09/25/2014 Discharge date: 10/03/2014  Discharge Diagnoses:  Active Problems:   Ischemic stroke   Right hemiparesis   Other secondary osteoarthritis of both knees   Basal ganglia infarction   Discharged Condition: Stable    Labs:  Basic Metabolic Panel:    Component Value Date/Time   NA 138 09/26/2014 0555   K 3.4* 09/26/2014 0555   CL 104 09/26/2014 0555   CO2 27 09/26/2014 0555   GLUCOSE 98 09/26/2014 0555   BUN 9 09/26/2014 0555   CREATININE 1.01 10/02/2014 0641   CALCIUM 8.7 09/26/2014 0555   GFRNONAA 76* 10/02/2014 0641   GFRAA 88* 10/02/2014 0641     CBC: CBC Latest Ref Rng 09/26/2014 09/23/2014 09/22/2014  WBC 4.0 - 10.5 K/uL 5.5 3.9(L) -  Hemoglobin 13.0 - 17.0 g/dL 14.5 13.7 -  Hematocrit 39.0 - 52.0 % 43.7 41.6 42.1  Platelets 150 - 400 K/uL 153 152 -     CBG: No results for input(s): GLUCAP in the last 168 hours.  Brief HPI:   [name] is a 64 y.o. 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 UDS positive for THC. 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 Neurology recommended ASA for  left subcortical infarct secondary to small vessel disease and recommended aggressive risk factor modification. Patient with resultant RUE weakness with word finding deficits and CIR was recommended for follow up therapy.    Hospital Course: [name] was admitted to rehab 09/25/2014 for inpatient therapies to consist of PT, ST and OT at least three hours five days a week. Past admission physiatrist, therapy team and  rehab RN have worked together to provide customized collaborative inpatient rehab. Blood pressures have been monitored on tid basis and have been reasonably controlled on Norvasc and metoprolol.  X rays of bilateral knees revealed severe bilateral and symmetric medial compartment OA and patient advised to follow up with ortho in 6 months. Bilateral knee pain has been managed with use of Voltaren gel as well as tylenol prn. He was fitted with bilateral knee braces for varus abnormality and has helped with pain management as well as  gait quality. He is has shown good motivation and has made steady progress. He is modified independent for mobility and requires supervision due to cognitive needs. He will continue to receive follow up HHPT and [place] by [place] past discharge.    Rehab course: During patient's stay in rehab weekly team conferences were held to monitor patient's progress, set goals and discuss barriers to discharge. At admission, patient required moderate assistance with ADL tasks and min to moderate assistance with activity. He has had improvement in activity tolerance, balance, postural control, as well as ability to compensate for deficits. He is has had improvement in functional use RUE  and RLE as well as improved awareness. He is modified independent for bathing and dressing but requires supervision with shower transfers. He is modified independent for transfers and is able to ambulate 200 feet with RW.  He requires supervision for functional problem solving as well as use of external aids for recall and word finding  deficits. Family education was done with sister who will provide 24 hours supervision past discharge.    Disposition: 62-Rehab Facility  Diet: Heart Healthy.   Special Instructions: 1. Needs supervision.     Medication List    STOP taking these medications        OVER THE COUNTER MEDICATION      TAKE these medications        acetaminophen 325 MG tablet   Commonly known as:  TYLENOL  Take 1-2 tablets (325-650 mg total) by mouth every 4 (four) hours as needed for mild pain.     amLODipine 10 MG tablet  Commonly known as:  NORVASC  Take 1 tablet (10 mg total) by mouth daily with breakfast.     aspirin 325 MG tablet  Take 1 tablet (325 mg total) by mouth daily after breakfast.     atorvastatin 10 MG tablet  Commonly known as:  LIPITOR  Take 1 tablet (10 mg total) by mouth daily at 6 PM.     cycloSPORINE 0.05 % ophthalmic emulsion  Commonly known as:  RESTASIS  Place 1 drop into both eyes 2 (two) times daily.     diclofenac sodium 1 % Gel  Commonly known as:  VOLTAREN  Apply 2 g topically 4 (four) times daily.     lisinopril 10 MG tablet  Commonly known as:  PRINIVIL,ZESTRIL  Take 1 tablet (10 mg total) by mouth daily with breakfast. For blood pressure     metoprolol 50 MG tablet  Commonly known as:  LOPRESSOR  Take 1 tablet (50 mg total) by mouth 2 (two) times daily.     ONE-A-DAY MENS 50+ ADVANTAGE PO  Take 1 tablet by mouth daily.           Follow-up Information    Follow up with Charlett Blake, MD On 11/18/2014.   Specialty:  Physical Medicine and Rehabilitation   Why:  Be there at 11:15 am  for 11:30 am appointment    Contact information:   [address] [place] [address] [phone]       Follow up with Antony Contras, MD Today.   Specialties:  Neurology, Radiology   Why:  for follow up appointment in 4-6 weeks   Contact information:   [place] [address] [phone]       Follow up with Leola Brazil, MD On 10/07/2014.   Specialty:  Pulmonary Disease   Why:  @ 4:00 pm (arrive 3:45 pm)   Contact information:   [place] [address] [phone]       Signed: Bary Leriche 10/03/2014, 11:19 AM

## 2014-10-03 NOTE — Progress Notes (Signed)
64 y.o. 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 UDS positive for THC. 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 Carotid dopplers without significant Work up ongoing and neurology recommends ASA for secondary stroke prevention. 2D echo done revealing EF 60-65% with moderate LVH and grade 1 diastolic dysfunction. Dr. Leonie Man felt that patient with left subcortical infarct secondary to small vessel disease   Subjective/Complaints: No issues overnite  Aware of D/C has questions about meds   Review of Systems - Negative except knee pain, denies bowel or bladder issues   Objective: Vital Signs: Blood pressure 155/85, pulse 56, temperature 97.9 F (36.6 C), temperature source Oral, resp. rate 17, height '5\' 10"'  (1.778 m), weight 72.802 kg (160 lb 8 oz), SpO2 100 %. No results found. Results for orders placed or performed during the hospital encounter of 09/25/14 (from the past 72 hour(s))  Creatinine, serum     Status: Abnormal   Collection Time: 10/02/14  6:41 AM  Result Value Ref Range   Creatinine, Ser 1.01 0.50 - 1.35 mg/dL   GFR calc non Af Amer 76 (L) >90 mL/min   GFR calc Af Amer 88 (L) >90 mL/min    Comment: (NOTE) The eGFR has been calculated using the CKD EPI equation. This calculation has not been validated in all clinical situations. eGFR's persistently <90 mL/min signify possible Chronic Kidney Disease.      HEENT: normal Cardio: RRR Resp: CTA B/L GI: BS positive Extremity:  No Edema Skin:   Intact Neuro: Alert/Oriented, Flat, Normal Sensory, Abnormal Motor 4/5 Right delt, bi, tri, grip, HF, KN ankle DF and Abnormal FMC Ataxic/ dec  FMC Musc/Skel:  Other varus deformity Left knee with Left knee effusion Gen NAD   Assessment/Plan: 1. Functional deficits secondary to Left basal ganglia infarct which require 3+ hours per day of interdisciplinary therapy in a comprehensive inpatient rehab setting. Physiatrist is providing close team supervision and 24 hour management of active medical problems listed below. Physiatrist and rehab team continue to assess barriers to discharge/monitor patient progress toward functional and medical goals.  FIM: FIM - Bathing Bathing Steps Patient Completed: Chest, Right lower leg (including foot), Right Arm, Left lower leg (including foot), Left Arm, Front perineal area, Buttocks, Right upper leg, Left upper leg, Abdomen Bathing: 6: More than reasonable amount of time  FIM - Upper Body Dressing/Undressing Upper body dressing/undressing steps patient completed: Thread/unthread right sleeve of pullover shirt/dresss, Thread/unthread left sleeve of pullover shirt/dress, Put head through opening of pull over shirt/dress, Pull shirt over trunk Upper body dressing/undressing: 6: More than reasonable amount of time FIM - Lower Body Dressing/Undressing Lower body dressing/undressing steps patient completed: Thread/unthread right underwear leg, Thread/unthread left underwear leg, Pull underwear up/down, Pull pants up/down, Don/Doff right sock, Don/Doff left sock, Thread/unthread left pants leg, Thread/unthread right pants leg Lower body dressing/undressing: 5: Set-up assist to: Don/Doff AFO/prosthesis/orthosis  FIM - Toileting Toileting steps completed by patient: Adjust clothing prior to toileting, Performs perineal hygiene, Adjust clothing after toileting Toileting Assistive Devices: Grab bar or rail for support Toileting: 6: More than reasonable amount of time  FIM - Radio producer Devices: Bedside commode, Insurance account manager Transfers: 5-From toilet/BSC: Supervision  (verbal cues/safety  issues), 6-From toilet/BSC, 6-More than reasonable amt of time  FIM - Control and instrumentation engineer Devices: Walker, Arm rests Bed/Chair Transfer: 6: Supine > Sit: No assist, 6: Sit > Supine: No assist, 6: Chair or W/C > Bed: No assist, 6: Bed > Chair or W/C: No assist  FIM - Locomotion: Wheelchair Distance: 30 Locomotion: Wheelchair: 0: Activity did not occur FIM - Locomotion: Ambulation Locomotion: Ambulation Assistive Devices: Orthosis, Environmental consultant - Rolling (bilateral knee braces) Ambulation/Gait Assistance: 6: Modified independent (Device/Increase time) Locomotion: Ambulation: 6: Travels 150 ft or more with assistive device/no helper  Comprehension Comprehension Mode: Auditory Comprehension: 6-Follows complex conversation/direction: With extra time/assistive device  Expression Expression Mode: Verbal Expression: 5-Expresses complex 90% of the time/cues < 10% of the time  Social Interaction Social Interaction: 6-Interacts appropriately with others with medication or extra time (anti-anxiety, antidepressant).  Problem Solving Problem Solving: 5-Solves basic 90% of the time/requires cueing < 10% of the time  Memory Memory: 5-Recognizes or recalls 90% of the time/requires cueing < 10% of the time   Medical Problem List and Plan: 1. Functional deficits secondary to Left basal ganglia infarct.   2.  DVT Prophylaxis/Anticoagulation: Pharmaceutical: Lovenox 3. Pain Management: continue to use knee braces. Ice and Tylenol prn for bilateral knee pain.Diclofenac gel - bilateral knee braces to control varus better and to decrease pain, voltaren gel Pt with severe medial compartment OA- rec ortho surgery eval in ~49mo4. Mood: No signs of distress noted. LCSW to follow for evaluation and support.   5. Neuropsych: This patient is is  capable of making decisions on his own behalf. 6. Skin/Wound Care: Encourage pressure relief measures. Mobility. Maintain  adequate nutritional and hydration status.   7. Fluids/Electrolytes/Nutrition: Monitor I/O. Check lytes in am.   8. Elevated LFTs: Improving--likely reactive. Hepatitis panel negative. AST min elevation down to 50. Abdominal Ultrasound negative.   9. HTN: Will monitor tid. Continue Norvasc and metoprolol D/C HR paramenters as pt has been asymptomatic in 50s and holding metoprolol has resulted in BP elevation. Educate patient on importance on medication adherence.   LOS (Days) 8 A FACE TO FACE EVALUATION WAS PERFORMED  Onica Davidovich E 10/03/2014, 6:26 AM

## 2014-10-03 NOTE — Discharge Instructions (Signed)
Inpatient Rehab Discharge Instructions  [name] Discharge date and time:  10/03/14  Activities/Precautions/ Functional Status: Activity: activity as tolerated Diet: cardiac diet Wound Care: none needed   Functional status:  ___ No restrictions     ___ Walk up steps independently _X__ 24/7 supervision/assistance   ___ Walk up steps with assistance ___ Intermittent supervision/assistance  ___ Bathe/dress independently ___ Walk with walker     ___ Bathe/dress with assistance ___ Walk Independently    ___ Shower independently ___ Walk with assistance    ___ Shower with assistance _X__ No alcohol     ___ Return to work/school ________     COMMUNITY REFERRALS UPON DISCHARGE:    Home Health:   PT     OT                      Agency:  [facility] Phone: [phone]   Medical Equipment/Items Ordered:  Rolling walker, commode and tub bench                                                      Agency/Supplier: [facility] @ [phone]       Special Instructions:    STROKE/TIA DISCHARGE INSTRUCTIONS SMOKING Cigarette smoking nearly doubles your risk of having a stroke & is the single most alterable risk factor  If you smoke or have smoked in the last 12 months, you are advised to quit smoking for your health.  Most of the excess cardiovascular risk related to smoking disappears within a year of stopping.  Ask you doctor about anti-smoking medications  [facility] Quit Line: 1-800-QUIT NOW  Free Smoking Cessation Classes [phone]  CHOLESTEROL Know your levels; limit fat & cholesterol in your diet  Lipid Panel     Component Value Date/Time   CHOL 127 09/22/2014 0710   TRIG 55 09/22/2014 0710   HDL 36* 09/22/2014 0710   CHOLHDL 3.5 09/22/2014 0710   VLDL 11 09/22/2014 0710   [place] 80 09/22/2014 0710      Many patients benefit from treatment even if their cholesterol is at goal.  Goal: Total Cholesterol (CHOL) less than 160  Goal:  Triglycerides (TRIG)  less than 150  Goal:  HDL greater than 40  Goal:  LDL (LDLCALC) less than 100   BLOOD PRESSURE American Stroke Association blood pressure target is less that 120/80 mm/Hg  Your discharge blood pressure is:  BP: 135/71 mmHg  Monitor your blood pressure  Limit your salt and alcohol intake  Many individuals will require more than one medication for high blood pressure  DIABETES (A1c is a blood sugar average for last 3 months) Goal HGBA1c is under 7% (HBGA1c is blood sugar average for last 3 months)  Diabetes: No known diagnosis of diabetes    Lab Results  Component Value Date   HGBA1C 5.5 09/22/2014     Your HGBA1c can be lowered with medications, healthy diet, and exercise.  Check your blood sugar as directed by your physician  Call your physician if you experience unexplained or low blood sugars.  PHYSICAL ACTIVITY/REHABILITATION Goal is 30 minutes at least 4 days per week  Activity: No driving, Therapies:  See above Return to work:  N/A  Activity decreases your risk of heart attack and stroke and makes your heart stronger.  It helps control your weight and blood pressure; helps you relax and can improve your mood.  Participate in a regular exercise program.  Talk with your doctor about the best form of exercise for you (dancing, walking, swimming, cycling).  DIET/WEIGHT Goal is to maintain a healthy weight  Your discharge diet is: Diet Heart thin liquids Your height is:  Height: 5\' 10"  (177.8 cm) Your current weight is: Weight: 73.029 kg (161 lb) Your Body Mass Index (BMI) is:  BMI (Calculated): 23.1  Following the type of diet specifically designed for you will help prevent another stroke.  You are at goal weight    Your goal Body Mass Index (BMI) is 19-24.  Healthy food habits can help reduce 3 risk factors for stroke:  High cholesterol, hypertension, and excess weight.  RESOURCES Stroke/Support Group:  Call [phone]   STROKE EDUCATION PROVIDED/REVIEWED AND  GIVEN TO PATIENT Stroke warning signs and symptoms How to activate emergency medical system (call 911). Medications prescribed at discharge. Need for follow-up after discharge. Personal risk factors for stroke. Pneumonia vaccine given:  Flu vaccine given:  My questions have been answered, the writing is legible, and I understand these instructions.  I will adhere to these goals & educational materials that have been provided to me after my discharge from the hospital.      My questions have been answered and I understand these instructions. I will adhere to these goals and the provided educational materials after my discharge from the hospital.  Patient/Caregiver Signature _______________________________ Date __________  Clinician Signature _______________________________________ Date __________  Please bring this form and your medication list with you to all your follow-up doctor's appointments.

## 2014-10-03 NOTE — Progress Notes (Signed)
Social Work  Discharge Note  The overall goal for the admission was met for:   Discharge location: Yes- home with sister  Length of Stay: Yes - 8 days  Discharge activity level: Yes  - mod i to supervision  Home/community participation: Yes  Services provided included: MD, RD, PT, OT, SLP, RN, TR, Pharmacy and [place]: Medicare and Medicaid  Follow-up services arranged: Home Health: PT, OT via [facility], DME: rolling walker, 3n1 commode and tub bench via [facility] and Patient/Family has no preference for HH/DME agencies  Comments (or additional information):  Patient/Family verbalized understanding of follow-up arrangements: Yes  Individual responsible for coordination of the follow-up plan: patient/ sister  Confirmed correct DME delivered: Lennart Pall 10/03/2014    Kolton Kienle

## 2014-11-01 ENCOUNTER — Telehealth: Payer: Self-pay | Admitting: *Deleted

## 2014-11-01 NOTE — Telephone Encounter (Signed)
[facility] calling to request VO for RN visit to assist pt and family with questions about medications and diet teaching.  VO approval given.

## 2014-11-18 ENCOUNTER — Encounter: Payer: Self-pay | Admitting: Physical Medicine & Rehabilitation

## 2014-11-18 ENCOUNTER — Encounter: Payer: Medicare HMO | Attending: Physical Medicine & Rehabilitation

## 2014-11-18 ENCOUNTER — Ambulatory Visit (HOSPITAL_BASED_OUTPATIENT_CLINIC_OR_DEPARTMENT_OTHER): Payer: Medicare HMO | Admitting: Physical Medicine & Rehabilitation

## 2014-11-18 VITALS — BP 137/78 | HR 56 | Resp 14

## 2014-11-18 DIAGNOSIS — I6381 Other cerebral infarction due to occlusion or stenosis of small artery: Secondary | ICD-10-CM

## 2014-11-18 DIAGNOSIS — G8191 Hemiplegia, unspecified affecting right dominant side: Secondary | ICD-10-CM

## 2014-11-18 DIAGNOSIS — G8190 Hemiplegia, unspecified affecting unspecified side: Secondary | ICD-10-CM

## 2014-11-18 DIAGNOSIS — I639 Cerebral infarction, unspecified: Secondary | ICD-10-CM | POA: Diagnosis not present

## 2014-11-18 DIAGNOSIS — M174 Other bilateral secondary osteoarthritis of knee: Secondary | ICD-10-CM

## 2014-11-18 NOTE — Patient Instructions (Signed)
Joint Injection  Care After  Refer to this sheet in the next few days. These instructions provide you with information on caring for yourself after you have had a joint injection. Your caregiver also may give you more specific instructions. Your treatment has been planned according to current medical practices, but problems sometimes occur. Call your caregiver if you have any problems or questions after your procedure.  After any type of joint injection, it is not uncommon to experience:  · Soreness, swelling, or bruising around the injection site.  · Mild numbness, tingling, or weakness around the injection site caused by the numbing medicine used before or with the injection.  It also is possible to experience the following effects associated with the specific agent after injection:  · Iodine-based contrast agents:  ¨ Allergic reaction (itching, hives, widespread redness, and swelling beyond the injection site).  · Corticosteroids (These effects are rare.):  ¨ Allergic reaction.  ¨ Increased blood sugar levels (If you have diabetes and you notice that your blood sugar levels have increased, notify your caregiver).  ¨ Increased blood pressure levels.  ¨ Mood swings.  · Hyaluronic acid in the use of viscosupplementation.  ¨ Temporary heat or redness.  ¨ Temporary rash and itching.  ¨ Increased fluid accumulation in the injected joint.  These effects all should resolve within a day after your procedure.   HOME CARE INSTRUCTIONS  · Limit yourself to light activity the day of your procedure. Avoid lifting heavy objects, bending, stooping, or twisting.  · Take prescription or over-the-counter pain medication as directed by your caregiver.  · You may apply ice to your injection site to reduce pain and swelling the day of your procedure. Ice may be applied 03-04 times:  ¨ Put ice in a plastic bag.  ¨ Place a towel between your skin and the bag.  ¨ Leave the ice on for no longer than 15-20 minutes each time.  SEEK  IMMEDIATE MEDICAL CARE IF:   · Pain and swelling get worse rather than better or extend beyond the injection site.  · Numbness does not go away.  · Blood or fluid continues to leak from the injection site.  · You have chest pain.  · You have swelling of your face or tongue.  · You have trouble breathing or you become dizzy.  · You develop a fever, chills, or severe tenderness at the injection site that last longer than 1 day.  MAKE SURE YOU:  · Understand these instructions.  · Watch your condition.  · Get help right away if you are not doing well or if you get worse.  Document Released: 04/01/2011 Document Revised: 10/11/2011 Document Reviewed: 04/01/2011  ExitCare® Patient Information ©2015 [employer]. This information is not intended to replace advice given to you by your health care provider. Make sure you discuss any questions you have with your health care provider.

## 2014-11-18 NOTE — Progress Notes (Signed)
Subjective:    Patient ID: [name], male    DOB: 13-Jun-1951, 64 y.o.   MRN: [id number] 64 y.o. 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 UDS positive for THC. 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 Neurology recommended ASA for  left subcortical infarct secondary to small vessel disease and recommended aggressive risk factor modification Admit date: 09/25/2014 Discharge date: 10/03/2014  HPI HHPT, OT, Completed Now Mod I ADL, Mobility.  Use walker to ambulate in home Mod I, need sup on steps (15steps)  Also walked to [facility] 1 block away Still with some Right arm weakness Here with his sister, he does not have a transportation, does not drive. Sister is pursuing handicapped transportation. Patient is not back to his baseline in regards to his right upper extremity as well as his ambulation. He is using bilateral knee braces to ambulate but feels they are quite heavy  He is interested in further therapy since home health has finished  Pain Inventory Average Pain 5 Pain Right Now 5 My pain is aching  In the last 24 hours, has pain interfered with the following? General activity 5 Relation with others 5 Enjoyment of life 5 What TIME of day is your pain at its worst? morning Sleep (in general) Fair  Pain is worse with: walking, bending, inactivity, standing and some activites Pain improves with: heat/ice and therapy/exercise Relief from Meds: no relief from medications  Mobility walk with assistance use a walker ability to climb steps?  yes do you drive?  no needs help with transfers  Function not employed: date last employed  . disabled: date disabled . I need assistance with the following:  meal prep and shopping  Neuro/Psych bladder control problems weakness numbness trouble walking confusion anxiety  Prior Studies hospital f/u  Physicians involved in your care hospital f/u   Family History  Problem Relation Age of Onset  . Diabetes Mellitus II Mother   . Stroke Mother   . Cancer Mother   . Stuttering     History   Social History  . Marital Status: Single    Spouse Name: N/A  . Number of Children: N/A  . Years of Education: N/A   Social History Main Topics  . Smoking status: Former Smoker    Types: Cigarettes    Quit date: 09/26/1983  . Smokeless tobacco: Not on file  . Alcohol Use: No  . Drug Use: Not on file  . Sexual Activity: Not on file   Other Topics Concern  . None   Social History Narrative   Past Surgical History  Procedure Laterality Date  . No past surgeries     Past Medical History  Diagnosis Date  . Knee injury     right knee injury   . Hypertension   . Glaucoma   . Cataracts, bilateral    BP 137/78 mmHg  Pulse 56  Resp 14  SpO2 100%  Opioid Risk Score:   Fall Risk Score:  `1  Depression screen PHQ 2/9  No flowsheet data found.   Review of Systems  Constitutional:       Weight loss  Respiratory: Positive for cough, shortness of breath and wheezing.  Endocrine:       High blood sugar  Genitourinary: Positive for urgency and frequency.  Musculoskeletal: Positive for gait problem.  Neurological: Positive for weakness and numbness.  Psychiatric/Behavioral: Positive for confusion. The patient is nervous/anxious.   All other systems reviewed and are negative.      Objective:   Physical Exam  Constitutional: He is oriented to person, place, and time. He appears well-developed and well-nourished.  Neurological: He is alert and oriented to person, place, and time.  Psychiatric: He has a normal mood and affect.  Nursing note and vitals  reviewed.  Motor strength is 4/5 in the right deltoid, bicep, tricep, grip 5/5 in the left deltoid, biceps, triceps, grip 4/5 bilateral hip flexion the extensor ankle dorsal flexor plantar flexion Bilateral knee orthoses Bilateral valgus deformities at the knees Mild right knee effusion  Sensation intact bilateral upper limbs     Assessment & Plan:  1. 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 Overall improved however still has some residual upper extremity graded lower extreme and weakness. He would benefit from further therapy, will make an order for outpatient PT and OT, goals would be to upgrade to a least restricted device. He is currently using a walker but needs go up and down steps with this which is very inconvenient. Hopefully he get safe with a cane  Discussed this with the patient and his sister, will also fill out a handicapped transportation forms  2. Bilateral knee osteoarthritis mainly medial compartment. Not a good candidate for Synvisc secondary to degree. He may benefit from cortisone injection but ultimately may need surgical referral  Knee injection Right  Indication:Right Knee pain not relieved by medication management and other conservative care.  Informed consent was obtained after describing risks and benefits of the procedure with the patient, this includes bleeding, bruising, infection and medication side effects. The patient wishes to proceed and has given written consent. The patient was placed in a recumbent position. The medial aspect of the knee was marked and prepped with Betadine and alcohol. It was then entered with a 30-gauge 1/2 inch needle and 1 mL of 1% lidocaine was injected into the skin and subcutaneous tissue. Then another 25g 1.5inchneedle was inserted into the knee joint. After negative draw back for blood, a solution containing one ML of 6mg  per mL betamethasone and 3 mL of 1% lidocaine were injected. The patient tolerated the procedure  well. Post procedure instructions were given.

## 2014-12-02 ENCOUNTER — Encounter: Payer: Self-pay | Admitting: Neurology

## 2014-12-02 ENCOUNTER — Ambulatory Visit (INDEPENDENT_AMBULATORY_CARE_PROVIDER_SITE_OTHER): Payer: Medicare HMO | Admitting: Neurology

## 2014-12-02 VITALS — BP 145/85 | HR 74 | Ht 67.0 in | Wt 164.8 lb

## 2014-12-02 DIAGNOSIS — E785 Hyperlipidemia, unspecified: Secondary | ICD-10-CM | POA: Diagnosis not present

## 2014-12-02 DIAGNOSIS — I63312 Cerebral infarction due to thrombosis of left middle cerebral artery: Secondary | ICD-10-CM | POA: Diagnosis not present

## 2014-12-02 DIAGNOSIS — I10 Essential (primary) hypertension: Secondary | ICD-10-CM | POA: Diagnosis not present

## 2014-12-02 DIAGNOSIS — R002 Palpitations: Secondary | ICD-10-CM

## 2014-12-02 DIAGNOSIS — I499 Cardiac arrhythmia, unspecified: Secondary | ICD-10-CM | POA: Insufficient documentation

## 2014-12-02 NOTE — Progress Notes (Signed)
STROKE NEUROLOGY FOLLOW UP NOTE  NAME: [name] DOB: Jul 21, 1951  REASON FOR VISIT: stroke follow up HISTORY FROM: wife  Today we had the pleasure of seeing [name] in follow-up at our Neurology Clinic. Pt was accompanied by chart and wife and pt.   History Summary Franklyn Cafaro is an 64 y.o. male with a history of HTN, knee injury and arthritis was admitted on 09/24/14 for right side weakness for 10 days. 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 Tiny remote left pontine and left basal ganglia infarctions were also noted. Stroke work up negative including MRA, 2D echo and CUS as well as A1C. Only LDL 80. He was put on ASA 325mg  and lipitor 10mg  and d/c to CIR.   Interval History During the interval time, the patient has been doing well. Had CIR for 7 days and then home PT/OT for a month. Right side weakness much better but still not back to baseline yet. Has to walk with walker now and before with cane. He can not write as good as before. His BP 145/85 and he is on amlodipine, metoprolol, lisinopril.   REVIEW OF SYSTEMS: Full 14 system review of systems performed and notable only for those listed below and in HPI above, all others are negative:  Constitutional:  Fatigue  Cardiovascular:  Ear/Nose/Throat:   Skin:  Eyes:  Eye itching, eye redness Respiratory:   Gastroitestinal:   Genitourinary:  Hematology/Lymphatic:   Endocrine:  Musculoskeletal:  Joint pain, aching muscle, muscle cramps, walking difficulty Allergy/Immunology:   Neurological:  Speech difficulty, weakness Psychiatric:  Sleep:   The following represents the patient's updated allergies and side effects list: No Known Allergies  The neurologically relevant items on the patient's problem list were reviewed on today's visit.  Neurologic Examination  A problem focused neurological exam (12 or more points of the single system  neurologic examination, vital signs counts as 1 point, cranial nerves count for 8 points) was performed.  Blood pressure 145/85, pulse 74, height 5\' 7"  (1.702 m), weight 164 lb 12.8 oz (74.753 kg).  General - Well nourished, well developed, in no apparent distress.  Ophthalmologic - Sharp disc margins OU.  Cardiovascular - irregular heart rhythm, likely premature beat, but not able to rule out afib.  Mental Status -  Level of arousal and orientation to time, place, and person were intact. Language including expression, naming, repetition, comprehension was assessed and found intact. Fund of Knowledge was assessed and was intact.  Cranial Nerves II - XII - II - Visual field intact OU. III, IV, VI - Extraocular movements intact. V - Facial sensation intact bilaterally. VII - Facial movement intact bilaterally. VIII - Hearing & vestibular intact bilaterally. X - Palate elevates symmetrically. XI - Chin turning & shoulder shrug intact bilaterally. XII - Tongue protrusion intact.  Motor Strength - The patient's strength was normal in all extremities and pronator drift was absent except right hand decreased dexterity.  Bulk was normal and fasciculations were absent.   Motor Tone - Muscle tone was assessed at the neck and appendages and was normal.  Reflexes - The patient's reflexes were normal in all extremities and he had no pathological reflexes.  Sensory - Light touch, temperature/pinprick were assessed and were normal.    Coordination - The patient had normal movements in the hands with no ataxia or dysmetria, but right FTN slower than left.  Tremor was absent.  Gait and Station -  walk with walker, pt has deformity of both knees, bow-shaped knees on walking, walk slow and small stride.  Data reviewed: I personally reviewed the images and agree with the radiology interpretations.  MRI brain 1. Subacute ischemic infarct involving the superior left basal ganglia with cephalad  extension into the periventricular white matter of the left corona radiata. No associated hemorrhage or significant mass effect. 2. Tiny remote left pontine and left basal ganglia lacunar infarcts. 3. Atrophy with moderate chronic microvascular ischemic disease.  MRA Normal intracranial MRA without evidence of hemodynamically significant stenosis or proximal branch occlusion.   Carotid Doppler 1-39% ICA stenosis. Vertebral artery flow is antegrade.  2D Echo EF 65% no clot, no shunt  Component     Latest Ref Rng 09/22/2014  Cholesterol     0 - 200 mg/dL 127  Triglycerides     <150 mg/dL 55  HDL Cholesterol     >39 mg/dL 36 (L)  Total CHOL/HDL Ratio      3.5  VLDL     0 - 40 mg/dL 11  LDL (calc)     0 - 99 mg/dL 80  Hemoglobin A1C     4.8 - 5.6 % 5.5  Mean Plasma Glucose      111  TSH     0.350 - 4.500 uIU/mL 0.785  Vitamin B-12     211 - 911 pg/mL >2000 (H)    Assessment: As you may recall, he is a 64 y.o. African American male with PMH of HTN, knee arthritis with bow-shaped knees was admitted on 09/22/14 for left BG and CR infarct on MRI. The size of infarct is larger than typical lacunar stroke. Stroke work up all negative including MRA, CUS, 2D echo and A1C. Only has LDL 80. Stroke risk factor include HTN and slightly high LDL. On ASA and lipitor. BP 145/85 under 3 BP meds. His cardiac exam showed irregular heart rhythm likely premature beat but afib not able to rule out. Will do 30 day cardiac event monitoring.  Plan:  - continue ASA and lipitor for stroke prevention - continue BP meds for better BP control - check BP at home and record and bring over to PCP for medication adjustment. - will do 30 day cardiac monitoring due to irregular heart beat - Follow up with your primary care physician for stroke risk factor modification. Recommend maintain blood pressure goal <130/80, diabetes with hemoglobin A1c goal below 6.5% and lipids with LDL cholesterol goal below 70 mg/dL.    - follow up with PCP for polyuria.  - RTC in 2 months  Orders Placed This Encounter  Procedures  . Cardiac event monitor    Standing Status: Future     Number of Occurrences:      Standing Expiration Date: 12/03/2015    Scheduling Instructions:     Request cardionet setup. Thank you.    Order Specific Question:  Where should this test be performed    Answer:  [facility]    Meds ordered this encounter  Medications  . DISCONTD: chlorthalidone (HYGROTON) 25 MG tablet    Sig: Take 25 mg by mouth daily.  . VOLTAREN 1 % GEL    Sig:     Refill:  2  . acetaminophen (TYLENOL) 325 MG tablet    Sig: Take 650 mg by mouth every 6 (six) hours as needed.  [name] amLODipine (NORVASC) 10 MG tablet    Sig: Take 10 mg by mouth daily.  [name] aspirin 325 MG  tablet    Sig: Take 325 mg by mouth daily.  [name] atorvastatin (LIPITOR) 10 MG tablet    Sig: Take 10 mg by mouth daily.  . cycloSPORINE (RESTASIS) 0.05 % ophthalmic emulsion    Sig: 1 drop 2 (two) times daily.  [name] lisinopril (PRINIVIL,ZESTRIL) 10 MG tablet    Sig: Take 10 mg by mouth daily.  . metoprolol (LOPRESSOR) 50 MG tablet    Sig: Take 50 mg by mouth 2 (two) times daily.  . Multiple Vitamins-Minerals (MULTIVITAMIN WITH MINERALS) tablet    Sig: Take 1 tablet by mouth daily.    Patient Instructions  - continue ASA and lipitor for stroke prevention - continue BP meds for better BP control - check BP at home and record and bring over to PCP for medication adjustment. - will do 30 day cardiac monitoring due to irregular heart beat - Follow up with your primary care physician for stroke risk factor modification. Recommend maintain blood pressure goal <130/80, diabetes with hemoglobin A1c goal below 6.5% and lipids with LDL cholesterol goal below 70 mg/dL.  - follow up with PCP for polyuria.  - regular exercise and healthy diet. - follow up in 2 months    Rosalin Hawking, MD PhD [facility] [address], [place], [place] [address] [phone]

## 2014-12-02 NOTE — Patient Instructions (Signed)
-   continue ASA and lipitor for stroke prevention - continue BP meds for better BP control - check BP at home and record and bring over to PCP for medication adjustment. - will do 30 day cardiac monitoring due to irregular heart beat - Follow up with your primary care physician for stroke risk factor modification. Recommend maintain blood pressure goal <130/80, diabetes with hemoglobin A1c goal below 6.5% and lipids with LDL cholesterol goal below 70 mg/dL.  - follow up with PCP for polyuria.  - regular exercise and healthy diet. - follow up in 2 months

## 2014-12-10 ENCOUNTER — Encounter: Payer: Self-pay | Admitting: *Deleted

## 2014-12-10 ENCOUNTER — Ambulatory Visit (INDEPENDENT_AMBULATORY_CARE_PROVIDER_SITE_OTHER): Payer: Medicare HMO

## 2014-12-10 DIAGNOSIS — R002 Palpitations: Secondary | ICD-10-CM

## 2014-12-10 NOTE — Progress Notes (Signed)
Patient ID: [name], male   DOB: Mar 25, 1951, 64 y.o.   MRN: [id number] [employer] 30 day cardiac event monitor applied to patient.

## 2014-12-19 ENCOUNTER — Encounter: Payer: Self-pay | Admitting: Physical Medicine & Rehabilitation

## 2014-12-19 ENCOUNTER — Encounter: Payer: Medicare HMO | Attending: Physical Medicine & Rehabilitation

## 2014-12-19 ENCOUNTER — Ambulatory Visit (HOSPITAL_BASED_OUTPATIENT_CLINIC_OR_DEPARTMENT_OTHER): Payer: Medicare HMO | Admitting: Physical Medicine & Rehabilitation

## 2014-12-19 VITALS — BP 131/81 | HR 64 | Resp 14

## 2014-12-19 DIAGNOSIS — M174 Other bilateral secondary osteoarthritis of knee: Secondary | ICD-10-CM

## 2014-12-19 DIAGNOSIS — I639 Cerebral infarction, unspecified: Secondary | ICD-10-CM | POA: Insufficient documentation

## 2014-12-19 DIAGNOSIS — G8190 Hemiplegia, unspecified affecting unspecified side: Secondary | ICD-10-CM | POA: Diagnosis not present

## 2014-12-19 NOTE — Patient Instructions (Signed)
Joint Injection  Care After  Refer to this sheet in the next few days. These instructions provide you with information on caring for yourself after you have had a joint injection. Your caregiver also may give you more specific instructions. Your treatment has been planned according to current medical practices, but problems sometimes occur. Call your caregiver if you have any problems or questions after your procedure.  After any type of joint injection, it is not uncommon to experience:  · Soreness, swelling, or bruising around the injection site.  · Mild numbness, tingling, or weakness around the injection site caused by the numbing medicine used before or with the injection.  It also is possible to experience the following effects associated with the specific agent after injection:  · Iodine-based contrast agents:  ¨ Allergic reaction (itching, hives, widespread redness, and swelling beyond the injection site).  · Corticosteroids (These effects are rare.):  ¨ Allergic reaction.  ¨ Increased blood sugar levels (If you have diabetes and you notice that your blood sugar levels have increased, notify your caregiver).  ¨ Increased blood pressure levels.  ¨ Mood swings.  · Hyaluronic acid in the use of viscosupplementation.  ¨ Temporary heat or redness.  ¨ Temporary rash and itching.  ¨ Increased fluid accumulation in the injected joint.  These effects all should resolve within a day after your procedure.   HOME CARE INSTRUCTIONS  · Limit yourself to light activity the day of your procedure. Avoid lifting heavy objects, bending, stooping, or twisting.  · Take prescription or over-the-counter pain medication as directed by your caregiver.  · You may apply ice to your injection site to reduce pain and swelling the day of your procedure. Ice may be applied 03-04 times:  ¨ Put ice in a plastic bag.  ¨ Place a towel between your skin and the bag.  ¨ Leave the ice on for no longer than 15-20 minutes each time.  SEEK  IMMEDIATE MEDICAL CARE IF:   · Pain and swelling get worse rather than better or extend beyond the injection site.  · Numbness does not go away.  · Blood or fluid continues to leak from the injection site.  · You have chest pain.  · You have swelling of your face or tongue.  · You have trouble breathing or you become dizzy.  · You develop a fever, chills, or severe tenderness at the injection site that last longer than 1 day.  MAKE SURE YOU:  · Understand these instructions.  · Watch your condition.  · Get help right away if you are not doing well or if you get worse.  Document Released: 04/01/2011 Document Revised: 10/11/2011 Document Reviewed: 04/01/2011  ExitCare® Patient Information ©2015 [employer]. This information is not intended to replace advice given to you by your health care provider. Make sure you discuss any questions you have with your health care provider.

## 2014-12-19 NOTE — Progress Notes (Signed)
Knee injection Left suprapatellar bursa Under ultrasound guidance  Indication:Left Knee pain not relieved by medication management and other conservative care.  Informed consent was obtained after describing risks and benefits of the procedure with the patient, this includes bleeding, bruising, infection and medication side effects. The patient wishes to proceed and has given written consent. The patient was placed in a recumbent position. The lateral aspect of the suprapatellar area was marked and prepped with Betadine and alcohol. It was then entered with a 25-gauge 1-1/2 inch needle and 1 mL of 1% lidocaine was injected into the skin and subcutaneous tissue. The needle was guided under direct ultrasound  Visualization, long axis into the suprapatellar bursa. 1 cc of lidocaine 1% expanded the bursa. After negative draw back for blood, a solution containing one ML of 6mg  per mL betamethasone and 3 mL of 1% lidocaine were injected. The patient tolerated the procedure well. Post procedure instructions were given.

## 2015-01-07 ENCOUNTER — Ambulatory Visit
Payer: Medicare HMO | Attending: Physical Medicine & Rehabilitation | Admitting: Rehabilitative and Restorative Service Providers"

## 2015-01-07 DIAGNOSIS — R269 Unspecified abnormalities of gait and mobility: Secondary | ICD-10-CM

## 2015-01-07 DIAGNOSIS — G8191 Hemiplegia, unspecified affecting right dominant side: Secondary | ICD-10-CM

## 2015-01-07 DIAGNOSIS — G8190 Hemiplegia, unspecified affecting unspecified side: Secondary | ICD-10-CM | POA: Insufficient documentation

## 2015-01-07 DIAGNOSIS — M25562 Pain in left knee: Secondary | ICD-10-CM | POA: Insufficient documentation

## 2015-01-07 DIAGNOSIS — R2689 Other abnormalities of gait and mobility: Secondary | ICD-10-CM

## 2015-01-07 DIAGNOSIS — M25561 Pain in right knee: Secondary | ICD-10-CM | POA: Diagnosis not present

## 2015-01-07 NOTE — Therapy (Signed)
[facility] [address] [place], [place], [address] Phone: [phone]   Fax:  [phone]  Physical Therapy Evaluation  Patient Details  Name: [name] MRN: [id number] Date of Birth: [birth year] Referring Provider:  Charlett Blake, MD  Encounter Date: 01/07/2015      PT End of Session - 01/07/15 1508    Visit Number 1   Number of Visits 16   Date for PT Re-Evaluation 03/08/15   PT Start Time 0850   PT Stop Time 0930   PT Time Calculation (min) 40 min   Equipment Utilized During Treatment Gait belt   Activity Tolerance Patient tolerated treatment well   Behavior During Therapy [facility] for tasks assessed/performed      Past Medical History  Diagnosis Date  . Knee injury     right knee injury   . Hypertension   . Glaucoma   . Cataracts, bilateral   . Stroke   . Arthritis     Past Surgical History  Procedure Laterality Date  . Inguinal hernia repair Right 1988  . Knee surgery Left 1980's    x 4    There were no vitals filed for this visit.  Visit Diagnosis:  Abnormality of gait and mobility  Poor balance  Hemiparesis, right  Bilateral knee pain      Subjective Assessment - 01/07/15 0855    Subjective pt is a 64 y.o. male who was admitted to hospital on 09/24/14 for stroke. Per hospital record, MRI of brain revealed subacute ischemic infarct involving the superior left basal ganglia with cephalad extension into the periventricular white matter of the left corona radiata and atrophy with moderate chronic ischemic changes. pt reports he received inpatient rehabiliation at the hospital, as well as home health PT for about month after discharge from the hospital. pt reports right sided weakness/dysfunction which has equally effected his RUE and RLE.    Pertinent History bilateral OA knee pain and chronic low back pain; HTN   Patient Stated Goals pt reports he wishes to return to using his cane for  ambulation needs (he is currently using a RW)   Currently in Pain? Yes   Pain Score 5    Pain Location Knee   Pain Orientation Right;Left   Pain Type Chronic pain   Pain Onset More than a month ago   Pain Frequency Intermittent   Aggravating Factors  sitting still for long periods of time.    Pain Relieving Factors "once i get moving around"             [facility] PT Assessment - 01/07/15 0902    Assessment   Medical Diagnosis Basal ganglia infarction; right hemiparesis   Onset Date/Surgical Date 09/24/14   Hand Dominance Right   Next MD Visit June 21st with neurologist     Prior Therapy home health PT in march 2016   Precautions   Precautions Fall;ICD/Pacemaker   Required Braces or Orthoses Knee Immobilizer - Right;Knee Immobilizer - Left   Balance Screen   Has the patient fallen in the past 6 months Yes   How many times? 2   Has the patient had a decrease in activity level because of a fear of falling?  Yes   Is the patient reluctant to leave their home because of a fear of falling?  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   Cognition   Overall Cognitive Status Within Functional Limits for tasks assessed   Coordination   Fine Motor Movements are Fluid and Coordinated No   Finger Nose Finger Test limted: slow pace; somewhat inaccurate   Heel Shin Test limited: slow pace; somewhat inaccurate   Posture/Postural Control   Posture/Postural Control Postural limitations   Postural Limitations Rounded Shoulders;Forward head;Anterior pelvic tilt;Weight shift right   Posture Comments significant bilateral  genu varum   ROM / Strength   AROM / PROM / Strength Strength   Strength   Overall Strength Within functional limits for tasks performed   Overall Strength Comments strength grossly WFL for UE and LE   Flexibility   Soft Tissue Assessment /Muscle Length yes   Hamstrings limited knee extension bilaterally   Quadriceps --   Ambulation/Gait   Ambulation/Gait Yes   Ambulation/Gait Assistance 4: Min guard   Ambulation Distance (Feet) 80 Feet   Assistive device Rolling walker   Gait Pattern Step-to pattern;Decreased step length - right;Decreased step length - left;Decreased weight shift to right;Right flexed knee in stance;Left flexed knee in stance;Wide base of support  significant bilateral genu varum   Ambulation Surface Level;Indoor   Gait velocity 1.37 ft/sec with RW, 0.736 ft/sec with SPC   Gait Comments significant genu varum (L>R) with weightshifting to the R>L when loading through gait. pt also demo significant unsteadiness with turns      Standardized Balance Assessment   Standardized Balance Assessment Berg Balance Test   Berg Balance Test   Sit to Stand Able to stand  independently using hands   Standing Unsupported Able to stand 2 minutes with supervision   Sitting with Back Unsupported but Feet Supported on Floor or Stool Able to sit safely and securely 2 minutes   Stand to Sit Sits safely with minimal use of hands   Transfers Able to transfer safely, definite need of hands   Standing Unsupported with Eyes Closed Able to stand 10 seconds safely   Standing Ubsupported with Feet Together Able to place feet together independently and stand 1 minute safely   From Standing, Reach Forward with Outstretched Arm Can reach forward >12 cm safely (5")   From Standing Position, Pick up Object from Floor Able to pick up shoe safely and easily   From Standing Position, Turn to Look Behind Over each Shoulder Needs supervision when turning   Turn 360 Degrees Needs close supervision or verbal  cueing   Standing Unsupported, Alternately Place Feet on Step/Stool Needs assistance to keep from falling or unable to try   Standing Unsupported, One Foot in Front Able to plae foot ahead of the other independently and hold 30 seconds   Standing on One Leg Unable to try or needs assist to prevent fall   Total Score 37   Timed Up and Go Test   Normal TUG (seconds) 30.62   TUG Comments with RW; pt demo unsteadiness with turns and inappropriate use of RW when managing turns                 PT Short Term Goals - 01/07/15 1526    PT SHORT TERM GOAL #1   Title Pt  will be independent with HEP to address balance, mobility, and gait. Target date July 5th.    Time 4   Period Weeks   Status New   PT SHORT TERM GOAL #2   Title pt will be able to perform at least 8 of 10 reps of sit<>stand with minimal to no UE support for improved transfer efficiency. Target date July 5th.    Time 4   Period Weeks   Status New   PT SHORT TERM GOAL #3   Title pt will demonstrate safe management of RW ambulating 300 feet with SBA. Target date July 5th.    Time 4   Period Weeks   Status New   PT SHORT TERM GOAL #4   Title The patient will increase walking speed from 1.37 ft/sec up to 1.8 ft/sec to demo decreased risk for falls (with least restrictive device). Target date July 5th.    Time 4   Period Weeks   Status New   PT SHORT TERM GOAL #5   Title Assess stair negotiation and establish appropriate goal for stair training. Target date July 5th.    Time 4   Period Weeks   Status New   Additional Short Term Goals   Additional Short Term Goals Yes   PT SHORT TERM GOAL #6   Title The patient will improve Berg from 37/56 up to 40/56 to demo decreasing risk for falls. Target date July 5th.   Time 4   Period Weeks   Status New           PT Long Term Goals - 01/07/15 1533    PT LONG TERM GOAL #1   Title The patient will increase walking speed from 1.37 ft/sec up to 2.1 ft/sec to demo decreased  risk for falls (with least restrictive device). Target date August 7th.    Time 8   Period Weeks   Status New   PT LONG TERM GOAL #2   Title The patient will improve Berg from 37/56 up to 43/56 to demo decreasing risk for falls. Target date August 7th.    Time 8   Period Weeks   Status New   PT LONG TERM GOAL #3   Title Pt will improve TUG score to less than or equal to 20 seconds for decreased fall risk. Target date August 7th.   Time 8   Period Weeks   Status New   PT LONG TERM GOAL #4   Title The patient will ambulate 400 feet with SPC and with SBA for community ambulation. Target date August 7th.    Time 8   Period Weeks   Status New               Plan - 01/07/15 1509    Clinical Impression Statement pt is a 64 y.o. male who presents to PT today post basal ganglia infarction which occured in late February 2016. Today the [facility] Scale, the Timed Up and Go test, and his gait velocity assessment classifed him as a falls risk patient. pt also demo deficits regarding his balance, coordination, and with his gait efficenty/safety. It should be noted that pt also has significant bilateral knee pain (which is currently managed/stabilized with bilateral knee immobilizers) which is also impacting his mobility and balance. pt would benefit from skilled PT to address his current deficits to improve his balance, gait, and safety with ambulation with the least restrictive device.    Pt will benefit from skilled therapeutic intervention  in order to improve on the following deficits Abnormal gait;Decreased coordination;Decreased range of motion;Difficulty walking;Decreased safety awareness;Pain;Decreased balance;Decreased knowledge of use of DME;Impaired flexibility;Improper body mechanics;Postural dysfunction;Decreased cognition;Decreased mobility   Rehab Potential Fair   Clinical Impairments Affecting Rehab Potential chronic severe bilateral knee pain   PT Frequency 2x / week   PT  Duration 8 weeks   PT Treatment/Interventions ADLs/Self Care Home Management;Functional mobility training;Patient/family education;Passive range of motion;Therapeutic activities;Biofeedback;Therapeutic exercise;Balance training;Neuromuscular re-education;Manual techniques;Gait training;DME Instruction;Stair training   PT Next Visit Plan assess negotiation of stairs and establish appropriate goals; patient Ed on safety with RW management; gait training with RW; balance activities for HEP   Consulted and Agree with Plan of Care Patient          G-Codes - 02-02-15 1223    Functional Assessment Tool Used Berg=37/56,    Functional Limitation Mobility: Walking and moving around   Mobility: Walking and Moving Around Current Status (F7903) At least 20 percent but less than 40 percent impaired, limited or restricted   Mobility: Walking and Moving Around Goal Status [phone]) At least 1 percent but less than 20 percent impaired, limited or restricted       Problem List Patient Active Problem List   Diagnosis Date Noted  . Arrhythmia 12/02/2014  . Palpitations 12/02/2014  . Cerebral infarction due to thrombosis of left middle cerebral artery 12/02/2014  . HLD (hyperlipidemia) 12/02/2014  . Right hemiparesis 09/25/2014  . Other secondary osteoarthritis of both knees 09/25/2014  . Basal ganglia infarction 09/25/2014  . Ischemic stroke 09/22/2014  . Dyslipidemia 09/22/2014  . Thrombocytopenia 09/22/2014  . Essential hypertension   . Stroke 09/21/2014  . Uncontrolled hypertension 09/21/2014  Thank you for the referral of this patient.    This entire session was performed under direct supervision and direction of a licensed therapist/therapist assistant . I have personally read, edited and approve of the note as written.   [place], [place] 01/08/2015, 12:25 PM Fanny Dance, SPT  [facility] [address] [place],  [place], [address] Phone: [phone]   Fax:  [phone]

## 2015-01-08 ENCOUNTER — Telehealth: Payer: Self-pay | Admitting: Rehabilitative and Restorative Service Providers"

## 2015-01-08 DIAGNOSIS — G8191 Hemiplegia, unspecified affecting right dominant side: Secondary | ICD-10-CM

## 2015-01-08 NOTE — Telephone Encounter (Signed)
PT completed eval on 01/07/2015.  The patient would also benefit from OT to address R UE weakness. If Dr. [name] agrees, please submit referral in EPIC. Thank you, Makayah Pauli, PT

## 2015-01-08 NOTE — Addendum Note (Signed)
Addended by: Charlett Blake on: 01/08/2015 04:11 PM   Modules accepted: Orders

## 2015-01-13 ENCOUNTER — Telehealth: Payer: Self-pay | Admitting: Neurology

## 2015-01-15 ENCOUNTER — Telehealth: Payer: Self-pay | Admitting: Neurology

## 2015-01-15 NOTE — Telephone Encounter (Signed)
Debora, pt's sister called wanting to follow up on the results for his heart monitor he had for 30 days. She does not remember the name of the test. Please call and advise. [name] can be reached @ [phone]

## 2015-01-15 NOTE — Telephone Encounter (Signed)
Spoke with Dr Jackalyn Lombard office; personnel  states that cardiac monitoring test has not been read yet by Dr Rayann Heman. Spoke with [name], patient's daughter and informed. She verbalized understanding, appreciation for this call.

## 2015-01-21 ENCOUNTER — Ambulatory Visit: Payer: Medicare HMO | Admitting: Physical Medicine & Rehabilitation

## 2015-01-21 ENCOUNTER — Encounter: Payer: Medicare HMO | Attending: Physical Medicine & Rehabilitation

## 2015-01-21 ENCOUNTER — Encounter: Payer: Self-pay | Admitting: Physical Medicine & Rehabilitation

## 2015-01-21 ENCOUNTER — Ambulatory Visit (HOSPITAL_BASED_OUTPATIENT_CLINIC_OR_DEPARTMENT_OTHER): Payer: Medicare HMO | Admitting: Physical Medicine & Rehabilitation

## 2015-01-21 VITALS — BP 128/76 | HR 54 | Resp 14

## 2015-01-21 DIAGNOSIS — I69398 Other sequelae of cerebral infarction: Secondary | ICD-10-CM

## 2015-01-21 DIAGNOSIS — R269 Unspecified abnormalities of gait and mobility: Secondary | ICD-10-CM | POA: Diagnosis not present

## 2015-01-21 DIAGNOSIS — M174 Other bilateral secondary osteoarthritis of knee: Secondary | ICD-10-CM | POA: Insufficient documentation

## 2015-01-21 DIAGNOSIS — I639 Cerebral infarction, unspecified: Secondary | ICD-10-CM | POA: Insufficient documentation

## 2015-01-21 DIAGNOSIS — G8190 Hemiplegia, unspecified affecting unspecified side: Secondary | ICD-10-CM | POA: Diagnosis not present

## 2015-01-21 DIAGNOSIS — I6381 Other cerebral infarction due to occlusion or stenosis of small artery: Secondary | ICD-10-CM

## 2015-01-21 NOTE — Progress Notes (Signed)
Subjective:    Patient ID: [name], male    DOB: 1951-05-27, 64 y.o.   MRN: [id number]  HPI Walking with walker Mod I dressing and bathing Fixes his own breakfast , walks outside 30-28min 3 times a week, has to negotiate steps  [facility]  Pain Inventory Average Pain 5 Pain Right Now 5 My pain is constant, sharp, tingling and aching  In the last 24 hours, has pain interfered with the following? General activity 7 Relation with others 5 Enjoyment of life 5 What TIME of day is your pain at its worst? morning Sleep (in general) Good  Pain is worse with: walking, bending, sitting, inactivity, standing and some activites Pain improves with: heat/ice, therapy/exercise, pacing activities, medication and injections Relief from Meds: 2  Mobility walk without assistance use a walker how many minutes can you walk? 5 ability to climb steps?  yes do you drive?  no  Function disabled: date disabled .  Neuro/Psych trouble walking  Prior Studies Any changes since last visit?  no  Physicians involved in your care Any changes since last visit?  no   Family History  Problem Relation Age of Onset  . Stroke Mother   . Cancer Mother     lung  . Stuttering    . Liver cancer Father   . Diabetes Mellitus II Brother    History   Social History  . Marital Status: Single    Spouse Name: N/A  . Number of Children: 0  . Years of Education: 11   Occupational History  . disabled    Social History Main Topics  . Smoking status: Former Smoker    Types: Cigarettes    Quit date: 09/26/1983  . Smokeless tobacco: Not on file  . Alcohol Use: No  . Drug Use: No  . Sexual Activity: Not on file   Other Topics Concern  . None   Social History Narrative   Lives with sister, on disability, no children   Caffeine use - none   Right handed   Past Surgical History  Procedure Laterality Date  . Inguinal hernia repair Right 1988  . Knee  surgery Left 1980's    x 4   Past Medical History  Diagnosis Date  . Knee injury     right knee injury   . Hypertension   . Glaucoma   . Cataracts, bilateral   . Stroke   . Arthritis    BP 128/76 mmHg  Pulse 54  Resp 14  SpO2 99%  Opioid Risk Score:   Fall Risk Score: Low Fall Risk (0-5 points)`1  Depression screen PHQ 2/9  Depression screen PHQ 2/9 12/19/2014  Decreased Interest 0  Down, Depressed, Hopeless 0  PHQ - 2 Score 0  Altered sleeping 1  Tired, decreased energy 1  Change in appetite 0  Feeling bad or failure about yourself  0  Trouble concentrating 0  Moving slowly or fidgety/restless 0  Suicidal thoughts 0  PHQ-9 Score 2     Review of Systems  Constitutional: Negative.   HENT: Negative.   Eyes: Negative.   Respiratory: Negative.   Cardiovascular: Negative.   Gastrointestinal: Negative.   Endocrine: Negative.   Genitourinary: Negative.   Musculoskeletal: Positive for myalgias and arthralgias.       Bilateral knee pain  Skin: Negative.   Allergic/Immunologic: Negative.   Neurological:       Trouble walking  Hematological: Negative.   Psychiatric/Behavioral:  Negative.        Objective:   Physical Exam  Constitutional: He is oriented to person, place, and time. He appears well-developed and well-nourished.  HENT:  Head: Normocephalic and atraumatic.  Eyes: Pupils are equal, round, and reactive to light.  Neurological: He is alert and oriented to person, place, and time.  Psychiatric: He has a normal mood and affect.  Nursing note and vitals reviewed.  4/5 strength Bilateral deltoid , biceps, triceps, grip, HF, KE, ADF Severe valgus deformity left knee Bilateral custom knee orthotics during ambulation Ambulates with walker no evidence of toe drag      Assessment & Plan:  1. 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 Overall improved however still has some residual upper extremity graded lower extreme and weakness. He would  benefit from further therapy, will make an order for outpatient PT and OT, goals would be to upgrade to a least restricted device. He is currently using a walker but needs go up and down steps with this which is very inconvenient. Hopefully he get safe with a cane   2. Bilateral knee osteoarthritis mainly medial compartment. Not a good candidate for Synvisc secondary to degree. Short-term benefit from cortisone injection but ultimately will need surgical referral,  Will see pt back in August after completing [facility] Will likely make referral at that time

## 2015-02-06 ENCOUNTER — Ambulatory Visit: Payer: Medicare HMO | Admitting: Rehabilitative and Restorative Service Providers"

## 2015-02-06 ENCOUNTER — Telehealth: Payer: Self-pay

## 2015-02-06 NOTE — Telephone Encounter (Signed)
-----   Message from Rosalin Hawking, MD sent at 02/06/2015  2:49 PM EDT ----- Hello, could you please let the pt know that his cardiac event monitoring done in May showed no cardiac arrhythmia. Continue current treatment plan. Thanks.  Rosalin Hawking, MD PhD Stroke Neurology 02/06/2015 2:48 PM

## 2015-02-06 NOTE — Telephone Encounter (Signed)
Left message on home phone (per DPR) telling pt that Dr. Erlinda Hong wanted him to know that his cardiac monitoring done in May showed no cardiac arrhythmia and to continue current treatment plan. I asked pt to call back with any questions or concerns.

## 2015-02-07 ENCOUNTER — Ambulatory Visit
Payer: Medicare HMO | Attending: Physical Medicine & Rehabilitation | Admitting: Rehabilitative and Restorative Service Providers"

## 2015-02-07 DIAGNOSIS — R269 Unspecified abnormalities of gait and mobility: Secondary | ICD-10-CM

## 2015-02-07 DIAGNOSIS — M25561 Pain in right knee: Secondary | ICD-10-CM | POA: Insufficient documentation

## 2015-02-07 DIAGNOSIS — R2689 Other abnormalities of gait and mobility: Secondary | ICD-10-CM | POA: Insufficient documentation

## 2015-02-07 DIAGNOSIS — M25562 Pain in left knee: Secondary | ICD-10-CM | POA: Diagnosis present

## 2015-02-07 DIAGNOSIS — G8190 Hemiplegia, unspecified affecting unspecified side: Secondary | ICD-10-CM | POA: Insufficient documentation

## 2015-02-07 NOTE — Therapy (Signed)
[facility] [address] [place], [place], [address] Phone: [phone]   Fax:  [phone]  Physical Therapy Treatment  Patient Details  Name: [name] MRN: [id number] Date of Birth: 06-25-1951 Referring Provider:  Vincente Liberty, MD  Encounter Date: 02/07/2015      PT End of Session - 02/07/15 1206    Visit Number 2   Number of Visits 16   Date for PT Re-Evaluation 03/08/15   Authorization Type G code 10th visit   PT Start Time 0850   PT Stop Time 0932   PT Time Calculation (min) 42 min   Equipment Utilized During Treatment Gait belt   Activity Tolerance Patient tolerated treatment well   Behavior During Therapy [facility] for tasks assessed/performed      Past Medical History  Diagnosis Date  . Knee injury     right knee injury   . Hypertension   . Glaucoma   . Cataracts, bilateral   . Stroke   . Arthritis     Past Surgical History  Procedure Laterality Date  . Inguinal hernia repair Right 1988  . Knee surgery Left 1980's    x 4    There were no vitals filed for this visit.  Visit Diagnosis:  Abnormality of gait and mobility      Subjective Assessment - 02/07/15 0854    Subjective The patient reports that he has a friend that helps him walk to [facility]. He reports most challenging activity is walking outside and going up/down steps.  He requests a cane because he feels like he would move better.     Currently in Pain? Yes   Pain Score 7    Pain Location Knee   Pain Orientation Right;Left   Pain Descriptors / Indicators Aching;Tightness  stiffness   Pain Type Chronic pain   Aggravating Factors  sitting for long periods   Pain Relieving Factors walking      THERAPEUTIC EXERCISE: Standing exercises near countertop consisting of: Heel raises x 20 Toe raises x 20 Sidestepping attempted, but painful, therefore performed hip abduction x 10 reps R/L Walking with one UE support on  counter.  Patient required cues on technqiue for all activities.  Seated hamstring stretches Seated long arc quads  Gait: Ambulation with SPC x 230 ft x 3 reps with cues on foot placement, upright posture and sequencing of SPC.  Patient requires CGA with occasional min A due to loss of balance. Stair negotiation with bilateral handrails and step to pattern R leading with CGA to min A, then educated in sidestepping to negotiate steps with patient requiring SBA to CGA.         PT Education - 02/07/15 1206    Education provided Yes   Education Details HEP: heel raises, toe raises, hip abduction, hamstring stretch.   Person(s) Educated Patient   Methods Explanation;Demonstration;Handout   Comprehension Returned demonstration;Verbalized understanding          PT Short Term Goals - 01/07/15 1526    PT SHORT TERM GOAL #1   Title Pt will be independent with HEP to address balance, mobility, and gait. Target date July 5th.    Time 4   Period Weeks   Status New   PT SHORT TERM GOAL #2   Title pt will be able to perform at least 8 of 10 reps of sit<>stand with minimal to no UE support for improved transfer efficiency. Target date July 5th.    Time 4  Period Weeks   Status New   PT SHORT TERM GOAL #3   Title pt will demonstrate safe management of RW ambulating 300 feet with SBA. Target date July 5th.    Time 4   Period Weeks   Status New   PT SHORT TERM GOAL #4   Title The patient will increase walking speed from 1.37 ft/sec up to 1.8 ft/sec to demo decreased risk for falls (with least restrictive device). Target date July 5th.    Time 4   Period Weeks   Status New   PT SHORT TERM GOAL #5   Title Assess stair negotiation and establish appropriate goal for stair training. Target date July 5th.    Time 4   Period Weeks   Status New   Additional Short Term Goals   Additional Short Term Goals Yes   PT SHORT TERM GOAL #6   Title The patient will improve Berg from 37/56 up to  40/56 to demo decreasing risk for falls. Target date July 5th.   Time 4   Period Weeks   Status New           PT Long Term Goals - 01/07/15 1533    PT LONG TERM GOAL #1   Title The patient will increase walking speed from 1.37 ft/sec up to 2.1 ft/sec to demo decreased risk for falls (with least restrictive device). Target date August 7th.    Time 8   Period Weeks   Status New   PT LONG TERM GOAL #2   Title The patient will improve Berg from 37/56 up to 43/56 to demo decreasing risk for falls. Target date August 7th.    Time 8   Period Weeks   Status New   PT LONG TERM GOAL #3   Title Pt will improve TUG score to less than or equal to 20 seconds for decreased fall risk. Target date August 7th.   Time 8   Period Weeks   Status New   PT LONG TERM GOAL #4   Title The patient will ambulate 400 feet with SPC and with SBA for community ambulation. Target date August 7th.    Time 8   Period Weeks   Status New               Plan - 02/07/15 1207    Clinical Impression Statement Today was patient's first treatment session since evaluation on 01/07/2015.  PT established HEP and will assess short term goals at next visit and update plan of care based on not meeting frequency.   PT Next Visit Plan update/check STGS adding stair goal (req CGA today); safety with RW, balance   Consulted and Agree with Plan of Care Patient        Problem List Patient Active Problem List   Diagnosis Date Noted  . Gait disturbance, post-stroke 01/21/2015  . Arrhythmia 12/02/2014  . Palpitations 12/02/2014  . Cerebral infarction due to thrombosis of left middle cerebral artery 12/02/2014  . HLD (hyperlipidemia) 12/02/2014  . Right hemiparesis 09/25/2014  . Other secondary osteoarthritis of both knees 09/25/2014  . Basal ganglia infarction 09/25/2014  . Ischemic stroke 09/22/2014  . Dyslipidemia 09/22/2014  . Thrombocytopenia 09/22/2014  . Essential hypertension   . Stroke 09/21/2014  .  Uncontrolled hypertension 09/21/2014    Charlize Hathaway, PT 02/07/2015, 12:12 PM  [facility] [address] [place], [place], [address] Phone: [phone]   Fax:  [phone]

## 2015-02-07 NOTE — Patient Instructions (Signed)
*  STAND NEAR COUNTERTOP FOR SAFETY FOR ALL STANDING EXERCISES*  Heel Raise: Bilateral (Standing)   Rise on balls of feet. Repeat ____ times per set. Do ____ sets per session. Do ____ sessions per day.  http://orth.exer.us/38   Copyright  VHI. All rights reserved.  Toe Raise (Standing)   Rock back on heels. Repeat _20___ times per set. Do __1__ sets per session. Do __2__ sessions per day.  http://orth.exer.us/42   Copyright  VHI. All rights reserved.  Hip Abduction: Standing Side Leg Lift (Eccentric)   Lift leg out to side quickly. Slowly lower for 3-5 seconds. Hold support if needed. _10__ reps, 2 times per day.     Copyright  VHI. All rights reserved.  Hamstring Stretch, Seated (Strap, Two Chairs)   Sit with one leg extended onto facing chair. Sit up tall.  NO STRAP FOR NOW. Hold for __30 SECONDS. Repeat _2-3___ times each leg.  Copyright  VHI. All rights reserved.

## 2015-02-12 ENCOUNTER — Ambulatory Visit: Payer: Medicare HMO | Admitting: Rehabilitative and Restorative Service Providers"

## 2015-02-12 ENCOUNTER — Encounter: Payer: Medicare HMO | Admitting: Occupational Therapy

## 2015-02-12 DIAGNOSIS — R269 Unspecified abnormalities of gait and mobility: Secondary | ICD-10-CM | POA: Diagnosis not present

## 2015-02-12 NOTE — Therapy (Signed)
[facility] [address] [place], [place], [address] Phone: [phone]   Fax:  [phone]  Physical Therapy Treatment  Patient Details  Name: [name] MRN: [id number] Date of Birth: 1951-04-18 Referring Provider:  Vincente Liberty, MD  Encounter Date: 02/12/2015      PT End of Session - 02/12/15 1333    Visit Number 3   Number of Visits 16   Date for PT Re-Evaluation 03/08/15   Authorization Type G code 10th visit   PT Start Time 0845   PT Stop Time 0934   PT Time Calculation (min) 49 min   Equipment Utilized During Treatment Gait belt   Activity Tolerance Patient tolerated treatment well   Behavior During Therapy [facility] for tasks assessed/performed      Past Medical History  Diagnosis Date  . Knee injury     right knee injury   . Hypertension   . Glaucoma   . Cataracts, bilateral   . Stroke   . Arthritis     Past Surgical History  Procedure Laterality Date  . Inguinal hernia repair Right 1988  . Knee surgery Left 1980's    x 4    There were no vitals filed for this visit.  Visit Diagnosis:  Abnormality of gait and mobility      Subjective Assessment - 02/12/15 0849    Subjective The patient reports he is walking faster this week.  He is doing HEP regularly.     Currently in Pain? Yes   Pain Score 6    Pain Location Knee   Pain Orientation Right;Left   Pain Descriptors / Indicators Aching;Tightness   Pain Type Chronic pain   Pain Onset More than a month ago   Pain Frequency Intermittent   Aggravating Factors  sitting for long periods   Pain Relieving Factors walking            [facility] PT Assessment - 02/12/15 0904    Standardized Balance Assessment   Standardized Balance Assessment Berg Balance Test   Berg Balance Test   Sit to Stand Able to stand without using hands and stabilize independently   Standing Unsupported Able to stand safely 2 minutes   Sitting with Back  Unsupported but Feet Supported on Floor or Stool Able to sit safely and securely 2 minutes   Stand to Sit Sits safely with minimal use of hands   Transfers Able to transfer safely, minor use of hands   Standing Unsupported with Eyes Closed Able to stand 10 seconds safely   Standing Ubsupported with Feet Together Able to place feet together independently and stand 1 minute safely   From Standing, Reach Forward with Outstretched Arm Can reach forward >12 cm safely (5")   From Standing Position, Pick up Object from Floor Able to pick up shoe safely and easily   From Standing Position, Turn to Look Behind Over each Shoulder Looks behind from both sides and weight shifts well   Turn 360 Degrees Able to turn 360 degrees safely but slowly   Standing Unsupported, Alternately Place Feet on Step/Stool Needs assistance to keep from falling or unable to try   Standing Unsupported, One Foot in Front Able to plae foot ahead of the other independently and hold 30 seconds   Standing on One Leg Tries to lift leg/unable to hold 3 seconds but remains standing independently   Total Score 45    NEUROMUSCULAR RE-EDUCATION: Berg=45/56 Sit<>stand without UE support x 10 reps Standing  weight shift discussing standing quad set for knee control  Gait: Ambulation indoor surfaces x 300+ feet nonstop with RW modified indep.  Ambulation indoor surfaces x 200 feet with SPC with CGA due to knee instability and imbalance with quicker R LE step with SPC vs RW 71M=1.4 ft/sec       PT Short Term Goals - 02/12/15 0855    PT SHORT TERM GOAL #1   Title Pt will be independent with HEP to address balance, mobility, and gait. Target date July 5th.    Baseline INitial HEP met on 02/12/2015.   Time 4   Period Weeks   Status Achieved   PT SHORT TERM GOAL #2   Title pt will be able to perform at least 8 of 10 reps of sit<>stand with minimal to no UE support for improved transfer efficiency. Target date July 5th.    Baseline Pt  did 10/10 repetitions of sit>stand with no UE support.  Pt met on 02/12/2015.   Time 4   Period Weeks   Status Achieved   PT SHORT TERM GOAL #3   Title pt will demonstrate safe management of RW ambulating 300 feet with SBA. Target date July 5th.    Baseline Met on 02/12/2015 with mod indep with RW   Time 4   Period Weeks   Status Achieved   PT SHORT TERM GOAL #4   Title The patient will increase walking speed from 1.37 ft/sec up to 1.8 ft/sec to demo decreased risk for falls (with least restrictive device). Target date July 5th.    Baseline Gait speed 1.40 ft/sec with RW.   Time 4   Period Weeks   Status Not Met   PT SHORT TERM GOAL #5   Title Assess stair negotiation and establish appropriate goal for stair training. Target date July 5th.    Baseline See LTGs.   Time 4   Period Weeks   Status Achieved   PT SHORT TERM GOAL #6   Title The patient will improve Berg from 37/56 up to 40/56 to demo decreasing risk for falls. Target date July 5th.   Baseline Pt improved to 45/56 on 02/12/2015   Time 4   Period Weeks   Status Achieved           PT Long Term Goals - 02/12/15 0920    PT LONG TERM GOAL #1   Title The patient will increase walking speed from 1.37 ft/sec up to 2.1 ft/sec to demo decreased risk for falls (with least restrictive device). Target date August 7th.    Time 8   Period Weeks   Status On-going   PT LONG TERM GOAL #2   Title The patient will improve Berg from 37/56 up to 43/56 to demo decreasing risk for falls. Target date August 7th.    Time 8   Period Weeks   Status Achieved   PT LONG TERM GOAL #3   Title Pt will improve TUG score to less than or equal to 20 seconds for decreased fall risk. Target date August 7th.   Time 8   Period Weeks   Status On-going   PT LONG TERM GOAL #4   Title The patient will ambulate 400 feet with SPC and with SBA for community ambulation. Target date August 7th.    Time 8   Period Weeks   Status On-going   PT LONG TERM  GOAL #5   Title IMprove stair negotiation from CGA x 4 steps to supervision  x 4 steps with UE support.   Time 4   Period Weeks   Status On-going               Plan - 02/12/15 1334    Clinical Impression Statement The patient met all STGs except one for gait speed.  Continue working towards [employer].  Anticipate the patient can progress to [facility] in home, but not community due to knee instability for gait.   PT Next Visit Plan Balance, knee strength (review what patient does at home for knees), gait with SPC indoor surfaces   Consulted and Agree with Plan of Care Patient        Problem List Patient Active Problem List   Diagnosis Date Noted  . Gait disturbance, post-stroke 01/21/2015  . Arrhythmia 12/02/2014  . Palpitations 12/02/2014  . Cerebral infarction due to thrombosis of left middle cerebral artery 12/02/2014  . HLD (hyperlipidemia) 12/02/2014  . Right hemiparesis 09/25/2014  . Other secondary osteoarthritis of both knees 09/25/2014  . Basal ganglia infarction 09/25/2014  . Ischemic stroke 09/22/2014  . Dyslipidemia 09/22/2014  . Thrombocytopenia 09/22/2014  . Essential hypertension   . Stroke 09/21/2014  . Uncontrolled hypertension 09/21/2014    Junior Huezo, PT 02/12/2015, 1:35 PM  [facility] [address] [place], [place], [address] Phone: [phone]   Fax:  [phone]

## 2015-02-14 ENCOUNTER — Ambulatory Visit: Payer: Medicare HMO | Admitting: Physical Therapy

## 2015-02-19 ENCOUNTER — Ambulatory Visit: Payer: Medicare HMO | Admitting: Physical Therapy

## 2015-02-19 ENCOUNTER — Encounter: Payer: Self-pay | Admitting: Physical Therapy

## 2015-02-19 DIAGNOSIS — M25561 Pain in right knee: Secondary | ICD-10-CM

## 2015-02-19 DIAGNOSIS — M25562 Pain in left knee: Secondary | ICD-10-CM

## 2015-02-19 DIAGNOSIS — R269 Unspecified abnormalities of gait and mobility: Secondary | ICD-10-CM | POA: Diagnosis not present

## 2015-02-19 DIAGNOSIS — R2689 Other abnormalities of gait and mobility: Secondary | ICD-10-CM

## 2015-02-19 NOTE — Therapy (Signed)
[facility] [address] [place], [place], [address] Phone: [phone]   Fax:  [phone]  Physical Therapy Treatment  Patient Details  Name: [name] MRN: [id number] Date of Birth: [birth year] Referring Provider:  Vincente Liberty, MD  Encounter Date: 02/19/2015      PT End of Session - 02/19/15 0854    Visit Number 4   Number of Visits 16   Date for PT Re-Evaluation 03/08/15   Authorization Type G code 10th visit   PT Start Time 0847   PT Stop Time 0930   PT Time Calculation (min) 43 min   Equipment Utilized During Treatment Gait belt   Activity Tolerance Patient tolerated treatment well   Behavior During Therapy [facility] for tasks assessed/performed      Past Medical History  Diagnosis Date  . Knee injury     right knee injury   . Hypertension   . Glaucoma   . Cataracts, bilateral   . Stroke   . Arthritis     Past Surgical History  Procedure Laterality Date  . Inguinal hernia repair Right 1988  . Knee surgery Left 1980's    x 4    There were no vitals filed for this visit.  Visit Diagnosis:  Abnormality of gait and mobility  Poor balance  Bilateral knee pain      Subjective Assessment - 02/19/15 0851    Subjective No new complaints. No falls or pain to report. Does have stiffness in both knees that get's better with movement.   Currently in Pain? Yes   Pain Score 5   stiffness vs pain   Pain Location Knee   Pain Orientation Right;Left   Pain Descriptors / Indicators Aching;Sore   Pain Type Chronic pain   Pain Onset More than a month ago   Pain Frequency Intermittent   Aggravating Factors  OA, inactivity   Pain Relieving Factors movement, stretching     Treatment: Gait: 115 feet with cane, min assist with lateral loss of balance x 3 episodes toward the right. Cues to keep feet apart, for upright posture and for sequence with cane.  Exercises: With UE support on chair: Heel  raises 5 sec hold x 10 reps Toe raises 5 sec hold x reps Alternating hip abduction x 10 reps each leg   Neuro Re-ed: Single leg stance activities: min to mod HHA with right knee buckling in single leg stance at times Alternating toe taps to 4 inch box: forward and cross overs x 10 each leg  Standing on airex with chair support: up to min assist for balance with cues on posture and weight shifting Eyes closed no head movements Eyes closed head nods and shakes          PT Short Term Goals - 02/12/15 0855    PT SHORT TERM GOAL #1   Title Pt will be independent with HEP to address balance, mobility, and gait. Target date July 5th.    Baseline INitial HEP met on 02/12/2015.   Time 4   Period Weeks   Status Achieved   PT SHORT TERM GOAL #2   Title pt will be able to perform at least 8 of 10 reps of sit<>stand with minimal to no UE support for improved transfer efficiency. Target date July 5th.    Baseline Pt did 10/10 repetitions of sit>stand with no UE support.  Pt met on 02/12/2015.   Time 4   Period Weeks   Status Achieved  PT SHORT TERM GOAL #3   Title pt will demonstrate safe management of RW ambulating 300 feet with SBA. Target date July 5th.    Baseline Met on 02/12/2015 with mod indep with RW   Time 4   Period Weeks   Status Achieved   PT SHORT TERM GOAL #4   Title The patient will increase walking speed from 1.37 ft/sec up to 1.8 ft/sec to demo decreased risk for falls (with least restrictive device). Target date July 5th.    Baseline Gait speed 1.40 ft/sec with RW.   Time 4   Period Weeks   Status Not Met   PT SHORT TERM GOAL #5   Title Assess stair negotiation and establish appropriate goal for stair training. Target date July 5th.    Baseline See LTGs.   Time 4   Period Weeks   Status Achieved   PT SHORT TERM GOAL #6   Title The patient will improve Berg from 37/56 up to 40/56 to demo decreasing risk for falls. Target date July 5th.   Baseline Pt improved to  45/56 on 02/12/2015   Time 4   Period Weeks   Status Achieved           PT Long Term Goals - 02/12/15 0920    PT LONG TERM GOAL #1   Title The patient will increase walking speed from 1.37 ft/sec up to 2.1 ft/sec to demo decreased risk for falls (with least restrictive device). Target date August 7th.    Time 8   Period Weeks   Status On-going   PT LONG TERM GOAL #2   Title The patient will improve Berg from 37/56 up to 43/56 to demo decreasing risk for falls. Target date August 7th.    Time 8   Period Weeks   Status Achieved   PT LONG TERM GOAL #3   Title Pt will improve TUG score to less than or equal to 20 seconds for decreased fall risk. Target date August 7th.   Time 8   Period Weeks   Status On-going   PT LONG TERM GOAL #4   Title The patient will ambulate 400 feet with SPC and with SBA for community ambulation. Target date August 7th.    Time 8   Period Weeks   Status On-going   PT LONG TERM GOAL #5   Title IMprove stair negotiation from CGA x 4 steps to supervision x 4 steps with UE support.   Time 4   Period Weeks   Status On-going           Plan - 02/19/15 3810    Clinical Impression Statement Pt continues to make steady progress toward goals. No complaints with session today. Challenged by balance activities, needing up to min assist for balance.   Pt will benefit from skilled therapeutic intervention in order to improve on the following deficits Abnormal gait;Decreased coordination;Decreased range of motion;Difficulty walking;Decreased safety awareness;Pain;Decreased balance;Decreased knowledge of use of DME;Impaired flexibility;Improper body mechanics;Postural dysfunction;Decreased cognition;Decreased mobility   Rehab Potential Fair   Clinical Impairments Affecting Rehab Potential chronic severe bilateral knee pain   PT Frequency 2x / week   PT Duration 8 weeks   PT Treatment/Interventions ADLs/Self Care Home Management;Functional mobility  training;Patient/family education;Passive range of motion;Therapeutic activities;Biofeedback;Therapeutic exercise;Balance training;Neuromuscular re-education;Manual techniques;Gait training;DME Instruction;Stair training   PT Next Visit Plan Balance, knee strengthening, gait with SPC indoor surfaces   Consulted and Agree with Plan of Care Patient  Problem List Patient Active Problem List   Diagnosis Date Noted  . Gait disturbance, post-stroke 01/21/2015  . Arrhythmia 12/02/2014  . Palpitations 12/02/2014  . Cerebral infarction due to thrombosis of left middle cerebral artery 12/02/2014  . HLD (hyperlipidemia) 12/02/2014  . Right hemiparesis 09/25/2014  . Other secondary osteoarthritis of both knees 09/25/2014  . Basal ganglia infarction 09/25/2014  . Ischemic stroke 09/22/2014  . Dyslipidemia 09/22/2014  . Thrombocytopenia 09/22/2014  . Essential hypertension   . Stroke 09/21/2014  . Uncontrolled hypertension 09/21/2014    Willow Ora 02/20/2015, 10:21 AM  Willow Ora, PTA, [facility] [address], [place], [place] [address] [phone] 02/20/2015, 10:22 AM

## 2015-02-21 ENCOUNTER — Encounter: Payer: Self-pay | Admitting: Physical Therapy

## 2015-02-21 ENCOUNTER — Ambulatory Visit: Payer: Medicare HMO | Admitting: Physical Therapy

## 2015-02-21 DIAGNOSIS — R2689 Other abnormalities of gait and mobility: Secondary | ICD-10-CM

## 2015-02-21 DIAGNOSIS — M25562 Pain in left knee: Secondary | ICD-10-CM

## 2015-02-21 DIAGNOSIS — R269 Unspecified abnormalities of gait and mobility: Secondary | ICD-10-CM | POA: Diagnosis not present

## 2015-02-21 DIAGNOSIS — G8191 Hemiplegia, unspecified affecting right dominant side: Secondary | ICD-10-CM

## 2015-02-21 DIAGNOSIS — M25561 Pain in right knee: Secondary | ICD-10-CM

## 2015-02-21 NOTE — Therapy (Signed)
[facility] [address] [place], [place], [address] Phone: [phone]   Fax:  [phone]  Physical Therapy Treatment  Patient Details  Name: [name] MRN: [id number] Date of Birth: 06-26-1951 Referring Provider:  Vincente Liberty, MD  Encounter Date: 02/21/2015      PT End of Session - 02/21/15 0853    Visit Number 5   Number of Visits 16   Date for PT Re-Evaluation 03/08/15   Authorization Type G code 10th visit   PT Start Time 0848   PT Stop Time 0930   PT Time Calculation (min) 42 min   Equipment Utilized During Treatment Gait belt   Activity Tolerance Patient tolerated treatment well   Behavior During Therapy [facility] for tasks assessed/performed      Past Medical History  Diagnosis Date  . Knee injury     right knee injury   . Hypertension   . Glaucoma   . Cataracts, bilateral   . Stroke   . Arthritis     Past Surgical History  Procedure Laterality Date  . Inguinal hernia repair Right 1988  . Knee surgery Left 1980's    x 4    There were no vitals filed for this visit.  Visit Diagnosis:  Abnormality of gait and mobility  Poor balance  Bilateral knee pain  Right hemiparesis      Subjective Assessment - 02/21/15 0851    Subjective No new complaints or falls to report. No pain right now, but has pain/stiffness in knees in the morning. Reports doing exercises at home with no issues and feels less stiffness in knees from doing exercises.   Pertinent History bilateral OA knee pain and chronic low back pain; HTN   Patient Stated Goals pt reports he wishes to return to using his cane for ambulation needs (he is currently using a RW)   Currently in Pain? No/denies           [facility] Adult PT Treatment/Exercise - 02/21/15 0858    Ambulation/Gait   Ambulation/Gait Yes   Ambulation/Gait Assistance 4: Min guard;4: Min assist   Ambulation/Gait Assistance Details occasional min assist for  balance; LOB to L x1; cues for upright posture, cane positioning, and B foot clearance   Ambulation Distance (Feet) 115 Feet  x2   Assistive device Straight cane   Gait Pattern Step-to pattern;Decreased step length - right;Decreased step length - left;Decreased weight shift to right;Right flexed knee in stance;Left flexed knee in stance;Wide base of support   Ambulation Surface Level;Indoor     Neuro Re-Ed (min guard for safety; cues for posture, sequencing, and technique): In parallel bars on level surface:  -fwd/bwd marching, heel walking, toe walking, tandem walking x3 laps each ex with B UE support   -alt toe taps on 4" box x20, alt toe taps to R/L side of 4" box 2x20 with single UE support           PT Short Term Goals - 02/12/15 0855    PT SHORT TERM GOAL #1   Title Pt will be independent with HEP to address balance, mobility, and gait. Target date July 5th.    Baseline INitial HEP met on 02/12/2015.   Time 4   Period Weeks   Status Achieved   PT SHORT TERM GOAL #2   Title pt will be able to perform at least 8 of 10 reps of sit<>stand with minimal to no UE support for improved transfer efficiency. Target date July  5th.    Baseline Pt did 10/10 repetitions of sit>stand with no UE support.  Pt met on 02/12/2015.   Time 4   Period Weeks   Status Achieved   PT SHORT TERM GOAL #3   Title pt will demonstrate safe management of RW ambulating 300 feet with SBA. Target date July 5th.    Baseline Met on 02/12/2015 with mod indep with RW   Time 4   Period Weeks   Status Achieved   PT SHORT TERM GOAL #4   Title The patient will increase walking speed from 1.37 ft/sec up to 1.8 ft/sec to demo decreased risk for falls (with least restrictive device). Target date July 5th.    Baseline Gait speed 1.40 ft/sec with RW.   Time 4   Period Weeks   Status Not Met   PT SHORT TERM GOAL #5   Title Assess stair negotiation and establish appropriate goal for stair training. Target date July 5th.     Baseline See LTGs.   Time 4   Period Weeks   Status Achieved   PT SHORT TERM GOAL #6   Title The patient will improve Berg from 37/56 up to 40/56 to demo decreasing risk for falls. Target date July 5th.   Baseline Pt improved to 45/56 on 02/12/2015   Time 4   Period Weeks   Status Achieved           PT Long Term Goals - 02/12/15 0920    PT LONG TERM GOAL #1   Title The patient will increase walking speed from 1.37 ft/sec up to 2.1 ft/sec to demo decreased risk for falls (with least restrictive device). Target date August 7th.    Time 8   Period Weeks   Status On-going   PT LONG TERM GOAL #2   Title The patient will improve Berg from 37/56 up to 43/56 to demo decreasing risk for falls. Target date August 7th.    Time 8   Period Weeks   Status Achieved   PT LONG TERM GOAL #3   Title Pt will improve TUG score to less than or equal to 20 seconds for decreased fall risk. Target date August 7th.   Time 8   Period Weeks   Status On-going   PT LONG TERM GOAL #4   Title The patient will ambulate 400 feet with SPC and with SBA for community ambulation. Target date August 7th.    Time 8   Period Weeks   Status On-going   PT LONG TERM GOAL #5   Title IMprove stair negotiation from CGA x 4 steps to supervision x 4 steps with UE support.   Time 4   Period Weeks   Status On-going           Plan - 02/21/15 1215    Clinical Impression Statement Pt steadily improving safety and mobility with ambulation using SPC. Pt anxious to begin using SPC at home. Educated on importance of improving balance and safety before using cane outside of therapy. Continues to be challenged with balance activities, requiring UE support for balance. Making steady progress toward goals.   Pt will benefit from skilled therapeutic intervention in order to improve on the following deficits Abnormal gait;Decreased coordination;Decreased range of motion;Difficulty walking;Decreased safety  awareness;Pain;Decreased balance;Decreased knowledge of use of DME;Impaired flexibility;Improper body mechanics;Postural dysfunction;Decreased cognition;Decreased mobility   Rehab Potential Fair   Clinical Impairments Affecting Rehab Potential chronic severe bilateral knee pain   PT Frequency 2x /  week   PT Duration 8 weeks   PT Treatment/Interventions ADLs/Self Care Home Management;Functional mobility training;Patient/family education;Passive range of motion;Therapeutic activities;Biofeedback;Therapeutic exercise;Balance training;Neuromuscular re-education;Manual techniques;Gait training;DME Instruction;Stair training   PT Next Visit Plan Balance, knee strengthening, gait with SPC indoor surfaces   Consulted and Agree with Plan of Care Patient        Problem List Patient Active Problem List   Diagnosis Date Noted  . Gait disturbance, post-stroke 01/21/2015  . Arrhythmia 12/02/2014  . Palpitations 12/02/2014  . Cerebral infarction due to thrombosis of left middle cerebral artery 12/02/2014  . HLD (hyperlipidemia) 12/02/2014  . Right hemiparesis 09/25/2014  . Other secondary osteoarthritis of both knees 09/25/2014  . Basal ganglia infarction 09/25/2014  . Ischemic stroke 09/22/2014  . Dyslipidemia 09/22/2014  . Thrombocytopenia 09/22/2014  . Essential hypertension   . Stroke 09/21/2014  . Uncontrolled hypertension 09/21/2014    Rubye Oaks 02/21/2015, 12:21 PM  Rubye Oaks, [facility] [address] [place], [place], [address] Phone: [phone]   Fax:  [phone]

## 2015-02-25 ENCOUNTER — Ambulatory Visit: Payer: Medicare HMO | Admitting: Rehabilitative and Restorative Service Providers"

## 2015-02-25 DIAGNOSIS — R269 Unspecified abnormalities of gait and mobility: Secondary | ICD-10-CM

## 2015-02-25 DIAGNOSIS — G8191 Hemiplegia, unspecified affecting right dominant side: Secondary | ICD-10-CM

## 2015-02-25 NOTE — Patient Instructions (Signed)
KNEE: Extension, Long Arc Quads - Sitting   Raise leg until knee is straight. __10_ reps on each leg, _2__ sets per day, _6__ days per week  Copyright  VHI. All rights reserved.  HIP: Flexion / KNEE: Extension, Straight Leg Raise   Raise leg, keeping knee straight. Perform slowly. _10__ reps each leg, __2_ sets per day, _6__ days per week   Copyright  VHI. All rights reserved.  Quad Set   With other leg bent, foot flat, slowly tighten muscles on thigh of straight leg while counting out loud to __5__. Repeat with other leg. Repeat _10___ times on each leg. Do __2__ sessions per day.  [email or web address].exer.[email or web address]   Copyright  VHI. All rights reserved.

## 2015-02-25 NOTE — Telephone Encounter (Signed)
ERROR

## 2015-02-25 NOTE — Therapy (Signed)
[facility] [address] [place], [place], [address] Phone: [phone]   Fax:  [phone]  Physical Therapy Treatment  Patient Details  Name: [name] MRN: [id number] Date of Birth: Apr 30, 1951 Referring Provider:  Vincente Liberty, MD  Encounter Date: 02/25/2015      PT End of Session - 02/25/15 0858    Visit Number 6   Number of Visits 16   Date for PT Re-Evaluation 03/08/15   Authorization Type G code 10th visit   PT Start Time 0851   PT Stop Time 0932   PT Time Calculation (min) 41 min   Equipment Utilized During Treatment Gait belt   Activity Tolerance Patient tolerated treatment well   Behavior During Therapy [name] for tasks assessed/performed      Past Medical History  Diagnosis Date  . Knee injury     right knee injury   . Hypertension   . Glaucoma   . Cataracts, bilateral   . Stroke   . Arthritis     Past Surgical History  Procedure Laterality Date  . Inguinal hernia repair Right 1988  . Knee surgery Left 1980's    x 4    There were no vitals filed for this visit.  Visit Diagnosis:  Abnormality of gait and mobility  Hemiparesis, right      Subjective Assessment - 02/25/15 0855    Subjective The patient reports continuing to use RW in home and community.  He feels like he is moving faster.  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   Patient Stated Goals pt reports he wishes to return to using his cane for ambulation needs (he is currently using a RW)   Currently in Pain? Yes   Pain Score 4    Pain Location Knee   Pain Orientation Right;Left   Pain Descriptors / Indicators Aching;Sore   Pain Type Chronic pain   Pain Onset More than a month ago   Pain Frequency Intermittent      Gait: Ambulation with SPC x 345 feet, 230 feet with SBA to CGA with cues on posture. Attempted pre-gait activities working on L knee flexion with feet in stride  position, however that movement causes significant L knee pain and patient needs to continue to keep L knee maintained in extended position t/o gait cycle due to pain/instability.   NEUROMUSCULAR RE-EDUCATION: Sidestepping x 10 feet x 4 reps Attempted marching, and stepping into/out of stride and patient unable to tolerate due to knee pain  THERAPEUTIC EXERCISE: Standing hip abduction x 5 reps each side Standing hip flexor stretch Thomas test stretch Hamstring stretch long arc quads x 10 reps each leg with cues on quad contraction Quad set with tactile cues Straight leg raise x 10 reps each leg with cues on quad contraction      PT Education - 02/25/15 [phone]    Education provided Yes   Education Details HEP: quad set, long arc quad, and straight leg raise   Person(s) Educated Patient   Methods Explanation;Demonstration;Handout   Comprehension Returned demonstration;Verbalized understanding          PT Short Term Goals - 02/12/15 0855    PT SHORT TERM GOAL #1   Title Pt will be independent with HEP to address balance, mobility, and gait. Target date July 5th.    Baseline INitial HEP met on 02/12/2015.   Time 4   Period Weeks   Status Achieved   PT SHORT  TERM GOAL #2   Title pt will be able to perform at least 8 of 10 reps of sit<>stand with minimal to no UE support for improved transfer efficiency. Target date July 5th.    Baseline Pt did 10/10 repetitions of sit>stand with no UE support.  Pt met on 02/12/2015.   Time 4   Period Weeks   Status Achieved   PT SHORT TERM GOAL #3   Title pt will demonstrate safe management of RW ambulating 300 feet with SBA. Target date July 5th.    Baseline Met on 02/12/2015 with mod indep with RW   Time 4   Period Weeks   Status Achieved   PT SHORT TERM GOAL #4   Title The patient will increase walking speed from 1.37 ft/sec up to 1.8 ft/sec to demo decreased risk for falls (with least restrictive device). Target date July 5th.    Baseline  Gait speed 1.40 ft/sec with RW.   Time 4   Period Weeks   Status Not Met   PT SHORT TERM GOAL #5   Title Assess stair negotiation and establish appropriate goal for stair training. Target date July 5th.    Baseline See LTGs.   Time 4   Period Weeks   Status Achieved   PT SHORT TERM GOAL #6   Title The patient will improve Berg from 37/56 up to 40/56 to demo decreasing risk for falls. Target date July 5th.   Baseline Pt improved to 45/56 on 02/12/2015   Time 4   Period Weeks   Status Achieved           PT Long Term Goals - 02/12/15 0920    PT LONG TERM GOAL #1   Title The patient will increase walking speed from 1.37 ft/sec up to 2.1 ft/sec to demo decreased risk for falls (with least restrictive device). Target date August 7th.    Time 8   Period Weeks   Status On-going   PT LONG TERM GOAL #2   Title The patient will improve Berg from 37/56 up to 43/56 to demo decreasing risk for falls. Target date August 7th.    Time 8   Period Weeks   Status Achieved   PT LONG TERM GOAL #3   Title Pt will improve TUG score to less than or equal to 20 seconds for decreased fall risk. Target date August 7th.   Time 8   Period Weeks   Status On-going   PT LONG TERM GOAL #4   Title The patient will ambulate 400 feet with SPC and with SBA for community ambulation. Target date August 7th.    Time 8   Period Weeks   Status On-going   PT LONG TERM GOAL #5   Title IMprove stair negotiation from CGA x 4 steps to supervision x 4 steps with UE support.   Time 4   Period Weeks   Status On-going               Plan - 02/25/15 1431    Clinical Impression Statement The patient is making continued progress with ambulation with SPC.  LE strengthening HEP provided for increased knee support/control today.  PT to begin checking LTGs.  He is currently most hindered by knee posture, knee pain and decreased gait mechanics due to knee instability.   PT Next Visit Plan Balance, knee strengthening,  gait with SPC indoor surfaces  *Begin d/c planning and checking LTGs.   Consulted and Agree with Plan  of Care Patient        Problem List Patient Active Problem List   Diagnosis Date Noted  . Gait disturbance, post-stroke 01/21/2015  . Arrhythmia 12/02/2014  . Palpitations 12/02/2014  . Cerebral infarction due to thrombosis of left middle cerebral artery 12/02/2014  . HLD (hyperlipidemia) 12/02/2014  . Right hemiparesis 09/25/2014  . Other secondary osteoarthritis of both knees 09/25/2014  . Basal ganglia infarction 09/25/2014  . Ischemic stroke 09/22/2014  . Dyslipidemia 09/22/2014  . Thrombocytopenia 09/22/2014  . Essential hypertension   . Stroke 09/21/2014  . Uncontrolled hypertension 09/21/2014    WEAVER,CHRISTINA, PT 02/25/2015, 2:32 PM  [facility] [address] [place], [place], [address] Phone: [phone]   Fax:  [phone]

## 2015-02-26 ENCOUNTER — Encounter: Payer: Self-pay | Admitting: Neurology

## 2015-02-26 ENCOUNTER — Ambulatory Visit (INDEPENDENT_AMBULATORY_CARE_PROVIDER_SITE_OTHER): Payer: Medicare HMO | Admitting: Neurology

## 2015-02-26 VITALS — BP 123/82 | HR 61 | Ht 67.0 in | Wt 174.6 lb

## 2015-02-26 DIAGNOSIS — I63312 Cerebral infarction due to thrombosis of left middle cerebral artery: Secondary | ICD-10-CM

## 2015-02-26 DIAGNOSIS — E785 Hyperlipidemia, unspecified: Secondary | ICD-10-CM

## 2015-02-26 DIAGNOSIS — I10 Essential (primary) hypertension: Secondary | ICD-10-CM

## 2015-02-26 NOTE — Patient Instructions (Addendum)
-   continue ASA and lipitor for stroke prevention - continue BP meds for better BP control - check BP at home. - Follow up with your primary care physician for stroke risk factor modification. Recommend maintain blood pressure goal <130/80, diabetes with hemoglobin A1c goal below 6.5% and lipids with LDL cholesterol goal below 70 mg/dL.  - continue PT/OT and increase exercise of right hand. - RTC in 6 months

## 2015-02-26 NOTE — Progress Notes (Signed)
STROKE NEUROLOGY FOLLOW UP NOTE  NAME: [name] DOB: 02-Jun-1951  REASON FOR VISIT: stroke follow up HISTORY FROM: sister  Today we had the pleasure of seeing [name] in follow-up at our Neurology Clinic. Pt was accompanied by chart and wife and pt.   History Summary [name] is an 64 y.o. male with a history of HTN, knee injury and arthritis was admitted on 09/24/14 for right side weakness for 10 days. 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 Tiny remote left pontine and left basal ganglia infarctions were also noted. Stroke work up negative including MRA, 2D echo and CUS as well as A1C. Only LDL 80. He was put on ASA 325mg  and lipitor 10mg  and d/c to CIR.   12/02/14 follow up - the patient has been doing well. Had CIR for 7 days and then home PT/OT for a month. Right side weakness much better but still not back to baseline yet. Has to walk with walker now and before with cane. He can not write as good as before. His BP 145/85 and he is on amlodipine, metoprolol, lisinopril.   Interval History During the interval time, pt has been doing well. He is on 4 BP medication now and he checks his BP every morning. His BP 123/82 today in clinic. He still has right hand dexterity difficulty but mild. He still has outpt PT/OT.   REVIEW OF SYSTEMS: Full 14 system review of systems performed and notable only for those listed below and in HPI above, all others are negative:  Constitutional:  Fatigue  Cardiovascular:  Ear/Nose/Throat:  Runny nose Skin:  Eyes:   Respiratory:  Cough, wheezing, SOB Gastroitestinal:   Genitourinary:  Hematology/Lymphatic:   Endocrine:  Musculoskeletal:  Joint pain, aching muscle, muscle cramps, walking difficulty, joint swelling Allergy/Immunology:   Neurological:  weakness Psychiatric:  Sleep: restless leg, daytime sleepiness  The following represents the patient's  updated allergies and side effects list: No Known Allergies  The neurologically relevant items on the patient's problem list were reviewed on today's visit.  Neurologic Examination  A problem focused neurological exam (12 or more points of the single system neurologic examination, vital signs counts as 1 point, cranial nerves count for 8 points) was performed.  Blood pressure 123/82, pulse 61, height 5\' 7"  (1.702 m), weight 174 lb 9.6 oz (79.198 kg).  General - Well nourished, well developed, in no apparent distress.  Ophthalmologic - Sharp disc margins OU.  Cardiovascular - regular rate and rhythm.  Mental Status -  Level of arousal and orientation to time, place, and person were intact. Language including expression, naming, repetition, comprehension was assessed and found intact. Fund of Knowledge was assessed and was intact.  Cranial Nerves II - XII - II - Visual field intact OU. III, IV, VI - Extraocular movements intact. V - Facial sensation intact bilaterally. VII - Facial movement intact bilaterally. VIII - Hearing & vestibular intact bilaterally. X - Palate elevates symmetrically. XI - Chin turning & shoulder shrug intact bilaterally. XII - Tongue protrusion intact.  Motor Strength - The patient's strength was normal in all extremities and pronator drift was absent except right hand decreased dexterity.  Bulk was normal and fasciculations were absent.   Motor Tone - Muscle tone was assessed at the neck and appendages and was normal.  Reflexes - The patient's reflexes were normal in all extremities and he had no pathological reflexes.  Sensory - Light  touch, temperature/pinprick were assessed and were normal.    Coordination - The patient had normal movements in the hands with no ataxia or dysmetria, but right FTN slower than left.  Tremor was absent.  Gait and Station - walk with walker, pt has deformity of both knees, bow-shaped knees on walking, walk slow and small  stride.  Data reviewed: I personally reviewed the images and agree with the radiology interpretations.  MRI brain 1. Subacute ischemic infarct involving the superior left basal ganglia with cephalad extension into the periventricular white matter of the left corona radiata. No associated hemorrhage or significant mass effect. 2. Tiny remote left pontine and left basal ganglia lacunar infarcts. 3. Atrophy with moderate chronic microvascular ischemic disease.  MRA Normal intracranial MRA without evidence of hemodynamically significant stenosis or proximal branch occlusion.   Carotid Doppler 1-39% ICA stenosis. Vertebral artery flow is antegrade.  2D Echo EF 65% no clot, no shunt  30 day cardiac event monitoring - negative for afib  Component     Latest Ref Rng 09/22/2014  Cholesterol     0 - 200 mg/dL 127  Triglycerides     <150 mg/dL 55  HDL Cholesterol     >39 mg/dL 36 (L)  Total CHOL/HDL Ratio      3.5  VLDL     0 - 40 mg/dL 11  LDL (calc)     0 - 99 mg/dL 80  Hemoglobin A1C     4.8 - 5.6 % 5.5  Mean Plasma Glucose      111  TSH     0.350 - 4.500 uIU/mL 0.785  Vitamin B-12     211 - 911 pg/mL >2000 (H)    Assessment: As you may recall, he is a 64 y.o. African American male with PMH of HTN, knee arthritis with bow-shaped knees was admitted on 09/22/14 for left BG and CR infarct on MRI. The size of infarct is larger than typical lacunar stroke. Stroke work up all negative including MRA, CUS, 2D echo and A1C. Only has LDL 80. Stroke risk factor include HTN and slightly high LDL. On ASA and lipitor. BP under control with 4 BP meds. 30 day cardiac event monitoring negative for afib.  Plan:  - continue ASA and lipitor for stroke prevention - continue BP meds for better BP control - check BP at home. - Follow up with your primary care physician for stroke risk factor modification. Recommend maintain blood pressure goal <130/80, diabetes with hemoglobin A1c goal below 6.5%  and lipids with LDL cholesterol goal below 70 mg/dL.  - continue PT/OT and increase exercise of right hand. - RTC in 6 months  No orders of the defined types were placed in this encounter.    No orders of the defined types were placed in this encounter.    Patient Instructions  - continue ASA and lipitor for stroke prevention - continue BP meds for better BP control - check BP at home. - Follow up with your primary care physician for stroke risk factor modification. Recommend maintain blood pressure goal <130/80, diabetes with hemoglobin A1c goal below 6.5% and lipids with LDL cholesterol goal below 70 mg/dL.  - continue PT/OT and increase exercise of right hand. - RTC in 6 months    Rosalin Hawking, MD PhD [facility] [address], [place], [place] [address] [phone]

## 2015-02-28 ENCOUNTER — Encounter: Payer: Self-pay | Admitting: Physical Therapy

## 2015-02-28 ENCOUNTER — Telehealth: Payer: Self-pay | Admitting: Rehabilitative and Restorative Service Providers"

## 2015-02-28 ENCOUNTER — Ambulatory Visit: Payer: Medicare HMO | Admitting: Physical Therapy

## 2015-02-28 DIAGNOSIS — R269 Unspecified abnormalities of gait and mobility: Secondary | ICD-10-CM

## 2015-02-28 DIAGNOSIS — R2689 Other abnormalities of gait and mobility: Secondary | ICD-10-CM

## 2015-02-28 DIAGNOSIS — I639 Cerebral infarction, unspecified: Secondary | ICD-10-CM

## 2015-02-28 DIAGNOSIS — M25561 Pain in right knee: Secondary | ICD-10-CM

## 2015-02-28 DIAGNOSIS — M25562 Pain in left knee: Secondary | ICD-10-CM

## 2015-02-28 DIAGNOSIS — G8191 Hemiplegia, unspecified affecting right dominant side: Secondary | ICD-10-CM

## 2015-02-28 DIAGNOSIS — I69398 Other sequelae of cerebral infarction: Secondary | ICD-10-CM

## 2015-02-28 NOTE — Addendum Note (Signed)
Addended by: Caro Hight on: 02/28/2015 01:09 PM   Modules accepted: Orders

## 2015-02-28 NOTE — Telephone Encounter (Signed)
Mr. [name] is progressing to [facility] level for short household distances.  Please send order for single point cane. Thanks, Rudell Cobb, [place]

## 2015-02-28 NOTE — Telephone Encounter (Signed)
Please send order for single-point cane

## 2015-02-28 NOTE — Telephone Encounter (Signed)
Order placed and faxed

## 2015-02-28 NOTE — Therapy (Signed)
[facility] [address] [place], [place], [address] Phone: [phone]   Fax:  [phone]  Physical Therapy Treatment  Patient Details  Name: [name] MRN: [id number] Date of Birth: September 14, 1950 Referring Provider:  Vincente Liberty, MD  Encounter Date: 02/28/2015      PT End of Session - 02/28/15 0852    Visit Number 7   Number of Visits 16   Date for PT Re-Evaluation 03/08/15   Authorization Type G code 10th visit   PT Start Time 0846   PT Stop Time 0930   PT Time Calculation (min) 44 min   Equipment Utilized During Treatment Gait belt   Activity Tolerance Patient tolerated treatment well   Behavior During Therapy [facility] for tasks assessed/performed      Past Medical History  Diagnosis Date  . Knee injury     right knee injury   . Hypertension   . Glaucoma   . Cataracts, bilateral   . Stroke   . Arthritis     Past Surgical History  Procedure Laterality Date  . Inguinal hernia repair Right 1988  . Knee surgery Left 1980's    x 4    There were no vitals filed for this visit.  Visit Diagnosis:  Abnormality of gait and mobility  Bilateral knee pain  Hemiparesis, right  Poor balance      Subjective Assessment - 02/28/15 0850    Subjective No new complaints. Saw Dr Erlinda Hong, he want's him to work on his right UE (not being addressed by PT and not active with OT). No falls. Reports his pain is better today.   Currently in Pain? Yes   Pain Score 5    Pain Location Knee   Pain Orientation Right;Left   Pain Descriptors / Indicators Aching;Sore   Pain Type Chronic pain   Pain Onset More than a month ago   Pain Frequency Intermittent   Aggravating Factors  OA, inactivity   Pain Relieving Factors movement, stretching           [facility] Adult PT Treatment/Exercise - 02/28/15 0855    Ambulation/Gait   Ambulation/Gait Yes   Ambulation/Gait Assistance 4: Min guard;4: Min assist;5: Supervision   supervision with walker only   Ambulation/Gait Assistance Details cues on posture, increased step length and increased foot clearance. Increased assist for balance needed with cane vs walker                 Ambulation Distance (Feet) 220 Feet  x1 cane; 250 x1 walker   Assistive device Straight cane   Gait Pattern Step-through pattern;Decreased stride length;Decreased step length - right;Decreased step length - left;Narrow base of support;Right flexed knee in stance;Left flexed knee in stance  bil knee deformies as well   Ambulation Surface Level;Indoor   Gait velocity 25.82 sec= 1.27 ft/sec with cane; 25.06 sec= 1.31 ft/sec with walker   Stairs Yes   Stairs Assistance 5: Supervision   Stairs Assistance Details (indicate cue type and reason) no cues or assist needed, increased time needed   Stair Management Technique Two rails;Step to pattern;Forwards   Number of Stairs 4   Timed Up and Go Test   TUG Normal TUG   Normal TUG (seconds) 27.69  with cane; 34.22 sec's with walker           PT Short Term Goals - 02/12/15 0855    PT SHORT TERM GOAL #1   Title Pt will be independent with HEP to address  balance, mobility, and gait. Target date July 5th.    Baseline INitial HEP met on 02/12/2015.   Time 4   Period Weeks   Status Achieved   PT SHORT TERM GOAL #2   Title pt will be able to perform at least 8 of 10 reps of sit<>stand with minimal to no UE support for improved transfer efficiency. Target date July 5th.    Baseline Pt did 10/10 repetitions of sit>stand with no UE support.  Pt met on 02/12/2015.   Time 4   Period Weeks   Status Achieved   PT SHORT TERM GOAL #3   Title pt will demonstrate safe management of RW ambulating 300 feet with SBA. Target date July 5th.    Baseline Met on 02/12/2015 with mod indep with RW   Time 4   Period Weeks   Status Achieved   PT SHORT TERM GOAL #4   Title The patient will increase walking speed from 1.37 ft/sec up to 1.8 ft/sec to demo  decreased risk for falls (with least restrictive device). Target date July 5th.    Baseline Gait speed 1.40 ft/sec with RW.   Time 4   Period Weeks   Status Not Met   PT SHORT TERM GOAL #5   Title Assess stair negotiation and establish appropriate goal for stair training. Target date July 5th.    Baseline See LTGs.   Time 4   Period Weeks   Status Achieved   PT SHORT TERM GOAL #6   Title The patient will improve Berg from 37/56 up to 40/56 to demo decreasing risk for falls. Target date July 5th.   Baseline Pt improved to 45/56 on 02/12/2015   Time 4   Period Weeks   Status Achieved           PT Long Term Goals - 02/28/15 8333    PT LONG TERM GOAL #1   Title The patient will increase walking speed from 1.37 ft/sec up to 2.1 ft/sec to demo decreased risk for falls (with least restrictive device). Target date August 7th.    Time 8   Period Weeks   Status Not Met   PT LONG TERM GOAL #2   Title The patient will improve Berg from 37/56 up to 43/56 to demo decreasing risk for falls. Target date August 7th.    Time 8   Period Weeks   Status Achieved   PT LONG TERM GOAL #3   Title Pt will improve TUG score to less than or equal to 20 seconds for decreased fall risk. Target date August 7th.   Time 8   Period Weeks   Status Not Met   PT LONG TERM GOAL #4   Title The patient will ambulate 400 feet with SPC and with SBA for community ambulation. Target date August 7th.    Time 8   Period Weeks   Status On-going   PT LONG TERM GOAL #5   Title IMprove stair negotiation from CGA x 4 steps to supervision x 4 steps with UE support.   Time 4   Period Weeks   Status Achieved            Plan - 02/28/15 8329    Clinical Impression Statement Pt has made progress toward gait goals, however none met with testing today. Pt mostly limited by knee pain at this time with gait. After discussion with primary PT, will plan to discharge earlier than end of plan of care.  Pt will benefit from skilled therapeutic intervention in order to improve on the following deficits Abnormal gait;Decreased coordination;Decreased range of motion;Difficulty walking;Decreased safety awareness;Pain;Decreased balance;Decreased knowledge of use of DME;Impaired flexibility;Improper body mechanics;Postural dysfunction;Decreased cognition;Decreased mobility   Rehab Potential Fair   Clinical Impairments Affecting Rehab Potential chronic severe bilateral knee pain   PT Frequency 2x / week   PT Duration 8 weeks   PT Treatment/Interventions ADLs/Self Care Home Management;Functional mobility training;Patient/family education;Passive range of motion;Therapeutic activities;Biofeedback;Therapeutic exercise;Balance training;Neuromuscular re-education;Manual techniques;Gait training;DME Instruction;Stair training   PT Next Visit Plan assess remaining goals for discharge next week   Consulted and Agree with Plan of Care Patient        Problem List Patient Active Problem List   Diagnosis Date Noted  . Gait disturbance, post-stroke 01/21/2015  . Arrhythmia 12/02/2014  . Palpitations 12/02/2014  . Cerebral infarction due to thrombosis of left middle cerebral artery 12/02/2014  . HLD (hyperlipidemia) 12/02/2014  . Right hemiparesis 09/25/2014  . Other secondary osteoarthritis of both knees 09/25/2014  . Basal ganglia infarction 09/25/2014  . Ischemic stroke 09/22/2014  . Dyslipidemia 09/22/2014  . Thrombocytopenia 09/22/2014  . Essential hypertension   . Stroke 09/21/2014  . Uncontrolled hypertension 09/21/2014    Willow Ora 02/28/2015, 9:40 AM  Willow Ora, PTA, [facility] [address], [place], [place] [address] [phone] 02/28/2015, 9:40 AM

## 2015-03-03 ENCOUNTER — Encounter: Payer: Self-pay | Admitting: Physical Therapy

## 2015-03-03 ENCOUNTER — Ambulatory Visit: Payer: Medicare HMO | Attending: Physical Medicine & Rehabilitation | Admitting: Physical Therapy

## 2015-03-03 DIAGNOSIS — I69398 Other sequelae of cerebral infarction: Secondary | ICD-10-CM | POA: Diagnosis present

## 2015-03-03 DIAGNOSIS — R2689 Other abnormalities of gait and mobility: Secondary | ICD-10-CM

## 2015-03-03 DIAGNOSIS — M25562 Pain in left knee: Secondary | ICD-10-CM | POA: Insufficient documentation

## 2015-03-03 DIAGNOSIS — M25561 Pain in right knee: Secondary | ICD-10-CM | POA: Diagnosis present

## 2015-03-03 DIAGNOSIS — G8190 Hemiplegia, unspecified affecting unspecified side: Secondary | ICD-10-CM | POA: Insufficient documentation

## 2015-03-03 DIAGNOSIS — R269 Unspecified abnormalities of gait and mobility: Secondary | ICD-10-CM | POA: Insufficient documentation

## 2015-03-03 DIAGNOSIS — G8191 Hemiplegia, unspecified affecting right dominant side: Secondary | ICD-10-CM

## 2015-03-03 NOTE — Therapy (Signed)
[facility] [address] [place], [place], [address] Phone: [phone]   Fax:  [phone]  Physical Therapy Treatment  Patient Details  Name: [name] MRN: [id number] Date of Birth: 03-Jun-1951 Referring Provider:  Vincente Liberty, MD  Encounter Date: 03/03/2015      PT End of Session - 03/03/15 0851    Visit Number 8   Number of Visits 16   Date for PT Re-Evaluation 03/08/15   Authorization Type G code 10th visit   PT Start Time 0848   PT Stop Time 0921   PT Time Calculation (min) 33 min   Equipment Utilized During Treatment Gait belt   Activity Tolerance Patient tolerated treatment well   Behavior During Therapy [facility] for tasks assessed/performed      Past Medical History  Diagnosis Date  . Knee injury     right knee injury   . Hypertension   . Glaucoma   . Cataracts, bilateral   . Stroke   . Arthritis     Past Surgical History  Procedure Laterality Date  . Inguinal hernia repair Right 1988  . Knee surgery Left 1980's    x 4    There were no vitals filed for this visit.  Visit Diagnosis:  Poor balance  Abnormality of gait  Gait disturbance, post-stroke  Abnormality of gait and mobility  Right hemiparesis  Bilateral knee pain      Subjective Assessment - 03/03/15 0850    Subjective No new complaints. No falls. Reports the exercises are going well at home.   Currently in Pain? Yes   Pain Score 5    Pain Location Knee   Pain Orientation Right;Left   Pain Type Chronic pain   Pain Onset More than a month ago   Pain Frequency Intermittent   Aggravating Factors  OA, inactivity   Pain Relieving Factors movement, stretching           [facility] Adult PT Treatment/Exercise - 03/03/15 0853    Ambulation/Gait   Ambulation/Gait Yes   Ambulation/Gait Assistance 4: Min guard   Ambulation/Gait Assistance Details cues on posture, step length and to relax right upper extremity   Ambulation Distance (Feet) 345 Feet   Assistive device Straight cane   Gait Pattern Step-through pattern;Decreased stride length;Decreased step length - right;Decreased step length - left;Narrow base of support;Right flexed knee in stance;Left flexed knee in stance   Ambulation Surface Level;Indoor   Berg Balance Test   Sit to Stand Able to stand without using hands and stabilize independently   Standing Unsupported Able to stand safely 2 minutes   Sitting with Back Unsupported but Feet Supported on Floor or Stool Able to sit safely and securely 2 minutes   Stand to Sit Sits safely with minimal use of hands   Transfers Able to transfer safely, minor use of hands   Standing Unsupported with Eyes Closed Able to stand 10 seconds safely   Standing Ubsupported with Feet Together Able to place feet together independently and stand 1 minute safely   From Standing, Reach Forward with Outstretched Arm Can reach confidently >25 cm (10")   From Standing Position, Pick up Object from Floor Able to pick up shoe safely and easily   From Standing Position, Turn to Look Behind Over each Shoulder Looks behind one side only/other side shows less weight shift  right > left   Turn 360 Degrees Able to turn 360 degrees safely but slowly   Standing Unsupported, Alternately Place  Feet on Step/Stool Able to complete 4 steps without aid or supervision   Standing Unsupported, One Foot in Front Able to plae foot ahead of the other independently and hold 30 seconds   Standing on One Leg Tries to lift leg/unable to hold 3 seconds but remains standing independently   Total Score 47           PT Short Term Goals - 02/12/15 0855    PT SHORT TERM GOAL #1   Title Pt will be independent with HEP to address balance, mobility, and gait. Target date July 5th.    Baseline INitial HEP met on 02/12/2015.   Time 4   Period Weeks   Status Achieved   PT SHORT TERM GOAL #2   Title pt will be able to perform at least 8 of 10  reps of sit<>stand with minimal to no UE support for improved transfer efficiency. Target date July 5th.    Baseline Pt did 10/10 repetitions of sit>stand with no UE support.  Pt met on 02/12/2015.   Time 4   Period Weeks   Status Achieved   PT SHORT TERM GOAL #3   Title pt will demonstrate safe management of RW ambulating 300 feet with SBA. Target date July 5th.    Baseline Met on 02/12/2015 with mod indep with RW   Time 4   Period Weeks   Status Achieved   PT SHORT TERM GOAL #4   Title The patient will increase walking speed from 1.37 ft/sec up to 1.8 ft/sec to demo decreased risk for falls (with least restrictive device). Target date July 5th.    Baseline Gait speed 1.40 ft/sec with RW.   Time 4   Period Weeks   Status Not Met   PT SHORT TERM GOAL #5   Title Assess stair negotiation and establish appropriate goal for stair training. Target date July 5th.    Baseline See LTGs.   Time 4   Period Weeks   Status Achieved   PT SHORT TERM GOAL #6   Title The patient will improve Berg from 37/56 up to 40/56 to demo decreasing risk for falls. Target date July 5th.   Baseline Pt improved to 45/56 on 02/12/2015   Time 4   Period Weeks   Status Achieved           PT Long Term Goals - 03/03/15 2876    PT LONG TERM GOAL #1   Title The patient will increase walking speed from 1.37 ft/sec up to 2.1 ft/sec to demo decreased risk for falls (with least restrictive device). Target date August 7th.    Baseline 7/29: 1/27 ft/sec with cane, 1/31 ft/sec with walker   Time --   Period --   Status Not Met   PT LONG TERM GOAL #2   Title The patient will improve Berg from 37/56 up to 43/56 to demo decreasing risk for falls. Target date August 7th.    Baseline 03/03/15: 47/56 score today   Time --   Period --   Status Achieved   PT LONG TERM GOAL #3   Title Pt will improve TUG score to less than or equal to 20 seconds for decreased fall risk. Target date August 7th.   Baseline 7/29: 27.69 sec  with cane, 34.22 sec's with walker   Time --   Period --   Status Not Met   PT LONG TERM GOAL #4   Title The patient will ambulate 400 feet with SPC  and with SBA for community ambulation. Target date August 7th.    Baseline 03/03/15: was able to go 230 feet today before needed to rest due to knee pain with gait.   Time --   Period --   Status On-going   PT LONG TERM GOAL #5   Title IMprove stair negotiation from CGA x 4 steps to supervision x 4 steps with UE support.   Baseline 7/29: 4 stairs with 2 rails forward with supervision   Time --   Period --   Status Achieved           Plan - 03/03/15 0852    Clinical Impression Statement Berg Balance Test score met, no gait goals met today. Pt has made significant progress toward all the goals. limited by bil knee pain in distance and AD needed for gait.   Pt will benefit from skilled therapeutic intervention in order to improve on the following deficits Abnormal gait;Decreased coordination;Decreased range of motion;Difficulty walking;Decreased safety awareness;Pain;Decreased balance;Decreased knowledge of use of DME;Impaired flexibility;Improper body mechanics;Postural dysfunction;Decreased cognition;Decreased mobility   Rehab Potential Fair   Clinical Impairments Affecting Rehab Potential chronic severe bilateral knee pain   PT Frequency 2x / week   PT Duration 8 weeks   PT Treatment/Interventions ADLs/Self Care Home Management;Functional mobility training;Patient/family education;Passive range of motion;Therapeutic activities;Biofeedback;Therapeutic exercise;Balance training;Neuromuscular re-education;Manual techniques;Gait training;DME Instruction;Stair training   PT Next Visit Plan dishcarge per PT plan of care   Consulted and Agree with Plan of Care Patient      Problem List Patient Active Problem List   Diagnosis Date Noted  . Gait disturbance, post-stroke 01/21/2015  . Arrhythmia 12/02/2014  . Palpitations 12/02/2014  .  Cerebral infarction due to thrombosis of left middle cerebral artery 12/02/2014  . HLD (hyperlipidemia) 12/02/2014  . Right hemiparesis 09/25/2014  . Other secondary osteoarthritis of both knees 09/25/2014  . Basal ganglia infarction 09/25/2014  . Ischemic stroke 09/22/2014  . Dyslipidemia 09/22/2014  . Thrombocytopenia 09/22/2014  . Essential hypertension   . Stroke 09/21/2014  . Uncontrolled hypertension 09/21/2014    Willow Ora 03/03/2015, 1:39 PM  Willow Ora, PTA, [facility] [address] [place], [place] [address] [phone] 03/03/2015, 1:39 PM

## 2015-03-04 ENCOUNTER — Ambulatory Visit: Payer: Medicare HMO | Admitting: Rehabilitative and Restorative Service Providers"

## 2015-03-06 ENCOUNTER — Encounter: Payer: Self-pay | Admitting: Rehabilitative and Restorative Service Providers"

## 2015-03-06 NOTE — Therapy (Signed)
[facility] [address] [place], [place], [address] Phone: [phone]   Fax:  [phone]  Patient Details  Name: [name] MRN: [id number] Date of Birth: 1951/03/08 Referring Provider:  No ref. provider found  Encounter Date: 03/03/2015  PHYSICAL THERAPY DISCHARGE SUMMARY  Visits from Start of Care: 03/03/2015  Current functional level related to goals / functional outcomes:     PT Short Term Goals - 02/12/15 0855    PT SHORT TERM GOAL #1   Title Pt will be independent with HEP to address balance, mobility, and gait. Target date July 5th.    Baseline INitial HEP met on 02/12/2015.   Time 4   Period Weeks   Status Achieved   PT SHORT TERM GOAL #2   Title pt will be able to perform at least 8 of 10 reps of sit<>stand with minimal to no UE support for improved transfer efficiency. Target date July 5th.    Baseline Pt did 10/10 repetitions of sit>stand with no UE support.  Pt met on 02/12/2015.   Time 4   Period Weeks   Status Achieved   PT SHORT TERM GOAL #3   Title pt will demonstrate safe management of RW ambulating 300 feet with SBA. Target date July 5th.    Baseline Met on 02/12/2015 with mod indep with RW   Time 4   Period Weeks   Status Achieved   PT SHORT TERM GOAL #4   Title The patient will increase walking speed from 1.37 ft/sec up to 1.8 ft/sec to demo decreased risk for falls (with least restrictive device). Target date July 5th.    Baseline Gait speed 1.40 ft/sec with RW.   Time 4   Period Weeks   Status Not Met   PT SHORT TERM GOAL #5   Title Assess stair negotiation and establish appropriate goal for stair training. Target date July 5th.    Baseline See LTGs.   Time 4   Period Weeks   Status Achieved   PT SHORT TERM GOAL #6   Title The patient will improve Berg from 37/56 up to 40/56 to demo decreasing risk for falls. Target date July 5th.   Baseline Pt improved to 45/56 on 02/12/2015   Time  4   Period Weeks   Status Achieved         PT Long Term Goals - 03/03/15 0938    PT LONG TERM GOAL #1   Title The patient will increase walking speed from 1.37 ft/sec up to 2.1 ft/sec to demo decreased risk for falls (with least restrictive device). Target date August 7th.    Baseline 7/29: 1/27 ft/sec with cane, 1/31 ft/sec with walker   Time --   Period --   Status Not Met   PT LONG TERM GOAL #2   Title The patient will improve Berg from 37/56 up to 43/56 to demo decreasing risk for falls. Target date August 7th.    Baseline 03/03/15: 47/56 score today   Time --   Period --   Status Achieved   PT LONG TERM GOAL #3   Title Pt will improve TUG score to less than or equal to 20 seconds for decreased fall risk. Target date August 7th.   Baseline 7/29: 27.69 sec with cane, 34.22 sec's with walker   Time --   Period --   Status Not Met   PT LONG TERM GOAL #4   Title The patient will ambulate 400 feet  with SPC and with SBA for community ambulation. Target date August 7th.    Baseline 03/03/15: was able to go 230 feet today before needed to rest due to knee pain with gait.   Time --   Period --   Status Not met due to knee pain   PT LONG TERM GOAL #5   Title IMprove stair negotiation from CGA x 4 steps to supervision x 4 steps with UE support.   Baseline 7/29: 4 stairs with 2 rails forward with supervision   Time --   Period --   Status Achieved        Remaining deficits: Patient has h/o chronic knee pain with deformity limiting current progress in therapy with gait speed and distance of ambulation.   Decreased high level balance.   Education / Equipment: HEP, home safety, assistive device recommendations.  Plan: Patient agrees to discharge.  Patient goals were partially met. Patient is being discharged due to meeting the stated rehab goals.  ?????Thank you for the referral of this patient. Rudell Cobb, MPT          [place], PT 03/06/2015, 4:55 PM  [facility] [address] [place], [place], [address] Phone: [phone]   Fax:  [phone]

## 2015-03-11 ENCOUNTER — Ambulatory Visit: Payer: Medicare HMO

## 2015-03-11 ENCOUNTER — Ambulatory Visit: Payer: Medicare HMO | Admitting: Physical Medicine & Rehabilitation

## 2015-03-24 ENCOUNTER — Encounter: Payer: Medicare HMO | Attending: Physical Medicine & Rehabilitation

## 2015-03-24 ENCOUNTER — Encounter: Payer: Self-pay | Admitting: Physical Medicine & Rehabilitation

## 2015-03-24 ENCOUNTER — Ambulatory Visit (HOSPITAL_BASED_OUTPATIENT_CLINIC_OR_DEPARTMENT_OTHER): Payer: Medicare HMO | Admitting: Physical Medicine & Rehabilitation

## 2015-03-24 VITALS — BP 131/75 | HR 61

## 2015-03-24 DIAGNOSIS — M174 Other bilateral secondary osteoarthritis of knee: Secondary | ICD-10-CM

## 2015-03-24 DIAGNOSIS — R269 Unspecified abnormalities of gait and mobility: Secondary | ICD-10-CM

## 2015-03-24 DIAGNOSIS — G8190 Hemiplegia, unspecified affecting unspecified side: Secondary | ICD-10-CM

## 2015-03-24 DIAGNOSIS — I639 Cerebral infarction, unspecified: Secondary | ICD-10-CM | POA: Diagnosis present

## 2015-03-24 DIAGNOSIS — G8191 Hemiplegia, unspecified affecting right dominant side: Secondary | ICD-10-CM

## 2015-03-24 DIAGNOSIS — I69398 Other sequelae of cerebral infarction: Secondary | ICD-10-CM | POA: Diagnosis not present

## 2015-03-24 DIAGNOSIS — I6381 Other cerebral infarction due to occlusion or stenosis of small artery: Secondary | ICD-10-CM

## 2015-03-24 NOTE — Patient Instructions (Addendum)
Refferal to Orthopedic surgeon Dr Ninfa Linden

## 2015-03-24 NOTE — Progress Notes (Signed)
Subjective:    Patient ID: [name], male    DOB: [birth year], 64 y.o.   MRN: [id number]  HPI Completed outpatient therapy on August 1. The patient is walking with a rolling walker but has tried a straight cane in therapy and reported that it worked well. Before his stroke he was walking with a cane. He continues to have bilateral knee pain. We discussed possible surgical referral.  Patient has had no falls. He is independent with all his self-care and mobility. Pain Inventory Average Pain 5 Pain Right Now 5 My pain is aching  In the last 24 hours, has pain interfered with the following? General activity 5 Relation with others 5 Enjoyment of life 5 What TIME of day is your pain at its worst? morning Sleep (in general) Good  Pain is worse with: standing Pain improves with: therapy/exercise Relief from Meds: no pain med  Mobility use a walker  Function disabled: date disabled .  Neuro/Psych No problems in this area  Prior Studies Any changes since last visit?  no  Physicians involved in your care Any changes since last visit?  no   Family History  Problem Relation Age of Onset  . Stroke Mother   . Cancer Mother     lung  . Stuttering    . Liver cancer Father   . Diabetes Mellitus II Brother    Social History   Social History  . Marital Status: Single    Spouse Name: N/A  . Number of Children: 0  . Years of Education: 11   Occupational History  . disabled    Social History Main Topics  . Smoking status: Former Smoker    Types: Cigarettes    Quit date: 09/26/1983  . Smokeless tobacco: None  . Alcohol Use: No  . Drug Use: No  . Sexual Activity: Not Asked   Other Topics Concern  . None   Social History Narrative   Lives with sister, on disability, no children   Caffeine use - none   Right handed   Past Surgical History  Procedure Laterality Date  . Inguinal hernia repair Right 1988  . Knee surgery Left 1980's    x 4   Past Medical  History  Diagnosis Date  . Knee injury     right knee injury   . Hypertension   . Glaucoma   . Cataracts, bilateral   . Stroke   . Arthritis    BP 131/75 mmHg  Pulse 61  SpO2 97%  Opioid Risk Score:   Fall Risk Score:  `1  Depression screen PHQ 2/9  Depression screen [facility] 2/9 03/24/2015 12/19/2014  Decreased Interest 0 0  Down, Depressed, Hopeless 0 0  PHQ - 2 Score 0 0  Altered sleeping - 1  Tired, decreased energy - 1  Change in appetite - 0  Feeling bad or failure about yourself  - 0  Trouble concentrating - 0  Moving slowly or fidgety/restless - 0  Suicidal thoughts - 0  PHQ-9 Score - 2    Review of Systems  All other systems reviewed and are negative.      Objective:   Physical Exam  Constitutional: He is oriented to person, place, and time. He appears well-developed and well-nourished.  HENT:  Head: Normocephalic and atraumatic.  Right Ear: External ear normal.  Left Ear: External ear normal.  Eyes: Conjunctivae and EOM are normal. Pupils are equal, round, and reactive to light.  Musculoskeletal:  Right knee: He exhibits decreased range of motion and deformity. He exhibits no effusion.       Left knee: He exhibits decreased range of motion and deformity. He exhibits no effusion.  Neurological: He is alert and oriented to person, place, and time. He has normal strength. No sensory deficit.  Motor strength is 5/5 bilateral deltoids, biceps, triceps, grip, hip flexion, knee extension, ankle dorsi flexor plantar flexion  Psychiatric: He has a normal mood and affect.  Nursing note and vitals reviewed.  Patient has bilateral knee orthoses nevertheless it is still obvious that he has valgus deformity at both knees.       Assessment & Plan:  1. History of CVA, Left basal ganglia infarct, onset 6 months ago with good neurologic recovery. He has carotid Dopplers that do not show any significant stenosis.  2. Bilateral knee osteoarthritis severe with joint  deformities. I do think he'll need surgical intervention. We will refer to Dr. Rush Farmer at [facility] orthopedics. I'll see Him back In 3 months

## 2015-05-16 DIAGNOSIS — H409 Unspecified glaucoma: Secondary | ICD-10-CM | POA: Diagnosis not present

## 2015-05-16 DIAGNOSIS — I10 Essential (primary) hypertension: Secondary | ICD-10-CM | POA: Diagnosis not present

## 2015-05-16 DIAGNOSIS — I69351 Hemiplegia and hemiparesis following cerebral infarction affecting right dominant side: Secondary | ICD-10-CM | POA: Diagnosis not present

## 2015-05-16 DIAGNOSIS — H26493 Other secondary cataract, bilateral: Secondary | ICD-10-CM | POA: Diagnosis not present

## 2015-05-26 DIAGNOSIS — R05 Cough: Secondary | ICD-10-CM | POA: Diagnosis not present

## 2015-05-26 DIAGNOSIS — J410 Simple chronic bronchitis: Secondary | ICD-10-CM | POA: Diagnosis not present

## 2015-05-26 DIAGNOSIS — I6990 Unspecified sequelae of unspecified cerebrovascular disease: Secondary | ICD-10-CM | POA: Diagnosis not present

## 2015-05-26 DIAGNOSIS — E784 Other hyperlipidemia: Secondary | ICD-10-CM | POA: Diagnosis not present

## 2015-05-26 DIAGNOSIS — R740 Nonspecific elevation of levels of transaminase and lactic acid dehydrogenase [LDH]: Secondary | ICD-10-CM | POA: Diagnosis not present

## 2015-05-26 DIAGNOSIS — Z0001 Encounter for general adult medical examination with abnormal findings: Secondary | ICD-10-CM | POA: Diagnosis not present

## 2015-05-26 DIAGNOSIS — Z125 Encounter for screening for malignant neoplasm of prostate: Secondary | ICD-10-CM | POA: Diagnosis not present

## 2015-05-26 DIAGNOSIS — R7302 Impaired glucose tolerance (oral): Secondary | ICD-10-CM | POA: Diagnosis not present

## 2015-05-26 DIAGNOSIS — I119 Hypertensive heart disease without heart failure: Secondary | ICD-10-CM | POA: Diagnosis not present

## 2015-05-26 DIAGNOSIS — Z79899 Other long term (current) drug therapy: Secondary | ICD-10-CM | POA: Diagnosis not present

## 2015-05-26 DIAGNOSIS — Z23 Encounter for immunization: Secondary | ICD-10-CM | POA: Diagnosis not present

## 2015-05-26 DIAGNOSIS — E785 Hyperlipidemia, unspecified: Secondary | ICD-10-CM | POA: Diagnosis not present

## 2015-06-11 ENCOUNTER — Other Ambulatory Visit (HOSPITAL_COMMUNITY): Payer: Self-pay | Admitting: Pulmonary Disease

## 2015-06-11 ENCOUNTER — Ambulatory Visit (HOSPITAL_COMMUNITY)
Admission: RE | Admit: 2015-06-11 | Discharge: 2015-06-11 | Disposition: A | Discharge status: Home / Self Care | Payer: Medicare HMO | Source: Ambulatory Visit | Attending: Pulmonary Disease | Admitting: Pulmonary Disease

## 2015-06-11 DIAGNOSIS — R05 Cough: Secondary | ICD-10-CM

## 2015-06-11 DIAGNOSIS — R918 Other nonspecific abnormal finding of lung field: Secondary | ICD-10-CM | POA: Insufficient documentation

## 2015-06-11 DIAGNOSIS — R059 Cough, unspecified: Secondary | ICD-10-CM

## 2015-06-11 DIAGNOSIS — Z8673 Personal history of transient ischemic attack (TIA), and cerebral infarction without residual deficits: Secondary | ICD-10-CM | POA: Diagnosis not present

## 2015-06-16 ENCOUNTER — Other Ambulatory Visit (HOSPITAL_COMMUNITY): Payer: Self-pay | Admitting: Pulmonary Disease

## 2015-06-16 DIAGNOSIS — R911 Solitary pulmonary nodule: Secondary | ICD-10-CM

## 2015-06-17 ENCOUNTER — Encounter: Payer: Self-pay | Admitting: Physical Medicine & Rehabilitation

## 2015-06-17 ENCOUNTER — Encounter: Payer: Medicare HMO | Attending: Physical Medicine & Rehabilitation

## 2015-06-17 ENCOUNTER — Ambulatory Visit (HOSPITAL_BASED_OUTPATIENT_CLINIC_OR_DEPARTMENT_OTHER): Payer: Medicare HMO | Admitting: Physical Medicine & Rehabilitation

## 2015-06-17 VITALS — BP 126/74 | HR 60

## 2015-06-17 DIAGNOSIS — M174 Other bilateral secondary osteoarthritis of knee: Secondary | ICD-10-CM | POA: Insufficient documentation

## 2015-06-17 DIAGNOSIS — I69398 Other sequelae of cerebral infarction: Secondary | ICD-10-CM | POA: Diagnosis not present

## 2015-06-17 DIAGNOSIS — I639 Cerebral infarction, unspecified: Secondary | ICD-10-CM | POA: Insufficient documentation

## 2015-06-17 DIAGNOSIS — R269 Unspecified abnormalities of gait and mobility: Secondary | ICD-10-CM | POA: Diagnosis not present

## 2015-06-17 DIAGNOSIS — G8190 Hemiplegia, unspecified affecting unspecified side: Secondary | ICD-10-CM | POA: Insufficient documentation

## 2015-06-17 NOTE — Progress Notes (Signed)
Subjective:    Patient ID: [name], male    DOB: Jun 15, 1951, 64 y.o.   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 He had resultant right hemiparesis as well as gait disorder. In addition he has severe osteoarthritis of both knees. He completed both inpatient as well as outpatient rehabilitation. His outpatient rehabilitation was concluded in August 2016. He has residual gait disorder which is partly due to balance issues from the stroke but also influenced by his severe osteoarthritis of the knees. He had one episode where he passed out in a [facility].[name] His wife feels that he's had some more problems with his ambulation since that time. His primary care physician has referred him for a CT of the chest after a chest x-ray showed a right apical 2 cm mass. Patient has been referred to orthopedic surgery but because of these other issues has not followed up yet.  Wife works during the day and he has some difficulty with his morning ADLs as well as getting breakfast.  Pain Inventory Average Pain 7 Pain Right Now 7 My pain is NA  In the last 24 hours, has pain interfered with the following? General activity 10 Relation with others 10 Enjoyment of life NA What TIME of day is your pain at its worst? morning Sleep (in general) Good  Pain is worse with: walking, bending, sitting, inactivity, standing and some activites Pain improves with: rest, heat/ice, therapy/exercise and pacing activities Relief from Meds: NA  Mobility walk with assistance use a cane use a walker ability to climb steps?  yes do you drive?  no needs help with transfers  Function disabled: date disabled NA I need assistance with the following:  bathing, meal prep, household duties and shopping Do you have any goals in this area?  no  Neuro/Psych trouble walking dizziness confusion  Prior Studies Any changes since last visit?   yes  Physicians involved in your care Any changes since last visit?  yes   Family History  Problem Relation Age of Onset  . Stroke Mother   . Cancer Mother     lung  . Stuttering    . Liver cancer Father   . Diabetes Mellitus II Brother    Social History   Social History  . Marital Status: Single    Spouse Name: N/A  . Number of Children: 0  . Years of Education: 11   Occupational History  . disabled    Social History Main Topics  . Smoking status: Former Smoker    Types: Cigarettes    Quit date: 09/26/1983  . Smokeless tobacco: None  . Alcohol Use: No  . Drug Use: No  . Sexual Activity: Not Asked   Other Topics Concern  . None   Social History Narrative   Lives with sister, on disability, no children   Caffeine use - none   Right handed   Past Surgical History  Procedure Laterality Date  . Inguinal hernia repair Right 1988  . Knee surgery Left 1980's    x 4   Past Medical History  Diagnosis Date  . Knee injury     right knee injury   . Hypertension   . Glaucoma   . Cataracts, bilateral   . Stroke ([place])   . Arthritis    BP 126/74 mmHg  Pulse 60  SpO2 99%  Opioid Risk Score:   Fall Risk Score:  `1  Depression screen  PHQ 2/9  Depression screen [facility] 2/9 03/24/2015 12/19/2014  Decreased Interest 0 0  Down, Depressed, Hopeless 0 0  PHQ - 2 Score 0 0  Altered sleeping - 1  Tired, decreased energy - 1  Change in appetite - 0  Feeling bad or failure about yourself  - 0  Trouble concentrating - 0  Moving slowly or fidgety/restless - 0  Suicidal thoughts - 0  PHQ-9 Score - 2     Review of Systems  Constitutional: Positive for unexpected weight change.  Respiratory: Positive for cough, shortness of breath and wheezing.   Neurological: Positive for dizziness.       Gait Instability  Psychiatric/Behavioral: Positive for confusion.  All other systems reviewed and are negative.      Objective:   Physical Exam  Constitutional: He is oriented  to person, place, and time. He appears well-developed and well-nourished.  HENT:  Head: Normocephalic and atraumatic.  Eyes: Conjunctivae and EOM are normal. Pupils are equal, round, and reactive to light.  Neurological: He is alert and oriented to person, place, and time. He has normal strength. He displays no atrophy and no tremor. No sensory deficit. He exhibits normal muscle tone. Coordination normal.  Psychiatric: He has a normal mood and affect.  Nursing note and vitals reviewed.   Motor strength is 5/5 bilateral deltoid, biceps, triceps, grip, hip flexor, knee extensor, ankle dorsiflexor and plantar flexor He has no evidence of dysmetria on finger-nose-finger testing There is no evidence of dysdiadochokinesis with rapid alternating movements of the upper extremities. His gait is using a walker with wheels as well as bilateral knee orthoses. He has a varus deformity at bilateral knees. Mood and affect are appropriate      Assessment & Plan:  1. 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 Patient has residual balance deficits which interfere with his mobility. Will write for home health CNA to assist him in the mornings. Also write for a new rolling walker. He may benefit from a Rollator  He will follow up with neurology in January  2. Severe bilateral osteoarthritis of the knees will hold off on seeing this orthopedic surgeon until after he has further workup of his lung mass  3.Right apical lung mass follow-up CT scan his primary physician is working this up  Follow-up physical medicine rehabilitation on a when necessary basis

## 2015-06-23 ENCOUNTER — Ambulatory Visit (HOSPITAL_COMMUNITY): Payer: Medicare HMO

## 2015-06-30 ENCOUNTER — Encounter (HOSPITAL_COMMUNITY): Payer: Self-pay

## 2015-06-30 ENCOUNTER — Ambulatory Visit (HOSPITAL_COMMUNITY)
Admission: RE | Admit: 2015-06-30 | Discharge: 2015-06-30 | Disposition: A | Discharge status: Home / Self Care | Payer: Medicare HMO | Source: Ambulatory Visit | Attending: Pulmonary Disease | Admitting: Pulmonary Disease

## 2015-06-30 DIAGNOSIS — R911 Solitary pulmonary nodule: Secondary | ICD-10-CM | POA: Diagnosis not present

## 2015-06-30 DIAGNOSIS — I10 Essential (primary) hypertension: Secondary | ICD-10-CM | POA: Insufficient documentation

## 2015-06-30 DIAGNOSIS — Z8673 Personal history of transient ischemic attack (TIA), and cerebral infarction without residual deficits: Secondary | ICD-10-CM | POA: Insufficient documentation

## 2015-06-30 DIAGNOSIS — R918 Other nonspecific abnormal finding of lung field: Secondary | ICD-10-CM | POA: Insufficient documentation

## 2015-06-30 LAB — POCT I-STAT CREATININE: Creatinine, Ser: 1.2 mg/dL (ref 0.61–1.24)

## 2015-06-30 MED ORDER — IOHEXOL 300 MG/ML  SOLN
80.0000 mL | Freq: Once | INTRAMUSCULAR | Status: AC | PRN
  Administered 2015-06-30: 80 mL via INTRAVENOUS

## 2015-08-26 ENCOUNTER — Ambulatory Visit (INDEPENDENT_AMBULATORY_CARE_PROVIDER_SITE_OTHER): Payer: Medicare HMO | Admitting: Neurology

## 2015-08-26 ENCOUNTER — Encounter: Payer: Self-pay | Admitting: Neurology

## 2015-08-26 VITALS — BP 109/72 | HR 57 | Ht 67.0 in | Wt 173.6 lb

## 2015-08-26 DIAGNOSIS — I63312 Cerebral infarction due to thrombosis of left middle cerebral artery: Secondary | ICD-10-CM

## 2015-08-26 DIAGNOSIS — I10 Essential (primary) hypertension: Secondary | ICD-10-CM

## 2015-08-26 DIAGNOSIS — E785 Hyperlipidemia, unspecified: Secondary | ICD-10-CM

## 2015-08-26 NOTE — Progress Notes (Signed)
STROKE NEUROLOGY FOLLOW UP NOTE  NAME: [name] DOB: [birth year]  REASON FOR VISIT: stroke follow up HISTORY FROM: sister  Today we had the pleasure of seeing [name] in follow-up at our Neurology Clinic. Pt was accompanied by chart and wife and pt.   History Summary [name] is an 65 y.o. male with a history of HTN, knee injury and arthritis was admitted on 09/24/14 for right side weakness for 10 days. 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 Tiny remote left pontine and left basal ganglia infarctions were also noted. Stroke work up negative including MRA, 2D echo and CUS as well as A1C. Only LDL 80. He was put on ASA 325mg  and lipitor 10mg  and d/c to CIR.   12/02/14 follow up - the patient has been doing well. Had CIR for 7 days and then home PT/OT for a month. Right side weakness much better but still not back to baseline yet. Has to walk with walker now and before with cane. He can not write as good as before. His BP 145/85 and he is on amlodipine, metoprolol, lisinopril.   02/26/15 follow up - pt has been doing well. He is on 4 BP medication now and he checks his BP every morning. His BP 123/82 today in clinic. He still has right hand dexterity difficulty but mild. He still has outpt PT/OT.   Interval History During the interval time, pt has been doing well. Still has some mild right hand dexterity but much improved and able to write. No more PT but exercise at home. BP at home stable under 120, currently on 3 BP meds with another one PRN. Today in clinic 109/72. HR 57. He has appointment with PCP in 2 days. One day on 04/18/2015, hot day, he did not eat, [facility], seemed like he had vasovagal syncope due to abdominal pain and passed out for 2 seconds and he lowed himself down to the ground.   REVIEW OF SYSTEMS: Full 14 system review of systems performed and notable only for those  listed below and in HPI above, all others are negative:  Constitutional:    Cardiovascular:  Ear/Nose/Throat:   Skin:  Eyes:   Respiratory:   Gastroitestinal:   Genitourinary:  Hematology/Lymphatic:   Endocrine:  Musculoskeletal:  Allergy/Immunology:   Neurological:   Psychiatric:  Sleep:   The following represents the patient's updated allergies and side effects list: No Known Allergies  The neurologically relevant items on the patient's problem list were reviewed on today's visit.  Neurologic Examination  A problem focused neurological exam (12 or more points of the single system neurologic examination, vital signs counts as 1 point, cranial nerves count for 8 points) was performed.  Blood pressure 109/72, pulse 57, height 5\' 7"  (1.702 m), weight 173 lb 9.6 oz (78.744 kg).  General - Well nourished, well developed, in no apparent distress.  Ophthalmologic - Sharp disc margins OU.  Cardiovascular - regular rate and rhythm.  Mental Status -  Level of arousal and orientation to time, place, and person were intact. Language including expression, naming, repetition, comprehension was assessed and found intact. Fund of Knowledge was assessed and was intact.  Cranial Nerves II - XII - II - Visual field intact OU. III, IV, VI - Extraocular movements intact. V - Facial sensation intact bilaterally. VII - Facial movement intact bilaterally. VIII - Hearing & vestibular intact bilaterally. X - Palate  elevates symmetrically. XI - Chin turning & shoulder shrug intact bilaterally. XII - Tongue protrusion intact.  Motor Strength - The patient's strength was normal in all extremities and pronator drift was absent except right hand decreased dexterity.  Bulk was normal and fasciculations were absent.   Motor Tone - Muscle tone was assessed at the neck and appendages and was normal.  Reflexes - The patient's reflexes were normal in all extremities and he had no pathological  reflexes.  Sensory - Light touch, temperature/pinprick were assessed and were normal.    Coordination - The patient had normal movements in the hands with no ataxia or dysmetria, but right FTN slower than left.  Tremor was absent.  Gait and Station - walk with walker, pt has deformity of both knees, bow-shaped knees on walking, walk slow and small stride.  Data reviewed: I personally reviewed the images and agree with the radiology interpretations.  MRI brain 1. Subacute ischemic infarct involving the superior left basal ganglia with cephalad extension into the periventricular white matter of the left corona radiata. No associated hemorrhage or significant mass effect. 2. Tiny remote left pontine and left basal ganglia lacunar infarcts. 3. Atrophy with moderate chronic microvascular ischemic disease.  MRA Normal intracranial MRA without evidence of hemodynamically significant stenosis or proximal branch occlusion.  Carotid Doppler 1-39% ICA stenosis. Vertebral artery flow is antegrade.  2D Echo EF 65% no clot, no shunt  30 day cardiac event monitoring - negative for afib  Component     Latest Ref Rng 09/22/2014  Cholesterol     0 - 200 mg/dL 127  Triglycerides     <150 mg/dL 55  HDL Cholesterol     >39 mg/dL 36 (L)  Total CHOL/HDL Ratio      3.5  VLDL     0 - 40 mg/dL 11  LDL (calc)     0 - 99 mg/dL 80  Hemoglobin A1C     4.8 - 5.6 % 5.5  Mean Plasma Glucose      111  TSH     0.350 - 4.500 uIU/mL 0.785  Vitamin B-12     211 - 911 pg/mL >2000 (H)    Assessment: As you may recall, he is a 65 y.o. African American male with PMH of HTN, knee arthritis with bow-shaped knees was admitted on 09/22/14 for left BG and CR infarct on MRI. The size of infarct is larger than typical lacunar stroke. Stroke work up all negative including MRA, CUS, 2D echo and A1C. Only has LDL 80. Stroke risk factor include HTN and slightly high LDL. On ASA and lipitor. BP under control with 3 BP  meds now. 30 day cardiac event monitoring negative for afib.   Plan:  - continue ASA and lipitor for stroke prevention - continue BP meds for better BP control - check BP at home. - Follow up with your primary care physician for stroke risk factor modification. Recommend maintain blood pressure goal <130/80, diabetes with hemoglobin A1c goal below 6.5% and lipids with LDL cholesterol goal below 70 mg/dL.  - continue home exercise of right hand. - follow up as needed.   No orders of the defined types were placed in this encounter.    No orders of the defined types were placed in this encounter.    Patient Instructions  - continue ASA and lipitor for stroke prevention - continue BP meds for better BP control - check BP at home. - Follow up with your primary  care physician for stroke risk factor modification. Recommend maintain blood pressure goal <130/80, diabetes with hemoglobin A1c goal below 6.5% and lipids with LDL cholesterol goal below 70 mg/dL.  - continue home exercise of right hand. - follow up as needed.     Rosalin Hawking, MD PhD [facility] [address] [place], [place] [address] [phone]

## 2015-08-26 NOTE — Patient Instructions (Addendum)
-   continue ASA and lipitor for stroke prevention - continue BP meds for better BP control - check BP at home. - Follow up with your primary care physician for stroke risk factor modification. Recommend maintain blood pressure goal <130/80, diabetes with hemoglobin A1c goal below 6.5% and lipids with LDL cholesterol goal below 70 mg/dL.  - continue home exercise of right hand. - follow up as needed.

## 2015-08-28 DIAGNOSIS — E785 Hyperlipidemia, unspecified: Secondary | ICD-10-CM | POA: Diagnosis not present

## 2015-08-28 DIAGNOSIS — I119 Hypertensive heart disease without heart failure: Secondary | ICD-10-CM | POA: Diagnosis not present

## 2015-08-28 DIAGNOSIS — R69 Illness, unspecified: Secondary | ICD-10-CM | POA: Diagnosis not present

## 2015-08-28 DIAGNOSIS — I6990 Unspecified sequelae of unspecified cerebrovascular disease: Secondary | ICD-10-CM | POA: Diagnosis not present

## 2015-08-28 DIAGNOSIS — R7302 Impaired glucose tolerance (oral): Secondary | ICD-10-CM | POA: Diagnosis not present

## 2015-08-28 DIAGNOSIS — R740 Nonspecific elevation of levels of transaminase and lactic acid dehydrogenase [LDH]: Secondary | ICD-10-CM | POA: Diagnosis not present

## 2015-08-28 DIAGNOSIS — Z79899 Other long term (current) drug therapy: Secondary | ICD-10-CM | POA: Diagnosis not present

## 2015-09-18 ENCOUNTER — Telehealth: Payer: Self-pay | Admitting: Neurology

## 2015-09-18 NOTE — Telephone Encounter (Signed)
Patient called to advise, Dr. Erlinda Hong decreased metoprolol (LOPRESSOR) 50 MG tablet to 1 pill and now his Dystolic BP was 97 a couple of days ago, today 93.

## 2015-09-18 NOTE — Telephone Encounter (Signed)
LFt vm for patients sister who is on the [facility] list. Dr.Xu gave advice on the lopressor medication.Pt was suppose to follow up with his PCP about the lopressor dosage and BP.

## 2015-09-28 DIAGNOSIS — R69 Illness, unspecified: Secondary | ICD-10-CM | POA: Diagnosis not present

## 2015-11-13 ENCOUNTER — Other Ambulatory Visit (HOSPITAL_COMMUNITY): Payer: Self-pay | Admitting: Pulmonary Disease

## 2015-11-13 DIAGNOSIS — J209 Acute bronchitis, unspecified: Secondary | ICD-10-CM | POA: Diagnosis not present

## 2015-11-13 DIAGNOSIS — R7302 Impaired glucose tolerance (oral): Secondary | ICD-10-CM | POA: Diagnosis not present

## 2015-11-13 DIAGNOSIS — Z8371 Family history of colonic polyps: Secondary | ICD-10-CM | POA: Diagnosis not present

## 2015-11-13 DIAGNOSIS — R911 Solitary pulmonary nodule: Secondary | ICD-10-CM | POA: Diagnosis not present

## 2015-11-13 DIAGNOSIS — I119 Hypertensive heart disease without heart failure: Secondary | ICD-10-CM | POA: Diagnosis not present

## 2015-11-13 DIAGNOSIS — I6990 Unspecified sequelae of unspecified cerebrovascular disease: Secondary | ICD-10-CM | POA: Diagnosis not present

## 2015-11-13 DIAGNOSIS — R05 Cough: Secondary | ICD-10-CM | POA: Diagnosis not present

## 2015-11-13 DIAGNOSIS — Z79899 Other long term (current) drug therapy: Secondary | ICD-10-CM | POA: Diagnosis not present

## 2015-11-13 DIAGNOSIS — E785 Hyperlipidemia, unspecified: Secondary | ICD-10-CM | POA: Diagnosis not present

## 2015-11-13 DIAGNOSIS — K5900 Constipation, unspecified: Secondary | ICD-10-CM | POA: Diagnosis not present

## 2015-11-20 ENCOUNTER — Ambulatory Visit (HOSPITAL_COMMUNITY): Payer: Medicare HMO

## 2015-11-20 ENCOUNTER — Other Ambulatory Visit (HOSPITAL_COMMUNITY): Payer: Self-pay | Admitting: Pulmonary Disease

## 2015-11-20 ENCOUNTER — Ambulatory Visit (HOSPITAL_COMMUNITY)
Admission: RE | Admit: 2015-11-20 | Discharge: 2015-11-20 | Disposition: A | Discharge status: Home / Self Care | Payer: Medicare HMO | Source: Ambulatory Visit | Attending: Pulmonary Disease | Admitting: Pulmonary Disease

## 2015-11-20 DIAGNOSIS — R911 Solitary pulmonary nodule: Secondary | ICD-10-CM | POA: Insufficient documentation

## 2016-01-05 DIAGNOSIS — I119 Hypertensive heart disease without heart failure: Secondary | ICD-10-CM | POA: Diagnosis not present

## 2016-01-06 DIAGNOSIS — I119 Hypertensive heart disease without heart failure: Secondary | ICD-10-CM | POA: Diagnosis not present

## 2016-01-07 DIAGNOSIS — I119 Hypertensive heart disease without heart failure: Secondary | ICD-10-CM | POA: Diagnosis not present

## 2016-01-08 DIAGNOSIS — I119 Hypertensive heart disease without heart failure: Secondary | ICD-10-CM | POA: Diagnosis not present

## 2016-01-09 DIAGNOSIS — I119 Hypertensive heart disease without heart failure: Secondary | ICD-10-CM | POA: Diagnosis not present

## 2016-01-12 DIAGNOSIS — I119 Hypertensive heart disease without heart failure: Secondary | ICD-10-CM | POA: Diagnosis not present

## 2016-01-13 DIAGNOSIS — I119 Hypertensive heart disease without heart failure: Secondary | ICD-10-CM | POA: Diagnosis not present

## 2016-01-14 DIAGNOSIS — I119 Hypertensive heart disease without heart failure: Secondary | ICD-10-CM | POA: Diagnosis not present

## 2016-01-15 DIAGNOSIS — I119 Hypertensive heart disease without heart failure: Secondary | ICD-10-CM | POA: Diagnosis not present

## 2016-01-16 DIAGNOSIS — I119 Hypertensive heart disease without heart failure: Secondary | ICD-10-CM | POA: Diagnosis not present

## 2016-01-19 DIAGNOSIS — I119 Hypertensive heart disease without heart failure: Secondary | ICD-10-CM | POA: Diagnosis not present

## 2016-01-20 DIAGNOSIS — I119 Hypertensive heart disease without heart failure: Secondary | ICD-10-CM | POA: Diagnosis not present

## 2016-01-21 DIAGNOSIS — I119 Hypertensive heart disease without heart failure: Secondary | ICD-10-CM | POA: Diagnosis not present

## 2016-01-22 DIAGNOSIS — I119 Hypertensive heart disease without heart failure: Secondary | ICD-10-CM | POA: Diagnosis not present

## 2016-01-23 DIAGNOSIS — I119 Hypertensive heart disease without heart failure: Secondary | ICD-10-CM | POA: Diagnosis not present

## 2016-01-26 DIAGNOSIS — I119 Hypertensive heart disease without heart failure: Secondary | ICD-10-CM | POA: Diagnosis not present

## 2016-01-27 DIAGNOSIS — I119 Hypertensive heart disease without heart failure: Secondary | ICD-10-CM | POA: Diagnosis not present

## 2016-01-28 DIAGNOSIS — I119 Hypertensive heart disease without heart failure: Secondary | ICD-10-CM | POA: Diagnosis not present

## 2016-01-29 DIAGNOSIS — I119 Hypertensive heart disease without heart failure: Secondary | ICD-10-CM | POA: Diagnosis not present

## 2016-01-30 DIAGNOSIS — I119 Hypertensive heart disease without heart failure: Secondary | ICD-10-CM | POA: Diagnosis not present

## 2016-02-02 DIAGNOSIS — I119 Hypertensive heart disease without heart failure: Secondary | ICD-10-CM | POA: Diagnosis not present

## 2016-02-03 DIAGNOSIS — I119 Hypertensive heart disease without heart failure: Secondary | ICD-10-CM | POA: Diagnosis not present

## 2016-02-04 DIAGNOSIS — I119 Hypertensive heart disease without heart failure: Secondary | ICD-10-CM | POA: Diagnosis not present

## 2016-02-05 DIAGNOSIS — I119 Hypertensive heart disease without heart failure: Secondary | ICD-10-CM | POA: Diagnosis not present

## 2016-02-06 DIAGNOSIS — I119 Hypertensive heart disease without heart failure: Secondary | ICD-10-CM | POA: Diagnosis not present

## 2016-02-09 DIAGNOSIS — I119 Hypertensive heart disease without heart failure: Secondary | ICD-10-CM | POA: Diagnosis not present

## 2016-02-10 DIAGNOSIS — I119 Hypertensive heart disease without heart failure: Secondary | ICD-10-CM | POA: Diagnosis not present

## 2016-02-11 DIAGNOSIS — I119 Hypertensive heart disease without heart failure: Secondary | ICD-10-CM | POA: Diagnosis not present

## 2016-02-12 DIAGNOSIS — I119 Hypertensive heart disease without heart failure: Secondary | ICD-10-CM | POA: Diagnosis not present

## 2016-02-13 DIAGNOSIS — I119 Hypertensive heart disease without heart failure: Secondary | ICD-10-CM | POA: Diagnosis not present

## 2016-02-16 DIAGNOSIS — I119 Hypertensive heart disease without heart failure: Secondary | ICD-10-CM | POA: Diagnosis not present

## 2016-02-17 DIAGNOSIS — I119 Hypertensive heart disease without heart failure: Secondary | ICD-10-CM | POA: Diagnosis not present

## 2016-02-18 DIAGNOSIS — I119 Hypertensive heart disease without heart failure: Secondary | ICD-10-CM | POA: Diagnosis not present

## 2016-02-19 DIAGNOSIS — Z79899 Other long term (current) drug therapy: Secondary | ICD-10-CM | POA: Diagnosis not present

## 2016-02-19 DIAGNOSIS — Z86010 Personal history of colonic polyps: Secondary | ICD-10-CM | POA: Diagnosis not present

## 2016-02-19 DIAGNOSIS — E876 Hypokalemia: Secondary | ICD-10-CM | POA: Diagnosis not present

## 2016-02-19 DIAGNOSIS — I6990 Unspecified sequelae of unspecified cerebrovascular disease: Secondary | ICD-10-CM | POA: Diagnosis not present

## 2016-02-19 DIAGNOSIS — I119 Hypertensive heart disease without heart failure: Secondary | ICD-10-CM | POA: Diagnosis not present

## 2016-02-19 DIAGNOSIS — Z79891 Long term (current) use of opiate analgesic: Secondary | ICD-10-CM | POA: Diagnosis not present

## 2016-02-19 DIAGNOSIS — J209 Acute bronchitis, unspecified: Secondary | ICD-10-CM | POA: Diagnosis not present

## 2016-02-19 DIAGNOSIS — E785 Hyperlipidemia, unspecified: Secondary | ICD-10-CM | POA: Diagnosis not present

## 2016-02-19 DIAGNOSIS — K649 Unspecified hemorrhoids: Secondary | ICD-10-CM | POA: Diagnosis not present

## 2016-02-19 DIAGNOSIS — Z125 Encounter for screening for malignant neoplasm of prostate: Secondary | ICD-10-CM | POA: Diagnosis not present

## 2016-02-19 DIAGNOSIS — R7302 Impaired glucose tolerance (oral): Secondary | ICD-10-CM | POA: Diagnosis not present

## 2016-02-20 DIAGNOSIS — I119 Hypertensive heart disease without heart failure: Secondary | ICD-10-CM | POA: Diagnosis not present

## 2016-02-23 DIAGNOSIS — I119 Hypertensive heart disease without heart failure: Secondary | ICD-10-CM | POA: Diagnosis not present

## 2016-02-24 DIAGNOSIS — I119 Hypertensive heart disease without heart failure: Secondary | ICD-10-CM | POA: Diagnosis not present

## 2016-02-25 DIAGNOSIS — I119 Hypertensive heart disease without heart failure: Secondary | ICD-10-CM | POA: Diagnosis not present

## 2016-02-26 DIAGNOSIS — I119 Hypertensive heart disease without heart failure: Secondary | ICD-10-CM | POA: Diagnosis not present

## 2016-02-27 DIAGNOSIS — I119 Hypertensive heart disease without heart failure: Secondary | ICD-10-CM | POA: Diagnosis not present

## 2016-03-01 DIAGNOSIS — I119 Hypertensive heart disease without heart failure: Secondary | ICD-10-CM | POA: Diagnosis not present

## 2016-03-02 DIAGNOSIS — I119 Hypertensive heart disease without heart failure: Secondary | ICD-10-CM | POA: Diagnosis not present

## 2016-03-03 DIAGNOSIS — I119 Hypertensive heart disease without heart failure: Secondary | ICD-10-CM | POA: Diagnosis not present

## 2016-03-03 IMAGING — DX DG CHEST 2V
2 series · 2 of 2 positions shown · non-contrast
Comparison: None

CLINICAL DATA: Stroke protocol.

EXAM:
CHEST  2 VIEW

[chest ap]
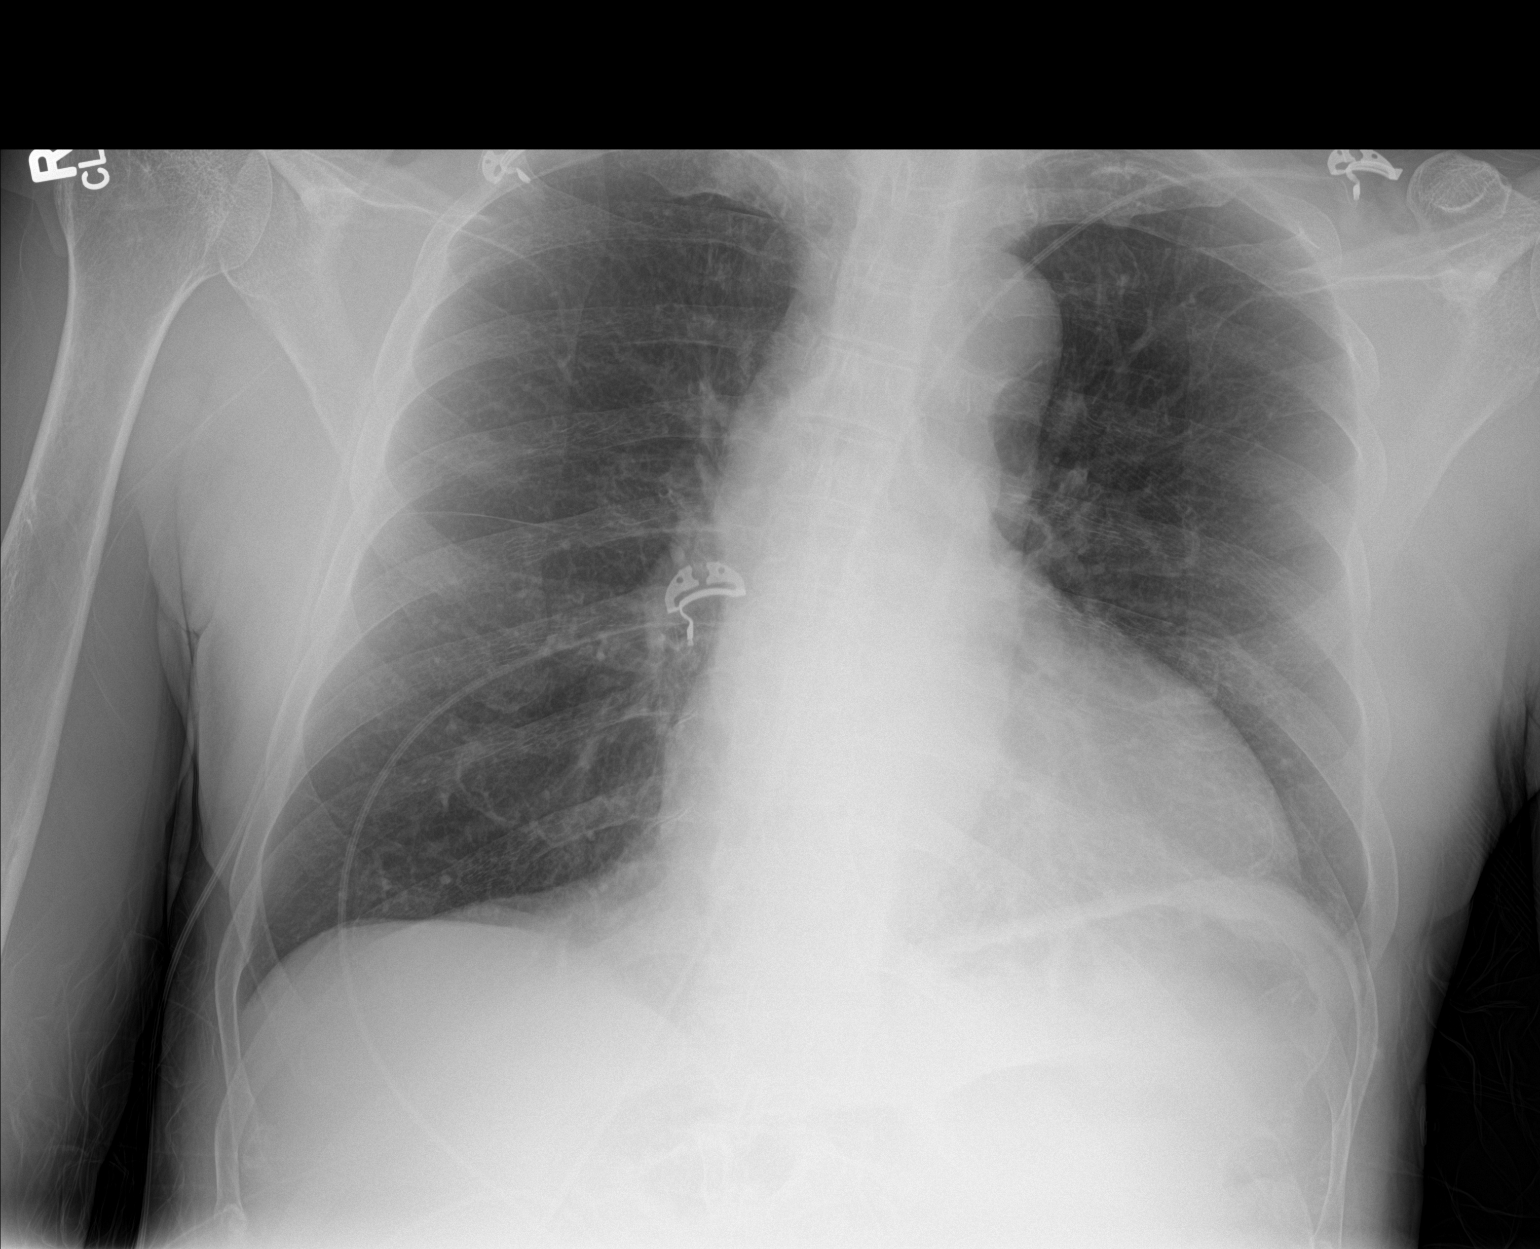

[chest lat]
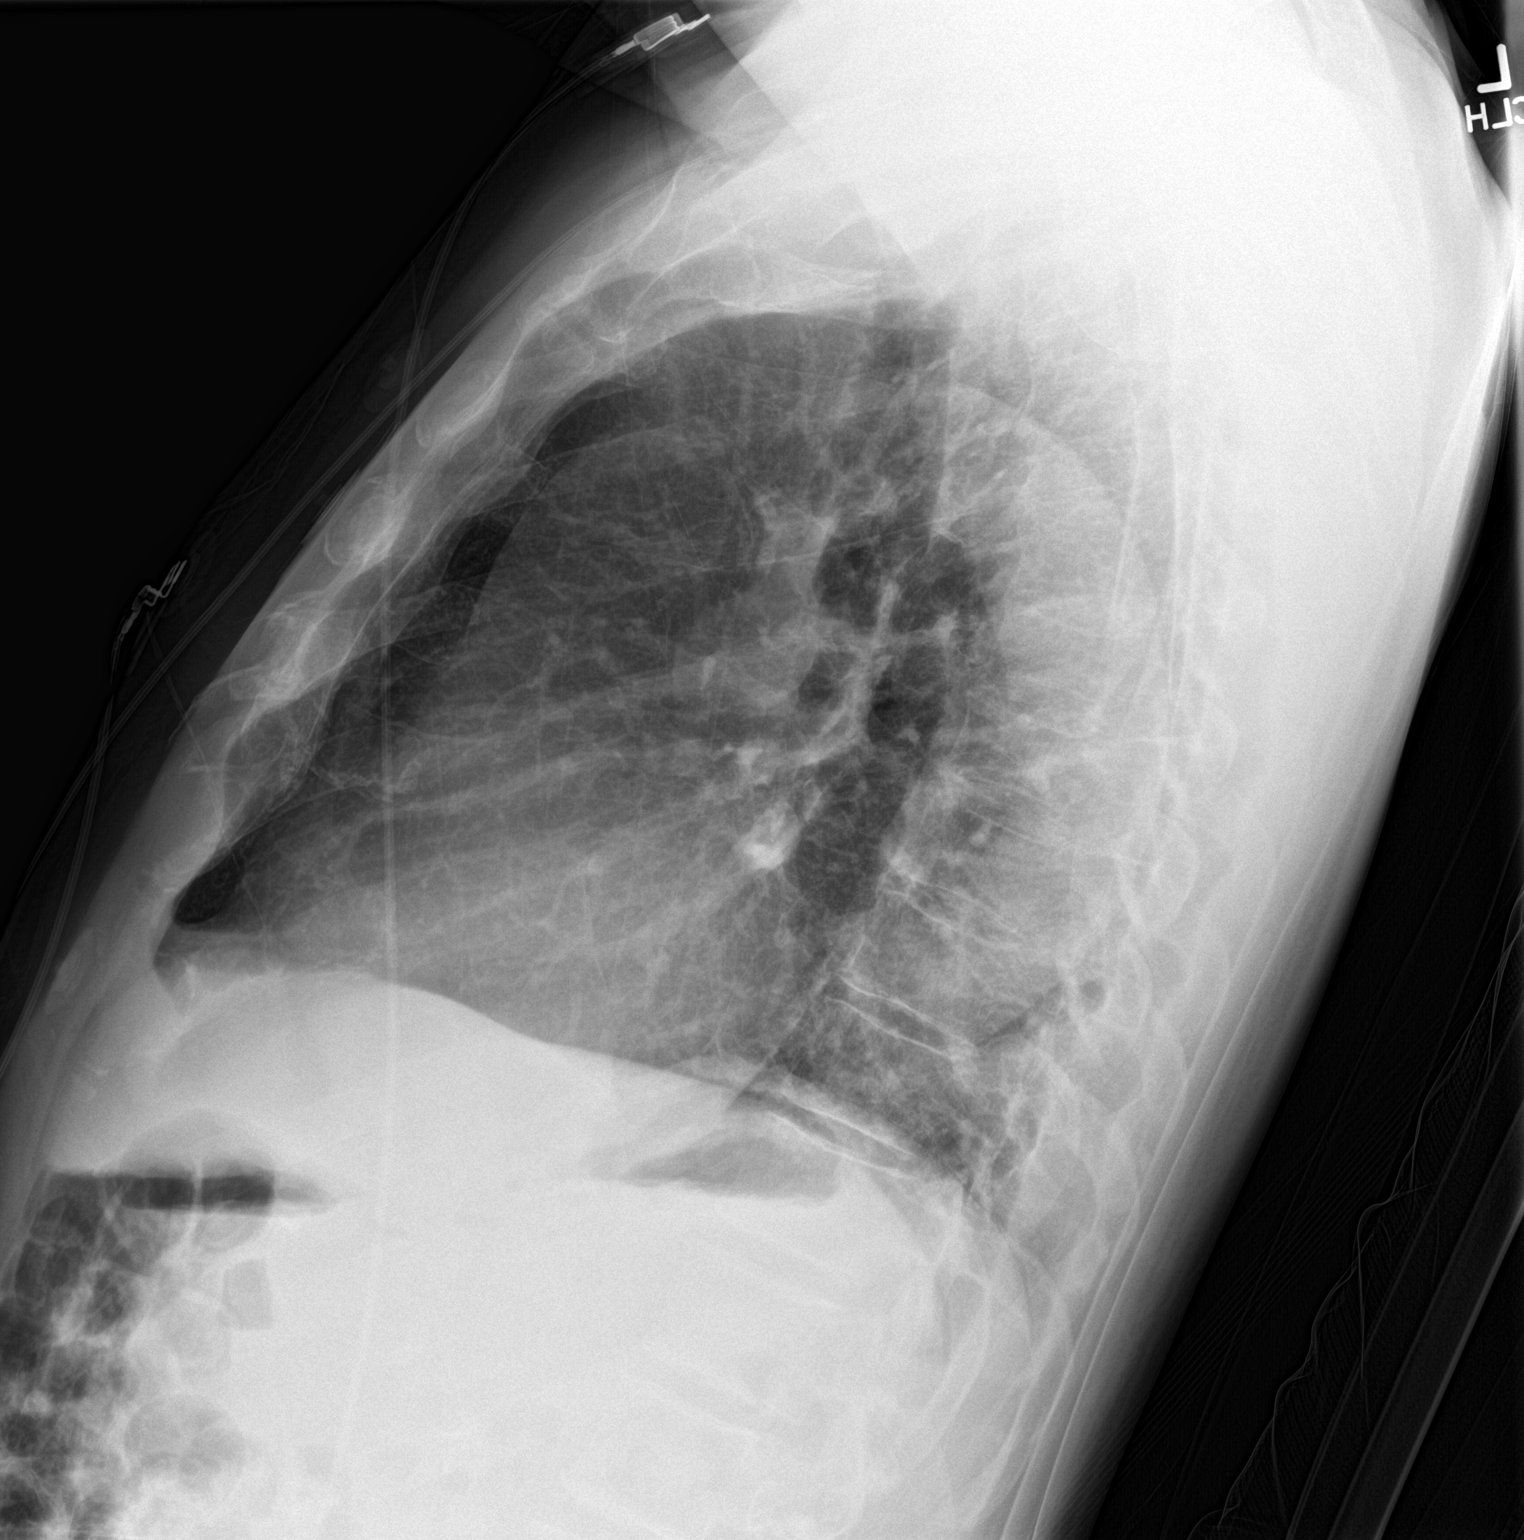

[2 of 2 positions shown; findings below may reference images not displayed]

FINDINGS: There is normal heart size. No pleural effusion or edema. No
airspace consolidation or atelectasis noted. Calcified
atherosclerotic plaque involves the thoracic aorta.
IMPRESSION: No active cardiopulmonary abnormalities.

## 2016-03-04 DIAGNOSIS — I119 Hypertensive heart disease without heart failure: Secondary | ICD-10-CM | POA: Diagnosis not present

## 2016-03-05 DIAGNOSIS — I119 Hypertensive heart disease without heart failure: Secondary | ICD-10-CM | POA: Diagnosis not present

## 2016-03-08 DIAGNOSIS — I119 Hypertensive heart disease without heart failure: Secondary | ICD-10-CM | POA: Diagnosis not present

## 2016-03-09 DIAGNOSIS — I119 Hypertensive heart disease without heart failure: Secondary | ICD-10-CM | POA: Diagnosis not present

## 2016-03-10 DIAGNOSIS — I119 Hypertensive heart disease without heart failure: Secondary | ICD-10-CM | POA: Diagnosis not present

## 2016-03-11 DIAGNOSIS — I119 Hypertensive heart disease without heart failure: Secondary | ICD-10-CM | POA: Diagnosis not present

## 2016-03-12 DIAGNOSIS — I119 Hypertensive heart disease without heart failure: Secondary | ICD-10-CM | POA: Diagnosis not present

## 2016-03-15 DIAGNOSIS — I119 Hypertensive heart disease without heart failure: Secondary | ICD-10-CM | POA: Diagnosis not present

## 2016-03-16 DIAGNOSIS — I119 Hypertensive heart disease without heart failure: Secondary | ICD-10-CM | POA: Diagnosis not present

## 2016-03-17 DIAGNOSIS — I119 Hypertensive heart disease without heart failure: Secondary | ICD-10-CM | POA: Diagnosis not present

## 2016-03-18 DIAGNOSIS — I119 Hypertensive heart disease without heart failure: Secondary | ICD-10-CM | POA: Diagnosis not present

## 2016-03-19 DIAGNOSIS — I119 Hypertensive heart disease without heart failure: Secondary | ICD-10-CM | POA: Diagnosis not present

## 2016-03-22 DIAGNOSIS — I119 Hypertensive heart disease without heart failure: Secondary | ICD-10-CM | POA: Diagnosis not present

## 2016-03-23 DIAGNOSIS — I119 Hypertensive heart disease without heart failure: Secondary | ICD-10-CM | POA: Diagnosis not present

## 2016-03-24 DIAGNOSIS — I119 Hypertensive heart disease without heart failure: Secondary | ICD-10-CM | POA: Diagnosis not present

## 2016-03-25 DIAGNOSIS — I119 Hypertensive heart disease without heart failure: Secondary | ICD-10-CM | POA: Diagnosis not present

## 2016-03-26 DIAGNOSIS — I119 Hypertensive heart disease without heart failure: Secondary | ICD-10-CM | POA: Diagnosis not present

## 2016-03-29 DIAGNOSIS — I119 Hypertensive heart disease without heart failure: Secondary | ICD-10-CM | POA: Diagnosis not present

## 2016-03-30 DIAGNOSIS — I119 Hypertensive heart disease without heart failure: Secondary | ICD-10-CM | POA: Diagnosis not present

## 2016-03-31 DIAGNOSIS — I119 Hypertensive heart disease without heart failure: Secondary | ICD-10-CM | POA: Diagnosis not present

## 2016-04-01 DIAGNOSIS — I119 Hypertensive heart disease without heart failure: Secondary | ICD-10-CM | POA: Diagnosis not present

## 2016-04-02 DIAGNOSIS — I119 Hypertensive heart disease without heart failure: Secondary | ICD-10-CM | POA: Diagnosis not present

## 2016-04-05 DIAGNOSIS — I119 Hypertensive heart disease without heart failure: Secondary | ICD-10-CM | POA: Diagnosis not present

## 2016-04-06 DIAGNOSIS — I119 Hypertensive heart disease without heart failure: Secondary | ICD-10-CM | POA: Diagnosis not present

## 2016-04-07 DIAGNOSIS — I119 Hypertensive heart disease without heart failure: Secondary | ICD-10-CM | POA: Diagnosis not present

## 2016-04-08 DIAGNOSIS — I119 Hypertensive heart disease without heart failure: Secondary | ICD-10-CM | POA: Diagnosis not present

## 2016-04-09 DIAGNOSIS — I119 Hypertensive heart disease without heart failure: Secondary | ICD-10-CM | POA: Diagnosis not present

## 2016-04-12 DIAGNOSIS — I119 Hypertensive heart disease without heart failure: Secondary | ICD-10-CM | POA: Diagnosis not present

## 2016-04-13 DIAGNOSIS — I119 Hypertensive heart disease without heart failure: Secondary | ICD-10-CM | POA: Diagnosis not present

## 2016-04-14 DIAGNOSIS — I119 Hypertensive heart disease without heart failure: Secondary | ICD-10-CM | POA: Diagnosis not present

## 2016-04-15 DIAGNOSIS — I119 Hypertensive heart disease without heart failure: Secondary | ICD-10-CM | POA: Diagnosis not present

## 2016-04-16 DIAGNOSIS — I119 Hypertensive heart disease without heart failure: Secondary | ICD-10-CM | POA: Diagnosis not present

## 2016-04-19 DIAGNOSIS — I119 Hypertensive heart disease without heart failure: Secondary | ICD-10-CM | POA: Diagnosis not present

## 2016-04-20 DIAGNOSIS — I119 Hypertensive heart disease without heart failure: Secondary | ICD-10-CM | POA: Diagnosis not present

## 2016-04-21 DIAGNOSIS — I119 Hypertensive heart disease without heart failure: Secondary | ICD-10-CM | POA: Diagnosis not present

## 2016-04-22 DIAGNOSIS — Z23 Encounter for immunization: Secondary | ICD-10-CM | POA: Diagnosis not present

## 2016-04-22 DIAGNOSIS — I119 Hypertensive heart disease without heart failure: Secondary | ICD-10-CM | POA: Diagnosis not present

## 2016-04-23 DIAGNOSIS — I119 Hypertensive heart disease without heart failure: Secondary | ICD-10-CM | POA: Diagnosis not present

## 2016-04-26 DIAGNOSIS — I119 Hypertensive heart disease without heart failure: Secondary | ICD-10-CM | POA: Diagnosis not present

## 2016-04-27 DIAGNOSIS — I119 Hypertensive heart disease without heart failure: Secondary | ICD-10-CM | POA: Diagnosis not present

## 2016-04-28 DIAGNOSIS — I119 Hypertensive heart disease without heart failure: Secondary | ICD-10-CM | POA: Diagnosis not present

## 2016-04-29 DIAGNOSIS — I119 Hypertensive heart disease without heart failure: Secondary | ICD-10-CM | POA: Diagnosis not present

## 2016-04-30 DIAGNOSIS — I119 Hypertensive heart disease without heart failure: Secondary | ICD-10-CM | POA: Diagnosis not present

## 2016-05-03 DIAGNOSIS — I119 Hypertensive heart disease without heart failure: Secondary | ICD-10-CM | POA: Diagnosis not present

## 2016-05-04 DIAGNOSIS — I119 Hypertensive heart disease without heart failure: Secondary | ICD-10-CM | POA: Diagnosis not present

## 2016-05-05 DIAGNOSIS — I119 Hypertensive heart disease without heart failure: Secondary | ICD-10-CM | POA: Diagnosis not present

## 2016-05-06 DIAGNOSIS — I119 Hypertensive heart disease without heart failure: Secondary | ICD-10-CM | POA: Diagnosis not present

## 2016-05-07 DIAGNOSIS — I119 Hypertensive heart disease without heart failure: Secondary | ICD-10-CM | POA: Diagnosis not present

## 2016-05-10 DIAGNOSIS — I119 Hypertensive heart disease without heart failure: Secondary | ICD-10-CM | POA: Diagnosis not present

## 2016-05-11 DIAGNOSIS — I119 Hypertensive heart disease without heart failure: Secondary | ICD-10-CM | POA: Diagnosis not present

## 2016-05-12 DIAGNOSIS — I119 Hypertensive heart disease without heart failure: Secondary | ICD-10-CM | POA: Diagnosis not present

## 2016-05-13 DIAGNOSIS — I119 Hypertensive heart disease without heart failure: Secondary | ICD-10-CM | POA: Diagnosis not present

## 2016-05-14 DIAGNOSIS — I119 Hypertensive heart disease without heart failure: Secondary | ICD-10-CM | POA: Diagnosis not present

## 2016-05-17 DIAGNOSIS — I119 Hypertensive heart disease without heart failure: Secondary | ICD-10-CM | POA: Diagnosis not present

## 2016-05-18 DIAGNOSIS — I119 Hypertensive heart disease without heart failure: Secondary | ICD-10-CM | POA: Diagnosis not present

## 2016-05-19 DIAGNOSIS — I119 Hypertensive heart disease without heart failure: Secondary | ICD-10-CM | POA: Diagnosis not present

## 2016-05-20 DIAGNOSIS — I119 Hypertensive heart disease without heart failure: Secondary | ICD-10-CM | POA: Diagnosis not present

## 2016-05-21 DIAGNOSIS — I119 Hypertensive heart disease without heart failure: Secondary | ICD-10-CM | POA: Diagnosis not present

## 2016-05-24 DIAGNOSIS — I119 Hypertensive heart disease without heart failure: Secondary | ICD-10-CM | POA: Diagnosis not present

## 2016-05-25 DIAGNOSIS — I119 Hypertensive heart disease without heart failure: Secondary | ICD-10-CM | POA: Diagnosis not present

## 2016-05-26 DIAGNOSIS — I119 Hypertensive heart disease without heart failure: Secondary | ICD-10-CM | POA: Diagnosis not present

## 2016-05-27 ENCOUNTER — Other Ambulatory Visit (HOSPITAL_COMMUNITY): Payer: Self-pay | Admitting: Pulmonary Disease

## 2016-05-27 DIAGNOSIS — R7302 Impaired glucose tolerance (oral): Secondary | ICD-10-CM | POA: Diagnosis not present

## 2016-05-27 DIAGNOSIS — I6990 Unspecified sequelae of unspecified cerebrovascular disease: Secondary | ICD-10-CM | POA: Diagnosis not present

## 2016-05-27 DIAGNOSIS — Z13818 Encounter for screening for other digestive system disorders: Secondary | ICD-10-CM | POA: Diagnosis not present

## 2016-05-27 DIAGNOSIS — Z8371 Family history of colonic polyps: Secondary | ICD-10-CM | POA: Diagnosis not present

## 2016-05-27 DIAGNOSIS — E876 Hypokalemia: Secondary | ICD-10-CM | POA: Diagnosis not present

## 2016-05-27 DIAGNOSIS — R911 Solitary pulmonary nodule: Secondary | ICD-10-CM | POA: Diagnosis not present

## 2016-05-27 DIAGNOSIS — Z87891 Personal history of nicotine dependence: Secondary | ICD-10-CM | POA: Diagnosis not present

## 2016-05-27 DIAGNOSIS — I639 Cerebral infarction, unspecified: Secondary | ICD-10-CM

## 2016-05-27 DIAGNOSIS — I119 Hypertensive heart disease without heart failure: Secondary | ICD-10-CM | POA: Diagnosis not present

## 2016-05-27 DIAGNOSIS — E785 Hyperlipidemia, unspecified: Secondary | ICD-10-CM | POA: Diagnosis not present

## 2016-05-27 DIAGNOSIS — Z79899 Other long term (current) drug therapy: Secondary | ICD-10-CM | POA: Diagnosis not present

## 2016-05-28 DIAGNOSIS — I119 Hypertensive heart disease without heart failure: Secondary | ICD-10-CM | POA: Diagnosis not present

## 2016-05-31 DIAGNOSIS — I119 Hypertensive heart disease without heart failure: Secondary | ICD-10-CM | POA: Diagnosis not present

## 2016-06-01 DIAGNOSIS — I119 Hypertensive heart disease without heart failure: Secondary | ICD-10-CM | POA: Diagnosis not present

## 2016-06-02 DIAGNOSIS — I119 Hypertensive heart disease without heart failure: Secondary | ICD-10-CM | POA: Diagnosis not present

## 2016-06-03 DIAGNOSIS — I119 Hypertensive heart disease without heart failure: Secondary | ICD-10-CM | POA: Diagnosis not present

## 2016-06-04 DIAGNOSIS — I119 Hypertensive heart disease without heart failure: Secondary | ICD-10-CM | POA: Diagnosis not present

## 2016-06-07 ENCOUNTER — Other Ambulatory Visit (HOSPITAL_COMMUNITY): Payer: Self-pay | Admitting: Pulmonary Disease

## 2016-06-07 ENCOUNTER — Ambulatory Visit (HOSPITAL_COMMUNITY)
Admission: RE | Admit: 2016-06-07 | Discharge: 2016-06-07 | Disposition: A | Discharge status: Home / Self Care | Payer: Medicare HMO | Source: Ambulatory Visit | Attending: Pulmonary Disease | Admitting: Pulmonary Disease

## 2016-06-07 DIAGNOSIS — Z136 Encounter for screening for cardiovascular disorders: Secondary | ICD-10-CM | POA: Diagnosis not present

## 2016-06-07 DIAGNOSIS — I77811 Abdominal aortic ectasia: Secondary | ICD-10-CM | POA: Insufficient documentation

## 2016-06-07 DIAGNOSIS — Z87891 Personal history of nicotine dependence: Secondary | ICD-10-CM

## 2016-06-07 DIAGNOSIS — Z8673 Personal history of transient ischemic attack (TIA), and cerebral infarction without residual deficits: Secondary | ICD-10-CM | POA: Diagnosis not present

## 2016-06-07 DIAGNOSIS — I119 Hypertensive heart disease without heart failure: Secondary | ICD-10-CM | POA: Diagnosis not present

## 2016-06-07 DIAGNOSIS — I639 Cerebral infarction, unspecified: Secondary | ICD-10-CM

## 2016-06-08 DIAGNOSIS — I119 Hypertensive heart disease without heart failure: Secondary | ICD-10-CM | POA: Diagnosis not present

## 2016-06-09 DIAGNOSIS — I119 Hypertensive heart disease without heart failure: Secondary | ICD-10-CM | POA: Diagnosis not present

## 2016-06-10 DIAGNOSIS — I119 Hypertensive heart disease without heart failure: Secondary | ICD-10-CM | POA: Diagnosis not present

## 2016-06-11 DIAGNOSIS — I119 Hypertensive heart disease without heart failure: Secondary | ICD-10-CM | POA: Diagnosis not present

## 2016-06-14 DIAGNOSIS — I119 Hypertensive heart disease without heart failure: Secondary | ICD-10-CM | POA: Diagnosis not present

## 2016-06-15 DIAGNOSIS — I119 Hypertensive heart disease without heart failure: Secondary | ICD-10-CM | POA: Diagnosis not present

## 2016-06-16 DIAGNOSIS — I119 Hypertensive heart disease without heart failure: Secondary | ICD-10-CM | POA: Diagnosis not present

## 2016-06-17 DIAGNOSIS — I119 Hypertensive heart disease without heart failure: Secondary | ICD-10-CM | POA: Diagnosis not present

## 2016-06-18 DIAGNOSIS — I119 Hypertensive heart disease without heart failure: Secondary | ICD-10-CM | POA: Diagnosis not present

## 2016-06-19 DIAGNOSIS — H5203 Hypermetropia, bilateral: Secondary | ICD-10-CM | POA: Diagnosis not present

## 2016-06-19 DIAGNOSIS — Z0101 Encounter for examination of eyes and vision with abnormal findings: Secondary | ICD-10-CM | POA: Diagnosis not present

## 2016-06-21 DIAGNOSIS — I119 Hypertensive heart disease without heart failure: Secondary | ICD-10-CM | POA: Diagnosis not present

## 2016-06-22 DIAGNOSIS — I119 Hypertensive heart disease without heart failure: Secondary | ICD-10-CM | POA: Diagnosis not present

## 2016-06-23 DIAGNOSIS — I119 Hypertensive heart disease without heart failure: Secondary | ICD-10-CM | POA: Diagnosis not present

## 2016-06-24 DIAGNOSIS — I119 Hypertensive heart disease without heart failure: Secondary | ICD-10-CM | POA: Diagnosis not present

## 2016-06-25 DIAGNOSIS — Z0101 Encounter for examination of eyes and vision with abnormal findings: Secondary | ICD-10-CM | POA: Diagnosis not present

## 2016-06-25 DIAGNOSIS — I119 Hypertensive heart disease without heart failure: Secondary | ICD-10-CM | POA: Diagnosis not present

## 2016-06-25 DIAGNOSIS — H5203 Hypermetropia, bilateral: Secondary | ICD-10-CM | POA: Diagnosis not present

## 2016-06-28 DIAGNOSIS — I119 Hypertensive heart disease without heart failure: Secondary | ICD-10-CM | POA: Diagnosis not present

## 2016-06-29 DIAGNOSIS — I119 Hypertensive heart disease without heart failure: Secondary | ICD-10-CM | POA: Diagnosis not present

## 2016-06-30 DIAGNOSIS — I119 Hypertensive heart disease without heart failure: Secondary | ICD-10-CM | POA: Diagnosis not present

## 2016-07-01 DIAGNOSIS — I119 Hypertensive heart disease without heart failure: Secondary | ICD-10-CM | POA: Diagnosis not present

## 2016-07-02 DIAGNOSIS — I119 Hypertensive heart disease without heart failure: Secondary | ICD-10-CM | POA: Diagnosis not present

## 2016-07-05 DIAGNOSIS — I119 Hypertensive heart disease without heart failure: Secondary | ICD-10-CM | POA: Diagnosis not present

## 2016-07-06 DIAGNOSIS — I119 Hypertensive heart disease without heart failure: Secondary | ICD-10-CM | POA: Diagnosis not present

## 2016-07-07 DIAGNOSIS — I119 Hypertensive heart disease without heart failure: Secondary | ICD-10-CM | POA: Diagnosis not present

## 2016-07-08 DIAGNOSIS — I119 Hypertensive heart disease without heart failure: Secondary | ICD-10-CM | POA: Diagnosis not present

## 2016-07-09 DIAGNOSIS — I119 Hypertensive heart disease without heart failure: Secondary | ICD-10-CM | POA: Diagnosis not present

## 2016-07-12 DIAGNOSIS — I119 Hypertensive heart disease without heart failure: Secondary | ICD-10-CM | POA: Diagnosis not present

## 2016-07-13 DIAGNOSIS — I119 Hypertensive heart disease without heart failure: Secondary | ICD-10-CM | POA: Diagnosis not present

## 2016-07-14 DIAGNOSIS — I119 Hypertensive heart disease without heart failure: Secondary | ICD-10-CM | POA: Diagnosis not present

## 2016-07-15 DIAGNOSIS — I119 Hypertensive heart disease without heart failure: Secondary | ICD-10-CM | POA: Diagnosis not present

## 2016-07-16 DIAGNOSIS — I119 Hypertensive heart disease without heart failure: Secondary | ICD-10-CM | POA: Diagnosis not present

## 2016-07-19 DIAGNOSIS — I119 Hypertensive heart disease without heart failure: Secondary | ICD-10-CM | POA: Diagnosis not present

## 2016-07-20 DIAGNOSIS — I119 Hypertensive heart disease without heart failure: Secondary | ICD-10-CM | POA: Diagnosis not present

## 2016-07-21 DIAGNOSIS — I119 Hypertensive heart disease without heart failure: Secondary | ICD-10-CM | POA: Diagnosis not present

## 2016-07-22 DIAGNOSIS — I119 Hypertensive heart disease without heart failure: Secondary | ICD-10-CM | POA: Diagnosis not present

## 2016-07-23 DIAGNOSIS — I119 Hypertensive heart disease without heart failure: Secondary | ICD-10-CM | POA: Diagnosis not present

## 2016-07-26 DIAGNOSIS — I119 Hypertensive heart disease without heart failure: Secondary | ICD-10-CM | POA: Diagnosis not present

## 2016-07-27 DIAGNOSIS — I119 Hypertensive heart disease without heart failure: Secondary | ICD-10-CM | POA: Diagnosis not present

## 2016-07-28 DIAGNOSIS — I119 Hypertensive heart disease without heart failure: Secondary | ICD-10-CM | POA: Diagnosis not present

## 2016-07-29 DIAGNOSIS — I119 Hypertensive heart disease without heart failure: Secondary | ICD-10-CM | POA: Diagnosis not present

## 2016-07-30 DIAGNOSIS — I119 Hypertensive heart disease without heart failure: Secondary | ICD-10-CM | POA: Diagnosis not present

## 2016-08-02 DIAGNOSIS — I119 Hypertensive heart disease without heart failure: Secondary | ICD-10-CM | POA: Diagnosis not present

## 2016-08-03 DIAGNOSIS — I119 Hypertensive heart disease without heart failure: Secondary | ICD-10-CM | POA: Diagnosis not present

## 2016-08-04 DIAGNOSIS — I119 Hypertensive heart disease without heart failure: Secondary | ICD-10-CM | POA: Diagnosis not present

## 2016-08-05 DIAGNOSIS — I119 Hypertensive heart disease without heart failure: Secondary | ICD-10-CM | POA: Diagnosis not present

## 2016-08-06 DIAGNOSIS — I119 Hypertensive heart disease without heart failure: Secondary | ICD-10-CM | POA: Diagnosis not present

## 2016-08-09 DIAGNOSIS — I119 Hypertensive heart disease without heart failure: Secondary | ICD-10-CM | POA: Diagnosis not present

## 2016-08-10 DIAGNOSIS — I119 Hypertensive heart disease without heart failure: Secondary | ICD-10-CM | POA: Diagnosis not present

## 2016-08-11 DIAGNOSIS — I119 Hypertensive heart disease without heart failure: Secondary | ICD-10-CM | POA: Diagnosis not present

## 2016-08-12 DIAGNOSIS — I119 Hypertensive heart disease without heart failure: Secondary | ICD-10-CM | POA: Diagnosis not present

## 2016-08-13 DIAGNOSIS — I119 Hypertensive heart disease without heart failure: Secondary | ICD-10-CM | POA: Diagnosis not present

## 2016-08-16 DIAGNOSIS — I119 Hypertensive heart disease without heart failure: Secondary | ICD-10-CM | POA: Diagnosis not present

## 2016-08-17 DIAGNOSIS — I119 Hypertensive heart disease without heart failure: Secondary | ICD-10-CM | POA: Diagnosis not present

## 2016-08-18 DIAGNOSIS — I119 Hypertensive heart disease without heart failure: Secondary | ICD-10-CM | POA: Diagnosis not present

## 2016-08-19 DIAGNOSIS — I119 Hypertensive heart disease without heart failure: Secondary | ICD-10-CM | POA: Diagnosis not present

## 2016-08-20 DIAGNOSIS — I119 Hypertensive heart disease without heart failure: Secondary | ICD-10-CM | POA: Diagnosis not present

## 2016-08-23 DIAGNOSIS — I119 Hypertensive heart disease without heart failure: Secondary | ICD-10-CM | POA: Diagnosis not present

## 2016-08-24 DIAGNOSIS — I119 Hypertensive heart disease without heart failure: Secondary | ICD-10-CM | POA: Diagnosis not present

## 2016-08-25 DIAGNOSIS — I119 Hypertensive heart disease without heart failure: Secondary | ICD-10-CM | POA: Diagnosis not present

## 2016-08-26 DIAGNOSIS — I119 Hypertensive heart disease without heart failure: Secondary | ICD-10-CM | POA: Diagnosis not present

## 2016-08-27 DIAGNOSIS — I119 Hypertensive heart disease without heart failure: Secondary | ICD-10-CM | POA: Diagnosis not present

## 2016-08-30 DIAGNOSIS — I119 Hypertensive heart disease without heart failure: Secondary | ICD-10-CM | POA: Diagnosis not present

## 2016-08-31 DIAGNOSIS — I119 Hypertensive heart disease without heart failure: Secondary | ICD-10-CM | POA: Diagnosis not present

## 2016-09-01 DIAGNOSIS — I119 Hypertensive heart disease without heart failure: Secondary | ICD-10-CM | POA: Diagnosis not present

## 2016-09-02 DIAGNOSIS — I119 Hypertensive heart disease without heart failure: Secondary | ICD-10-CM | POA: Diagnosis not present

## 2016-09-03 DIAGNOSIS — I119 Hypertensive heart disease without heart failure: Secondary | ICD-10-CM | POA: Diagnosis not present

## 2016-09-06 DIAGNOSIS — I119 Hypertensive heart disease without heart failure: Secondary | ICD-10-CM | POA: Diagnosis not present

## 2016-09-07 DIAGNOSIS — I119 Hypertensive heart disease without heart failure: Secondary | ICD-10-CM | POA: Diagnosis not present

## 2016-09-08 DIAGNOSIS — I119 Hypertensive heart disease without heart failure: Secondary | ICD-10-CM | POA: Diagnosis not present

## 2016-09-09 DIAGNOSIS — I119 Hypertensive heart disease without heart failure: Secondary | ICD-10-CM | POA: Diagnosis not present

## 2016-09-10 DIAGNOSIS — I119 Hypertensive heart disease without heart failure: Secondary | ICD-10-CM | POA: Diagnosis not present

## 2016-09-13 ENCOUNTER — Other Ambulatory Visit (HOSPITAL_COMMUNITY): Payer: Self-pay | Admitting: Pulmonary Disease

## 2016-09-13 DIAGNOSIS — K649 Unspecified hemorrhoids: Secondary | ICD-10-CM | POA: Diagnosis not present

## 2016-09-13 DIAGNOSIS — R7302 Impaired glucose tolerance (oral): Secondary | ICD-10-CM | POA: Diagnosis not present

## 2016-09-13 DIAGNOSIS — R911 Solitary pulmonary nodule: Secondary | ICD-10-CM

## 2016-09-13 DIAGNOSIS — Z79899 Other long term (current) drug therapy: Secondary | ICD-10-CM | POA: Diagnosis not present

## 2016-09-13 DIAGNOSIS — K929 Disease of digestive system, unspecified: Secondary | ICD-10-CM | POA: Diagnosis not present

## 2016-09-13 DIAGNOSIS — I119 Hypertensive heart disease without heart failure: Secondary | ICD-10-CM | POA: Diagnosis not present

## 2016-09-13 DIAGNOSIS — I6990 Unspecified sequelae of unspecified cerebrovascular disease: Secondary | ICD-10-CM | POA: Diagnosis not present

## 2016-09-13 DIAGNOSIS — E876 Hypokalemia: Secondary | ICD-10-CM | POA: Diagnosis not present

## 2016-09-13 DIAGNOSIS — E785 Hyperlipidemia, unspecified: Secondary | ICD-10-CM | POA: Diagnosis not present

## 2016-09-13 DIAGNOSIS — J410 Simple chronic bronchitis: Secondary | ICD-10-CM | POA: Diagnosis not present

## 2016-09-14 DIAGNOSIS — I119 Hypertensive heart disease without heart failure: Secondary | ICD-10-CM | POA: Diagnosis not present

## 2016-09-15 DIAGNOSIS — I119 Hypertensive heart disease without heart failure: Secondary | ICD-10-CM | POA: Diagnosis not present

## 2016-09-16 DIAGNOSIS — I119 Hypertensive heart disease without heart failure: Secondary | ICD-10-CM | POA: Diagnosis not present

## 2016-09-17 DIAGNOSIS — I119 Hypertensive heart disease without heart failure: Secondary | ICD-10-CM | POA: Diagnosis not present

## 2016-09-20 DIAGNOSIS — I119 Hypertensive heart disease without heart failure: Secondary | ICD-10-CM | POA: Diagnosis not present

## 2016-09-21 ENCOUNTER — Other Ambulatory Visit (HOSPITAL_COMMUNITY): Payer: Self-pay | Admitting: Pulmonary Disease

## 2016-09-21 DIAGNOSIS — I119 Hypertensive heart disease without heart failure: Secondary | ICD-10-CM | POA: Diagnosis not present

## 2016-09-21 DIAGNOSIS — Z09 Encounter for follow-up examination after completed treatment for conditions other than malignant neoplasm: Secondary | ICD-10-CM

## 2016-09-22 ENCOUNTER — Ambulatory Visit (HOSPITAL_COMMUNITY): Payer: Medicare HMO

## 2016-09-22 ENCOUNTER — Ambulatory Visit (HOSPITAL_COMMUNITY)
Admission: RE | Admit: 2016-09-22 | Discharge: 2016-09-22 | Disposition: A | Discharge status: Home / Self Care | Payer: Medicare HMO | Source: Ambulatory Visit | Attending: Pulmonary Disease | Admitting: Pulmonary Disease

## 2016-09-22 DIAGNOSIS — I119 Hypertensive heart disease without heart failure: Secondary | ICD-10-CM | POA: Diagnosis not present

## 2016-09-22 DIAGNOSIS — R911 Solitary pulmonary nodule: Secondary | ICD-10-CM

## 2016-09-23 DIAGNOSIS — I119 Hypertensive heart disease without heart failure: Secondary | ICD-10-CM | POA: Diagnosis not present

## 2016-09-24 ENCOUNTER — Telehealth: Payer: Self-pay | Admitting: Pulmonary Disease

## 2016-09-24 DIAGNOSIS — I119 Hypertensive heart disease without heart failure: Secondary | ICD-10-CM | POA: Diagnosis not present

## 2016-09-27 ENCOUNTER — Ambulatory Visit (HOSPITAL_COMMUNITY): Payer: Medicare HMO

## 2016-09-27 ENCOUNTER — Encounter (HOSPITAL_COMMUNITY): Payer: Self-pay

## 2016-09-27 DIAGNOSIS — I119 Hypertensive heart disease without heart failure: Secondary | ICD-10-CM | POA: Diagnosis not present

## 2016-09-28 DIAGNOSIS — I119 Hypertensive heart disease without heart failure: Secondary | ICD-10-CM | POA: Diagnosis not present

## 2016-09-29 DIAGNOSIS — I119 Hypertensive heart disease without heart failure: Secondary | ICD-10-CM | POA: Diagnosis not present

## 2016-09-30 ENCOUNTER — Ambulatory Visit (HOSPITAL_COMMUNITY): Payer: Medicare HMO

## 2016-09-30 ENCOUNTER — Ambulatory Visit (HOSPITAL_COMMUNITY)
Admission: RE | Admit: 2016-09-30 | Discharge: 2016-09-30 | Disposition: A | Discharge status: Home / Self Care | Payer: Medicare HMO | Source: Ambulatory Visit | Attending: Pulmonary Disease | Admitting: Pulmonary Disease

## 2016-09-30 DIAGNOSIS — Z09 Encounter for follow-up examination after completed treatment for conditions other than malignant neoplasm: Secondary | ICD-10-CM

## 2016-09-30 DIAGNOSIS — I119 Hypertensive heart disease without heart failure: Secondary | ICD-10-CM | POA: Diagnosis not present

## 2016-09-30 DIAGNOSIS — R911 Solitary pulmonary nodule: Secondary | ICD-10-CM | POA: Diagnosis not present

## 2016-10-01 DIAGNOSIS — I119 Hypertensive heart disease without heart failure: Secondary | ICD-10-CM | POA: Diagnosis not present

## 2016-10-04 DIAGNOSIS — I119 Hypertensive heart disease without heart failure: Secondary | ICD-10-CM | POA: Diagnosis not present

## 2016-10-05 DIAGNOSIS — I119 Hypertensive heart disease without heart failure: Secondary | ICD-10-CM | POA: Diagnosis not present

## 2016-10-06 DIAGNOSIS — I119 Hypertensive heart disease without heart failure: Secondary | ICD-10-CM | POA: Diagnosis not present

## 2016-10-07 DIAGNOSIS — I119 Hypertensive heart disease without heart failure: Secondary | ICD-10-CM | POA: Diagnosis not present

## 2016-10-08 DIAGNOSIS — I119 Hypertensive heart disease without heart failure: Secondary | ICD-10-CM | POA: Diagnosis not present

## 2016-10-11 DIAGNOSIS — I119 Hypertensive heart disease without heart failure: Secondary | ICD-10-CM | POA: Diagnosis not present

## 2016-10-12 DIAGNOSIS — I119 Hypertensive heart disease without heart failure: Secondary | ICD-10-CM | POA: Diagnosis not present

## 2016-10-13 DIAGNOSIS — I119 Hypertensive heart disease without heart failure: Secondary | ICD-10-CM | POA: Diagnosis not present

## 2016-10-14 DIAGNOSIS — I119 Hypertensive heart disease without heart failure: Secondary | ICD-10-CM | POA: Diagnosis not present

## 2016-10-15 DIAGNOSIS — I119 Hypertensive heart disease without heart failure: Secondary | ICD-10-CM | POA: Diagnosis not present

## 2016-10-18 DIAGNOSIS — I119 Hypertensive heart disease without heart failure: Secondary | ICD-10-CM | POA: Diagnosis not present

## 2016-10-19 DIAGNOSIS — I119 Hypertensive heart disease without heart failure: Secondary | ICD-10-CM | POA: Diagnosis not present

## 2016-10-20 DIAGNOSIS — I119 Hypertensive heart disease without heart failure: Secondary | ICD-10-CM | POA: Diagnosis not present

## 2016-10-21 DIAGNOSIS — I119 Hypertensive heart disease without heart failure: Secondary | ICD-10-CM | POA: Diagnosis not present

## 2016-10-22 DIAGNOSIS — I119 Hypertensive heart disease without heart failure: Secondary | ICD-10-CM | POA: Diagnosis not present

## 2016-10-25 DIAGNOSIS — I119 Hypertensive heart disease without heart failure: Secondary | ICD-10-CM | POA: Diagnosis not present

## 2016-10-26 DIAGNOSIS — I119 Hypertensive heart disease without heart failure: Secondary | ICD-10-CM | POA: Diagnosis not present

## 2016-10-27 DIAGNOSIS — I119 Hypertensive heart disease without heart failure: Secondary | ICD-10-CM | POA: Diagnosis not present

## 2016-10-28 DIAGNOSIS — I119 Hypertensive heart disease without heart failure: Secondary | ICD-10-CM | POA: Diagnosis not present

## 2016-10-29 DIAGNOSIS — I119 Hypertensive heart disease without heart failure: Secondary | ICD-10-CM | POA: Diagnosis not present

## 2016-11-01 DIAGNOSIS — I119 Hypertensive heart disease without heart failure: Secondary | ICD-10-CM | POA: Diagnosis not present

## 2016-11-02 DIAGNOSIS — I119 Hypertensive heart disease without heart failure: Secondary | ICD-10-CM | POA: Diagnosis not present

## 2016-11-03 DIAGNOSIS — I119 Hypertensive heart disease without heart failure: Secondary | ICD-10-CM | POA: Diagnosis not present

## 2016-11-04 DIAGNOSIS — I119 Hypertensive heart disease without heart failure: Secondary | ICD-10-CM | POA: Diagnosis not present

## 2016-11-05 DIAGNOSIS — I119 Hypertensive heart disease without heart failure: Secondary | ICD-10-CM | POA: Diagnosis not present

## 2016-11-08 DIAGNOSIS — I119 Hypertensive heart disease without heart failure: Secondary | ICD-10-CM | POA: Diagnosis not present

## 2016-11-09 DIAGNOSIS — I119 Hypertensive heart disease without heart failure: Secondary | ICD-10-CM | POA: Diagnosis not present

## 2016-11-10 DIAGNOSIS — I119 Hypertensive heart disease without heart failure: Secondary | ICD-10-CM | POA: Diagnosis not present

## 2016-11-11 DIAGNOSIS — I119 Hypertensive heart disease without heart failure: Secondary | ICD-10-CM | POA: Diagnosis not present

## 2016-11-12 DIAGNOSIS — I119 Hypertensive heart disease without heart failure: Secondary | ICD-10-CM | POA: Diagnosis not present

## 2016-11-15 DIAGNOSIS — I119 Hypertensive heart disease without heart failure: Secondary | ICD-10-CM | POA: Diagnosis not present

## 2016-11-16 DIAGNOSIS — I119 Hypertensive heart disease without heart failure: Secondary | ICD-10-CM | POA: Diagnosis not present

## 2016-11-17 DIAGNOSIS — I119 Hypertensive heart disease without heart failure: Secondary | ICD-10-CM | POA: Diagnosis not present

## 2016-11-18 DIAGNOSIS — I119 Hypertensive heart disease without heart failure: Secondary | ICD-10-CM | POA: Diagnosis not present

## 2016-11-19 DIAGNOSIS — I119 Hypertensive heart disease without heart failure: Secondary | ICD-10-CM | POA: Diagnosis not present

## 2016-11-20 DIAGNOSIS — I119 Hypertensive heart disease without heart failure: Secondary | ICD-10-CM | POA: Diagnosis not present

## 2016-11-21 DIAGNOSIS — I119 Hypertensive heart disease without heart failure: Secondary | ICD-10-CM | POA: Diagnosis not present

## 2016-11-22 DIAGNOSIS — I119 Hypertensive heart disease without heart failure: Secondary | ICD-10-CM | POA: Diagnosis not present

## 2016-11-23 DIAGNOSIS — I119 Hypertensive heart disease without heart failure: Secondary | ICD-10-CM | POA: Diagnosis not present

## 2016-11-24 DIAGNOSIS — I119 Hypertensive heart disease without heart failure: Secondary | ICD-10-CM | POA: Diagnosis not present

## 2016-11-25 DIAGNOSIS — I119 Hypertensive heart disease without heart failure: Secondary | ICD-10-CM | POA: Diagnosis not present

## 2016-11-26 DIAGNOSIS — I119 Hypertensive heart disease without heart failure: Secondary | ICD-10-CM | POA: Diagnosis not present

## 2016-11-27 DIAGNOSIS — I119 Hypertensive heart disease without heart failure: Secondary | ICD-10-CM | POA: Diagnosis not present

## 2016-11-28 DIAGNOSIS — I119 Hypertensive heart disease without heart failure: Secondary | ICD-10-CM | POA: Diagnosis not present

## 2016-12-14 DIAGNOSIS — R911 Solitary pulmonary nodule: Secondary | ICD-10-CM | POA: Diagnosis not present

## 2016-12-14 DIAGNOSIS — E784 Other hyperlipidemia: Secondary | ICD-10-CM | POA: Diagnosis not present

## 2016-12-14 DIAGNOSIS — J410 Simple chronic bronchitis: Secondary | ICD-10-CM | POA: Diagnosis not present

## 2016-12-14 DIAGNOSIS — I119 Hypertensive heart disease without heart failure: Secondary | ICD-10-CM | POA: Diagnosis not present

## 2016-12-14 DIAGNOSIS — Z8371 Family history of colonic polyps: Secondary | ICD-10-CM | POA: Diagnosis not present

## 2016-12-14 DIAGNOSIS — K649 Unspecified hemorrhoids: Secondary | ICD-10-CM | POA: Diagnosis not present

## 2016-12-14 DIAGNOSIS — I6990 Unspecified sequelae of unspecified cerebrovascular disease: Secondary | ICD-10-CM | POA: Diagnosis not present

## 2016-12-14 DIAGNOSIS — R7302 Impaired glucose tolerance (oral): Secondary | ICD-10-CM | POA: Diagnosis not present

## 2016-12-14 DIAGNOSIS — Z125 Encounter for screening for malignant neoplasm of prostate: Secondary | ICD-10-CM | POA: Diagnosis not present

## 2016-12-14 DIAGNOSIS — E785 Hyperlipidemia, unspecified: Secondary | ICD-10-CM | POA: Diagnosis not present

## 2016-12-14 DIAGNOSIS — Z79899 Other long term (current) drug therapy: Secondary | ICD-10-CM | POA: Diagnosis not present

## 2017-03-03 DIAGNOSIS — Z8371 Family history of colonic polyps: Secondary | ICD-10-CM | POA: Diagnosis not present

## 2017-03-03 DIAGNOSIS — K649 Unspecified hemorrhoids: Secondary | ICD-10-CM | POA: Diagnosis not present

## 2017-03-03 DIAGNOSIS — Z0001 Encounter for general adult medical examination with abnormal findings: Secondary | ICD-10-CM | POA: Diagnosis not present

## 2017-03-03 DIAGNOSIS — Z1211 Encounter for screening for malignant neoplasm of colon: Secondary | ICD-10-CM | POA: Diagnosis not present

## 2017-03-03 DIAGNOSIS — E785 Hyperlipidemia, unspecified: Secondary | ICD-10-CM | POA: Diagnosis not present

## 2017-03-03 DIAGNOSIS — Z79899 Other long term (current) drug therapy: Secondary | ICD-10-CM | POA: Diagnosis not present

## 2017-03-03 DIAGNOSIS — Z87891 Personal history of nicotine dependence: Secondary | ICD-10-CM | POA: Diagnosis not present

## 2017-03-03 DIAGNOSIS — E876 Hypokalemia: Secondary | ICD-10-CM | POA: Diagnosis not present

## 2017-03-03 DIAGNOSIS — R7302 Impaired glucose tolerance (oral): Secondary | ICD-10-CM | POA: Diagnosis not present

## 2017-03-03 DIAGNOSIS — I6990 Unspecified sequelae of unspecified cerebrovascular disease: Secondary | ICD-10-CM | POA: Diagnosis not present

## 2017-03-03 DIAGNOSIS — I119 Hypertensive heart disease without heart failure: Secondary | ICD-10-CM | POA: Diagnosis not present

## 2017-03-03 DIAGNOSIS — R911 Solitary pulmonary nodule: Secondary | ICD-10-CM | POA: Diagnosis not present

## 2017-04-08 DIAGNOSIS — R69 Illness, unspecified: Secondary | ICD-10-CM | POA: Diagnosis not present

## 2017-06-09 DIAGNOSIS — Z86010 Personal history of colonic polyps: Secondary | ICD-10-CM | POA: Diagnosis not present

## 2017-06-09 DIAGNOSIS — R911 Solitary pulmonary nodule: Secondary | ICD-10-CM | POA: Diagnosis not present

## 2017-06-09 DIAGNOSIS — I119 Hypertensive heart disease without heart failure: Secondary | ICD-10-CM | POA: Diagnosis not present

## 2017-06-09 DIAGNOSIS — E876 Hypokalemia: Secondary | ICD-10-CM | POA: Diagnosis not present

## 2017-06-09 DIAGNOSIS — K649 Unspecified hemorrhoids: Secondary | ICD-10-CM | POA: Diagnosis not present

## 2017-06-09 DIAGNOSIS — R7302 Impaired glucose tolerance (oral): Secondary | ICD-10-CM | POA: Diagnosis not present

## 2017-06-09 DIAGNOSIS — E785 Hyperlipidemia, unspecified: Secondary | ICD-10-CM | POA: Diagnosis not present

## 2017-06-09 DIAGNOSIS — Z87891 Personal history of nicotine dependence: Secondary | ICD-10-CM | POA: Diagnosis not present

## 2017-06-09 DIAGNOSIS — I6990 Unspecified sequelae of unspecified cerebrovascular disease: Secondary | ICD-10-CM | POA: Diagnosis not present

## 2017-06-09 DIAGNOSIS — Z79899 Other long term (current) drug therapy: Secondary | ICD-10-CM | POA: Diagnosis not present

## 2017-09-01 DIAGNOSIS — N400 Enlarged prostate without lower urinary tract symptoms: Secondary | ICD-10-CM | POA: Diagnosis not present

## 2017-09-01 DIAGNOSIS — K08409 Partial loss of teeth, unspecified cause, unspecified class: Secondary | ICD-10-CM | POA: Diagnosis not present

## 2017-09-01 DIAGNOSIS — I10 Essential (primary) hypertension: Secondary | ICD-10-CM | POA: Diagnosis not present

## 2017-09-01 DIAGNOSIS — H04129 Dry eye syndrome of unspecified lacrimal gland: Secondary | ICD-10-CM | POA: Diagnosis not present

## 2017-09-01 DIAGNOSIS — I69351 Hemiplegia and hemiparesis following cerebral infarction affecting right dominant side: Secondary | ICD-10-CM | POA: Diagnosis not present

## 2017-09-01 DIAGNOSIS — G8929 Other chronic pain: Secondary | ICD-10-CM | POA: Diagnosis not present

## 2017-09-01 DIAGNOSIS — E1151 Type 2 diabetes mellitus with diabetic peripheral angiopathy without gangrene: Secondary | ICD-10-CM | POA: Diagnosis not present

## 2017-09-01 DIAGNOSIS — K5900 Constipation, unspecified: Secondary | ICD-10-CM | POA: Diagnosis not present

## 2017-09-01 DIAGNOSIS — E785 Hyperlipidemia, unspecified: Secondary | ICD-10-CM | POA: Diagnosis not present

## 2017-09-01 DIAGNOSIS — R69 Illness, unspecified: Secondary | ICD-10-CM | POA: Diagnosis not present

## 2017-09-08 DIAGNOSIS — K649 Unspecified hemorrhoids: Secondary | ICD-10-CM | POA: Diagnosis not present

## 2017-09-08 DIAGNOSIS — Z87891 Personal history of nicotine dependence: Secondary | ICD-10-CM | POA: Diagnosis not present

## 2017-09-08 DIAGNOSIS — I119 Hypertensive heart disease without heart failure: Secondary | ICD-10-CM | POA: Diagnosis not present

## 2017-09-08 DIAGNOSIS — E876 Hypokalemia: Secondary | ICD-10-CM | POA: Diagnosis not present

## 2017-09-08 DIAGNOSIS — Z125 Encounter for screening for malignant neoplasm of prostate: Secondary | ICD-10-CM | POA: Diagnosis not present

## 2017-09-08 DIAGNOSIS — I6990 Unspecified sequelae of unspecified cerebrovascular disease: Secondary | ICD-10-CM | POA: Diagnosis not present

## 2017-09-08 DIAGNOSIS — R911 Solitary pulmonary nodule: Secondary | ICD-10-CM | POA: Diagnosis not present

## 2017-09-08 DIAGNOSIS — E785 Hyperlipidemia, unspecified: Secondary | ICD-10-CM | POA: Diagnosis not present

## 2017-09-08 DIAGNOSIS — R7302 Impaired glucose tolerance (oral): Secondary | ICD-10-CM | POA: Diagnosis not present

## 2017-09-08 DIAGNOSIS — Z8371 Family history of colonic polyps: Secondary | ICD-10-CM | POA: Diagnosis not present

## 2017-09-08 DIAGNOSIS — Z79899 Other long term (current) drug therapy: Secondary | ICD-10-CM | POA: Diagnosis not present

## 2017-10-15 DIAGNOSIS — H5203 Hypermetropia, bilateral: Secondary | ICD-10-CM | POA: Diagnosis not present

## 2017-10-15 DIAGNOSIS — Z0100 Encounter for examination of eyes and vision without abnormal findings: Secondary | ICD-10-CM | POA: Diagnosis not present

## 2017-12-08 DIAGNOSIS — Z86010 Personal history of colonic polyps: Secondary | ICD-10-CM | POA: Diagnosis not present

## 2017-12-08 DIAGNOSIS — I119 Hypertensive heart disease without heart failure: Secondary | ICD-10-CM | POA: Diagnosis not present

## 2017-12-08 DIAGNOSIS — E7841 Elevated Lipoprotein(a): Secondary | ICD-10-CM | POA: Diagnosis not present

## 2017-12-08 DIAGNOSIS — R911 Solitary pulmonary nodule: Secondary | ICD-10-CM | POA: Diagnosis not present

## 2017-12-08 DIAGNOSIS — Z79899 Other long term (current) drug therapy: Secondary | ICD-10-CM | POA: Diagnosis not present

## 2017-12-08 DIAGNOSIS — I6990 Unspecified sequelae of unspecified cerebrovascular disease: Secondary | ICD-10-CM | POA: Diagnosis not present

## 2017-12-08 DIAGNOSIS — R7302 Impaired glucose tolerance (oral): Secondary | ICD-10-CM | POA: Diagnosis not present

## 2017-12-08 DIAGNOSIS — E876 Hypokalemia: Secondary | ICD-10-CM | POA: Diagnosis not present

## 2017-12-08 DIAGNOSIS — Z87891 Personal history of nicotine dependence: Secondary | ICD-10-CM | POA: Diagnosis not present

## 2017-12-08 DIAGNOSIS — K649 Unspecified hemorrhoids: Secondary | ICD-10-CM | POA: Diagnosis not present

## 2018-02-01 ENCOUNTER — Ambulatory Visit (INDEPENDENT_AMBULATORY_CARE_PROVIDER_SITE_OTHER): Payer: Medicare HMO | Admitting: Orthopaedic Surgery

## 2018-02-20 ENCOUNTER — Encounter (INDEPENDENT_AMBULATORY_CARE_PROVIDER_SITE_OTHER): Payer: Self-pay | Admitting: Orthopaedic Surgery

## 2018-02-20 ENCOUNTER — Ambulatory Visit (INDEPENDENT_AMBULATORY_CARE_PROVIDER_SITE_OTHER): Payer: Medicare HMO | Admitting: Orthopaedic Surgery

## 2018-02-20 ENCOUNTER — Ambulatory Visit (INDEPENDENT_AMBULATORY_CARE_PROVIDER_SITE_OTHER): Payer: Medicare HMO

## 2018-02-20 DIAGNOSIS — G8929 Other chronic pain: Secondary | ICD-10-CM

## 2018-02-20 DIAGNOSIS — M25561 Pain in right knee: Secondary | ICD-10-CM

## 2018-02-20 DIAGNOSIS — M25562 Pain in left knee: Secondary | ICD-10-CM | POA: Diagnosis not present

## 2018-02-20 DIAGNOSIS — M1712 Unilateral primary osteoarthritis, left knee: Secondary | ICD-10-CM | POA: Diagnosis not present

## 2018-02-20 DIAGNOSIS — M1711 Unilateral primary osteoarthritis, right knee: Secondary | ICD-10-CM

## 2018-02-20 MED ORDER — METHYLPREDNISOLONE ACETATE 40 MG/ML IJ SUSP
40.0000 mg | INTRAMUSCULAR | Status: AC | PRN
  Administered 2018-02-20: 40 mg via INTRA_ARTICULAR

## 2018-02-20 MED ORDER — METHYLPREDNISOLONE ACETATE 40 MG/ML IJ SUSP
40.0000 mg | INTRAMUSCULAR | Status: AC | PRN
Start: 2018-02-20 — End: 2018-02-20
  Administered 2018-02-20: 40 mg via INTRA_ARTICULAR

## 2018-02-20 MED ORDER — LIDOCAINE HCL 1 % IJ SOLN
3.0000 mL | INTRAMUSCULAR | Status: AC | PRN
  Administered 2018-02-20: 3 mL

## 2018-02-20 NOTE — Progress Notes (Signed)
Office Visit Note   Patient: [name]           Date of Birth: [birth year]           MRN: [id number] Visit Date: 02/20/2018              Requested by: Vincente Liberty, MD [address] [place], [place] [address] PCP: Vincente Liberty, MD   Assessment & Plan: Visit Diagnoses:  1. Chronic pain of both knees   2. Unilateral primary osteoarthritis, right knee   3. Unilateral primary osteoarthritis, left knee     Plan: There is really no acute treatment plan for his knees other than considering knee replacement surgery in future.  He did wish to try steroid injections in both knees and I agree with this.  His arthritis is so severe that hyaluronic acid injections right and warranted.  I showed him a knee model to talk about knee replacement surgery.  This is something may consider in the future but he still feels like his recovery instruction in a compression and anything.  We will see him back in about 2 months to see how is doing overall.  All questions concerns were answered and addressed.  Follow-Up Instructions: Return in about 2 months (around 04/23/2018).   Orders:  Orders Placed This Encounter  Procedures  . Large Joint Inj  . Large Joint Inj  . XR Knee 1-2 Views Left  . XR Knee 1-2 Views Right   No orders of the defined types were placed in this encounter.     Procedures: Large Joint Inj: R knee on 02/20/2018 3:09 PM Indications: diagnostic evaluation and pain Details: 22 G 1.5 in needle, superolateral approach  Arthrogram: No  Medications: 3 mL lidocaine 1 %; 40 mg methylPREDNISolone acetate 40 MG/ML Outcome: tolerated well, no immediate complications Procedure, treatment alternatives, risks and benefits explained, specific risks discussed. Consent was given by the patient. Immediately prior to procedure a time out was called to verify the correct patient, procedure, equipment, support staff and site/side marked as required. Patient was prepped and  draped in the usual sterile fashion.   Large Joint Inj: L knee on 02/20/2018 3:09 PM Indications: diagnostic evaluation and pain Details: 22 G 1.5 in needle, superolateral approach  Arthrogram: No  Medications: 3 mL lidocaine 1 %; 40 mg methylPREDNISolone acetate 40 MG/ML Outcome: tolerated well, no immediate complications Procedure, treatment alternatives, risks and benefits explained, specific risks discussed. Consent was given by the patient. Immediately prior to procedure a time out was called to verify the correct patient, procedure, equipment, support staff and site/side marked as required. Patient was prepped and draped in the usual sterile fashion.       Clinical Data: No additional findings.   Subjective: Chief Complaint  Patient presents with  . Left Knee - Pain  . Right Knee - Pain  The patient is someone I seen in the remote past.  He is very thin 67 year old gentleman with severe bilateral knee pain and bilateral knee bowing.  He ambulates with a rolling walker.  He has daily knee pain.  He had a stroke a few years ago and feels like he is still recovering from the effects of his stroke.  He is not on any blood thinning medication.  He says he sees his primary care physician in September.  He does take glucosamine for his knees.  He wears knee braces on both his knees as well.  HPI  Review of Systems He currently  denies any headache, chest pain, shortness of breath, fever, chills, nausea, vomiting.  Objective: Vital Signs: There were no vitals taken for this visit.  Physical Exam He is alert and oriented x3 and in no acute distress Ortho Exam He ambulates very slowly.  He does use a walker.  Both knees have significant varus deformities of bowling.  Both knees have good range of motion but are swollen more from his bony swelling.  You can palpate spurs all around his knee even posteriorly on both sides. Specialty Comments:  No specialty comments  available.  Imaging: Xr Knee 1-2 Views Left  Result Date: 02/20/2018 2 views of the left knee show severe end-stage arthritis of left knee with significant and severe varus malalignment.  There are particular osteophytes throughout the knee.  Xr Knee 1-2 Views Right  Result Date: 02/20/2018 2 views of the right knee show severe and profound end-stage arthritis with significant varus malalignment.  There is complete loss of medial joint space and para-articular osteophytes throughout the knee.    PMFS History: Patient Active Problem List   Diagnosis Date Noted  . Gait disturbance, post-stroke 01/21/2015  . Arrhythmia 12/02/2014  . Palpitations 12/02/2014  . Cerebral infarction due to thrombosis of left middle cerebral artery ([place]) 12/02/2014  . HLD (hyperlipidemia) 12/02/2014  . Right hemiparesis ([place]) 09/25/2014  . Other secondary osteoarthritis of both knees 09/25/2014  . Basal ganglia infarction ([place]) 09/25/2014  . Ischemic stroke ([place]) 09/22/2014  . Dyslipidemia 09/22/2014  . Thrombocytopenia ([place]) 09/22/2014  . Essential hypertension   . Stroke ([place]) 09/21/2014  . Uncontrolled hypertension 09/21/2014   Past Medical History:  Diagnosis Date  . Arthritis   . Cataracts, bilateral   . Glaucoma   . Hypertension   . Knee injury    right knee injury   . Stroke [facility])     Family History  Problem Relation Age of Onset  . Stroke Mother   . Cancer Mother        lung  . Stuttering Unknown   . Liver cancer Father   . Diabetes Mellitus II Brother     Past Surgical History:  Procedure Laterality Date  . INGUINAL HERNIA REPAIR Right 1988  . KNEE SURGERY Left 1980's   x 4   Social History   Occupational History  . Occupation: disabled  Tobacco Use  . Smoking status: Former Smoker    Types: Cigarettes    Last attempt to quit: 09/26/1983    Years since quitting: 34.4  Substance and Sexual Activity  . Alcohol use: No  . Drug use: No  . Sexual activity: Not on file

## 2018-04-06 DIAGNOSIS — R69 Illness, unspecified: Secondary | ICD-10-CM | POA: Diagnosis not present

## 2018-04-24 ENCOUNTER — Encounter (INDEPENDENT_AMBULATORY_CARE_PROVIDER_SITE_OTHER): Payer: Self-pay | Admitting: Orthopaedic Surgery

## 2018-04-24 ENCOUNTER — Ambulatory Visit (INDEPENDENT_AMBULATORY_CARE_PROVIDER_SITE_OTHER): Payer: Medicare HMO | Admitting: Orthopaedic Surgery

## 2018-04-24 DIAGNOSIS — M1712 Unilateral primary osteoarthritis, left knee: Secondary | ICD-10-CM | POA: Diagnosis not present

## 2018-04-24 DIAGNOSIS — M1710 Unilateral primary osteoarthritis, unspecified knee: Secondary | ICD-10-CM | POA: Insufficient documentation

## 2018-04-24 DIAGNOSIS — M179 Osteoarthritis of knee, unspecified: Secondary | ICD-10-CM | POA: Insufficient documentation

## 2018-04-24 DIAGNOSIS — M1711 Unilateral primary osteoarthritis, right knee: Secondary | ICD-10-CM | POA: Diagnosis not present

## 2018-04-24 MED ORDER — METHYLPREDNISOLONE ACETATE 40 MG/ML IJ SUSP
40.0000 mg | INTRAMUSCULAR | Status: AC | PRN
  Administered 2018-04-24: 40 mg via INTRA_ARTICULAR

## 2018-04-24 MED ORDER — LIDOCAINE HCL 1 % IJ SOLN
3.0000 mL | INTRAMUSCULAR | Status: AC | PRN
  Administered 2018-04-24: 3 mL

## 2018-04-24 MED ORDER — METHYLPREDNISOLONE ACETATE 40 MG/ML IJ SUSP
40.0000 mg | INTRAMUSCULAR | Status: AC | PRN
Start: 2018-04-24 — End: 2018-04-24
  Administered 2018-04-24: 40 mg via INTRA_ARTICULAR

## 2018-04-24 NOTE — Progress Notes (Signed)
Office Visit Note   Patient: [name]           Date of Birth: 08-26-1950           MRN: [id number] Visit Date: 04/24/2018              Requested by: Vincente Liberty, MD [address] [place], [place] [address] PCP: Vincente Liberty, MD   Assessment & Plan: Visit Diagnoses:  1. Unilateral primary osteoarthritis, right knee   2. Unilateral primary osteoarthritis, left knee     Plan: He tolerated the steroid injection well in both knees today.  As far as knee replacement goes, I would pursue his left knee first.  Once we have medical clearance for surgery we can get this scheduled.  All questions concerns were answered and addressed.  Follow-Up Instructions: No follow-ups on file.   Orders:  No orders of the defined types were placed in this encounter.  No orders of the defined types were placed in this encounter.     Procedures: Large Joint Inj: R knee on 04/24/2018 2:52 PM Indications: diagnostic evaluation and pain Details: 22 G 1.5 in needle, superolateral approach  Arthrogram: No  Medications: 3 mL lidocaine 1 %; 40 mg methylPREDNISolone acetate 40 MG/ML Outcome: tolerated well, no immediate complications Procedure, treatment alternatives, risks and benefits explained, specific risks discussed. Consent was given by the patient. Immediately prior to procedure a time out was called to verify the correct patient, procedure, equipment, support staff and site/side marked as required. Patient was prepped and draped in the usual sterile fashion.   Large Joint Inj: L knee on 04/24/2018 2:52 PM Indications: diagnostic evaluation and pain Details: 22 G 1.5 in needle, superolateral approach  Arthrogram: No  Medications: 3 mL lidocaine 1 %; 40 mg methylPREDNISolone acetate 40 MG/ML Outcome: tolerated well, no immediate complications Procedure, treatment alternatives, risks and benefits explained, specific risks discussed. Consent was given by the patient.  Immediately prior to procedure a time out was called to verify the correct patient, procedure, equipment, support staff and site/side marked as required. Patient was prepped and draped in the usual sterile fashion.       Clinical Data: No additional findings.   Subjective: Chief Complaint  Patient presents with  . Left Knee - Follow-up  The patient has known severe end-stage arthritis in both his knees with significant severe varus malalignment.  He walks for knee braces.  He has had steroid injections in both knees and absent and his primary care physician for surgical clearance.  His primary care physician is apparently on medical leave with absent and will be available in November.  The patient would rather see his primary care physician who knows him well since he himself has had a history of stroke in the past.  He is now on no blood thinners.  He would like to have steroid injections in both knees today to help temporize his symptoms while he waits.  He continues with a rolling walker as well.  HPI  Review of Systems He is alert and oriented x3 in no acute distress.  Denies any headache, chest pain, shortness of breath, fever, chills, nausea, vomiting.  Objective: Vital Signs: There were no vitals taken for this visit.  Physical Exam He is alert and oriented x3 and in no acute distress Ortho Exam Examination of both knees show severe varus malalignment with significant crepitation of both knees. Specialty Comments:  No specialty comments available.  Imaging: No results found. X-rays  from last time again show severe end-stage arthritis of both knees with severe deformities of both knees with varus malalignment.  PMFS History: Patient Active Problem List   Diagnosis Date Noted  . Gait disturbance, post-stroke 01/21/2015  . Arrhythmia 12/02/2014  . Palpitations 12/02/2014  . Cerebral infarction due to thrombosis of left middle cerebral artery ([place]) 12/02/2014  . HLD  (hyperlipidemia) 12/02/2014  . Right hemiparesis ([place]) 09/25/2014  . Other secondary osteoarthritis of both knees 09/25/2014  . Basal ganglia infarction ([place]) 09/25/2014  . Ischemic stroke ([place]) 09/22/2014  . Dyslipidemia 09/22/2014  . Thrombocytopenia ([place]) 09/22/2014  . Essential hypertension   . Stroke ([place]) 09/21/2014  . Uncontrolled hypertension 09/21/2014   Past Medical History:  Diagnosis Date  . Arthritis   . Cataracts, bilateral   . Glaucoma   . Hypertension   . Knee injury    right knee injury   . Stroke [facility])     Family History  Problem Relation Age of Onset  . Stroke Mother   . Cancer Mother        lung  . Stuttering Unknown   . Liver cancer Father   . Diabetes Mellitus II Brother     Past Surgical History:  Procedure Laterality Date  . INGUINAL HERNIA REPAIR Right 1988  . KNEE SURGERY Left 1980's   x 4   Social History   Occupational History  . Occupation: disabled  Tobacco Use  . Smoking status: Former Smoker    Types: Cigarettes    Last attempt to quit: 09/26/1983    Years since quitting: 34.6  Substance and Sexual Activity  . Alcohol use: No  . Drug use: No  . Sexual activity: Not on file

## 2018-07-06 DIAGNOSIS — K649 Unspecified hemorrhoids: Secondary | ICD-10-CM | POA: Diagnosis not present

## 2018-07-06 DIAGNOSIS — R911 Solitary pulmonary nodule: Secondary | ICD-10-CM | POA: Diagnosis not present

## 2018-07-06 DIAGNOSIS — Z125 Encounter for screening for malignant neoplasm of prostate: Secondary | ICD-10-CM | POA: Diagnosis not present

## 2018-07-06 DIAGNOSIS — R7302 Impaired glucose tolerance (oral): Secondary | ICD-10-CM | POA: Diagnosis not present

## 2018-07-06 DIAGNOSIS — B1920 Unspecified viral hepatitis C without hepatic coma: Secondary | ICD-10-CM | POA: Diagnosis not present

## 2018-07-06 DIAGNOSIS — B1710 Acute hepatitis C without hepatic coma: Secondary | ICD-10-CM | POA: Diagnosis not present

## 2018-07-06 DIAGNOSIS — E559 Vitamin D deficiency, unspecified: Secondary | ICD-10-CM | POA: Diagnosis not present

## 2018-07-06 DIAGNOSIS — E785 Hyperlipidemia, unspecified: Secondary | ICD-10-CM | POA: Diagnosis not present

## 2018-07-06 DIAGNOSIS — Z86010 Personal history of colonic polyps: Secondary | ICD-10-CM | POA: Diagnosis not present

## 2018-07-06 DIAGNOSIS — Z79899 Other long term (current) drug therapy: Secondary | ICD-10-CM | POA: Diagnosis not present

## 2018-07-06 DIAGNOSIS — I119 Hypertensive heart disease without heart failure: Secondary | ICD-10-CM | POA: Diagnosis not present

## 2018-07-06 DIAGNOSIS — I6990 Unspecified sequelae of unspecified cerebrovascular disease: Secondary | ICD-10-CM | POA: Diagnosis not present

## 2018-08-07 DIAGNOSIS — M1990 Unspecified osteoarthritis, unspecified site: Secondary | ICD-10-CM | POA: Diagnosis not present

## 2018-08-07 DIAGNOSIS — I119 Hypertensive heart disease without heart failure: Secondary | ICD-10-CM | POA: Diagnosis not present

## 2018-08-07 DIAGNOSIS — Z0001 Encounter for general adult medical examination with abnormal findings: Secondary | ICD-10-CM | POA: Diagnosis not present

## 2018-08-07 DIAGNOSIS — I6990 Unspecified sequelae of unspecified cerebrovascular disease: Secondary | ICD-10-CM | POA: Diagnosis not present

## 2018-08-07 DIAGNOSIS — R399 Unspecified symptoms and signs involving the genitourinary system: Secondary | ICD-10-CM | POA: Diagnosis not present

## 2018-08-07 DIAGNOSIS — K649 Unspecified hemorrhoids: Secondary | ICD-10-CM | POA: Diagnosis not present

## 2018-08-07 DIAGNOSIS — R911 Solitary pulmonary nodule: Secondary | ICD-10-CM | POA: Diagnosis not present

## 2018-08-07 DIAGNOSIS — Z79899 Other long term (current) drug therapy: Secondary | ICD-10-CM | POA: Diagnosis not present

## 2018-08-07 DIAGNOSIS — Z86010 Personal history of colonic polyps: Secondary | ICD-10-CM | POA: Diagnosis not present

## 2018-08-07 DIAGNOSIS — R7302 Impaired glucose tolerance (oral): Secondary | ICD-10-CM | POA: Diagnosis not present

## 2018-09-14 DIAGNOSIS — Z7982 Long term (current) use of aspirin: Secondary | ICD-10-CM | POA: Diagnosis not present

## 2018-09-14 DIAGNOSIS — M1990 Unspecified osteoarthritis, unspecified site: Secondary | ICD-10-CM | POA: Diagnosis not present

## 2018-09-14 DIAGNOSIS — N400 Enlarged prostate without lower urinary tract symptoms: Secondary | ICD-10-CM | POA: Diagnosis not present

## 2018-09-14 DIAGNOSIS — Z7722 Contact with and (suspected) exposure to environmental tobacco smoke (acute) (chronic): Secondary | ICD-10-CM | POA: Diagnosis not present

## 2018-09-14 DIAGNOSIS — Z809 Family history of malignant neoplasm, unspecified: Secondary | ICD-10-CM | POA: Diagnosis not present

## 2018-09-14 DIAGNOSIS — G8929 Other chronic pain: Secondary | ICD-10-CM | POA: Diagnosis not present

## 2018-09-14 DIAGNOSIS — E785 Hyperlipidemia, unspecified: Secondary | ICD-10-CM | POA: Diagnosis not present

## 2018-09-14 DIAGNOSIS — R69 Illness, unspecified: Secondary | ICD-10-CM | POA: Diagnosis not present

## 2018-09-14 DIAGNOSIS — K5900 Constipation, unspecified: Secondary | ICD-10-CM | POA: Diagnosis not present

## 2018-09-14 DIAGNOSIS — I10 Essential (primary) hypertension: Secondary | ICD-10-CM | POA: Diagnosis not present

## 2018-10-02 DIAGNOSIS — B182 Chronic viral hepatitis C: Secondary | ICD-10-CM | POA: Diagnosis not present

## 2018-10-06 ENCOUNTER — Other Ambulatory Visit: Payer: Self-pay | Admitting: Nurse Practitioner

## 2018-10-06 DIAGNOSIS — B182 Chronic viral hepatitis C: Secondary | ICD-10-CM | POA: Diagnosis not present

## 2018-10-10 ENCOUNTER — Other Ambulatory Visit: Payer: Self-pay | Admitting: Nurse Practitioner

## 2018-10-10 DIAGNOSIS — K7469 Other cirrhosis of liver: Secondary | ICD-10-CM

## 2018-10-12 ENCOUNTER — Other Ambulatory Visit: Payer: Medicare HMO

## 2018-10-15 ENCOUNTER — Ambulatory Visit
Admission: RE | Admit: 2018-10-15 | Discharge: 2018-10-15 | Disposition: A | Discharge status: Home / Self Care | Payer: Medicare HMO | Source: Ambulatory Visit | Attending: Nurse Practitioner | Admitting: Nurse Practitioner

## 2018-10-15 ENCOUNTER — Other Ambulatory Visit: Payer: Self-pay

## 2018-10-15 DIAGNOSIS — K7469 Other cirrhosis of liver: Secondary | ICD-10-CM

## 2018-10-15 DIAGNOSIS — K7689 Other specified diseases of liver: Secondary | ICD-10-CM | POA: Diagnosis not present

## 2018-10-15 MED ORDER — GADOXETATE DISODIUM 0.25 MMOL/ML IV SOLN
10.0000 mL | Freq: Once | INTRAVENOUS | Status: AC | PRN
  Administered 2018-10-15: 10 mL via INTRAVENOUS

## 2018-11-06 DIAGNOSIS — I6990 Unspecified sequelae of unspecified cerebrovascular disease: Secondary | ICD-10-CM | POA: Diagnosis not present

## 2018-11-06 DIAGNOSIS — I119 Hypertensive heart disease without heart failure: Secondary | ICD-10-CM | POA: Diagnosis not present

## 2018-11-06 DIAGNOSIS — K739 Chronic hepatitis, unspecified: Secondary | ICD-10-CM | POA: Diagnosis not present

## 2018-11-06 DIAGNOSIS — Z79899 Other long term (current) drug therapy: Secondary | ICD-10-CM | POA: Diagnosis not present

## 2018-11-27 DIAGNOSIS — K7469 Other cirrhosis of liver: Secondary | ICD-10-CM | POA: Diagnosis not present

## 2018-11-27 DIAGNOSIS — R772 Abnormality of alphafetoprotein: Secondary | ICD-10-CM | POA: Diagnosis not present

## 2018-11-27 DIAGNOSIS — B182 Chronic viral hepatitis C: Secondary | ICD-10-CM | POA: Diagnosis not present

## 2019-01-01 DIAGNOSIS — I119 Hypertensive heart disease without heart failure: Secondary | ICD-10-CM | POA: Diagnosis not present

## 2019-01-01 DIAGNOSIS — Z79899 Other long term (current) drug therapy: Secondary | ICD-10-CM | POA: Diagnosis not present

## 2019-01-01 DIAGNOSIS — I6990 Unspecified sequelae of unspecified cerebrovascular disease: Secondary | ICD-10-CM | POA: Diagnosis not present

## 2019-01-01 DIAGNOSIS — K739 Chronic hepatitis, unspecified: Secondary | ICD-10-CM | POA: Diagnosis not present

## 2019-01-23 ENCOUNTER — Telehealth: Payer: Self-pay | Admitting: Orthopaedic Surgery

## 2019-01-23 NOTE — Telephone Encounter (Signed)
Patient called asked if he can get a Rx for bil knee braces and a back brace? The number to contact patient is [phone]

## 2019-01-23 NOTE — Telephone Encounter (Signed)
Called patient left message on voicemail to return call to schedule an appointment with Dr Ninfa Linden or Artis Delay    Patient have not been in the office for a while.

## 2019-01-23 NOTE — Telephone Encounter (Signed)
I agree that we need to see him first.  However, he can get knee braces and back braces at [facility], and even [facility].

## 2019-01-23 NOTE — Telephone Encounter (Signed)
Can we make him an appt, we haven't seen him in so long, we probably should before we give him braces. Unless he wants to get braces at a pharmacy?

## 2019-01-23 NOTE — Telephone Encounter (Signed)
See below We haven't seen him in awhile

## 2019-01-25 NOTE — Telephone Encounter (Signed)
Patient is scheduled 02/20/2019 at 9:15am with Dr Frederica Kuster

## 2019-02-20 ENCOUNTER — Other Ambulatory Visit: Payer: Self-pay

## 2019-02-20 ENCOUNTER — Encounter: Payer: Self-pay | Admitting: Orthopaedic Surgery

## 2019-02-20 ENCOUNTER — Ambulatory Visit (INDEPENDENT_AMBULATORY_CARE_PROVIDER_SITE_OTHER): Payer: Medicare HMO | Admitting: Orthopaedic Surgery

## 2019-02-20 DIAGNOSIS — M1711 Unilateral primary osteoarthritis, right knee: Secondary | ICD-10-CM | POA: Diagnosis not present

## 2019-02-20 DIAGNOSIS — M1712 Unilateral primary osteoarthritis, left knee: Secondary | ICD-10-CM | POA: Diagnosis not present

## 2019-02-20 DIAGNOSIS — E785 Hyperlipidemia, unspecified: Secondary | ICD-10-CM | POA: Diagnosis not present

## 2019-02-20 MED ORDER — LIDOCAINE HCL 1 % IJ SOLN
0.5000 mL | INTRAMUSCULAR | Status: AC | PRN
  Administered 2019-02-20: .5 mL

## 2019-02-20 MED ORDER — METHYLPREDNISOLONE ACETATE 40 MG/ML IJ SUSP
40.0000 mg | INTRAMUSCULAR | Status: AC | PRN
  Administered 2019-02-20: 40 mg via INTRA_ARTICULAR

## 2019-02-20 MED ORDER — LIDOCAINE HCL 1 % IJ SOLN
0.5000 mL | INTRAMUSCULAR | Status: AC | PRN
Start: 2019-02-20 — End: 2019-02-20
  Administered 2019-02-20: .5 mL

## 2019-02-20 NOTE — Progress Notes (Signed)
   Procedure Note  Patient: Amere Bricco             Date of Birth: June 29, 1951           MRN: [id number]             Visit Date: 02/20/2019 HPI: Patient is a 68 year old male well-known to our practice service has known severe end-stage osteoarthritis of both knees.  Last had injection in September 2019 in both knees.  Is requesting injections in both knees today.  He has had no new injury.  He is also requesting braces for both knees.  He has had braces for both knees since hospitalization after stroke in 2016.  Bilateral knees: Severe varus deformities of both knees.  No abnormal warmth erythema or effusion of either knee.  Procedures: Visit Diagnoses:  1. Unilateral primary osteoarthritis, right knee   2. Unilateral primary osteoarthritis, left knee     Large Joint Inj: bilateral knee on 02/20/2019 9:55 AM Indications: pain Details: 22 G 1.5 in needle, anterolateral approach  Arthrogram: No  Medications (Right): 0.5 mL lidocaine 1 %; 40 mg methylPREDNISolone acetate 40 MG/ML Medications (Left): 0.5 mL lidocaine 1 %; 40 mg methylPREDNISolone acetate 40 MG/ML Outcome: tolerated well, no immediate complications Procedure, treatment alternatives, risks and benefits explained, specific risks discussed. Consent was given by the patient. Immediately prior to procedure a time out was called to verify the correct patient, procedure, equipment, support staff and site/side marked as required. Patient was prepped and draped in the usual sterile fashion.     Plan: He is given a prescription for new Bledsoe type braces for both knees.  He understands he needs to wait at least 3 months between injections in the knees.  Follow-up with [place] on as-needed basis.  Questions encouraged and answered at length.

## 2019-02-26 ENCOUNTER — Ambulatory Visit: Payer: Medicare HMO | Admitting: Orthopaedic Surgery

## 2019-03-01 ENCOUNTER — Ambulatory Visit: Payer: Medicare HMO | Admitting: Orthopaedic Surgery

## 2019-03-06 DIAGNOSIS — B182 Chronic viral hepatitis C: Secondary | ICD-10-CM | POA: Diagnosis not present

## 2019-03-12 ENCOUNTER — Encounter: Payer: Self-pay | Admitting: Gastroenterology

## 2019-03-28 DIAGNOSIS — R69 Illness, unspecified: Secondary | ICD-10-CM | POA: Diagnosis not present

## 2019-04-12 ENCOUNTER — Encounter: Payer: Self-pay | Admitting: Emergency Medicine

## 2019-04-12 DIAGNOSIS — B182 Chronic viral hepatitis C: Secondary | ICD-10-CM | POA: Diagnosis not present

## 2019-04-18 ENCOUNTER — Ambulatory Visit (INDEPENDENT_AMBULATORY_CARE_PROVIDER_SITE_OTHER): Payer: Medicare HMO | Admitting: Gastroenterology

## 2019-04-18 ENCOUNTER — Other Ambulatory Visit: Payer: Self-pay

## 2019-04-18 ENCOUNTER — Encounter: Payer: Self-pay | Admitting: Gastroenterology

## 2019-04-18 VITALS — BP 106/70 | HR 60 | Temp 98.6°F | Ht 67.0 in | Wt 176.5 lb

## 2019-04-18 DIAGNOSIS — D696 Thrombocytopenia, unspecified: Secondary | ICD-10-CM

## 2019-04-18 DIAGNOSIS — K7469 Other cirrhosis of liver: Secondary | ICD-10-CM

## 2019-04-18 MED ORDER — NA SULFATE-K SULFATE-MG SULF 17.5-3.13-1.6 GM/177ML PO SOLN: 1.0000 | ORAL | 0 refills | Status: AC

## 2019-04-18 NOTE — Patient Instructions (Signed)
You have been scheduled for an endoscopy and colonoscopy. Please follow the written instructions given to you at your visit today. Please pick up your prep supplies at the pharmacy within the next 1-3 days. If you use inhalers (even only as needed), please bring them with you on the day of your procedure.  

## 2019-04-18 NOTE — Progress Notes (Signed)
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 Will work to obtain recent labs to clarify the extent of thrombocytopenia. I have also recommended a colonoscopy for colon cancer screening.     PLAN: Obtain recent labs documenting platelets EGD to screen for varices Colonoscopy for colon cancer screening  Please see the "Patient Instructions" section for addition details about the plan.  I consented the patient discussing the risks, benefits, and alternatives to endoscopic evaluation. In particular, we discussed the risks that include, but are not limited to, reaction to medication, cardiopulmonary compromise, bleeding requiring blood transfusion, aspiration resulting in pneumonia, perforation requiring surgery, lack of diagnosis, severe illness requiring hospitalization, and even death. We reviewed the risk of missed lesion including polyps or even cancer. The patient acknowledges these risks and asks that we proceed.  HPI: [name] is a 68 y.o. male referred by NP Drazek for further evaluation of cirrhosis with need to screen for esophageal varices.  The history is obtained through the patient and review of his electronic health record.  He is followed in the [facility] liver care clinic for child's a cirrhosis cirrhosis (MELD 10)  due to treatment nave genotype 1b chronic hepatitis C with suspected portal hypertension given thrombocytopenia.  He also carries a history of BPH, hyperlipidemia,  hypertension, and osteoarthritis.  Patient have a liver fibro-test estimating F4 fibrosis and was noted to have an elevated AFP.  Subsequent MRI showed cirrhosis without focal lesion.  There was no splenomegaly, varices, or intra-abdominal collaterals.    He started HCV treatment with Epiclusa last month.   No prior colon cancer screening. No prior upper endoscopy.  No recent labs available in his referral records.   He takes a daily probiotic. Takes a stool softener to soften the stools, but, he denies constipation. Uses it mostly to avoid a hemorrhoid flare. Denies any ongoing GI symptoms.   Mother with stomach cancer but died due to a stroke. Father died of liver cancer at age 51. No known family history of colon cancer or polyps. No other family history of uterine/endometrial cancer, pancreatic cancer or gastric/stomach cancer. No family history of liver disease.    Past Medical History:  Diagnosis Date   Arthritis    BPH (benign prostatic hyperplasia)    Cataracts, bilateral    Cirrhosis ([place])    Colon polyps    Glaucoma    Hepatitis C    Hyperlipidemia    Hypertension    Knee injury    right knee injury    Stroke [facility])     Past Surgical History:  Procedure Laterality Date   INGUINAL HERNIA REPAIR Right 1988   KNEE SURGERY Left 1980's   x 4    Current Outpatient Medications  Medication Sig Dispense Refill   potassium chloride (K-DUR) 10 MEQ tablet Take 10 mEq by mouth daily.     saw palmetto 160 MG capsule Take 160 mg by mouth 2 (two) times daily.     Sofosbuvir-Velpatasvir (EPCLUSA) 400-100 MG TABS Take  by mouth daily.     acetaminophen (TYLENOL) 325 MG tablet Take 650 mg by mouth every 6 (six) hours as needed.     amLODipine (NORVASC) 10 MG tablet Take 10 mg by mouth daily.     Ascorbic Acid (VITAMIN C) 1000 MG tablet Take 1,000 mg by mouth daily.     aspirin 325 MG tablet Take 325 mg by mouth daily.     atorvastatin (LIPITOR) 10 MG tablet  Take 10 mg by mouth daily.     B Complex-C (SUPER B COMPLEX PO) Take by mouth.     chlorthalidone (HYGROTON) 25 MG tablet 25 mg as needed (0.5-1 tab prn).      cycloSPORINE (RESTASIS) 0.05 % ophthalmic emulsion 1 drop 2 (two) times daily.     docusate sodium (COLACE) 250 MG capsule Take 250 mg by mouth 2 (two) times daily as needed for constipation.     glucosamine-chondroitin 500-400 MG tablet Take 1 tablet by mouth daily.     lisinopril (PRINIVIL,ZESTRIL) 10 MG tablet Take 10 mg by mouth daily.     metoprolol (LOPRESSOR) 50 MG tablet Take 50 mg by mouth 2 (two) times daily.     Multiple Vitamins-Minerals (MULTIVITAMIN WITH MINERALS) tablet Take 1 tablet by mouth daily.     VOLTAREN 1 % GEL as needed.   2   No current facility-administered medications for this visit.     Allergies as of 04/18/2019   (No Known Allergies)    Family History  Problem Relation Age of Onset   Stroke Mother    Lung cancer Mother             Heart disease Mother    Liver cancer Father    Hypertension Sister    Thyroid disease Sister    Diabetes Mellitus II Brother    Stuttering Other    Cirrhosis Maternal Aunt     Social History   Socioeconomic History   Marital status: Single    Spouse name: Not on file   Number of children: 0   Years of education: 11   Highest education level: Not on file  Occupational History   Occupation: disabled  Social Designer, fashion/clothing strain: Not on file   Food insecurity    Worry: Not on file    Inability: Not on file   Transportation needs    Medical: Not on file    Non-medical: Not on file  Tobacco Use   Smoking status: Former Smoker    Types: Cigarettes    Quit date: 09/26/1983    Years since quitting: 35.5   Smokeless tobacco: Never Used  Substance and Sexual Activity   Alcohol use: Not Currently    Comment: 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 Has not had any alcohol since 1983.    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   Sexual activity: Not on file  Lifestyle   Physical activity    Days per week: Not on file    Minutes per session: Not on file   Stress: Not on file  Relationships   Social connections    Talks on phone: Not on file    Gets together: Not on file    Attends religious service: Not on file    Active member of club or organization: Not on file  Attends meetings of clubs or organizations: Not on file    Relationship status: Not on file   Intimate partner violence    Fear of current or ex partner: Not on file    Emotionally abused: Not on file    Physically abused: Not on file    Forced sexual activity: Not on file  Other Topics Concern   Not on file  Social History Narrative   Lives with sister, on disability, no children   Caffeine use - none   Right handed    Review of Systems: 12 system ROS is negative except as noted above except for arthritis.   Physical Exam: General:   Alert,  well-nourished, pleasant and cooperative in NAD Head:  Normocephalic and atraumatic. Eyes:  Sclera clear, no icterus.   Conjunctiva pink. Ears:  Normal auditory acuity. Nose:  No deformity, discharge,  or lesions. Mouth:  No deformity or lesions.   Neck:  Supple; no masses or thyromegaly. Lungs:  Clear throughout to auscultation.   No wheezes. Heart:  Regular rate and rhythm; no murmurs. Abdomen:  Soft,nontender, nondistended, normal bowel sounds, no rebound or guarding. No hepatosplenomegaly.   Rectal:  Deferred  Msk:  Symmetrical. No boney deformities LAD: No inguinal or umbilical LAD Extremities:  No clubbing or edema. Neurologic:  Alert and  oriented x4;  grossly nonfocal Skin:  Intact without significant lesions or rashes. Psych:  Alert and cooperative. Normal mood and affect.     Ellery Tash L. Tarri Glenn, MD, MPH 04/18/2019, 1:51 PM

## 2019-04-24 DIAGNOSIS — R7302 Impaired glucose tolerance (oral): Secondary | ICD-10-CM | POA: Diagnosis not present

## 2019-04-24 DIAGNOSIS — K648 Other hemorrhoids: Secondary | ICD-10-CM | POA: Diagnosis not present

## 2019-04-24 DIAGNOSIS — Z79899 Other long term (current) drug therapy: Secondary | ICD-10-CM | POA: Diagnosis not present

## 2019-04-24 DIAGNOSIS — R911 Solitary pulmonary nodule: Secondary | ICD-10-CM | POA: Diagnosis not present

## 2019-04-24 DIAGNOSIS — Z8371 Family history of colonic polyps: Secondary | ICD-10-CM | POA: Diagnosis not present

## 2019-04-24 DIAGNOSIS — Z125 Encounter for screening for malignant neoplasm of prostate: Secondary | ICD-10-CM | POA: Diagnosis not present

## 2019-04-24 DIAGNOSIS — K739 Chronic hepatitis, unspecified: Secondary | ICD-10-CM | POA: Diagnosis not present

## 2019-04-24 DIAGNOSIS — E785 Hyperlipidemia, unspecified: Secondary | ICD-10-CM | POA: Diagnosis not present

## 2019-04-24 DIAGNOSIS — I6990 Unspecified sequelae of unspecified cerebrovascular disease: Secondary | ICD-10-CM | POA: Diagnosis not present

## 2019-04-24 DIAGNOSIS — I119 Hypertensive heart disease without heart failure: Secondary | ICD-10-CM | POA: Diagnosis not present

## 2019-04-24 DIAGNOSIS — Z139 Encounter for screening, unspecified: Secondary | ICD-10-CM | POA: Diagnosis not present

## 2019-05-18 ENCOUNTER — Telehealth: Payer: Self-pay | Admitting: Gastroenterology

## 2019-05-18 NOTE — Telephone Encounter (Signed)
Spoke with patient and went over the prep with Suprep, answered all questions and told him to call back if he has any additional questions.

## 2019-05-18 NOTE — Telephone Encounter (Signed)
Pt is scheduled for EGD/col 05/24/19 and requested a call back to discuss instructions.

## 2019-05-18 NOTE — Telephone Encounter (Signed)
Left message on patients voicemail to call office.   

## 2019-05-21 NOTE — Telephone Encounter (Signed)
Spoke with patient and reiterated the prep instructions and when to stop medications. Went over grocery list with woman who is helping him prep and she confirmed the clear liquids and jello that she had brought were ok. She also stated patient has a history of stroke and at one point was sent a "home colonoscopy" test.  I informed her that she can just contact the insurance company and let them know that he is having a colonoscopy and does not need to do both.

## 2019-05-23 ENCOUNTER — Telehealth: Payer: Self-pay | Admitting: Gastroenterology

## 2019-05-23 NOTE — Telephone Encounter (Signed)
No to all questions

## 2019-05-24 ENCOUNTER — Encounter: Payer: Self-pay | Admitting: Gastroenterology

## 2019-05-24 ENCOUNTER — Other Ambulatory Visit: Payer: Self-pay

## 2019-05-24 ENCOUNTER — Ambulatory Visit (AMBULATORY_SURGERY_CENTER): Payer: Medicare HMO | Admitting: Gastroenterology

## 2019-05-24 VITALS — BP 125/67 | HR 53 | Temp 98.4°F | Resp 17 | Ht 67.0 in | Wt 176.0 lb

## 2019-05-24 DIAGNOSIS — K7469 Other cirrhosis of liver: Secondary | ICD-10-CM

## 2019-05-24 DIAGNOSIS — D120 Benign neoplasm of cecum: Secondary | ICD-10-CM | POA: Diagnosis not present

## 2019-05-24 DIAGNOSIS — R69 Illness, unspecified: Secondary | ICD-10-CM | POA: Diagnosis not present

## 2019-05-24 DIAGNOSIS — K449 Diaphragmatic hernia without obstruction or gangrene: Secondary | ICD-10-CM | POA: Diagnosis not present

## 2019-05-24 DIAGNOSIS — D124 Benign neoplasm of descending colon: Secondary | ICD-10-CM | POA: Diagnosis not present

## 2019-05-24 DIAGNOSIS — Z1211 Encounter for screening for malignant neoplasm of colon: Secondary | ICD-10-CM | POA: Diagnosis not present

## 2019-05-24 DIAGNOSIS — K7030 Alcoholic cirrhosis of liver without ascites: Secondary | ICD-10-CM

## 2019-05-24 DIAGNOSIS — I8510 Secondary esophageal varices without bleeding: Secondary | ICD-10-CM

## 2019-05-24 MED ORDER — SODIUM CHLORIDE 0.9 % IV SOLN: 500.0000 mL | Freq: Once | INTRAVENOUS | Status: DC

## 2019-05-24 NOTE — Progress Notes (Signed)
Called to room to assist during endoscopic procedure.  Patient ID and intended procedure confirmed with present staff. Received instructions for my participation in the procedure from the performing physician.  

## 2019-05-24 NOTE — Op Note (Signed)
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                           -Meld of 10                           -MRI showed no evidence for portal hypertension or                            HCC                           Genotype 1b chronic hepatitis C, on Epclusa                           Thrombocytopenia Medicines:                See the Anesthesia note for documentation of the                            administered medications Procedure:                Pre-Anesthesia Assessment:                           - Prior to the procedure, a History and Physical                            was performed, and patient medications and                            allergies were reviewed. The patient's tolerance of                            previous anesthesia was also reviewed. The risks                            and benefits of the procedure and the sedation                            options and risks were discussed with the patient.                            All questions were answered, and informed consent                            was obtained. Prior Anticoagulants: The  patient has                            taken no previous anticoagulant or antiplatelet                            agents. ASA Grade Assessment: III - A patient with                            severe systemic disease. After reviewing the risks                            and benefits, the patient was deemed in                            satisfactory condition to undergo the procedure.                           After obtaining informed consent, the endoscope was                             passed under direct vision. Throughout the                            procedure, the patient's blood pressure, pulse, and                            oxygen saturations were monitored continuously. The                            Endoscope was introduced through the mouth, and                            advanced to the third part of duodenum. The upper                            GI endoscopy was accomplished without difficulty.                            The patient tolerated the procedure well. Scope In: Scope Out: Findings:                 3 prominent veins-early varices were present in the                            distal esophagus. They were flat and did not change                            with insufflation.                           A small hiatal hernia was present.                           The stomach was normal. No portal  hypertensive                            gastropathy or gastric varices.                           The examined duodenum was normal. Complications:            No immediate complications. Estimated Blood Loss:     Estimated blood loss: none. Impression:               - Grade I esophageal varices.                           - Small hiatal hernia.                           - Normal stomach.                           - Normal examined duodenum.                           - No specimens collected. Recommendation:           - Patient has a contact number available for                            emergencies. The signs and symptoms of potential                            delayed complications were discussed with the                            patient. Return to normal activities tomorrow.                            Written discharge instructions were provided to the                            patient.                           - Resume previous diet today.                           - Continue present medications.                           -  Repeat upper endoscopy in 1 year for esophageal                            variceal surveillance. Thornton Park MD, MD 05/24/2019 2:17:46 PM This report has been signed electronically.

## 2019-05-24 NOTE — Op Note (Addendum)
[place] Patient Name: [name] Procedure Date: 05/24/2019 1:12 PM MRN: [id number] Endoscopist: Thornton Park MD, MD Age: 68 Referring MD:  Date of Birth: 1950-09-20 Gender: Male Account #: [id number] Procedure:                Colonoscopy Indications:              Screening for colorectal malignant neoplasm, This                            is the patient's first colonoscopy                           No known family history of colon cancer or polyps. Medicines:                See the Anesthesia note for documentation of the                            administered medications Procedure:                Pre-Anesthesia Assessment:                           - Prior to the procedure, a History and Physical                            was performed, and patient medications and                            allergies were reviewed. The patient's tolerance of                            previous anesthesia was also reviewed. The risks                            and benefits of the procedure and the sedation                            options and risks were discussed with the patient.                            All questions were answered, and informed consent                            was obtained. Prior Anticoagulants: The patient has                            taken no previous anticoagulant or antiplatelet                            agents. ASA Grade Assessment: III - A patient with                            severe systemic disease. After reviewing the risks  and benefits, the patient was deemed in                            satisfactory condition to undergo the procedure.                           After obtaining informed consent, the colonoscope                            was passed under direct vision. Throughout the                            procedure, the patient's blood pressure, pulse, and                            oxygen saturations  were monitored continuously. The                            Colonoscope was introduced through the anus and                            advanced to the the terminal ileum, with                            identification of the appendiceal orifice and IC                            valve. A second forward view of the right colon was                            performed. The colonoscopy was performed without                            difficulty. The patient tolerated the procedure                            well. The quality of the bowel preparation was                            good. The terminal ileum, ileocecal valve,                            appendiceal orifice, and rectum were photographed. Scope In: 1:48:43 PM Scope Out: 2:07:20 PM Scope Withdrawal Time: 0 hours 12 minutes 34 seconds  Total Procedure Duration: 0 hours 18 minutes 37 seconds  Findings:                 The perianal and digital rectal examinations were                            normal except for a small 74mm perianal pearly                            papule of unclear clinical significance.  Non-bleeding internal hemorrhoids were found. The                            hemorrhoids were small.                           Multiple small and large-mouthed diverticula were                            found in the sigmoid colon and descending colon.                           A 1 mm polyp was found in the ileocecal valve. The                            polyp was sessile. The polyps was on the tip of the                            IC valve and not ammenable to snare polypectomy.                            The polyp was removed with a cold biopsy forceps.                            Resection and retrieval were complete. Estimated                            blood loss: none.                           A 3 mm polyp was found in the descending colon. The                            polyp was sessile and too large  to remove in one                            piece with cold forceps. The polyp was removed with                            a cold snare. Resection and retrieval were                            complete. Estimated blood loss was minimal.                           The exam was otherwise without abnormality on                            direct and retroflexion views. Complications:            No immediate complications. Estimated blood loss:                            Minimal.  Estimated Blood Loss:     Estimated blood loss was minimal. Impression:               - Non-bleeding internal hemorrhoids.                           - Diverticulosis in the sigmoid colon and in the                            descending colon.                           - One 1 mm polyp at the ileocecal valve, removed                            with a cold biopsy forceps. Resected and retrieved.                           - One 3 mm polyp in the descending colon, removed                            with a cold snare. Resected and retrieved.                           - The examination was otherwise normal on direct                            and retroflexion views. Recommendation:           - Patient has a contact number available for                            emergencies. The signs and symptoms of potential                            delayed complications were discussed with the                            patient. Return to normal activities tomorrow.                            Written discharge instructions were provided to the                            patient.                           - High fiber diet.                           - Continue present medications.                           - Await pathology results.                           - Repeat colonoscopy  date to be determined after                            pending pathology results are reviewed for                            surveillance based on pathology  results. Thornton Park MD, MD 05/24/2019 2:21:53 PM This report has been signed electronically.

## 2019-05-24 NOTE — Patient Instructions (Addendum)
Thank you for allowing [place] to care for you today!  Await results of the polyps removed.  Approximately 2 weeks by mail.  Will make recommendation for next colonoscopy at that time  Recommend repeat upper endoscopy in 1 year for surveillance of esophageal varices.    Resume previous medications and diet today.  Return to your normal activities tomorrow.  Recommend a high fiber diet.  Handout given.    YOU HAD AN ENDOSCOPIC PROCEDURE TODAY AT [facility]:   Refer to the procedure report that was given to you for any specific questions about what was found during the examination.  If the procedure report does not answer your questions, please call your gastroenterologist to clarify.  If you requested that your care partner not be given the details of your procedure findings, then the procedure report has been included in a sealed envelope for you to review at your convenience later.  YOU SHOULD EXPECT: Some feelings of bloating in the abdomen. Passage of more gas than usual.  Walking can help get rid of the air that was put into your GI tract during the procedure and reduce the bloating. If you had a lower endoscopy (such as a colonoscopy or flexible sigmoidoscopy) you may notice spotting of blood in your stool or on the toilet paper. If you underwent a bowel prep for your procedure, you may not have a normal bowel movement for a few days.  Please Note:  You might notice some irritation and congestion in your nose or some drainage.  This is from the oxygen used during your procedure.  There is no need for concern and it should clear up in a day or so.  SYMPTOMS TO REPORT IMMEDIATELY:   Following lower endoscopy (colonoscopy or flexible sigmoidoscopy):  Excessive amounts of blood in the stool  Significant tenderness or worsening of abdominal pains  Swelling of the abdomen that is new, acute  Fever of 100F or higher   Following upper endoscopy (EGD)  Vomiting of blood or  coffee ground material  New chest pain or pain under the shoulder blades  Painful or persistently difficult swallowing  New shortness of breath  Fever of 100F or higher  Black, tarry-looking stools  For urgent or emergent issues, a gastroenterologist can be reached at any hour by calling [phone].   DIET:  We do recommend a small meal at first, but then you may proceed to your regular diet.  Drink plenty of fluids but you should avoid alcoholic beverages for 24 hours.  ACTIVITY:  You should plan to take it easy for the rest of today and you should NOT DRIVE or use heavy machinery until tomorrow (because of the sedation medicines used during the test).    FOLLOW UP: Our staff will call the number listed on your records 48-72 hours following your procedure to check on you and address any questions or concerns that you may have regarding the information given to you following your procedure. If we do not reach you, we will leave a message.  We will attempt to reach you two times.  During this call, we will ask if you have developed any symptoms of COVID 19. If you develop any symptoms (ie: fever, flu-like symptoms, shortness of breath, cough etc.) before then, please call [phone].  If you test positive for Covid 19 in the 2 weeks post procedure, please call and report this information to [place].    If any biopsies were taken you  will be contacted by phone or by letter within the next 1-3 weeks.  Please call us at [phone] if you have not heard about the biopsies in 3 weeks.    SIGNATURES/CONFIDENTIALITY: You and/or your care partner have signed paperwork which will be entered into your electronic medical record.  These signatures attest to the fact that that the information above on your After Visit Summary has been reviewed and is understood.  Full responsibility of the confidentiality of this discharge information lies with you and/or your care-partner.

## 2019-05-24 NOTE — Progress Notes (Signed)
PT taken to PACU. Monitors in place. VSS. Report given to RN. 

## 2019-05-28 ENCOUNTER — Telehealth: Payer: Self-pay

## 2019-05-28 NOTE — Telephone Encounter (Signed)
Patient called back and said that nothing is wrong and that everything is good.

## 2019-05-28 NOTE — Telephone Encounter (Signed)
No answer, left message to call back later today, B.Makayah Pauli RN. 

## 2019-05-28 NOTE — Telephone Encounter (Signed)
Follow up call attempted.  NALM  

## 2019-05-31 DIAGNOSIS — M1712 Unilateral primary osteoarthritis, left knee: Secondary | ICD-10-CM | POA: Diagnosis not present

## 2019-05-31 DIAGNOSIS — M1711 Unilateral primary osteoarthritis, right knee: Secondary | ICD-10-CM | POA: Diagnosis not present

## 2019-06-03 ENCOUNTER — Encounter: Payer: Self-pay | Admitting: Gastroenterology

## 2019-06-12 DIAGNOSIS — R69 Illness, unspecified: Secondary | ICD-10-CM | POA: Diagnosis not present

## 2019-06-12 DIAGNOSIS — I8500 Esophageal varices without bleeding: Secondary | ICD-10-CM | POA: Diagnosis not present

## 2019-06-12 DIAGNOSIS — K7469 Other cirrhosis of liver: Secondary | ICD-10-CM | POA: Diagnosis not present

## 2019-06-15 ENCOUNTER — Other Ambulatory Visit: Payer: Self-pay | Admitting: Nurse Practitioner

## 2019-06-15 DIAGNOSIS — K7469 Other cirrhosis of liver: Secondary | ICD-10-CM

## 2019-06-26 DIAGNOSIS — M159 Polyosteoarthritis, unspecified: Secondary | ICD-10-CM | POA: Diagnosis not present

## 2019-06-26 DIAGNOSIS — I6990 Unspecified sequelae of unspecified cerebrovascular disease: Secondary | ICD-10-CM | POA: Diagnosis not present

## 2019-06-26 DIAGNOSIS — B351 Tinea unguium: Secondary | ICD-10-CM | POA: Diagnosis not present

## 2019-06-26 DIAGNOSIS — K648 Other hemorrhoids: Secondary | ICD-10-CM | POA: Diagnosis not present

## 2019-06-26 DIAGNOSIS — Z8371 Family history of colonic polyps: Secondary | ICD-10-CM | POA: Diagnosis not present

## 2019-06-26 DIAGNOSIS — R911 Solitary pulmonary nodule: Secondary | ICD-10-CM | POA: Diagnosis not present

## 2019-06-26 DIAGNOSIS — I119 Hypertensive heart disease without heart failure: Secondary | ICD-10-CM | POA: Diagnosis not present

## 2019-06-26 DIAGNOSIS — Z79899 Other long term (current) drug therapy: Secondary | ICD-10-CM | POA: Diagnosis not present

## 2019-06-26 DIAGNOSIS — R7302 Impaired glucose tolerance (oral): Secondary | ICD-10-CM | POA: Diagnosis not present

## 2019-06-26 DIAGNOSIS — R69 Illness, unspecified: Secondary | ICD-10-CM | POA: Diagnosis not present

## 2019-07-11 ENCOUNTER — Ambulatory Visit
Admission: RE | Admit: 2019-07-11 | Discharge: 2019-07-11 | Disposition: A | Discharge status: Home / Self Care | Payer: Medicare HMO | Source: Ambulatory Visit | Attending: Nurse Practitioner | Admitting: Nurse Practitioner

## 2019-07-11 DIAGNOSIS — K7469 Other cirrhosis of liver: Secondary | ICD-10-CM

## 2019-07-11 DIAGNOSIS — R69 Illness, unspecified: Secondary | ICD-10-CM | POA: Diagnosis not present

## 2019-07-11 DIAGNOSIS — K8020 Calculus of gallbladder without cholecystitis without obstruction: Secondary | ICD-10-CM | POA: Diagnosis not present

## 2019-07-11 DIAGNOSIS — K838 Other specified diseases of biliary tract: Secondary | ICD-10-CM | POA: Diagnosis not present

## 2019-07-20 DIAGNOSIS — R69 Illness, unspecified: Secondary | ICD-10-CM | POA: Diagnosis not present

## 2019-07-20 DIAGNOSIS — K7469 Other cirrhosis of liver: Secondary | ICD-10-CM | POA: Diagnosis not present

## 2019-08-27 DIAGNOSIS — G8929 Other chronic pain: Secondary | ICD-10-CM | POA: Diagnosis not present

## 2019-08-27 DIAGNOSIS — M1990 Unspecified osteoarthritis, unspecified site: Secondary | ICD-10-CM | POA: Diagnosis not present

## 2019-08-27 DIAGNOSIS — I10 Essential (primary) hypertension: Secondary | ICD-10-CM | POA: Diagnosis not present

## 2019-08-27 DIAGNOSIS — K5900 Constipation, unspecified: Secondary | ICD-10-CM | POA: Diagnosis not present

## 2019-08-27 DIAGNOSIS — I69331 Monoplegia of upper limb following cerebral infarction affecting right dominant side: Secondary | ICD-10-CM | POA: Diagnosis not present

## 2019-08-27 DIAGNOSIS — R69 Illness, unspecified: Secondary | ICD-10-CM | POA: Diagnosis not present

## 2019-08-27 DIAGNOSIS — N400 Enlarged prostate without lower urinary tract symptoms: Secondary | ICD-10-CM | POA: Diagnosis not present

## 2019-08-27 DIAGNOSIS — K739 Chronic hepatitis, unspecified: Secondary | ICD-10-CM | POA: Diagnosis not present

## 2019-08-27 DIAGNOSIS — E785 Hyperlipidemia, unspecified: Secondary | ICD-10-CM | POA: Diagnosis not present

## 2019-08-27 DIAGNOSIS — Z008 Encounter for other general examination: Secondary | ICD-10-CM | POA: Diagnosis not present

## 2019-08-30 DIAGNOSIS — R69 Illness, unspecified: Secondary | ICD-10-CM | POA: Diagnosis not present

## 2019-08-30 DIAGNOSIS — K7469 Other cirrhosis of liver: Secondary | ICD-10-CM | POA: Diagnosis not present

## 2019-09-10 DIAGNOSIS — K7469 Other cirrhosis of liver: Secondary | ICD-10-CM | POA: Diagnosis not present

## 2019-09-10 DIAGNOSIS — K838 Other specified diseases of biliary tract: Secondary | ICD-10-CM | POA: Diagnosis not present

## 2019-09-10 DIAGNOSIS — R69 Illness, unspecified: Secondary | ICD-10-CM | POA: Diagnosis not present

## 2019-10-23 DIAGNOSIS — K648 Other hemorrhoids: Secondary | ICD-10-CM | POA: Diagnosis not present

## 2019-10-23 DIAGNOSIS — Z125 Encounter for screening for malignant neoplasm of prostate: Secondary | ICD-10-CM | POA: Diagnosis not present

## 2019-10-23 DIAGNOSIS — I6990 Unspecified sequelae of unspecified cerebrovascular disease: Secondary | ICD-10-CM | POA: Diagnosis not present

## 2019-10-23 DIAGNOSIS — K738 Other chronic hepatitis, not elsewhere classified: Secondary | ICD-10-CM | POA: Diagnosis not present

## 2019-10-23 DIAGNOSIS — Z8371 Family history of colonic polyps: Secondary | ICD-10-CM | POA: Diagnosis not present

## 2019-10-23 DIAGNOSIS — E785 Hyperlipidemia, unspecified: Secondary | ICD-10-CM | POA: Diagnosis not present

## 2019-10-23 DIAGNOSIS — M159 Polyosteoarthritis, unspecified: Secondary | ICD-10-CM | POA: Diagnosis not present

## 2019-10-23 DIAGNOSIS — R7302 Impaired glucose tolerance (oral): Secondary | ICD-10-CM | POA: Diagnosis not present

## 2019-10-23 DIAGNOSIS — Z79899 Other long term (current) drug therapy: Secondary | ICD-10-CM | POA: Diagnosis not present

## 2019-10-23 DIAGNOSIS — I119 Hypertensive heart disease without heart failure: Secondary | ICD-10-CM | POA: Diagnosis not present

## 2019-10-23 DIAGNOSIS — R911 Solitary pulmonary nodule: Secondary | ICD-10-CM | POA: Diagnosis not present

## 2019-12-03 DIAGNOSIS — Z86010 Personal history of colonic polyps: Secondary | ICD-10-CM | POA: Diagnosis not present

## 2019-12-03 DIAGNOSIS — Z79899 Other long term (current) drug therapy: Secondary | ICD-10-CM | POA: Diagnosis not present

## 2019-12-03 DIAGNOSIS — E785 Hyperlipidemia, unspecified: Secondary | ICD-10-CM | POA: Diagnosis not present

## 2019-12-03 DIAGNOSIS — K738 Other chronic hepatitis, not elsewhere classified: Secondary | ICD-10-CM | POA: Diagnosis not present

## 2019-12-03 DIAGNOSIS — R7302 Impaired glucose tolerance (oral): Secondary | ICD-10-CM | POA: Diagnosis not present

## 2019-12-03 DIAGNOSIS — M159 Polyosteoarthritis, unspecified: Secondary | ICD-10-CM | POA: Diagnosis not present

## 2019-12-03 DIAGNOSIS — R911 Solitary pulmonary nodule: Secondary | ICD-10-CM | POA: Diagnosis not present

## 2019-12-03 DIAGNOSIS — I6990 Unspecified sequelae of unspecified cerebrovascular disease: Secondary | ICD-10-CM | POA: Diagnosis not present

## 2019-12-03 DIAGNOSIS — I119 Hypertensive heart disease without heart failure: Secondary | ICD-10-CM | POA: Diagnosis not present

## 2019-12-03 DIAGNOSIS — K648 Other hemorrhoids: Secondary | ICD-10-CM | POA: Diagnosis not present

## 2019-12-10 DIAGNOSIS — R32 Unspecified urinary incontinence: Secondary | ICD-10-CM | POA: Diagnosis not present

## 2020-01-09 DIAGNOSIS — R32 Unspecified urinary incontinence: Secondary | ICD-10-CM | POA: Diagnosis not present

## 2020-01-11 DIAGNOSIS — R69 Illness, unspecified: Secondary | ICD-10-CM | POA: Diagnosis not present

## 2020-01-14 DIAGNOSIS — H5203 Hypermetropia, bilateral: Secondary | ICD-10-CM | POA: Diagnosis not present

## 2020-03-10 DIAGNOSIS — R32 Unspecified urinary incontinence: Secondary | ICD-10-CM | POA: Diagnosis not present

## 2020-03-20 DIAGNOSIS — R911 Solitary pulmonary nodule: Secondary | ICD-10-CM | POA: Diagnosis not present

## 2020-03-20 DIAGNOSIS — I6990 Unspecified sequelae of unspecified cerebrovascular disease: Secondary | ICD-10-CM | POA: Diagnosis not present

## 2020-03-20 DIAGNOSIS — E785 Hyperlipidemia, unspecified: Secondary | ICD-10-CM | POA: Diagnosis not present

## 2020-03-20 DIAGNOSIS — R32 Unspecified urinary incontinence: Secondary | ICD-10-CM | POA: Diagnosis not present

## 2020-03-20 DIAGNOSIS — M1990 Unspecified osteoarthritis, unspecified site: Secondary | ICD-10-CM | POA: Diagnosis not present

## 2020-03-20 DIAGNOSIS — R7302 Impaired glucose tolerance (oral): Secondary | ICD-10-CM | POA: Diagnosis not present

## 2020-03-20 DIAGNOSIS — K739 Chronic hepatitis, unspecified: Secondary | ICD-10-CM | POA: Diagnosis not present

## 2020-03-20 DIAGNOSIS — Z86010 Personal history of colonic polyps: Secondary | ICD-10-CM | POA: Diagnosis not present

## 2020-03-20 DIAGNOSIS — Z79899 Other long term (current) drug therapy: Secondary | ICD-10-CM | POA: Diagnosis not present

## 2020-03-20 DIAGNOSIS — K648 Other hemorrhoids: Secondary | ICD-10-CM | POA: Diagnosis not present

## 2020-03-20 DIAGNOSIS — I119 Hypertensive heart disease without heart failure: Secondary | ICD-10-CM | POA: Diagnosis not present

## 2020-04-05 DIAGNOSIS — R69 Illness, unspecified: Secondary | ICD-10-CM | POA: Diagnosis not present

## 2020-04-09 ENCOUNTER — Other Ambulatory Visit: Payer: Self-pay

## 2020-04-09 ENCOUNTER — Ambulatory Visit (HOSPITAL_COMMUNITY)
Admission: RE | Admit: 2020-04-09 | Discharge: 2020-04-09 | Disposition: A | Discharge status: Home / Self Care | Payer: Medicare HMO | Source: Ambulatory Visit | Attending: Pulmonary Disease | Admitting: Pulmonary Disease

## 2020-04-09 ENCOUNTER — Other Ambulatory Visit (HOSPITAL_COMMUNITY): Payer: Self-pay | Admitting: Pulmonary Disease

## 2020-04-09 DIAGNOSIS — M5440 Lumbago with sciatica, unspecified side: Secondary | ICD-10-CM | POA: Diagnosis present

## 2020-04-09 DIAGNOSIS — M545 Low back pain: Secondary | ICD-10-CM | POA: Diagnosis not present

## 2020-04-09 DIAGNOSIS — R911 Solitary pulmonary nodule: Secondary | ICD-10-CM | POA: Diagnosis not present

## 2020-04-21 ENCOUNTER — Other Ambulatory Visit: Payer: Self-pay | Admitting: Nurse Practitioner

## 2020-04-21 DIAGNOSIS — R69 Illness, unspecified: Secondary | ICD-10-CM | POA: Diagnosis not present

## 2020-04-21 DIAGNOSIS — K7469 Other cirrhosis of liver: Secondary | ICD-10-CM | POA: Diagnosis not present

## 2020-04-21 DIAGNOSIS — I8500 Esophageal varices without bleeding: Secondary | ICD-10-CM | POA: Diagnosis not present

## 2020-04-21 DIAGNOSIS — K7460 Unspecified cirrhosis of liver: Secondary | ICD-10-CM

## 2020-04-29 ENCOUNTER — Ambulatory Visit
Admission: RE | Admit: 2020-04-29 | Discharge: 2020-04-29 | Disposition: A | Discharge status: Home / Self Care | Payer: Medicare HMO | Source: Ambulatory Visit | Attending: Nurse Practitioner | Admitting: Nurse Practitioner

## 2020-04-29 DIAGNOSIS — R69 Illness, unspecified: Secondary | ICD-10-CM | POA: Diagnosis not present

## 2020-04-29 DIAGNOSIS — K7460 Unspecified cirrhosis of liver: Secondary | ICD-10-CM

## 2020-04-29 DIAGNOSIS — K8020 Calculus of gallbladder without cholecystitis without obstruction: Secondary | ICD-10-CM | POA: Diagnosis not present

## 2020-05-07 DIAGNOSIS — R32 Unspecified urinary incontinence: Secondary | ICD-10-CM | POA: Diagnosis not present

## 2020-05-22 DIAGNOSIS — K7469 Other cirrhosis of liver: Secondary | ICD-10-CM | POA: Diagnosis not present

## 2020-05-22 DIAGNOSIS — R69 Illness, unspecified: Secondary | ICD-10-CM | POA: Diagnosis not present

## 2020-07-07 DIAGNOSIS — R32 Unspecified urinary incontinence: Secondary | ICD-10-CM | POA: Diagnosis not present

## 2020-07-15 DIAGNOSIS — M1990 Unspecified osteoarthritis, unspecified site: Secondary | ICD-10-CM | POA: Diagnosis not present

## 2020-07-15 DIAGNOSIS — R7302 Impaired glucose tolerance (oral): Secondary | ICD-10-CM | POA: Diagnosis not present

## 2020-07-15 DIAGNOSIS — Z86010 Personal history of colonic polyps: Secondary | ICD-10-CM | POA: Diagnosis not present

## 2020-07-15 DIAGNOSIS — I6990 Unspecified sequelae of unspecified cerebrovascular disease: Secondary | ICD-10-CM | POA: Diagnosis not present

## 2020-07-15 DIAGNOSIS — Z79899 Other long term (current) drug therapy: Secondary | ICD-10-CM | POA: Diagnosis not present

## 2020-07-15 DIAGNOSIS — Z0001 Encounter for general adult medical examination with abnormal findings: Secondary | ICD-10-CM | POA: Diagnosis not present

## 2020-07-15 DIAGNOSIS — R911 Solitary pulmonary nodule: Secondary | ICD-10-CM | POA: Diagnosis not present

## 2020-07-15 DIAGNOSIS — E7849 Other hyperlipidemia: Secondary | ICD-10-CM | POA: Diagnosis not present

## 2020-07-15 DIAGNOSIS — I119 Hypertensive heart disease without heart failure: Secondary | ICD-10-CM | POA: Diagnosis not present

## 2020-07-15 DIAGNOSIS — K648 Other hemorrhoids: Secondary | ICD-10-CM | POA: Diagnosis not present

## 2020-08-03 ENCOUNTER — Encounter (HOSPITAL_COMMUNITY): Payer: Self-pay

## 2020-08-03 ENCOUNTER — Inpatient Hospital Stay (HOSPITAL_COMMUNITY): Payer: Medicare HMO

## 2020-08-03 ENCOUNTER — Inpatient Hospital Stay (HOSPITAL_COMMUNITY)
Admission: EM | Admit: 2020-08-03 | Discharge: 2020-08-08 | DRG: 481 | Disposition: A | Discharge status: Skilled Nursing Facility | Payer: Medicare HMO | Attending: Internal Medicine | Admitting: Internal Medicine

## 2020-08-03 ENCOUNTER — Emergency Department (HOSPITAL_COMMUNITY): Payer: Medicare HMO

## 2020-08-03 ENCOUNTER — Other Ambulatory Visit: Payer: Self-pay

## 2020-08-03 DIAGNOSIS — R609 Edema, unspecified: Secondary | ICD-10-CM | POA: Diagnosis not present

## 2020-08-03 DIAGNOSIS — R69 Illness, unspecified: Secondary | ICD-10-CM | POA: Diagnosis not present

## 2020-08-03 DIAGNOSIS — Z20822 Contact with and (suspected) exposure to covid-19: Secondary | ICD-10-CM | POA: Diagnosis present

## 2020-08-03 DIAGNOSIS — S72001A Fracture of unspecified part of neck of right femur, initial encounter for closed fracture: Secondary | ICD-10-CM | POA: Diagnosis present

## 2020-08-03 DIAGNOSIS — W1830XA Fall on same level, unspecified, initial encounter: Secondary | ICD-10-CM | POA: Diagnosis present

## 2020-08-03 DIAGNOSIS — I959 Hypotension, unspecified: Secondary | ICD-10-CM | POA: Diagnosis not present

## 2020-08-03 DIAGNOSIS — I10 Essential (primary) hypertension: Secondary | ICD-10-CM | POA: Diagnosis present

## 2020-08-03 DIAGNOSIS — H409 Unspecified glaucoma: Secondary | ICD-10-CM | POA: Diagnosis present

## 2020-08-03 DIAGNOSIS — Z79899 Other long term (current) drug therapy: Secondary | ICD-10-CM | POA: Diagnosis not present

## 2020-08-03 DIAGNOSIS — S7291XA Unspecified fracture of right femur, initial encounter for closed fracture: Secondary | ICD-10-CM | POA: Diagnosis not present

## 2020-08-03 DIAGNOSIS — Z823 Family history of stroke: Secondary | ICD-10-CM

## 2020-08-03 DIAGNOSIS — Z86010 Personal history of colonic polyps: Secondary | ICD-10-CM

## 2020-08-03 DIAGNOSIS — Z419 Encounter for procedure for purposes other than remedying health state, unspecified: Secondary | ICD-10-CM

## 2020-08-03 DIAGNOSIS — I69351 Hemiplegia and hemiparesis following cerebral infarction affecting right dominant side: Secondary | ICD-10-CM | POA: Diagnosis not present

## 2020-08-03 DIAGNOSIS — E785 Hyperlipidemia, unspecified: Secondary | ICD-10-CM | POA: Diagnosis not present

## 2020-08-03 DIAGNOSIS — Z7982 Long term (current) use of aspirin: Secondary | ICD-10-CM | POA: Diagnosis not present

## 2020-08-03 DIAGNOSIS — R52 Pain, unspecified: Secondary | ICD-10-CM | POA: Diagnosis not present

## 2020-08-03 DIAGNOSIS — M1990 Unspecified osteoarthritis, unspecified site: Secondary | ICD-10-CM | POA: Diagnosis present

## 2020-08-03 DIAGNOSIS — N400 Enlarged prostate without lower urinary tract symptoms: Secondary | ICD-10-CM | POA: Diagnosis not present

## 2020-08-03 DIAGNOSIS — W19XXXA Unspecified fall, initial encounter: Secondary | ICD-10-CM | POA: Diagnosis not present

## 2020-08-03 DIAGNOSIS — Z01818 Encounter for other preprocedural examination: Secondary | ICD-10-CM | POA: Diagnosis not present

## 2020-08-03 DIAGNOSIS — Z8249 Family history of ischemic heart disease and other diseases of the circulatory system: Secondary | ICD-10-CM

## 2020-08-03 DIAGNOSIS — Z87891 Personal history of nicotine dependence: Secondary | ICD-10-CM | POA: Diagnosis not present

## 2020-08-03 DIAGNOSIS — B182 Chronic viral hepatitis C: Secondary | ICD-10-CM | POA: Diagnosis present

## 2020-08-03 DIAGNOSIS — R509 Fever, unspecified: Secondary | ICD-10-CM | POA: Diagnosis not present

## 2020-08-03 DIAGNOSIS — Y9259 Other trade areas as the place of occurrence of the external cause: Secondary | ICD-10-CM | POA: Diagnosis not present

## 2020-08-03 DIAGNOSIS — S72141A Displaced intertrochanteric fracture of right femur, initial encounter for closed fracture: Principal | ICD-10-CM | POA: Diagnosis present

## 2020-08-03 DIAGNOSIS — D72819 Decreased white blood cell count, unspecified: Secondary | ICD-10-CM | POA: Diagnosis not present

## 2020-08-03 DIAGNOSIS — B1920 Unspecified viral hepatitis C without hepatic coma: Secondary | ICD-10-CM | POA: Diagnosis present

## 2020-08-03 DIAGNOSIS — K7460 Unspecified cirrhosis of liver: Secondary | ICD-10-CM | POA: Diagnosis not present

## 2020-08-03 DIAGNOSIS — Z96649 Presence of unspecified artificial hip joint: Secondary | ICD-10-CM

## 2020-08-03 DIAGNOSIS — I700 Atherosclerosis of aorta: Secondary | ICD-10-CM | POA: Diagnosis not present

## 2020-08-03 DIAGNOSIS — Z8673 Personal history of transient ischemic attack (TIA), and cerebral infarction without residual deficits: Secondary | ICD-10-CM

## 2020-08-03 DIAGNOSIS — M25551 Pain in right hip: Secondary | ICD-10-CM | POA: Diagnosis not present

## 2020-08-03 DIAGNOSIS — D72829 Elevated white blood cell count, unspecified: Secondary | ICD-10-CM

## 2020-08-03 DIAGNOSIS — S72001D Fracture of unspecified part of neck of right femur, subsequent encounter for closed fracture with routine healing: Secondary | ICD-10-CM | POA: Diagnosis not present

## 2020-08-03 DIAGNOSIS — K7469 Other cirrhosis of liver: Secondary | ICD-10-CM | POA: Diagnosis not present

## 2020-08-03 DIAGNOSIS — S72091A Other fracture of head and neck of right femur, initial encounter for closed fracture: Secondary | ICD-10-CM | POA: Diagnosis not present

## 2020-08-03 DIAGNOSIS — Z9889 Other specified postprocedural states: Secondary | ICD-10-CM | POA: Diagnosis not present

## 2020-08-03 DIAGNOSIS — J8410 Pulmonary fibrosis, unspecified: Secondary | ICD-10-CM | POA: Diagnosis not present

## 2020-08-03 DIAGNOSIS — S72121A Displaced fracture of lesser trochanter of right femur, initial encounter for closed fracture: Secondary | ICD-10-CM | POA: Diagnosis not present

## 2020-08-03 DIAGNOSIS — S72041D Displaced fracture of base of neck of right femur, subsequent encounter for closed fracture with routine healing: Secondary | ICD-10-CM | POA: Diagnosis not present

## 2020-08-03 LAB — CBC WITH DIFFERENTIAL/PLATELET
Abs Immature Granulocytes: 0.52 10*3/uL — ABNORMAL HIGH (ref 0.00–0.07)
Basophils Absolute: 0.1 10*3/uL (ref 0.0–0.1)
Basophils Relative: 1 %
Eosinophils Absolute: 0.1 10*3/uL (ref 0.0–0.5)
Eosinophils Relative: 1 %
HCT: 25.8 % — ABNORMAL LOW (ref 39.0–52.0)
Hemoglobin: 8 g/dL — ABNORMAL LOW (ref 13.0–17.0)
Immature Granulocytes: 5 %
Lymphocytes Relative: 11 %
Lymphs Abs: 1.1 10*3/uL (ref 0.7–4.0)
MCH: 30.2 pg (ref 26.0–34.0)
MCHC: 31 g/dL (ref 30.0–36.0)
MCV: 97.4 fL (ref 80.0–100.0)
Monocytes Absolute: 0.9 10*3/uL (ref 0.1–1.0)
Monocytes Relative: 9 %
Neutro Abs: 7.5 10*3/uL (ref 1.7–7.7)
Neutrophils Relative %: 73 %
Platelets: 275 10*3/uL (ref 150–400)
RBC: 2.65 MIL/uL — ABNORMAL LOW (ref 4.22–5.81)
RDW: 15.6 % — ABNORMAL HIGH (ref 11.5–15.5)
WBC: 10.2 10*3/uL (ref 4.0–10.5)
nRBC: 0 % (ref 0.0–0.2)

## 2020-08-03 LAB — BASIC METABOLIC PANEL
Anion gap: 12 (ref 5–15)
BUN: 36 mg/dL — ABNORMAL HIGH (ref 8–23)
CO2: 26 mmol/L (ref 22–32)
Calcium: 8.9 mg/dL (ref 8.9–10.3)
Chloride: 98 mmol/L (ref 98–111)
Creatinine, Ser: 1.25 mg/dL — ABNORMAL HIGH (ref 0.61–1.24)
GFR, Estimated: >60 mL/min (ref >60)
Glucose, Bld: 114 mg/dL — ABNORMAL HIGH (ref 70–99)
Potassium: 3.7 mmol/L (ref 3.5–5.1)
Sodium: 136 mmol/L (ref 135–145)

## 2020-08-03 LAB — RESP PANEL BY RT-PCR (FLU A&B, COVID) ARPGX2
Influenza A by PCR: NEGATIVE
Influenza B by PCR: NEGATIVE
SARS Coronavirus 2 by RT PCR: NEGATIVE

## 2020-08-03 LAB — PROTIME-INR
INR: 1.1 (ref 0.8–1.2)
Prothrombin Time: 14.1 seconds (ref 11.4–15.2)

## 2020-08-03 LAB — CK: Total CK: 925 U/L — ABNORMAL HIGH (ref 49–397)

## 2020-08-03 LAB — ABO/RH: ABO/RH(D): O POS

## 2020-08-03 MED ORDER — CYCLOSPORINE 0.05 % OP EMUL
1.0000 [drp] | Freq: Two times a day (BID) | OPHTHALMIC | Status: DC
  Administered 2020-08-03 – 2020-08-08 (×8): 1 [drp] via OPHTHALMIC
  Filled 2020-08-03 (×11): qty 1

## 2020-08-03 MED ORDER — ATORVASTATIN CALCIUM 10 MG PO TABS
10.0000 mg | ORAL_TABLET | Freq: Every day | ORAL | Status: DC
  Administered 2020-08-05 – 2020-08-07 (×3): 10 mg via ORAL
  Filled 2020-08-03 (×4): qty 1

## 2020-08-03 MED ORDER — METOPROLOL TARTRATE 12.5 MG HALF TABLET
12.5000 mg | ORAL_TABLET | Freq: Two times a day (BID) | ORAL | Status: DC
  Administered 2020-08-03 – 2020-08-08 (×10): 12.5 mg via ORAL
  Filled 2020-08-03 (×11): qty 1

## 2020-08-03 MED ORDER — DOCUSATE SODIUM 100 MG PO CAPS
100.0000 mg | ORAL_CAPSULE | Freq: Two times a day (BID) | ORAL | Status: DC
  Administered 2020-08-03 – 2020-08-08 (×9): 100 mg via ORAL
  Filled 2020-08-03 (×9): qty 1

## 2020-08-03 MED ORDER — METHOCARBAMOL 500 MG PO TABS
500.0000 mg | ORAL_TABLET | Freq: Four times a day (QID) | ORAL | Status: DC | PRN
  Administered 2020-08-03: 500 mg via ORAL
  Filled 2020-08-03: qty 1

## 2020-08-03 MED ORDER — LISINOPRIL 10 MG PO TABS
10.0000 mg | ORAL_TABLET | Freq: Every day | ORAL | Status: DC
  Administered 2020-08-04 – 2020-08-08 (×5): 10 mg via ORAL
  Filled 2020-08-03 (×5): qty 1

## 2020-08-03 MED ORDER — SODIUM CHLORIDE 0.9 % IV BOLUS
500.0000 mL | Freq: Once | INTRAVENOUS | Status: AC
  Administered 2020-08-03: 500 mL via INTRAVENOUS

## 2020-08-03 MED ORDER — FENTANYL CITRATE (PF) 100 MCG/2ML IJ SOLN
50.0000 ug | INTRAMUSCULAR | Status: AC | PRN
  Administered 2020-08-03 (×2): 50 ug via INTRAVENOUS
  Filled 2020-08-03 (×2): qty 2

## 2020-08-03 MED ORDER — MORPHINE SULFATE (PF) 2 MG/ML IV SOLN
0.5000 mg | INTRAVENOUS | Status: DC | PRN
  Administered 2020-08-03 – 2020-08-04 (×3): 0.5 mg via INTRAVENOUS
  Filled 2020-08-03 (×3): qty 1

## 2020-08-03 MED ORDER — HYDROCODONE-ACETAMINOPHEN 5-325 MG PO TABS: 1.0000 | ORAL_TABLET | Freq: Four times a day (QID) | ORAL | Status: DC | PRN

## 2020-08-03 MED ORDER — BISACODYL 5 MG PO TBEC: 5.0000 mg | DELAYED_RELEASE_TABLET | Freq: Every day | ORAL | Status: DC | PRN

## 2020-08-03 MED ORDER — TAMSULOSIN HCL 0.4 MG PO CAPS
0.4000 mg | ORAL_CAPSULE | Freq: Every day | ORAL | Status: DC
  Administered 2020-08-03 – 2020-08-07 (×5): 0.4 mg via ORAL
  Filled 2020-08-03 (×5): qty 1

## 2020-08-03 MED ORDER — ACETAMINOPHEN 325 MG PO TABS: 650.0000 mg | ORAL_TABLET | Freq: Four times a day (QID) | ORAL | Status: DC | PRN

## 2020-08-03 MED ORDER — METHOCARBAMOL 500 MG PO TABS: 500.0000 mg | ORAL_TABLET | Freq: Four times a day (QID) | ORAL | Status: DC | PRN

## 2020-08-03 MED ORDER — METHOCARBAMOL 1000 MG/10ML IJ SOLN
500.0000 mg | Freq: Four times a day (QID) | INTRAVENOUS | Status: DC | PRN
  Filled 2020-08-03: qty 5

## 2020-08-03 MED ORDER — ONDANSETRON HCL 4 MG/2ML IJ SOLN: 4.0000 mg | Freq: Four times a day (QID) | INTRAMUSCULAR | Status: DC | PRN

## 2020-08-03 MED ORDER — POLYETHYLENE GLYCOL 3350 17 G PO PACK: 17.0000 g | PACK | Freq: Every day | ORAL | Status: DC | PRN

## 2020-08-03 MED ORDER — AMLODIPINE BESYLATE 10 MG PO TABS
10.0000 mg | ORAL_TABLET | Freq: Every morning | ORAL | Status: DC
  Administered 2020-08-05 – 2020-08-08 (×4): 10 mg via ORAL
  Filled 2020-08-03 (×4): qty 1

## 2020-08-03 NOTE — ED Notes (Signed)
Patient transported to CT 

## 2020-08-03 NOTE — ED Notes (Addendum)
Pt given 0.5mg  morphine for 10/10 pain in R leg

## 2020-08-03 NOTE — H&P (Addendum)
History and Physical    [name] [id number] DOB: 1950/11/20 DOA: 08/03/2020  PCP: Vincente Liberty, MD Consultants:  Brayton El - hepatology; [facility] - GI; Ninfa Linden - orthopedics Patient coming from:  Home - lives with sister; NOK: [name], sister, [phone], [phone]  Chief Complaint: fall  HPI: [name] is a 70 y.o. male with medical history significant of CVA; HTN; HLD; and Hep C cirrhosis presenting with a fall on 12/23 while visiting family in [place].  He was on vacation in [place], [place], and visiting his niece.  The last time he was there, he went for a walk with his walker.  He hit an uneven spot and fell.  He landed on his R hip.  Some people helped him up and he was able to walk a little with his walker.  His sister got him back to the room.  He took the train back on 12/28.  They filed a claim against the hotel and were waiting on that to get settled before he came in.  He has not been able to his ADLs.    ED Course:  Hip fracture x 10 days.  Surgery with Dr. Alvan Dame tomorrow.  Buck's traction, Robaxin ordered.  On ASA, not on AC.  Triphasic pulses with doppler.  Review of Systems: As per HPI; otherwise review of systems reviewed and negative.   Ambulatory Status:  Ambulates with a walker  COVID Vaccine Status:   Complete plus booster  Past Medical History:  Diagnosis Date  . Arthritis   . BPH (benign prostatic hyperplasia)   . Cataracts, bilateral   . Cirrhosis ([place])    due to hepatitis C  . Colon polyps   . Glaucoma   . Hyperlipidemia   . Hypertension   . Stroke [facility]) 2014    Past Surgical History:  Procedure Laterality Date  . INGUINAL HERNIA REPAIR Right 1988  . KNEE SURGERY Left 1980's   x 4    Social History   Socioeconomic History  . Marital status: Single    Spouse name: Not on file  . Number of children: 0  . Years of education: 51  . Highest education level: Not on file  Occupational History  . Occupation:  disabled  Tobacco Use  . Smoking status: Former Smoker    Types: Cigarettes    Quit date: 09/26/1983    Years since quitting: 36.8  . Smokeless tobacco: Never Used  Vaping Use  . Vaping Use: Never used  Substance and Sexual Activity  . 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 Has not had any alcohol since 1983.   . 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 Sexual activity: Not on file  Other Topics Concern  . Not on file  Social History Narrative   Lives with sister, on disability, no children   Caffeine use - none   Right handed   Social Determinants of Health   Financial Resource Strain: Not on file  Food Insecurity: Not on file  Transportation Needs: Not on file  Physical Activity: Not on file  Stress: Not on file  Social Connections: Not on file  Intimate Partner Violence: Not on file    No Known Allergies  Family History  Problem Relation Age of Onset  . Stroke Mother   .  Lung cancer Mother            . Heart disease Mother   . Liver cancer Father   . Hypertension Sister   . Thyroid disease Sister   . Diabetes Mellitus II Brother   . Stuttering Other   . Cirrhosis Maternal Aunt     Prior to Admission medications   Medication Sig Start Date End Date Taking? Authorizing Provider  acetaminophen (TYLENOL) 325 MG tablet Take 650 mg by mouth every 6 (six) hours as needed.    [provider]  aspirin 325 MG tablet Take 325 mg by mouth daily.    [provider]  atorvastatin (LIPITOR) 10 MG tablet Take 10 mg by mouth daily.    [provider]  B Complex-C (SUPER B COMPLEX PO) Take by mouth.    [provider]  chlorthalidone (HYGROTON) 25 MG tablet 25 mg as needed (0.5-1 tab prn).  12/15/14   [provider]  lisinopril (PRINIVIL,ZESTRIL) 10 MG tablet Take 10 mg by mouth daily.     [provider]  metoprolol (LOPRESSOR) 50 MG tablet Take 50 mg by mouth 2 (two) times daily.    [provider]  potassium chloride (K-DUR) 10 MEQ tablet Take 10 mEq by mouth daily.    [provider]  Sofosbuvir-Velpatasvir (EPCLUSA) 400-100 MG TABS Take by mouth daily.    [provider]  TURMERIC PO Take by mouth daily.    [provider]  VOLTAREN 1 % GEL as needed.  11/21/14   [provider]    Physical Exam: Vitals:   08/03/20 1407 08/03/20 1413  BP: 128/67   Pulse: 76   Resp: 16   Temp: 98 F (36.7 C)   TempSrc: Oral   SpO2: 100%   Weight:  79.8 kg  Height:  5\' 7"  (1.702 m)     . General:  Appears calm and comfortable and is in NAD . Eyes:  PERRL, EOMI, normal lids, iris . ENT:  grossly normal hearing, lips & tongue, mmm; poor/absent dentition . Neck:  no LAD, masses or thyromegaly . Cardiovascular:  RRR, no m/r/g.   . Respiratory:   CTA bilaterally with no wheezes/rales/rhonchi.  Normal respiratory effort. . Abdomen:  soft, NT, ND, NABS . Skin:  Marked ecchymosis of entirety of RLE . Musculoskeletal:  RLE is externally rotated and diffuse edematous; appears to be shortened compared to L       . Lower extremity:  No LE edema.  Limited foot exam with no ulcerations.  Dooplerable brisk distal pulses. [name] Psychiatric:  grossly normal mood and affect, speech fluent and appropriate, AOx3 . Neurologic:  CN 2-12 grossly intact    Radiological Exams on Admission: Independently reviewed - see discussion in A/P where applicable  DG Pelvis 1-2 Views  Result Date: 08/03/2020 CLINICAL DATA:  Status post fall with right hip pain. EXAM: PELVIS - 1-2 VIEW COMPARISON:  None. FINDINGS: Comminuted displaced fracture through the intertrochanteric proximal right femur is identified. No other acute fracture or dislocation is noted. IMPRESSION: Comminuted displaced fracture through the intertrochanteric proximal right femur.  Electronically Signed   By: Abelardo Diesel M.D.   On: 08/03/2020 14:44   CT Hip Right Wo Contrast  Result Date: 08/03/2020 CLINICAL DATA:  [place] 07/24/2020. Persistent hip pain. Unable to walk. EXAM: CT OF THE RIGHT HIP WITHOUT CONTRAST TECHNIQUE: Multidetector CT imaging of the right hip was performed according to the standard protocol. Multiplanar CT image reconstructions were also  generated. COMPARISON:  Radiographs, same date. FINDINGS: There is a comminuted and displaced intertrochanteric fracture of the right hip which appears subacute. Early changes of heterotopic ossification are noted and there are bony resorptive changes at the fractures. The lesser trochanter is fractured and displaced. The femoral head is normally located in the acetabulum. No acetabular fracture. The pubic symphysis and right SI joints are intact. No right-sided pelvic fractures. Moderate hematoma surrounding the fracture along with probable muscle injuries. Extensive vascular calcifications are noted. IMPRESSION: 1. Subacute appearing comminuted and displaced intertrochanteric fracture of the right hip. Early changes of heterotopic ossification and bony resorptive changes at the fracture sites. 2. Moderate hematoma surrounding the fracture along with probable muscle injuries. 3. No right-sided pelvic fractures. Electronically Signed   By: Marijo Sanes M.D.   On: 08/03/2020 16:43   DG Femur Min 2 Views Right  Result Date: 08/03/2020 CLINICAL DATA:  Status post fall with right hip pain. EXAM: RIGHT FEMUR 2 VIEWS COMPARISON:  None. FINDINGS: Comminuted displaced intertrochanteric fracture through the right proximal femur is noted. IMPRESSION: Comminuted displaced intertrochanteric fracture through the right proximal femur. Electronically Signed   By: Abelardo Diesel M.D.   On: 08/03/2020 14:43    EKG: pending   Labs on Admission: I have personally reviewed the available labs and imaging studies at the time of the  admission.  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) ([place])   Hip fracture -Patient suffered a mechanical fall on 12/23 and is just seeking medical care for his obvious R hip fracture -Imaging today confirms a comminuted displaced intertrochanteric R proximal femur fracture with surrounding muscular injuries and hematoma -Orthopedics consulted, will repair tomorrow -NPO after midnight in anticipation of surgical repair tomorrow -SCDs overnight, start Lovenox post-operatively (or as per ortho) -CXR and EKG prior to surgery, not done in ER -Pain control with Robxain, Vicodin, and Morphine prn -TOC team consult for rehab placement -Will need PT consult post-operatively -Hip fracture order set utilized  Cirrhosis from Hep C -Hold Epclusa -No concern for decompensation at this time  H/o CVA -Ambulates with a walker -Independent with ADLs prior to fall -Possible mild cognitive impairment  HTN -Continue Lisinopril, Norvasc, Lopressor (increase to BID since once daily dosing for Lopressor is ineffective) -Hold Chlorthalidone for now  HLD -Continue Lipitor    Note: This patient has been tested and is negative for the novel coronavirus COVID-19. The patient has been fully vaccinated against COVID-19.    DVT prophylaxis:  SCDs until approved for Lovenox by orthopedics Code Status:  FULL - confirmed with patient Family Communication: None present; I spoke with his sister by telephone at the time of the evaluation  Disposition Plan:  Home once clinically improved Consults called: Orthopedics; SW, Nutrition; will need PT post-operatively  Admission status: Admit - It is my  clinical opinion that admission to INPATIENT is reasonable and necessary because of the expectation that this patient will require hospital care that crosses at least 2 midnights to treat this condition based on the medical complexity of the problems presented.  Given the aforementioned information, the predictability of an adverse outcome is felt to be significant.    Karmen Bongo MD [facility]  How to contact the [facility] Attending or Consulting provider 7A - 7P or covering provider during after hours 7P -7A, for this patient?  1. Check the care team in [facility] and look for a) attending/consulting [facility] provider listed and b) the [facility] team listed 2. Log into [email or web address] and use [place] [facility]'s universal password to access. If you do not have the password, please contact the hospital operator. 3. Locate the [facility] provider you are looking for under [facility] and page to a number that you can be directly reached. 4. If you still have difficulty reaching the provider, please page the [facility] (Director on Call) for the Hospitalists listed on amion for assistance.   08/03/2020, 6:34 PM

## 2020-08-03 NOTE — ED Notes (Signed)
Pt placed on hospital bed by NT, OT, and this RN

## 2020-08-03 NOTE — Progress Notes (Signed)
Orthopedic Tech Progress Note Patient Details:  [name] Sep 15, 1950 [id number]  Musculoskeletal Traction Type of Traction: Bucks Skin Traction Traction Location: rle Traction Weight: 5 lbs   Post Interventions Patient Tolerated: Well Instructions Provided: Care of device,Adjustment of device   Trinna Post 08/03/2020, 11:05 PM

## 2020-08-03 NOTE — ED Triage Notes (Signed)
Pt BIB [facility] from home. Pt fell in a hotel lobby while visiting family in [place], December 23rd. Pt has not been sen by any physicians since the fall. Pt usually able to perform all ADL's and walk with his walker prior to the fall. Pt has not been able to walk or stand since the fall. Family has been assisting him from chair to bed, etc. Pt rode a train home from [place] with said injury. Right leg is swollen, purple in coloring from hip to his foot and outwardly rotated from his hip down. Femur felt stable with palpation, pt denies any pain to femur area only pain to his Right hip. Unable to palpate pedal pulses may be due to excessive edema, cap refill approx 3 seconds. Pt with good sensation throughout, endorses he can feel EMS touching RLE.  18g LAC 100 mcg Fentanyl  BP 106/68 HR 70 NSR RR 16 98% RA

## 2020-08-03 NOTE — ED Provider Notes (Signed)
[facility] EMERGENCY DEPARTMENT Provider Note   CSN: [id number] Arrival date & time: 08/03/20  1302     History Chief Complaint  Patient presents with  . fall/ hip  . Fall  . Hip Injury    [name] is a 70 y.o. male.  HPI 70 year old male with past medical history significant for arthritis, hepatitis C, HLD, HTN, stroke haven't use a walker now to ambulate at baseline  Patient is presented to the ER today for right hip and proximal thigh pain.  He states that on 23 December he was in [place] was walking with his walker which he uses at his baseline ability to ambulate.  He states that he walked out into gravel and his walker tipped forward causing him to slide and fall onto his right hip.  He states he did not hit his head or lose consciousness.  He states that since that time he has had very severe pain in his right hip.  Has had difficulty walking.  He has difficulty walking baseline secondary to strokes that he has had in the past.  However he states that it has been more painful since his fall.  He states that the swelling and bruising has been relatively unchanged since 2 or 3 days after the initial injury.  I discussed with sister to confirm that he has not been seen elsewhere for the symptoms.  She states that they were originally thinking about going to an urgent care however with the long lines due to COVID-19 they put this off until he came back to [place].  He denies any other associated symptoms.  Denies any shortness of breath, lightheadedness or dizziness.  Denies chest pain.  States that he feels significantly improved after the fentanyl he received by EMS.     Past Medical History:  Diagnosis Date  . Arthritis   . BPH (benign prostatic hyperplasia)   . Cataracts, bilateral   . Cirrhosis ([place])   . Colon polyps   . Glaucoma   . Hepatitis C   . Hyperlipidemia   . Hypertension   . Knee injury    right knee injury   . Stroke  (cerebrum) ([place])   . Stroke [facility]) 2014    Patient Active Problem List   Diagnosis Date Noted  . Unilateral primary osteoarthritis, right knee 04/24/2018  . Unilateral primary osteoarthritis, left knee 04/24/2018  . Gait disturbance, post-stroke 01/21/2015  . Arrhythmia 12/02/2014  . Palpitations 12/02/2014  . Cerebral infarction due to thrombosis of left middle cerebral artery ([place]) 12/02/2014  . HLD (hyperlipidemia) 12/02/2014  . Right hemiparesis ([place]) 09/25/2014  . Other secondary osteoarthritis of both knees 09/25/2014  . Basal ganglia infarction ([place]) 09/25/2014  . Ischemic stroke ([place]) 09/22/2014  . Dyslipidemia 09/22/2014  . Thrombocytopenia ([place]) 09/22/2014  . Essential hypertension   . Stroke ([place]) 09/21/2014  . Uncontrolled hypertension 09/21/2014    Past Surgical History:  Procedure Laterality Date  . INGUINAL HERNIA REPAIR Right 1988  . KNEE SURGERY Left 1980's   x 4       Family History  Problem Relation Age of Onset  . Stroke Mother   . Lung cancer Mother            . Heart disease Mother   . Liver cancer Father   . Hypertension Sister   . Thyroid disease Sister   . Diabetes Mellitus II Brother   . Stuttering Other   . Cirrhosis Maternal Aunt  Social History   Tobacco Use  . Smoking status: Former Smoker    Types: Cigarettes    Quit date: 09/26/1983    Years since quitting: 36.8  . Smokeless tobacco: Never Used  Vaping Use  . Vaping Use: Never used  Substance Use Topics  . 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 Has not had any alcohol since 1983.   . 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 Authorizing Provider  acetaminophen (TYLENOL) 325 MG tablet Take 650 mg by mouth every 6 (six) hours as  needed.    [provider]  aspirin 325 MG tablet Take 325 mg by mouth daily.    [provider]  atorvastatin (LIPITOR) 10 MG tablet Take 10 mg by mouth daily.    [provider]  B Complex-C (SUPER B COMPLEX PO) Take by mouth.    [provider]  chlorthalidone (HYGROTON) 25 MG tablet 25 mg as needed (0.5-1 tab prn).  12/15/14   [provider]  lisinopril (PRINIVIL,ZESTRIL) 10 MG tablet Take 10 mg by mouth daily.    [provider]  metoprolol (LOPRESSOR) 50 MG tablet Take 50 mg by mouth 2 (two) times daily.    [provider]  potassium chloride (K-DUR) 10 MEQ tablet Take 10 mEq by mouth daily.    [provider]  Sofosbuvir-Velpatasvir (EPCLUSA) 400-100 MG TABS Take by mouth daily.    [provider]  TURMERIC PO Take by mouth daily.    [provider]  VOLTAREN 1 % GEL as needed.  11/21/14   [provider]    Allergies    Patient has no known allergies.  Review of Systems   Review of Systems  Constitutional: Negative for chills and fever.  HENT: Negative for congestion.   Respiratory: Negative for shortness of breath.   Cardiovascular: Negative for chest pain.  Gastrointestinal: Negative for abdominal pain.  Musculoskeletal: Negative for neck pain.       Right hip pain, thigh pain, swelling, bruising    Physical Exam Updated Vital Signs BP 128/67 (BP Location: Right Arm)   Pulse 76   Temp 98 F (36.7 C) (Oral)   Resp 16   Ht 5\' 7"  (1.702 m)   Wt 79.8 kg   SpO2 100%   BMI 27.55 kg/m   Physical Exam Vitals and nursing note reviewed.  Constitutional:      General: He is not in acute distress.    Comments: 70 year old man appears his stated age.  HENT:     Head: Normocephalic and atraumatic.     Nose: Nose normal.  Eyes:     General: No scleral icterus. Cardiovascular:     Rate and Rhythm: Normal rate and regular rhythm.     Pulses: Normal pulses.     Heart sounds:  Normal heart sounds.     Comments: Left dorsalis pedis pulses 3+, right dorsalis pedis pulse difficult to palpate confirmed triphasic flow with Doppler Pulmonary:     Effort: Pulmonary effort is normal. No respiratory distress.     Breath sounds: No wheezing.  Abdominal:     Palpations: Abdomen is soft.     Tenderness: There is no abdominal  tenderness.  Musculoskeletal:     Cervical back: Normal range of motion.     Right lower leg: No edema.     Left lower leg: No edema.     Comments: Tenderness to palpation of the right hip and right proximal thigh there is significant bruising over the internal surface of the inner right leg as well as the anterior surface of the thigh.  There is swelling down to the calf and foot  Skin:    General: Skin is warm and dry.     Capillary Refill: Capillary refill takes less than 2 seconds.  Neurological:     Mental Status: He is alert. Mental status is at baseline.     Comments: Sensation intact in bilateral lower extremities.  Psychiatric:        Mood and Affect: Mood normal.        Behavior: Behavior normal.     ED Results / Procedures / Treatments   Labs (all labs ordered are listed, but only abnormal results are displayed) Labs Reviewed  CK - Abnormal; Notable for the following components:      Result Value   Total CK 925 (*)    All other components within normal limits  BASIC METABOLIC PANEL - Abnormal; Notable for the following components:   Glucose, Bld 114 (*)    BUN 36 (*)    Creatinine, Ser 1.25 (*)    All other components within normal limits  CBC WITH DIFFERENTIAL/PLATELET - Abnormal; Notable for the following components:   RBC 2.65 (*)    Hemoglobin 8.0 (*)    HCT 25.8 (*)    RDW 15.6 (*)    Abs Immature Granulocytes 0.52 (*)    All other components within normal limits  RESP PANEL BY RT-PCR (FLU A&B, COVID) ARPGX2  PROTIME-INR  TYPE AND SCREEN    EKG None  Radiology DG Pelvis 1-2 Views  Result Date:  08/03/2020 CLINICAL DATA:  Status post fall with right hip pain. EXAM: PELVIS - 1-2 VIEW COMPARISON:  None. FINDINGS: Comminuted displaced fracture through the intertrochanteric proximal right femur is identified. No other acute fracture or dislocation is noted. IMPRESSION: Comminuted displaced fracture through the intertrochanteric proximal right femur. Electronically Signed   By: Abelardo Diesel M.D.   On: 08/03/2020 14:44   DG Femur Min 2 Views Right  Result Date: 08/03/2020 CLINICAL DATA:  Status post fall with right hip pain. EXAM: RIGHT FEMUR 2 VIEWS COMPARISON:  None. FINDINGS: Comminuted displaced intertrochanteric fracture through the right proximal femur is noted. IMPRESSION: Comminuted displaced intertrochanteric fracture through the right proximal femur. Electronically Signed   By: Abelardo Diesel M.D.   On: 08/03/2020 14:43    Procedures Procedures (including critical care time)  Medications Ordered in ED Medications  fentaNYL (SUBLIMAZE) injection 50 mcg (50 mcg Intravenous Given 08/03/20 1409)  methocarbamol (ROBAXIN) tablet 500 mg (has no administration in time range)  sodium chloride 0.9 % bolus 500 mL (has no administration in time range)    ED Course  I have reviewed the triage vital signs and the nursing notes.  Pertinent labs & imaging results that were available during my care of the patient were reviewed by me and considered in my medical decision making (see chart for details).    MDM Rules/Calculators/A&P                          Patient is 70 year old male who was away out of town  in [place] on the 23rd of the past month/10 days ago when he fell onto his right hip.  He has not been able to walk with his walker since that time.  Physical exam is notable for significant swelling of the right hip with tenderness to palpation.  Extremely high suspicion for fracture.  He definitely has significant ecchymosis in the right anterior medial thigh.  Attending physician assessed  patient at bedside.  Will obtain x-ray imaging of the right hip and pelvis and femur. Notably patient does have good triphasic pulses in the right foot with Doppler.  Suspect that difficulty palpating a manual pulse is secondary to swelling.  CBC shows anemia hemoglobin of 8.  This will need to be trended.  I discussed this with Dr. Francia Greaves.  BMP with perhaps some evidence of dehydration with elevated BUN patient has had no dark or tarry stools.  CK mildly elevated at 925.  Will provide patient with IV fluids 500 mL and Covid swab pending at this time type and screen obtained.  X-ray reviewed.  Patient has a proximal femur fracture. 2:50 PM  -  Updated family on need for admission.  Plan admit to hospital.  Will consult Ortho to discuss patient case.  2:28 PM discussed with Dr. Alvan Dame who recommends n.p.o. after midnight and 5 pound of [place] traction. Also recommended Robaxin.  I will prescribe 500 mg every 6 hours.  Patient will be admitted to hospitalist service. 3:47 PM discussed with Dr. Lorin Mercy who will admit.   Final Clinical Impression(s) / ED Diagnoses Final diagnoses:  Closed fracture of right hip, initial encounter [facility])  Fall, initial encounter    Rx / DC Orders ED Discharge Orders    None       Tedd Sias, [place] 08/03/20 1547    Valarie Merino, MD 08/05/20 [phone]

## 2020-08-03 NOTE — ED Notes (Signed)
Unable to palpate pulses, EDPA [place] able to doppler pulses

## 2020-08-03 NOTE — Consult Note (Signed)
Reason for Consult: Right hip fracture Referring Physician: Karmen Bongo, MD  [name] [name] is an 70 y.o. male.  HPI: [name] is a 70 y.o. male with medical history significant of CVA; HTN; HLD; and Hep C cirrhosis presenting with a fall on 12/23 while visiting family in [place].  He was on vacation in [place], [place], and visiting his niece.  The last time he was there, he went for a walk with his walker.  He hit an uneven spot and fell.  He landed on his R hip.  Some people helped him up and he was able to walk a little with his walker.  His sister got him back to the room.  He took the train back on 12/28.  They filed a claim against the hotel and were waiting on that to get settled before he came in.  He has not been able to his ADLs.  PCP: Vincente Liberty, MD Consultants:  Brayton El - hepatology; [facility] - GI; Ninfa Linden - orthopedics Patient coming from:  Home - lives with sister; NOK: [name], sister, [phone], [phone]   Past Medical History:  Diagnosis Date  . Arthritis   . BPH (benign prostatic hyperplasia)   . Cataracts, bilateral   . Cirrhosis ([place])    due to hepatitis C  . Colon polyps   . Glaucoma   . Hyperlipidemia   . Hypertension   . Stroke [facility]) 2014    Past Surgical History:  Procedure Laterality Date  . INGUINAL HERNIA REPAIR Right 1988  . KNEE SURGERY Left 1980's   x 4    Family History  Problem Relation Age of Onset  . Stroke Mother   . Lung cancer Mother            . Heart disease Mother   . Liver cancer Father   . Hypertension Sister   . Thyroid disease Sister   . Diabetes Mellitus II Brother   . Stuttering Other   . Cirrhosis Maternal Aunt     Social History:  reports that he quit smoking about 36 years ago. His smoking use included cigarettes. He has never used smokeless tobacco. He reports previous alcohol use. He reports previous drug use. Drugs: Cocaine, Heroin, and Marijuana.  Allergies: No Known  Allergies  Medications:  I have reviewed the patient's current medications. Scheduled: . [START ON 08/04/2020] atorvastatin  10 mg Oral Daily  . docusate sodium  100 mg Oral BID  . [START ON 08/04/2020] lisinopril  10 mg Oral Daily  . metoprolol tartrate  12.5 mg Oral BID    Results for orders placed or performed during the hospital encounter of 08/03/20 (from the past 24 hour(s))  Protime-INR     Status: None   Collection Time: 08/03/20  2:10 PM  Result Value Ref Range   Prothrombin Time 14.1 11.4 - 15.2 seconds   INR 1.1 0.8 - 1.2  CK     Status: Abnormal   Collection Time: 08/03/20  2:10 PM  Result Value Ref Range   Total CK 925 (H) 49 - 397 U/L  Basic metabolic panel     Status: Abnormal   Collection Time: 08/03/20  2:10 PM  Result Value Ref Range   Sodium 136 135 - 145 mmol/L   Potassium 3.7 3.5 - 5.1 mmol/L   Chloride 98 98 - 111 mmol/L   CO2 26 22 - 32 mmol/L   Glucose, Bld 114 (H) 70 - 99 mg/dL   BUN 36 (  H) 8 - 23 mg/dL   Creatinine, Ser 6.21 (H) 0.61 - 1.24 mg/dL   Calcium 8.9 8.9 - 30.8 mg/dL   GFR, Estimated >65 >78 mL/min   Anion gap 12 5 - 15  CBC with Differential/Platelet     Status: Abnormal   Collection Time: 08/03/20  2:10 PM  Result Value Ref Range   WBC 10.2 4.0 - 10.5 K/uL   RBC 2.65 (L) 4.22 - 5.81 MIL/uL   Hemoglobin 8.0 (L) 13.0 - 17.0 g/dL   HCT 46.9 (L) 62.9 - 52.8 %   MCV 97.4 80.0 - 100.0 fL   MCH 30.2 26.0 - 34.0 pg   MCHC 31.0 30.0 - 36.0 g/dL   RDW 41.3 (H) 24.4 - 01.0 %   Platelets 275 150 - 400 K/uL   nRBC 0.0 0.0 - 0.2 %   Neutrophils Relative % 73 %   Neutro Abs 7.5 1.7 - 7.7 K/uL   Lymphocytes Relative 11 %   Lymphs Abs 1.1 0.7 - 4.0 K/uL   Monocytes Relative 9 %   Monocytes Absolute 0.9 0.1 - 1.0 K/uL   Eosinophils Relative 1 %   Eosinophils Absolute 0.1 0.0 - 0.5 K/uL   Basophils Relative 1 %   Basophils Absolute 0.1 0.0 - 0.1 K/uL   Immature Granulocytes 5 %   Abs Immature Granulocytes 0.52 (H) 0.00 - 0.07 K/uL  Resp Panel  by RT-PCR (Flu A&B, Covid) Nasopharyngeal Swab     Status: None   Collection Time: 08/03/20  2:56 PM   Specimen: Nasopharyngeal Swab; Nasopharyngeal(NP) swabs in vial transport medium  Result Value Ref Range   SARS Coronavirus 2 by RT PCR NEGATIVE NEGATIVE   Influenza A by PCR NEGATIVE NEGATIVE   Influenza B by PCR NEGATIVE NEGATIVE  Type and screen  [facility]     Status: None (Preliminary result)   Collection Time: 08/03/20  5:40 PM  Result Value Ref Range   ABO/RH(D) PENDING    Antibody Screen PENDING    Sample Expiration      08/06/2020,2359 Performed at [facility], [address] [place], [place] [address]      X-ray: CLINICAL DATA:  Status post fall with right hip pain.  EXAM: PELVIS - 1-2 VIEW  COMPARISON:  None.  FINDINGS: Comminuted displaced fracture through the intertrochanteric proximal right femur is identified. No other acute fracture or dislocation is noted.  IMPRESSION: Comminuted displaced fracture through the intertrochanteric proximal right femur.   Electronically Signed   By: Sherian Rein M.D.\  CLINICAL DATA:  Larey Seat 07/24/2020. Persistent hip pain. Unable to walk.  EXAM: CT OF THE RIGHT HIP WITHOUT CONTRAST  TECHNIQUE: Multidetector CT imaging of the right hip was performed according to the standard protocol. Multiplanar CT image reconstructions were also generated.  COMPARISON:  Radiographs, same date.  FINDINGS: There is a comminuted and displaced intertrochanteric fracture of the right hip which appears subacute. Early changes of heterotopic ossification are noted and there are bony resorptive changes at the fractures. The lesser trochanter is fractured and displaced.  The femoral head is normally located in the acetabulum. No acetabular fracture. The pubic symphysis and right SI joints are intact. No right-sided pelvic fractures.  Moderate hematoma surrounding the fracture along with  probable muscle injuries.  Extensive vascular calcifications are noted.  IMPRESSION: 1. Subacute appearing comminuted and displaced intertrochanteric fracture of the right hip. Early changes of heterotopic ossification and bony resorptive changes at the fracture sites. 2. Moderate  hematoma surrounding the fracture along with probable muscle injuries. 3. No right-sided pelvic fractures.   Electronically Signed   By: Marijo Sanes M.D.  ROS: As per HPI; otherwise review of systems reviewed and negative.  Blood pressure 128/67, pulse 76, temperature 98 F (36.7 C), temperature source Oral, resp. rate 16, height 5\' 7"  (1.702 m), weight 79.8 kg, SpO2 100 %.  Physical Exam   General:  Appears calm and comfortable and is in NAD  Eyes:  PERRL, EOMI, normal lids, iris  ENT:  grossly normal hearing, lips & tongue, mmm; poor/absent dentition  Neck:  no LAD, masses or thyromegaly  Cardiovascular:  RRR, no m/r/g.    Respiratory:   CTA bilaterally with no wheezes/rales/rhonchi.  Normal respiratory effort.  Abdomen:  soft, NT, ND, NABS  Skin:  Marked ecchymosis of entirety of RLE  Musculoskeletal:  RLE is externally rotated and diffuse edematous; appears to be shortened compared to L   Assessment: 1.  Comminuted right peritrochanteric femur fracture, with significant right lower extremity swelling related to the duration of time since his initial injury 2.  Multiple medical comorbidities  Plan: The patient will be admitted to the hospitalist service per pathway for management of hip fractures.  We will have them work on medical optimization with attempt at surgical repair either on Monday or Tuesday depending on availability and medical stability. He has a history of hepatitis C but his current INR level is 1.1 Regular diet today but n.p.o. after midnight for potential OR intervention on Monday, January 3. Hold DVT chemoprophylaxis until surgical date is definitively  set  Mauri Pole 08/03/2020, 6:33 PM

## 2020-08-04 ENCOUNTER — Encounter (HOSPITAL_COMMUNITY)
Admission: EM | Disposition: A | Discharge status: Skilled Nursing Facility | Payer: Self-pay | Source: Home / Self Care | Attending: Internal Medicine

## 2020-08-04 ENCOUNTER — Inpatient Hospital Stay (HOSPITAL_COMMUNITY): Payer: Medicare HMO | Admitting: Certified Registered Nurse Anesthetist

## 2020-08-04 ENCOUNTER — Inpatient Hospital Stay (HOSPITAL_COMMUNITY): Payer: Medicare HMO

## 2020-08-04 ENCOUNTER — Encounter (HOSPITAL_COMMUNITY): Payer: Self-pay | Admitting: Internal Medicine

## 2020-08-04 DIAGNOSIS — S72001A Fracture of unspecified part of neck of right femur, initial encounter for closed fracture: Secondary | ICD-10-CM | POA: Diagnosis not present

## 2020-08-04 HISTORY — PX: OTHER SURGICAL HISTORY: SHX169

## 2020-08-04 HISTORY — PX: INTRAMEDULLARY (IM) NAIL INTERTROCHANTERIC: SHX5875

## 2020-08-04 LAB — COMPREHENSIVE METABOLIC PANEL
ALT: 43 U/L (ref 0–44)
AST: 46 U/L — ABNORMAL HIGH (ref 15–41)
Albumin: 2.8 g/dL — ABNORMAL LOW (ref 3.5–5.0)
Alkaline Phosphatase: 58 U/L (ref 38–126)
Anion gap: 13 (ref 5–15)
BUN: 23 mg/dL (ref 8–23)
CO2: 27 mmol/L (ref 22–32)
Calcium: 8.8 mg/dL — ABNORMAL LOW (ref 8.9–10.3)
Chloride: 100 mmol/L (ref 98–111)
Creatinine, Ser: 0.99 mg/dL (ref 0.61–1.24)
GFR, Estimated: >60 mL/min (ref >60)
Glucose, Bld: 112 mg/dL — ABNORMAL HIGH (ref 70–99)
Potassium: 3.8 mmol/L (ref 3.5–5.1)
Sodium: 140 mmol/L (ref 135–145)
Total Bilirubin: 2.2 mg/dL — ABNORMAL HIGH (ref 0.3–1.2)
Total Protein: 5.9 g/dL — ABNORMAL LOW (ref 6.5–8.1)

## 2020-08-04 LAB — CBC
HCT: 25.2 % — ABNORMAL LOW (ref 39.0–52.0)
Hemoglobin: 8.3 g/dL — ABNORMAL LOW (ref 13.0–17.0)
MCH: 31.9 pg (ref 26.0–34.0)
MCHC: 32.9 g/dL (ref 30.0–36.0)
MCV: 96.9 fL (ref 80.0–100.0)
Platelets: 270 10*3/uL (ref 150–400)
RBC: 2.6 MIL/uL — ABNORMAL LOW (ref 4.22–5.81)
RDW: 15.8 % — ABNORMAL HIGH (ref 11.5–15.5)
WBC: 8.8 10*3/uL (ref 4.0–10.5)
nRBC: 0 % (ref 0.0–0.2)

## 2020-08-04 LAB — PREPARE RBC (CROSSMATCH)

## 2020-08-04 SURGERY — FIXATION, FRACTURE, INTERTROCHANTERIC, WITH INTRAMEDULLARY ROD
Anesthesia: General | Site: Hip | Laterality: Right

## 2020-08-04 MED ORDER — TRANEXAMIC ACID-NACL 1000-0.7 MG/100ML-% IV SOLN
INTRAVENOUS | Status: AC
  Administered 2020-08-04: 1000 mg via INTRAVENOUS
  Filled 2020-08-04: qty 100

## 2020-08-04 MED ORDER — METHOCARBAMOL 500 MG PO TABS
500.0000 mg | ORAL_TABLET | Freq: Four times a day (QID) | ORAL | Status: DC | PRN
  Administered 2020-08-04 – 2020-08-08 (×10): 500 mg via ORAL
  Filled 2020-08-04 (×10): qty 1

## 2020-08-04 MED ORDER — FENTANYL CITRATE (PF) 100 MCG/2ML IJ SOLN
INTRAMUSCULAR | Status: DC | PRN
  Administered 2020-08-04: 50 ug via INTRAVENOUS
  Administered 2020-08-04: 150 ug via INTRAVENOUS
  Administered 2020-08-04: 50 ug via INTRAVENOUS

## 2020-08-04 MED ORDER — ALUM & MAG HYDROXIDE-SIMETH 200-200-20 MG/5ML PO SUSP: 15.0000 mL | ORAL | Status: DC | PRN

## 2020-08-04 MED ORDER — ROCURONIUM BROMIDE 100 MG/10ML IV SOLN
INTRAVENOUS | Status: DC | PRN
  Administered 2020-08-04: 20 mg via INTRAVENOUS
  Administered 2020-08-04 (×2): 40 mg via INTRAVENOUS

## 2020-08-04 MED ORDER — HYDROCODONE-ACETAMINOPHEN 7.5-325 MG PO TABS
1.0000 | ORAL_TABLET | ORAL | Status: DC | PRN
Start: 2020-08-04 — End: 2020-08-08

## 2020-08-04 MED ORDER — HYDROMORPHONE HCL 1 MG/ML IJ SOLN: 0.2500 mg | INTRAMUSCULAR | Status: DC | PRN

## 2020-08-04 MED ORDER — METHOCARBAMOL 1000 MG/10ML IJ SOLN
500.0000 mg | Freq: Four times a day (QID) | INTRAVENOUS | Status: DC | PRN
  Filled 2020-08-04: qty 5

## 2020-08-04 MED ORDER — METOCLOPRAMIDE HCL 5 MG PO TABS: 5.0000 mg | ORAL_TABLET | Freq: Three times a day (TID) | ORAL | Status: DC | PRN

## 2020-08-04 MED ORDER — POVIDONE-IODINE 10 % EX SWAB: 2.0000 "application " | Freq: Once | CUTANEOUS | Status: DC

## 2020-08-04 MED ORDER — TRANEXAMIC ACID-NACL 1000-0.7 MG/100ML-% IV SOLN
1000.0000 mg | Freq: Once | INTRAVENOUS | Status: AC
  Filled 2020-08-04: qty 100

## 2020-08-04 MED ORDER — DEXAMETHASONE SODIUM PHOSPHATE 10 MG/ML IJ SOLN
10.0000 mg | Freq: Once | INTRAMUSCULAR | Status: AC
  Administered 2020-08-05: 10 mg via INTRAVENOUS
  Filled 2020-08-04: qty 1

## 2020-08-04 MED ORDER — HYDROCODONE-ACETAMINOPHEN 5-325 MG PO TABS
1.0000 | ORAL_TABLET | ORAL | Status: DC | PRN
  Administered 2020-08-04: 1 via ORAL
  Administered 2020-08-05 (×2): 2 via ORAL
  Administered 2020-08-05: 1 via ORAL
  Administered 2020-08-06 – 2020-08-07 (×4): 2 via ORAL
  Administered 2020-08-07: 1 via ORAL
  Administered 2020-08-08: 2 via ORAL
  Administered 2020-08-08 (×2): 1 via ORAL
  Filled 2020-08-04 (×3): qty 2
  Filled 2020-08-04: qty 1
  Filled 2020-08-04 (×2): qty 2
  Filled 2020-08-04 (×2): qty 1
  Filled 2020-08-04: qty 2
  Filled 2020-08-04 (×2): qty 1
  Filled 2020-08-04: qty 2

## 2020-08-04 MED ORDER — DOCUSATE SODIUM 100 MG PO CAPS: 100.0000 mg | ORAL_CAPSULE | Freq: Two times a day (BID) | ORAL | Status: DC

## 2020-08-04 MED ORDER — SODIUM CHLORIDE 0.9 % IV SOLN: INTRAVENOUS | Status: DC

## 2020-08-04 MED ORDER — DIPHENHYDRAMINE HCL 12.5 MG/5ML PO ELIX
12.5000 mg | ORAL_SOLUTION | ORAL | Status: DC | PRN
Start: 2020-08-04 — End: 2020-08-08

## 2020-08-04 MED ORDER — SUCCINYLCHOLINE CHLORIDE 20 MG/ML IJ SOLN
INTRAMUSCULAR | Status: DC | PRN
  Administered 2020-08-04: 120 mg via INTRAVENOUS

## 2020-08-04 MED ORDER — SUGAMMADEX SODIUM 200 MG/2ML IV SOLN
INTRAVENOUS | Status: DC | PRN
  Administered 2020-08-04: 200 mg via INTRAVENOUS

## 2020-08-04 MED ORDER — CHLORHEXIDINE GLUCONATE 0.12 % MT SOLN
OROMUCOSAL | Status: AC
  Administered 2020-08-04: 15 mL
  Filled 2020-08-04: qty 15

## 2020-08-04 MED ORDER — PROPOFOL 10 MG/ML IV BOLUS
INTRAVENOUS | Status: DC | PRN
  Administered 2020-08-04: 150 mg via INTRAVENOUS

## 2020-08-04 MED ORDER — MAGNESIUM CITRATE PO SOLN: 1.0000 | Freq: Once | ORAL | Status: DC | PRN

## 2020-08-04 MED ORDER — PHENYLEPHRINE HCL (PRESSORS) 10 MG/ML IV SOLN
INTRAVENOUS | Status: DC | PRN
  Administered 2020-08-04: 100 ug via INTRAVENOUS
  Administered 2020-08-04 (×2): 120 ug via INTRAVENOUS
  Administered 2020-08-04: 80 ug via INTRAVENOUS
  Administered 2020-08-04: 120 ug via INTRAVENOUS

## 2020-08-04 MED ORDER — BISACODYL 10 MG RE SUPP: 10.0000 mg | Freq: Every day | RECTAL | Status: DC | PRN

## 2020-08-04 MED ORDER — CEFAZOLIN SODIUM-DEXTROSE 2-4 GM/100ML-% IV SOLN
INTRAVENOUS | Status: AC
  Administered 2020-08-05: 2 g via INTRAVENOUS
  Filled 2020-08-04: qty 100

## 2020-08-04 MED ORDER — FERROUS SULFATE 325 (65 FE) MG PO TABS
325.0000 mg | ORAL_TABLET | Freq: Three times a day (TID) | ORAL | Status: DC
  Administered 2020-08-05 – 2020-08-08 (×11): 325 mg via ORAL
  Filled 2020-08-04 (×11): qty 1

## 2020-08-04 MED ORDER — ACETAMINOPHEN 325 MG PO TABS: 325.0000 mg | ORAL_TABLET | Freq: Four times a day (QID) | ORAL | Status: DC | PRN

## 2020-08-04 MED ORDER — MEPERIDINE HCL 25 MG/ML IJ SOLN: 6.2500 mg | INTRAMUSCULAR | Status: DC | PRN

## 2020-08-04 MED ORDER — OXYCODONE HCL 5 MG PO TABS
5.0000 mg | ORAL_TABLET | Freq: Once | ORAL | Status: DC | PRN
Start: 2020-08-04 — End: 2020-08-04

## 2020-08-04 MED ORDER — MENTHOL 3 MG MT LOZG: 1.0000 | LOZENGE | OROMUCOSAL | Status: DC | PRN

## 2020-08-04 MED ORDER — ASPIRIN 81 MG PO CHEW
81.0000 mg | CHEWABLE_TABLET | Freq: Two times a day (BID) | ORAL | Status: DC
  Administered 2020-08-04 – 2020-08-08 (×8): 81 mg via ORAL
  Filled 2020-08-04 (×8): qty 1

## 2020-08-04 MED ORDER — ONDANSETRON HCL 4 MG PO TABS: 4.0000 mg | ORAL_TABLET | Freq: Four times a day (QID) | ORAL | Status: DC | PRN

## 2020-08-04 MED ORDER — ONDANSETRON HCL 4 MG/2ML IJ SOLN
INTRAMUSCULAR | Status: DC | PRN
  Administered 2020-08-04: 4 mg via INTRAVENOUS

## 2020-08-04 MED ORDER — CEFAZOLIN SODIUM-DEXTROSE 2-4 GM/100ML-% IV SOLN
2.0000 g | Freq: Four times a day (QID) | INTRAVENOUS | Status: AC
  Administered 2020-08-04: 2 g via INTRAVENOUS
  Filled 2020-08-04 (×2): qty 100

## 2020-08-04 MED ORDER — POLYETHYLENE GLYCOL 3350 17 G PO PACK
17.0000 g | PACK | Freq: Two times a day (BID) | ORAL | Status: DC
  Administered 2020-08-04 – 2020-08-07 (×7): 17 g via ORAL
  Filled 2020-08-04 (×8): qty 1

## 2020-08-04 MED ORDER — TRANEXAMIC ACID-NACL 1000-0.7 MG/100ML-% IV SOLN
INTRAVENOUS | Status: DC | PRN
  Administered 2020-08-04: 1000 mg via INTRAVENOUS

## 2020-08-04 MED ORDER — LACTATED RINGERS IV SOLN: INTRAVENOUS | Status: DC | PRN

## 2020-08-04 MED ORDER — MIDAZOLAM HCL 2 MG/2ML IJ SOLN: 0.5000 mg | Freq: Once | INTRAMUSCULAR | Status: DC | PRN

## 2020-08-04 MED ORDER — CEFAZOLIN SODIUM-DEXTROSE 2-3 GM-%(50ML) IV SOLR
INTRAVENOUS | Status: DC | PRN
  Administered 2020-08-04: 2 g via INTRAVENOUS

## 2020-08-04 MED ORDER — ONDANSETRON HCL 4 MG/2ML IJ SOLN: 4.0000 mg | Freq: Four times a day (QID) | INTRAMUSCULAR | Status: DC | PRN

## 2020-08-04 MED ORDER — CEFAZOLIN SODIUM-DEXTROSE 2-4 GM/100ML-% IV SOLN: 2.0000 g | INTRAVENOUS | Status: DC

## 2020-08-04 MED ORDER — METOCLOPRAMIDE HCL 5 MG/ML IJ SOLN: 5.0000 mg | Freq: Three times a day (TID) | INTRAMUSCULAR | Status: DC | PRN

## 2020-08-04 MED ORDER — PHENYLEPHRINE HCL-NACL 10-0.9 MG/250ML-% IV SOLN
INTRAVENOUS | Status: DC | PRN
  Administered 2020-08-04: 50 ug/min via INTRAVENOUS

## 2020-08-04 MED ORDER — CHLORHEXIDINE GLUCONATE 4 % EX LIQD: 60.0000 mL | Freq: Once | CUTANEOUS | Status: DC

## 2020-08-04 MED ORDER — DEXAMETHASONE SODIUM PHOSPHATE 4 MG/ML IJ SOLN
INTRAMUSCULAR | Status: DC | PRN
  Administered 2020-08-04: 5 mg via INTRAVENOUS

## 2020-08-04 MED ORDER — PROMETHAZINE HCL 25 MG/ML IJ SOLN: 6.2500 mg | INTRAMUSCULAR | Status: DC | PRN

## 2020-08-04 MED ORDER — MORPHINE SULFATE (PF) 2 MG/ML IV SOLN: 0.5000 mg | INTRAVENOUS | Status: DC | PRN

## 2020-08-04 MED ORDER — TRANEXAMIC ACID-NACL 1000-0.7 MG/100ML-% IV SOLN: 1000.0000 mg | INTRAVENOUS | Status: DC

## 2020-08-04 MED ORDER — PROPOFOL 10 MG/ML IV BOLUS
INTRAVENOUS | Status: AC
  Filled 2020-08-04: qty 20

## 2020-08-04 MED ORDER — PHENOL 1.4 % MT LIQD: 1.0000 | OROMUCOSAL | Status: DC | PRN

## 2020-08-04 MED ORDER — OXYCODONE HCL 5 MG/5ML PO SOLN: 5.0000 mg | Freq: Once | ORAL | Status: DC | PRN

## 2020-08-04 MED ORDER — LIDOCAINE HCL (CARDIAC) PF 100 MG/5ML IV SOSY
PREFILLED_SYRINGE | INTRAVENOUS | Status: DC | PRN
  Administered 2020-08-04: 50 mg via INTRAVENOUS

## 2020-08-04 MED ORDER — SODIUM CHLORIDE 0.9% IV SOLUTION
Freq: Once | INTRAVENOUS | Status: AC
  Administered 2020-08-04: 100 mL via INTRAVENOUS

## 2020-08-04 MED ORDER — FENTANYL CITRATE (PF) 250 MCG/5ML IJ SOLN
INTRAMUSCULAR | Status: AC
  Filled 2020-08-04: qty 5

## 2020-08-04 SURGICAL SUPPLY — 37 items
BIT DRILL CANN LG 4.3MM (BIT) IMPLANT
BNDG GAUZE ELAST 4 BULKY (GAUZE/BANDAGES/DRESSINGS) ×1 IMPLANT
COVER PERINEAL POST (MISCELLANEOUS) ×2 IMPLANT
COVER SURGICAL LIGHT HANDLE (MISCELLANEOUS) ×2 IMPLANT
COVER WAND RF STERILE (DRAPES) ×2 IMPLANT
DERMABOND ADVANCED (GAUZE/BANDAGES/DRESSINGS) ×1
DERMABOND ADVANCED .7 DNX12 (GAUZE/BANDAGES/DRESSINGS) ×1 IMPLANT
DRAPE STERI IOBAN 125X83 (DRAPES) ×2 IMPLANT
DRILL BIT CANN LG 4.3MM (BIT) ×2
DRSG AQUACEL AG ADV 3.5X 6 (GAUZE/BANDAGES/DRESSINGS) ×2 IMPLANT
DURAPREP 26ML APPLICATOR (WOUND CARE) ×2 IMPLANT
ELECT REM PT RETURN 9FT ADLT (ELECTROSURGICAL) ×2
ELECTRODE REM PT RTRN 9FT ADLT (ELECTROSURGICAL) ×1 IMPLANT
FACESHIELD WRAPAROUND (MASK) ×4 IMPLANT
FACESHIELD WRAPAROUND OR TEAM (MASK) ×2 IMPLANT
GLOVE BIO SURGEON STRL SZ7.5 (GLOVE) ×2 IMPLANT
GLOVE INDICATOR 7.5 STRL GRN (GLOVE) ×2 IMPLANT
GOWN STRL REUS W/ TWL LRG LVL3 (GOWN DISPOSABLE) ×2 IMPLANT
GOWN STRL REUS W/TWL 2XL LVL3 (GOWN DISPOSABLE) ×1 IMPLANT
GOWN STRL REUS W/TWL LRG LVL3 (GOWN DISPOSABLE) ×3
GUIDEPIN 3.2X17.5 THRD DISP (PIN) ×1 IMPLANT
HIP FR NAIL LAG SCREW 10.5X110 (Orthopedic Implant) ×2 IMPLANT
KIT TURNOVER KIT B (KITS) ×2 IMPLANT
NAIL HIP FRACT 130D 11X180 (Screw) ×1 IMPLANT
NS IRRIG 1000ML POUR BTL (IV SOLUTION) ×2 IMPLANT
PACK GENERAL/GYN (CUSTOM PROCEDURE TRAY) ×2 IMPLANT
PAD ARMBOARD 7.5X6 YLW CONV (MISCELLANEOUS) ×4 IMPLANT
SCREW BONE CORTICAL 5.0X38 (Screw) ×1 IMPLANT
SCREW LAG HIP FR NAIL 10.5X110 (Orthopedic Implant) IMPLANT
SUT MNCRL AB 4-0 PS2 18 (SUTURE) ×2 IMPLANT
SUT VIC AB 1 CT1 27 (SUTURE) ×2
SUT VIC AB 1 CT1 27XBRD ANBCTR (SUTURE) ×1 IMPLANT
SUT VIC AB 2-0 CT1 27 (SUTURE) ×2
SUT VIC AB 2-0 CT1 TAPERPNT 27 (SUTURE) ×1 IMPLANT
TOWEL GREEN STERILE (TOWEL DISPOSABLE) ×2 IMPLANT
TOWEL GREEN STERILE FF (TOWEL DISPOSABLE) ×2 IMPLANT
WATER STERILE IRR 1000ML POUR (IV SOLUTION) ×2 IMPLANT

## 2020-08-04 NOTE — H&P (View-Only) (Signed)
Patient ID: [name], male   DOB: 1951-06-02, 70 y.o.   MRN: [id number]  Seen in ER where he has been overnight.  Relatively comfortable given circumstances.  RLE Short and externally rotated RLE edema  Assessment: Right comminuted intertrochanteric hip fracture now about 66-91 days old  Plan: To OR today for ORIF NPO Reviewed indications Risks of non union, mal union, and infection reviewed as well as post op course and expectations

## 2020-08-04 NOTE — ED Notes (Signed)
Lunch Tray Ordered @ 1045 

## 2020-08-04 NOTE — Anesthesia Preprocedure Evaluation (Addendum)
Anesthesia Evaluation  Patient identified by MRN, date of birth, ID band Patient awake    Reviewed: Allergy & Precautions, NPO status , Patient's Chart, lab work & pertinent test results  Airway Mallampati: II  TM Distance: >3 FB     Dental   Pulmonary former smoker,  08/03/2020 SARS coronavirus NEG   breath sounds clear to auscultation       Cardiovascular hypertension, Pt. on medications and Pt. on home beta blockers  Rhythm:Regular Rate:Normal  ECG: SR, rate 79 '16 ECHO: moderate LVH, EF 60- 65%. Wall motion was normal; grade 1 diastolic dysfunction,  mild systolic anterior motion of the mitral chordal structures, with trivial MR.     Neuro/Psych glaucoma CVA, Residual Symptoms    GI/Hepatic negative GI ROS, (+) Cirrhosis       , Hepatitis -  Endo/Other  negative endocrine ROS  Renal/GU negative Renal ROS     Musculoskeletal  (+) Arthritis ,   Abdominal   Peds  Hematology  (+) Blood dyscrasia (Hb 8.3), anemia , HLD   Anesthesia Other Findings RIGHT HIP FX  Reproductive/Obstetrics                           Anesthesia Physical Anesthesia Plan  ASA: III  Anesthesia Plan: General   Post-op Pain Management:    Induction: Intravenous  PONV Risk Score and Plan: 2 and Ondansetron, Dexamethasone and Treatment may vary due to age or medical condition  Airway Management Planned: Oral ETT  Additional Equipment:   Intra-op Plan:   Post-operative Plan: Extubation in OR  Informed Consent: I have reviewed the patients History and Physical, chart, labs and discussed the procedure including the risks, benefits and alternatives for the proposed anesthesia with the patient or authorized representative who has indicated his/her understanding and acceptance.     Dental advisory given  Plan Discussed with: CRNA and Anesthesiologist  Anesthesia Plan Comments:        Anesthesia Quick  Evaluation

## 2020-08-04 NOTE — Transfer of Care (Signed)
Immediate Anesthesia Transfer of Care Note  Patient: Anquan Henighan Jr  Procedure(s) Performed: INTRAMEDULLARY (IM) NAIL INTERTROCHANTRIC (Right Hip)  Patient Location: PACU  Anesthesia Type:General  Level of Consciousness: awake, alert , oriented and patient cooperative  Airway & Oxygen Therapy: Patient Spontanous Breathing and Patient connected to nasal cannula oxygen  Post-op Assessment: Report given to RN and Post -op Vital signs reviewed and stable  Post vital signs: Reviewed and stable  Last Vitals:  Vitals Value Taken Time  BP 119/83 08/04/20 1805  Temp    Pulse 109 08/04/20 1807  Resp 21 08/04/20 1807  SpO2 99 % 08/04/20 1807  Vitals shown include unvalidated device data.  Last Pain:  Vitals:   08/04/20 0811  TempSrc: Oral  PainSc:          Complications: No complications documented.

## 2020-08-04 NOTE — Interval H&P Note (Signed)
History and Physical Interval Note:  08/04/2020 4:18 PM  [name]  has presented today for surgery, with the diagnosis of RIGHT HIP FX.  The various methods of treatment have been discussed with the patient and family. After consideration of risks, benefits and other options for treatment, the patient has consented to  Procedure(s): INTRAMEDULLARY (IM) NAIL INTERTROCHANTRIC (Right) as a surgical intervention.  The patient's history has been reviewed, patient examined, no change in status, stable for surgery.  I have reviewed the patient's chart and labs.  Questions were answered to the patient's satisfaction.     [name]

## 2020-08-04 NOTE — Brief Op Note (Signed)
08/03/2020 - 08/04/2020  4:21 PM  PATIENT:  Gwynn Burly  70 y.o. male  PRE-OPERATIVE DIAGNOSIS:  RIGHT intertrochanteric hip fracture  POST-OPERATIVE DIAGNOSIS:  RIGHT intertrochanteric hip fracture  PROCEDURE:  Procedure(s): INTRAMEDULLARY (IM) NAIL INTERTROCHANTRIC (Right)  SURGEON:  Surgeon(s) and Role:    Durene Romans, MD - Primary  PHYSICIAN ASSISTANT: Dennie Bible, PA-C  ANESTHESIA:   general  EBL:  150cc   BLOOD ADMINISTERED:none  DRAINS: none   LOCAL MEDICATIONS USED:  NONE  SPECIMEN:  No Specimen  DISPOSITION OF SPECIMEN:  N/A  COUNTS:  YES  TOURNIQUET:  * No tourniquets in log *  DICTATION: .Other Dictation: Dictation Number [id number]  PLAN OF CARE: Admit to inpatient   PATIENT DISPOSITION:  PACU - hemodynamically stable.   Delay start of Pharmacological VTE agent (>24hrs) due to surgical blood loss or risk of bleeding: no

## 2020-08-04 NOTE — Anesthesia Procedure Notes (Signed)
Procedure Name: Intubation Date/Time: 08/04/2020 4:34 PM Performed by: Tillman Abide, CRNA Pre-anesthesia Checklist: Patient identified, Emergency Drugs available, Suction available and Patient being monitored Patient Re-evaluated:Patient Re-evaluated prior to induction Oxygen Delivery Method: Circle System Utilized Preoxygenation: Pre-oxygenation with 100% oxygen Induction Type: IV induction Ventilation: Mask ventilation without difficulty Laryngoscope Size: Miller and 3 Grade View: Grade I Tube type: Oral Tube size: 7.5 mm Number of attempts: 1 Airway Equipment and Method: Stylet Placement Confirmation: ETT inserted through vocal cords under direct vision,  positive ETCO2 and breath sounds checked- equal and bilateral Secured at: 23 cm Tube secured with: Tape Dental Injury: Teeth and Oropharynx as per pre-operative assessment

## 2020-08-04 NOTE — Progress Notes (Signed)
[facility] PROGRESS NOTE    [name]  [id number] DOB: [birth year] DOA: 08/03/2020 PCP: Vincente Liberty, MD      Brief Narrative:  [name] is a 70 y.o. M with CVA, HTN, hep C cirrhosis who presented after a fall while visiting family in [place].  He was walking and started on [place] with his niece and had an uneven spot and fell landing on his right hip.  He was able to walk briefly with his walker.  He was able to return home, where he came to the ER.  Here x-ray showed hip fracture, present for 10 days.  Orthopedics were consulted who recommended Buck's traction, Robaxin, and to the OR .       Assessment & Plan:  Hip fracture Hip fracture more than 10 days ago, delayed seeking medical care.  Imaging with comminuted displaced intertrochanteric right fracture with surrounding muscular injury and hematoma. -Consult orthopedics, appreciate recommendations  Hep C cirrhosis -Hold Epclusa  Hypertension -Continue amlodipine, atorvastatin, metoprolol -Hold chlorthalidone, lisinopril  BPH -Continue Flomax  History of cerebrovascular disease -Continue atorvastatin  Anemia -Close monitoring postoperatively     Disposition: Status is: Inpatient  Remains inpatient appropriate because:Inpatient level of care appropriate due to severity of illness   Dispo: The patient is from: Home              Anticipated d/c is to: SNF              Anticipated d/c date is: 3 days              Patient currently is not medically stable to d/c.              MDM: The below labs and imaging reports were reviewed and summarized above.  Medication management as above.    DVT prophylaxis: SCDs Start: 08/03/20 1820  Code Status: FULL Family Communication:         Subjective: Patient has had pain in his leg, but no fever, confusion, vomiting, dyspnea, orthopnea, leg swelling.  Objective: Vitals:   08/04/20 1835 08/04/20 1850  08/04/20 1905 08/04/20 1920  BP: 122/79 128/78 119/64 125/61  Pulse: (!) 115 (!) 116 (!) 103 100  Resp: 18 15 16 17   Temp:      TempSrc:      SpO2: 100% 99% 97% 98%  Weight:      Height:        Intake/Output Summary (Last 24 hours) at 08/04/2020 1934 Last data filed at 08/04/2020 1900 Gross per 24 hour  Intake 1430 ml  Output 300 ml  Net 1130 ml   Filed Weights   08/03/20 1413  Weight: 79.8 kg    Examination: General appearance:  adult male, sleepy and in moderate distress from pain.   HEENT: Anicteric, conjunctiva pink, lids and lashes normal. No nasal deformity, discharge, epistaxis.  Lips moist.   Skin: Warm and dry.  no jaundice.  No suspicious rashes or lesions. Cardiac: RRR, nl S1-S2, no murmurs appreciated.  Capillary refill is brisk.  JVP not visible.  No LE edema.  Radial pulses 2+ and symmetric. Respiratory: Normal respiratory rate and rhythm.  CTAB without rales or wheezes. Abdomen: Abdomen soft.  No TTP or guarding. No ascites, distension, hepatosplenomegaly.   MSK: Obvious deformity of the hip, in traction Neuro: Awake and alert.  EOMI, moves all extremities. Speech fluent.    Psych: Sensorium intact and responding to questions, attention diminsiehd. Affect normal.  Judgment and  insight appear impaired.    Data Reviewed: I have personally reviewed following labs and imaging studies:  CBC: Recent Labs  Lab 08/03/20 1410 08/04/20 0553  WBC 10.2 8.8  NEUTROABS 7.5  --   HGB 8.0* 8.3*  HCT 25.8* 25.2*  MCV 97.4 96.9  PLT 275 [id number]   Basic Metabolic Panel: Recent Labs  Lab 08/03/20 1410 08/04/20 0553  NA 136 140  K 3.7 3.8  CL 98 100  CO2 26 27  GLUCOSE 114* 112*  BUN 36* 23  CREATININE 1.25* 0.99  CALCIUM 8.9 8.8*   GFR: Estimated Creatinine Clearance: 69.3 mL/min (by C-G formula based on SCr of 0.99 mg/dL). Liver Function Tests: Recent Labs  Lab 08/04/20 0553  AST 46*  ALT 43  ALKPHOS 58  BILITOT 2.2*  PROT 5.9*  ALBUMIN 2.8*   No  results for input(s): LIPASE, AMYLASE in the last 168 hours. No results for input(s): AMMONIA in the last 168 hours. Coagulation Profile: Recent Labs  Lab 08/03/20 1410  INR 1.1   Cardiac Enzymes: Recent Labs  Lab 08/03/20 1410  CKTOTAL 925*   BNP (last 3 results) No results for input(s): PROBNP in the last 8760 hours. HbA1C: No results for input(s): HGBA1C in the last 72 hours. CBG: No results for input(s): GLUCAP in the last 168 hours. Lipid Profile: No results for input(s): CHOL, HDL, LDLCALC, TRIG, CHOLHDL, LDLDIRECT in the last 72 hours. Thyroid Function Tests: No results for input(s): TSH, T4TOTAL, FREET4, T3FREE, THYROIDAB in the last 72 hours. Anemia Panel: No results for input(s): VITAMINB12, FOLATE, FERRITIN, TIBC, IRON, RETICCTPCT in the last 72 hours. Urine analysis:    Component Value Date/Time   COLORURINE YELLOW 09/21/2014 [place] 09/21/2014 1641   LABSPEC 1.019 09/21/2014 1641   PHURINE 5.5 09/21/2014 1641   GLUCOSEU NEGATIVE 09/21/2014 1641   HGBUR NEGATIVE 09/21/2014 1641   BILIRUBINUR NEGATIVE 09/21/2014 1641   KETONESUR 15 (A) 09/21/2014 1641   PROTEINUR NEGATIVE 09/21/2014 1641   UROBILINOGEN 0.2 09/21/2014 1641   NITRITE NEGATIVE 09/21/2014 1641   LEUKOCYTESUR TRACE (A) 09/21/2014 1641   Sepsis Labs: @LABRCNTIP (procalcitonin:4,lacticacidven:4)  ) Recent Results (from the past 240 hour(s))  Resp Panel by RT-PCR (Flu A&B, Covid) Nasopharyngeal Swab     Status: None   Collection Time: 08/03/20  2:56 PM   Specimen: Nasopharyngeal Swab; Nasopharyngeal(NP) swabs in vial transport medium  Result Value Ref Range Status   SARS Coronavirus 2 by RT PCR NEGATIVE NEGATIVE Final    Comment: (NOTE) SARS-CoV-2 target nucleic acids are NOT DETECTED.  The SARS-CoV-2 RNA is generally detectable in upper respiratory specimens during the acute phase of infection. The lowest concentration of SARS-CoV-2 viral copies this assay can detect  is 138 copies/mL. A negative result does not preclude SARS-Cov-2 infection and should not be used as the sole basis for treatment or other patient management decisions. A negative result may occur with  improper specimen collection/handling, submission of specimen other than nasopharyngeal swab, presence of viral mutation(s) within the areas targeted by this assay, and inadequate number of viral copies(<138 copies/mL). A negative result must be combined with clinical observations, patient history, and epidemiological information. The expected result is Negative.  Fact Sheet for Patients:  EntrepreneurPulse.com.au  Fact Sheet for Healthcare Providers:  IncredibleEmployment.be  This test is no t yet approved or cleared by the [place] FDA and  has been authorized for detection and/or diagnosis of SARS-CoV-2 by FDA under an Emergency Use Authorization (EUA). This EUA  will remain  in effect (meaning this test can be used) for the duration of the COVID-19 declaration under Section 564(b)(1) of the Act, 21 U.S.C.section 360bbb-3(b)(1), unless the authorization is terminated  or revoked sooner.       Influenza A by PCR NEGATIVE NEGATIVE Final   Influenza B by PCR NEGATIVE NEGATIVE Final    Comment: (NOTE) The Xpert Xpress SARS-CoV-2/FLU/RSV plus assay is intended as an aid in the diagnosis of influenza from Nasopharyngeal swab specimens and should not be used as a sole basis for treatment. Nasal washings and aspirates are unacceptable for Xpert Xpress SARS-CoV-2/FLU/RSV testing.  Fact Sheet for Patients: EntrepreneurPulse.com.au  Fact Sheet for Healthcare Providers: IncredibleEmployment.be  This test is not yet approved or cleared by the [place] FDA and has been authorized for detection and/or diagnosis of SARS-CoV-2 by FDA under an Emergency Use Authorization (EUA). This EUA will remain in effect  (meaning this test can be used) for the duration of the COVID-19 declaration under Section 564(b)(1) of the Act, 21 U.S.C. section 360bbb-3(b)(1), unless the authorization is terminated or revoked.  Performed at [facility], [place] [address], [place], [place] [address]          Radiology Studies: DG Pelvis 1-2 Views  Result Date: 08/03/2020 CLINICAL DATA:  Status post fall with right hip pain. EXAM: PELVIS - 1-2 VIEW COMPARISON:  None. FINDINGS: Comminuted displaced fracture through the intertrochanteric proximal right femur is identified. No other acute fracture or dislocation is noted. IMPRESSION: Comminuted displaced fracture through the intertrochanteric proximal right femur. Electronically Signed   By: Abelardo Diesel M.D.   On: 08/03/2020 14:44   DG Pelvis Portable  Result Date: 08/04/2020 CLINICAL DATA:  70 year old male status post ORIF of the right femur. EXAM: PORTABLE PELVIS 1-2 VIEWS COMPARISON:  Pelvic radiograph dated 08/03/2020. FINDINGS: Status post ORIF the right femoral neck fracture. The hardware appears intact. No dislocation. Postsurgical changes and soft tissue air in the right hip. IMPRESSION: Status post ORIF of the right femoral neck fracture. No dislocation. Electronically Signed   By: Anner Crete M.D.   On: 08/04/2020 18:39   CT Hip Right Wo Contrast  Result Date: 08/03/2020 CLINICAL DATA:  [place] 07/24/2020. Persistent hip pain. Unable to walk. EXAM: CT OF THE RIGHT HIP WITHOUT CONTRAST TECHNIQUE: Multidetector CT imaging of the right hip was performed according to the standard protocol. Multiplanar CT image reconstructions were also generated. COMPARISON:  Radiographs, same date. FINDINGS: There is a comminuted and displaced intertrochanteric fracture of the right hip which appears subacute. Early changes of heterotopic ossification are noted and there are bony resorptive changes at the fractures. The lesser trochanter is fractured and displaced. The femoral  head is normally located in the acetabulum. No acetabular fracture. The pubic symphysis and right SI joints are intact. No right-sided pelvic fractures. Moderate hematoma surrounding the fracture along with probable muscle injuries. Extensive vascular calcifications are noted. IMPRESSION: 1. Subacute appearing comminuted and displaced intertrochanteric fracture of the right hip. Early changes of heterotopic ossification and bony resorptive changes at the fracture sites. 2. Moderate hematoma surrounding the fracture along with probable muscle injuries. 3. No right-sided pelvic fractures. Electronically Signed   By: Marijo Sanes M.D.   On: 08/03/2020 16:43   DG CHEST PORT 1 VIEW  Result Date: 08/03/2020 CLINICAL DATA:  70 year old male with history of right femur fracture. Preoperative chest x-ray. EXAM: PORTABLE CHEST 1 VIEW COMPARISON:  Chest x-ray 04/09/2020. FINDINGS: Lung volumes are normal. Mild elevation  of the left hemidiaphragm (chronic). No consolidative airspace disease. No pleural effusions. No pneumothorax. Several tiny calcified granulomas are noted in the right lung. No other suspicious appearing pulmonary nodule or mass noted. Pulmonary vasculature and the cardiomediastinal silhouette are within normal limits. Atherosclerosis in the thoracic aorta. IMPRESSION: 1. No radiographic evidence of acute cardiopulmonary disease. 2. Aortic atherosclerosis. Electronically Signed   By: Trudie Reed M.D.   On: 08/03/2020 19:05   DG C-Arm 1-60 Min  Result Date: 08/04/2020 CLINICAL DATA:  70 year old male status post ORIF of the right femoral neck fracture. EXAM: DG C-ARM 1-60 MIN; RIGHT FEMUR 2 VIEWS COMPARISON:  Right hip radiograph dated 08/03/2020. FLUOROSCOPY TIME:  Two intraoperative fluoroscopic spot images provided. The total fluoroscopic time is 58 seconds. Status post ORIF of the femoral neck fracture. FINDINGS: ORIF of the right femoral neck fracture. Electronically Signed   By: Elgie Collard M.D.   On: 08/04/2020 17:44   DG FEMUR, MIN 2 VIEWS RIGHT  Result Date: 08/04/2020 CLINICAL DATA:  70 year old male status post ORIF of the right femoral neck fracture. EXAM: DG C-ARM 1-60 MIN; RIGHT FEMUR 2 VIEWS COMPARISON:  Right hip radiograph dated 08/03/2020. FLUOROSCOPY TIME:  Two intraoperative fluoroscopic spot images provided. The total fluoroscopic time is 58 seconds. Status post ORIF of the femoral neck fracture. FINDINGS: ORIF of the right femoral neck fracture. Electronically Signed   By: Elgie Collard M.D.   On: 08/04/2020 17:44   DG Femur Min 2 Views Right  Result Date: 08/03/2020 CLINICAL DATA:  Status post fall with right hip pain. EXAM: RIGHT FEMUR 2 VIEWS COMPARISON:  None. FINDINGS: Comminuted displaced intertrochanteric fracture through the right proximal femur is noted. IMPRESSION: Comminuted displaced intertrochanteric fracture through the right proximal femur. Electronically Signed   By: Sherian Rein M.D.   On: 08/03/2020 14:43        Scheduled Meds: . [MAR Hold] amLODipine  10 mg Oral q AM  . [MAR Hold] atorvastatin  10 mg Oral Daily  . [MAR Hold] cycloSPORINE  1 drop Both Eyes BID  . [MAR Hold] docusate sodium  100 mg Oral BID  . [MAR Hold] lisinopril  10 mg Oral Daily  . [MAR Hold] metoprolol tartrate  12.5 mg Oral BID  . [MAR Hold] tamsulosin  0.4 mg Oral QHS   Continuous Infusions: . sodium chloride    . ceFAZolin    . [MAR Hold] methocarbamol (ROBAXIN) IV       LOS: 1 day    Time spent: 25 minutes    Alberteen Sam, MD [facility] 08/04/2020, 7:34 PM     Please page though AMION or Epic secure chat:  For [employer], Higher education careers adviser

## 2020-08-04 NOTE — Anesthesia Postprocedure Evaluation (Signed)
Anesthesia Post Note  Patient: [name]  Procedure(s) Performed: INTRAMEDULLARY (IM) NAIL INTERTROCHANTRIC (Right Hip)     Patient location during evaluation: PACU Anesthesia Type: General Level of consciousness: awake Pain management: pain level controlled Vital Signs Assessment: post-procedure vital signs reviewed and stable Respiratory status: spontaneous breathing, nonlabored ventilation, respiratory function stable and patient connected to nasal cannula oxygen Cardiovascular status: blood pressure returned to baseline and stable Postop Assessment: no apparent nausea or vomiting Anesthetic complications: no   No complications documented.  Last Vitals:  Vitals:   08/04/20 1950 08/04/20 2028  BP: 115/66 130/69  Pulse: (!) 107 (!) 105  Resp: 18 18  Temp:  37.3 C  SpO2: 98% 100%    Last Pain:  Vitals:   08/04/20 1950  TempSrc:   PainSc: 0-No pain                 Lanorris Kalisz P Christain Mcraney

## 2020-08-04 NOTE — Progress Notes (Signed)
Patient ID: [name], male   DOB: 05/14/1951, 69 y.o.   MRN: [id number]  Seen in ER where he has been overnight.  Relatively comfortable given circumstances.  RLE Short and externally rotated RLE edema  Assessment: Right comminuted intertrochanteric hip fracture now about 10-11 days old  Plan: To OR today for ORIF NPO Reviewed indications Risks of non union, mal union, and infection reviewed as well as post op course and expectations  

## 2020-08-05 ENCOUNTER — Encounter (HOSPITAL_COMMUNITY): Payer: Self-pay | Admitting: Internal Medicine

## 2020-08-05 DIAGNOSIS — S72001A Fracture of unspecified part of neck of right femur, initial encounter for closed fracture: Secondary | ICD-10-CM | POA: Diagnosis not present

## 2020-08-05 LAB — BPAM RBC
Blood Product Expiration Date: 202202012359
Blood Product Expiration Date: 202202012359
ISSUE DATE / TIME: 202201031625
ISSUE DATE / TIME: 202201031735
Unit Type and Rh: 5100
Unit Type and Rh: 5100

## 2020-08-05 LAB — CBC
HCT: 27.5 % — ABNORMAL LOW (ref 39.0–52.0)
Hemoglobin: 9.3 g/dL — ABNORMAL LOW (ref 13.0–17.0)
MCH: 31 pg (ref 26.0–34.0)
MCHC: 33.8 g/dL (ref 30.0–36.0)
MCV: 91.7 fL (ref 80.0–100.0)
Platelets: 243 10*3/uL (ref 150–400)
RBC: 3 MIL/uL — ABNORMAL LOW (ref 4.22–5.81)
RDW: 16.9 % — ABNORMAL HIGH (ref 11.5–15.5)
WBC: 15.9 10*3/uL — ABNORMAL HIGH (ref 4.0–10.5)
nRBC: 0 % (ref 0.0–0.2)

## 2020-08-05 LAB — TYPE AND SCREEN
ABO/RH(D): O POS
Antibody Screen: NEGATIVE
Unit division: 0
Unit division: 0

## 2020-08-05 LAB — IRON AND TIBC
Iron: 38 ug/dL — ABNORMAL LOW (ref 45–182)
Saturation Ratios: 14 % — ABNORMAL LOW (ref 17.9–39.5)
TIBC: 273 ug/dL (ref 250–450)
UIBC: 235 ug/dL

## 2020-08-05 LAB — VITAMIN B12: Vitamin B-12: 2482 pg/mL — ABNORMAL HIGH (ref 180–914)

## 2020-08-05 LAB — RETICULOCYTES
Immature Retic Fract: 35.1 % — ABNORMAL HIGH (ref 2.3–15.9)
RBC.: 3.07 MIL/uL — ABNORMAL LOW (ref 4.22–5.81)
Retic Count, Absolute: 159.6 10*3/uL (ref 19.0–186.0)
Retic Ct Pct: 5.2 % — ABNORMAL HIGH (ref 0.4–3.1)

## 2020-08-05 LAB — BASIC METABOLIC PANEL
Anion gap: 9 (ref 5–15)
BUN: 17 mg/dL (ref 8–23)
CO2: 27 mmol/L (ref 22–32)
Calcium: 8.6 mg/dL — ABNORMAL LOW (ref 8.9–10.3)
Chloride: 104 mmol/L (ref 98–111)
Creatinine, Ser: 1.06 mg/dL (ref 0.61–1.24)
GFR, Estimated: >60 mL/min (ref >60)
Glucose, Bld: 130 mg/dL — ABNORMAL HIGH (ref 70–99)
Potassium: 4.4 mmol/L (ref 3.5–5.1)
Sodium: 140 mmol/L (ref 135–145)

## 2020-08-05 LAB — FOLATE: Folate: 19.2 ng/mL (ref >5.9)

## 2020-08-05 LAB — FERRITIN: Ferritin: 260 ng/mL (ref 24–336)

## 2020-08-05 LAB — PATHOLOGIST SMEAR REVIEW

## 2020-08-05 MED ORDER — HYDROCODONE-ACETAMINOPHEN 5-325 MG PO TABS: 1.0000 | ORAL_TABLET | ORAL | 0 refills | Status: DC | PRN

## 2020-08-05 MED ORDER — METHOCARBAMOL 500 MG PO TABS: 500.0000 mg | ORAL_TABLET | Freq: Four times a day (QID) | ORAL | 0 refills | Status: AC | PRN

## 2020-08-05 MED ORDER — ASPIRIN 81 MG PO CHEW: 81.0000 mg | CHEWABLE_TABLET | Freq: Two times a day (BID) | ORAL | 0 refills | Status: AC

## 2020-08-05 NOTE — Progress Notes (Addendum)
CSW called pt sister; no answer, left voicemail requesting return call.   1032: Pt sister called back. She confirmed she is okay with plan for SNF. Has preference for [place] area. CSW completed FL2 and faxed out bed requests in hub.

## 2020-08-05 NOTE — Evaluation (Signed)
Physical Therapy Evaluation Patient Details Name: [name] MRN: [id number] DOB: 1950-11-04 Today's Date: 08/05/2020   History of Present Illness  70 yo male with a fall in [place] on a trip waited to come to hosp, now has ORIF R femur with 50% WB permitted from R intertrochanteric comminuted fracture and had IM nailing.  Pt in 8/10 pain, unable to stand up fully.  PMHx:  CVA with R hemiparesis, HTN, HLD, Hep C, cirrhosis, falls, glaucoma, BPH, cataracts, colon polyps  Clinical Impression  Pt was seen for initial mobility on RW and then to practice sit to stand on side of bed.  Pt is fearful to stand up and was not able to muster a second pair of hands before he was too tired to reattempt.  Talked with nursing about pain meds and that pt might be much more capable to stand with appropriate pain management.  Follow acutely for new goals of PT and progress him as tolerated to get standing and stepping tasks accomplished.  Reduce fall risk and increase independence and safety awareness.    Follow Up Recommendations SNF    Equipment Recommendations  None recommended by PT    Recommendations for Other Services       Precautions / Restrictions Precautions Precautions: Fall Precaution Comments: R hemiparesis Restrictions Weight Bearing Restrictions: Yes RLE Weight Bearing: Partial weight bearing RLE Partial Weight Bearing Percentage or Pounds: 50%      Mobility  Bed Mobility Overal bed mobility: Needs Assistance Bed Mobility: Rolling;Sidelying to Sit;Sit to Sidelying Rolling: Mod assist Sidelying to sit: Mod assist     Sit to sidelying: Mod assist General bed mobility comments: needs help due to pain on RLE and has not received more meds yet    Transfers Overall transfer level: Needs assistance Equipment used: Rolling walker (2 wheeled);1 person hand held assist Transfers: Sit to/from Stand Sit to Stand: Total assist         General transfer comment: pt can push up on  bed wtih UE's to lift hips but full standing is dependent  Ambulation/Gait             General Gait Details: unable  Stairs            Wheelchair Mobility    Modified Rankin (Stroke Patients Only)       Balance Overall balance assessment: Needs assistance Sitting-balance support: Bilateral upper extremity supported;Feet supported Sitting balance-Leahy Scale: Fair                                       Pertinent Vitals/Pain Pain Assessment: 0-10 Pain Score: 8  Pain Location: R hip Pain Descriptors / Indicators: Operative site guarding;Grimacing;Guarding Pain Intervention(s): Limited activity within patient's tolerance;Monitored during session;Premedicated before session;Patient requesting pain meds-RN notified;Repositioned    Home Living Family/patient expects to be discharged to:: Skilled nursing facility Living Arrangements: Other relatives Available Help at Discharge: Family           Home Equipment: Dan Humphreys - 2 wheels;Cane - single point;Shower seat Additional Comments: pt was on walker when he fell    Prior Function Level of Independence: Independent with assistive device(s)         Comments: pt had gone through rehab for CVA prior to this fall     Hand Dominance   Dominant Hand: Right    Extremity/Trunk Assessment   Upper Extremity Assessment Upper Extremity Assessment:  Overall WFL for tasks assessed    Lower Extremity Assessment Lower Extremity Assessment: RLE deficits/detail RLE Deficits / Details: hip in IR, pt is unable to lift RLE at all RLE Coordination: decreased gross motor    Cervical / Trunk Assessment Cervical / Trunk Assessment: Normal  Communication   Communication: Expressive difficulties  Cognition Arousal/Alertness: Awake/alert Behavior During Therapy: Flat affect Overall Cognitive Status: No family/caregiver present to determine baseline cognitive functioning                                  General Comments: unsure of his baseline      General Comments General comments (skin integrity, edema, etc.): pt unable to fully stand and is dependent to partially stand.  Requires dense cues for sequence and sfety on side of bed to initiate standing but without help can clear hips on side of bed    Exercises Other Exercises Other Exercises: worked on ER on R hip to help with locating foot on floor to stand   Assessment/Plan    PT Assessment Patient needs continued PT services  PT Problem List Decreased strength;Decreased range of motion;Decreased activity tolerance;Decreased balance;Decreased mobility;Decreased coordination;Decreased cognition;Decreased knowledge of use of DME;Decreased safety awareness;Cardiopulmonary status limiting activity;Decreased skin integrity;Pain       PT Treatment Interventions DME instruction;Gait training;Functional mobility training;Therapeutic activities;Therapeutic exercise;Balance training;Neuromuscular re-education;Patient/family education    PT Goals (Current goals can be found in the Care Plan section)  Acute Rehab PT Goals Patient Stated Goal: to get stronger and not hurt PT Goal Formulation: With patient Time For Goal Achievement: 08/19/20 Potential to Achieve Goals: Good    Frequency Min 3X/week   Barriers to discharge Inaccessible home environment;Decreased caregiver support home with family but unclear how much help will be availiable    Co-evaluation               AM-PAC PT "6 Clicks" Mobility  Outcome Measure Help needed turning from your back to your side while in a flat bed without using bedrails?: A Lot Help needed moving from lying on your back to sitting on the side of a flat bed without using bedrails?: A Lot Help needed moving to and from a bed to a chair (including a wheelchair)?: Total Help needed standing up from a chair using your arms (e.g., wheelchair or bedside chair)?: Total Help needed to walk in  hospital room?: Total Help needed climbing 3-5 steps with a railing? : Total 6 Click Score: 8    End of Session Equipment Utilized During Treatment: Gait belt Activity Tolerance: Patient limited by fatigue;Patient limited by pain Patient left: in bed;with call bell/phone within reach;with bed alarm set Nurse Communication: Mobility status;Other (comment);Weight bearing status (voiced need for two person help to get OOB with lift) PT Visit Diagnosis: Muscle weakness (generalized) (M62.81);Difficulty in walking, not elsewhere classified (R26.2);Pain;Hemiplegia and hemiparesis;History of falling (Z91.81) Hemiplegia - Right/Left: Right Hemiplegia - dominant/non-dominant: Dominant Hemiplegia - caused by: Unspecified Pain - Right/Left: Right Pain - part of body: Hip    Time: [id number] PT Time Calculation (min) (ACUTE ONLY): 39 min   Charges:   PT Evaluation $PT Eval Moderate Complexity: 1 Mod PT Treatments $Therapeutic Activity: 23-37 mins       Ivar Drape 08/05/2020, 2:05 PM  Samul Dada, PT MS Acute Rehab Dept. Number: [facility] [id number] and [facility] [phone]

## 2020-08-05 NOTE — TOC CAGE-AID Note (Signed)
Transition of Care [facility]) - CAGE-AID Screening   Patient Details  Name: [name] MRN: [id number] Date of Birth: February 05, 1951   Clinical Narrative: Patient endorses previous alcohol and drug use but is not currently using either. No resources needed at this time.   CAGE-AID Screening:    Have You Ever Felt You Ought to Cut Down on Your Drinking or Drug Use?: No Have People Annoyed You By Critizing Your Drinking Or Drug Use?: No Have You Felt Bad Or Guilty About Your Drinking Or Drug Use?: No Have You Ever Had a Drink or Used Drugs First Thing In The Morning to Steady Your Nerves or to Get Rid of a Hangover?: No CAGE-AID Score: 0  Substance Abuse Education Offered: No

## 2020-08-05 NOTE — NC FL2 (Signed)
[place] MEDICAID FL2 LEVEL OF CARE SCREENING TOOL     IDENTIFICATION  Patient Name: [name] Birthdate: 08/29/1950 Sex: male Admission Date (Current Location): 08/03/2020  [facility] and [place] Number:  Herbalist and Address:  [facility] [place], [place] [address], [place], [place] [address]      Provider Number: [id number]  Attending Physician Name and Address:  Edwin Dada, *  Relative Name and Phone Number:       Current Level of Care: Hospital Recommended Level of Care: [facility] Prior Approval Number:    Date Approved/Denied:   PASRR Number: [id number]  Discharge Plan: SNF    Current Diagnoses: Patient Active Problem List   Diagnosis Date Noted  . Closed right hip fracture, initial encounter ([place]) 08/03/2020  . History of stroke 08/03/2020  . 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 Unilateral primary osteoarthritis, right knee 04/24/2018  . Unilateral primary osteoarthritis, left knee 04/24/2018  . Gait disturbance, post-stroke 01/21/2015  . Arrhythmia 12/02/2014  . Palpitations 12/02/2014  . Cerebral infarction due to thrombosis of left middle cerebral artery ([place]) 12/02/2014  . HLD (hyperlipidemia) 12/02/2014  . Right hemiparesis ([place]) 09/25/2014  . Other secondary osteoarthritis of both knees 09/25/2014  . Basal ganglia infarction ([place]) 09/25/2014  . Ischemic stroke ([place]) 09/22/2014  . Dyslipidemia 09/22/2014  . Thrombocytopenia ([place]) 09/22/2014  . Essential hypertension   . Stroke ([place]) 09/21/2014  . Uncontrolled hypertension 09/21/2014    Orientation RESPIRATION BLADDER Height & Weight     Self,Place  Normal Continent Weight: 175 lb 14.8 oz (79.8 kg) Height:  5\' 7"  (170.2 cm)  BEHAVIORAL SYMPTOMS/MOOD NEUROLOGICAL BOWEL NUTRITION STATUS      Continent Diet (see d/c summary)  AMBULATORY STATUS COMMUNICATION OF NEEDS Skin   Extensive Assist Verbally  Surgical wounds (incision right hip)                       Personal Care Assistance Level of Assistance  Bathing,Feeding,Dressing Bathing Assistance: Limited assistance Feeding assistance: Independent Dressing Assistance: Limited assistance     Functional Limitations Info  Sight,Hearing,Speech Sight Info: Adequate Hearing Info: Adequate Speech Info: Adequate    SPECIAL CARE FACTORS FREQUENCY  PT (By licensed PT),OT (By licensed OT)     PT Frequency: 5x/week OT Frequency: 5x/week            Contractures Contractures Info: Not present    Additional Factors Info  Code Status,Allergies Code Status Info: Full Allergies Info: no known allergies           Current Medications (08/05/2020):  This is the current hospital active medication list Current Facility-Administered Medications  Medication Dose Route Frequency Provider Last Rate Last Admin  . 0.9 %  sodium chloride infusion   Intravenous Continuous Maurice March, PA-C 100 mL/hr at 08/04/20 2313 New Bag at 08/04/20 2313  . acetaminophen (TYLENOL) tablet 325-650 mg  325-650 mg Oral Q6H PRN Maurice March, PA-C      . alum & mag hydroxide-simeth (MAALOX/MYLANTA) 200-200-20 MG/5ML suspension 15 mL  15 mL Oral Q4H PRN Maurice March, PA-C      . amLODipine (NORVASC) tablet 10 mg  10 mg Oral q AM Griffith Citron R, PA-C   10 mg at 08/05/20 [id number]  . aspirin chewable tablet 81 mg  81 mg Oral BID Maurice March, PA-C   81 mg at 08/05/20 0947  . atorvastatin (LIPITOR)  tablet 10 mg  10 mg Oral Daily Tommie Ard, PA-C      . bisacodyl (DULCOLAX) EC tablet 5 mg  5 mg Oral Daily PRN Tommie Ard, PA-C      . bisacodyl (DULCOLAX) suppository 10 mg  10 mg Rectal Daily PRN Tommie Ard, PA-C      . cycloSPORINE (RESTASIS) 0.05 % ophthalmic emulsion 1 drop  1 drop Both Eyes BID Jonah Blue, MD   1 drop at 08/05/20 0948  . diphenhydrAMINE (BENADRYL) 12.5 MG/5ML elixir 12.5-25 mg  12.5-25 mg Oral Q4H PRN  Tommie Ard, PA-C      . docusate sodium (COLACE) capsule 100 mg  100 mg Oral BID Tommie Ard, PA-C   100 mg at 08/05/20 0948  . ferrous sulfate tablet 325 mg  325 mg Oral TID PC Tommie Ard, PA-C   325 mg at 08/05/20 7829  . HYDROcodone-acetaminophen (NORCO) 7.5-325 MG per tablet 1-2 tablet  1-2 tablet Oral Q4H PRN Tommie Ard, PA-C      . HYDROcodone-acetaminophen (NORCO/VICODIN) 5-325 MG per tablet 1-2 tablet  1-2 tablet Oral Q4H PRN Tommie Ard, PA-C   1 tablet at 08/05/20 0610  . lisinopril (ZESTRIL) tablet 10 mg  10 mg Oral Daily Tommie Ard, PA-C   10 mg at 08/05/20 5621  . magnesium citrate solution 1 Bottle  1 Bottle Oral Once PRN Tommie Ard, PA-C      . menthol-cetylpyridinium (CEPACOL) lozenge 3 mg  1 lozenge Oral PRN Dennie Bible R, PA-C       Or  . phenol (CHLORASEPTIC) mouth spray 1 spray  1 spray Mouth/Throat PRN Tommie Ard, PA-C      . methocarbamol (ROBAXIN) tablet 500 mg  500 mg Oral Q6H PRN Tommie Ard, PA-C   500 mg at 08/05/20 3086   Or  . methocarbamol (ROBAXIN) 500 mg in dextrose 5 % 50 mL IVPB  500 mg Intravenous Q6H PRN Tommie Ard, PA-C      . metoCLOPramide (REGLAN) tablet 5-10 mg  5-10 mg Oral Q8H PRN Tommie Ard, PA-C       Or  . metoCLOPramide (REGLAN) injection 5-10 mg  5-10 mg Intravenous Q8H PRN Tommie Ard, PA-C      . metoprolol tartrate (LOPRESSOR) tablet 12.5 mg  12.5 mg Oral BID Tommie Ard, PA-C   12.5 mg at 08/05/20 0947  . morphine 2 MG/ML injection 0.5-1 mg  0.5-1 mg Intravenous Q2H PRN Tommie Ard, PA-C      . ondansetron [facility]) tablet 4 mg  4 mg Oral Q6H PRN Dennie Bible R, PA-C       Or  . ondansetron (ZOFRAN) injection 4 mg  4 mg Intravenous Q6H PRN Dennie Bible R, PA-C      . polyethylene glycol (MIRALAX / GLYCOLAX) packet 17 g  17 g Oral BID Tommie Ard, PA-C   17 g at 08/05/20 0947  . tamsulosin (FLOMAX) capsule 0.4 mg  0.4 mg Oral QHS Tommie Ard, PA-C   0.4 mg at 08/04/20 2300     Discharge Medications: Please see discharge summary for a list of discharge medications.  Relevant Imaging Results:  Relevant Lab Results:   Additional Information SSN [id number] [place], [place]

## 2020-08-05 NOTE — Progress Notes (Signed)
[name] PROGRESS NOTE    [name]  JAS:[id number] DOB: April 28, 1951 DOA: 08/03/2020 PCP: Corine Shelter, MD      Brief Narrative:  [name] is a 70 y.o. M with CVA, HTN, hep C cirrhosis who presented after a fall while visiting family in [place].  He was walking and started on [place] with his niece and had an uneven spot and fell landing on his right hip.  He was able to walk briefly with his walker.  He was able to return home, where he came to the ER.  Here x-ray showed hip fracture, present for 10 days.  Orthopedics were consulted who recommended Buck's traction, Robaxin, and to the OR .       Assessment & Plan:  Hip fracture Hip fracture more than 10 days ago, delayed seeking medical care.  Imaging with comminuted displaced intertrochanteric right fracture with surrounding muscular injury and hematoma. -Consult orthopedics, appreciate recommendations  Hep C cirrhosis Compensated  Hypertension BP soft post-op -Continue amlodipine, atorvastatin, metoprolol -Hold chlorthalidone, lisinopril  BPH -Contineu Flomax  History of cerebrovascular disease -Continue atorvastatin  Anemia Due to acute blood loss due to fracture.  Received 2 units post op last night, Hgb this morning good -Repeat CBC tomorrow     Disposition: Status is: Inpatient  Remains inpatient appropriate because:Inpatient level of care appropriate due to severity of illness   Dispo: The patient is from: Home              Anticipated d/c is to: SNF              Anticipated d/c date is: 3 days              Patient currently is not medically stable to d/c.              MDM: The below labs and imaging reports were reviewed and summarized above.  Medication management as above.    DVT prophylaxis: SCDs Start: 08/04/20 2108 Place TED hose Start: 08/04/20 2108 SCDs Start: 08/03/20 1820  Code Status: FULL Family Communication:          Subjective: Patient with a lot of swelling and pain in his right leg.  No fever, confusion, dyspnea, vomiting, orthopnea  Objective: Vitals:   08/04/20 2331 08/05/20 0449 08/05/20 0901 08/05/20 1300  BP: 111/65 114/70 120/69 109/65  Pulse: 93 85 90 67  Resp: 18 16 17 17   Temp: 99.1 F (37.3 C) 98.9 F (37.2 C) 99.4 F (37.4 C) 98.7 F (37.1 C)  TempSrc: Oral Oral Oral Oral  SpO2: 100% 99% 97% 100%  Weight:      Height:        Intake/Output Summary (Last 24 hours) at 08/05/2020 1815 Last data filed at 08/05/2020 0800 Gross per 24 hour  Intake 1063.12 ml  Output 450 ml  Net 613.12 ml   Filed Weights   08/03/20 1413  Weight: 79.8 kg    Examination: General appearance: Adult male, sitting on the edge of the bed with physical therapy, interactive, tired     HEENT: Oropharynx moist, no oral lesions, dentition in good repair, lips normal Skin: Bruising of the right leg. Cardiac: RRR, no murmurs Respiratory: Normal respiratory rate and rhythm, lungs clear without rales or wheezes. Abdomen: Abdomen soft no tenderness palpation or guarding, no ascites or distention MSK:  Neuro: Awake and alert, sitting edge of the bed, extraocular was intact, moves upper extremities with normal strength and coordination, speech  fluent Psych: Sensorium intact responding to questions, attention normal, affect normal, judgment insight appear normal     Data Reviewed: I have personally reviewed following labs and imaging studies:  CBC: Recent Labs  Lab 08/03/20 1410 08/04/20 0553 08/05/20 0249  WBC 10.2 8.8 15.9*  NEUTROABS 7.5  --   --   HGB 8.0* 8.3* 9.3*  HCT 25.8* 25.2* 27.5*  MCV 97.4 96.9 91.7  PLT 275 270 0000000   Basic Metabolic Panel: Recent Labs  Lab 08/03/20 1410 08/04/20 0553 08/05/20 0249  NA 136 140 140  K 3.7 3.8 4.4  CL 98 100 104  CO2 26 27 27   GLUCOSE 114* 112* 130*  BUN 36* 23 17  CREATININE 1.25* 0.99 1.06  CALCIUM 8.9 8.8* 8.6*    GFR: Estimated Creatinine Clearance: 64.7 mL/min (by C-G formula based on SCr of 1.06 mg/dL). Liver Function Tests: Recent Labs  Lab 08/04/20 0553  AST 46*  ALT 43  ALKPHOS 58  BILITOT 2.2*  PROT 5.9*  ALBUMIN 2.8*   No results for input(s): LIPASE, AMYLASE in the last 168 hours. No results for input(s): AMMONIA in the last 168 hours. Coagulation Profile: Recent Labs  Lab 08/03/20 1410  INR 1.1   Cardiac Enzymes: Recent Labs  Lab 08/03/20 1410  CKTOTAL 925*   BNP (last 3 results) No results for input(s): PROBNP in the last 8760 hours. HbA1C: No results for input(s): HGBA1C in the last 72 hours. CBG: No results for input(s): GLUCAP in the last 168 hours. Lipid Profile: No results for input(s): CHOL, HDL, LDLCALC, TRIG, CHOLHDL, LDLDIRECT in the last 72 hours. Thyroid Function Tests: No results for input(s): TSH, T4TOTAL, FREET4, T3FREE, THYROIDAB in the last 72 hours. Anemia Panel: Recent Labs    08/05/20 0249  VITAMINB12 2,482*  FOLATE 19.2  FERRITIN 260  TIBC 273  IRON 38*  RETICCTPCT 5.2*   Urine analysis:    Component Value Date/Time   COLORURINE YELLOW 09/21/2014 [place] 09/21/2014 1641   LABSPEC 1.019 09/21/2014 1641   PHURINE 5.5 09/21/2014 1641   GLUCOSEU NEGATIVE 09/21/2014 1641   HGBUR NEGATIVE 09/21/2014 1641   BILIRUBINUR NEGATIVE 09/21/2014 1641   KETONESUR 15 (A) 09/21/2014 1641   PROTEINUR NEGATIVE 09/21/2014 1641   UROBILINOGEN 0.2 09/21/2014 1641   NITRITE NEGATIVE 09/21/2014 1641   LEUKOCYTESUR TRACE (A) 09/21/2014 1641   Sepsis Labs: @LABRCNTIP (procalcitonin:4,lacticacidven:4)  ) Recent Results (from the past 240 hour(s))  Resp Panel by RT-PCR (Flu A&B, Covid) Nasopharyngeal Swab     Status: None   Collection Time: 08/03/20  2:56 PM   Specimen: Nasopharyngeal Swab; Nasopharyngeal(NP) swabs in vial transport medium  Result Value Ref Range Status   SARS Coronavirus 2 by RT PCR NEGATIVE NEGATIVE Final     Comment: (NOTE) SARS-CoV-2 target nucleic acids are NOT DETECTED.  The SARS-CoV-2 RNA is generally detectable in upper respiratory specimens during the acute phase of infection. The lowest concentration of SARS-CoV-2 viral copies this assay can detect is 138 copies/mL. A negative result does not preclude SARS-Cov-2 infection and should not be used as the sole basis for treatment or other patient management decisions. A negative result may occur with  improper specimen collection/handling, submission of specimen other than nasopharyngeal swab, presence of viral mutation(s) within the areas targeted by this assay, and inadequate number of viral copies(<138 copies/mL). A negative result must be combined with clinical observations, patient history, and epidemiological information. The expected result is Negative.  Fact Sheet for Patients:  EntrepreneurPulse.com.au  Fact Sheet for Healthcare Providers:  IncredibleEmployment.be  This test is no t yet approved or cleared by the [place] FDA and  has been authorized for detection and/or diagnosis of SARS-CoV-2 by FDA under an Emergency Use Authorization (EUA). This EUA will remain  in effect (meaning this test can be used) for the duration of the COVID-19 declaration under Section 564(b)(1) of the Act, 21 U.S.C.section 360bbb-3(b)(1), unless the authorization is terminated  or revoked sooner.       Influenza A by PCR NEGATIVE NEGATIVE Final   Influenza B by PCR NEGATIVE NEGATIVE Final    Comment: (NOTE) The Xpert Xpress SARS-CoV-2/FLU/RSV plus assay is intended as an aid in the diagnosis of influenza from Nasopharyngeal swab specimens and should not be used as a sole basis for treatment. Nasal washings and aspirates are unacceptable for Xpert Xpress SARS-CoV-2/FLU/RSV testing.  Fact Sheet for Patients: EntrepreneurPulse.com.au  Fact Sheet for Healthcare  Providers: IncredibleEmployment.be  This test is not yet approved or cleared by the [place] FDA and has been authorized for detection and/or diagnosis of SARS-CoV-2 by FDA under an Emergency Use Authorization (EUA). This EUA will remain in effect (meaning this test can be used) for the duration of the COVID-19 declaration under Section 564(b)(1) of the Act, 21 U.S.C. section 360bbb-3(b)(1), unless the authorization is terminated or revoked.  Performed at [facility], [place] [address] [place], [place] [address]          Radiology Studies: DG Pelvis Portable  Result Date: 08/04/2020 CLINICAL DATA:  70 year old male status post ORIF of the right femur. EXAM: PORTABLE PELVIS 1-2 VIEWS COMPARISON:  Pelvic radiograph dated 08/03/2020. FINDINGS: Status post ORIF the right femoral neck fracture. The hardware appears intact. No dislocation. Postsurgical changes and soft tissue air in the right hip. IMPRESSION: Status post ORIF of the right femoral neck fracture. No dislocation. Electronically Signed   By: Anner Crete M.D.   On: 08/04/2020 18:39   DG CHEST PORT 1 VIEW  Result Date: 08/03/2020 CLINICAL DATA:  70 year old male with history of right femur fracture. Preoperative chest x-ray. EXAM: PORTABLE CHEST 1 VIEW COMPARISON:  Chest x-ray 04/09/2020. FINDINGS: Lung volumes are normal. Mild elevation of the left hemidiaphragm (chronic). No consolidative airspace disease. No pleural effusions. No pneumothorax. Several tiny calcified granulomas are noted in the right lung. No other suspicious appearing pulmonary nodule or mass noted. Pulmonary vasculature and the cardiomediastinal silhouette are within normal limits. Atherosclerosis in the thoracic aorta. IMPRESSION: 1. No radiographic evidence of acute cardiopulmonary disease. 2. Aortic atherosclerosis. Electronically Signed   By: Vinnie Langton M.D.   On: 08/03/2020 19:05   DG C-Arm 1-60 Min  Result Date:  08/04/2020 CLINICAL DATA:  70 year old male status post ORIF of the right femoral neck fracture. EXAM: DG C-ARM 1-60 MIN; RIGHT FEMUR 2 VIEWS COMPARISON:  Right hip radiograph dated 08/03/2020. FLUOROSCOPY TIME:  Two intraoperative fluoroscopic spot images provided. The total fluoroscopic time is 58 seconds. Status post ORIF of the femoral neck fracture. FINDINGS: ORIF of the right femoral neck fracture. Electronically Signed   By: Anner Crete M.D.   On: 08/04/2020 17:44   DG FEMUR, MIN 2 VIEWS RIGHT  Result Date: 08/04/2020 CLINICAL DATA:  69 year old male status post ORIF of the right femoral neck fracture. EXAM: DG C-ARM 1-60 MIN; RIGHT FEMUR 2 VIEWS COMPARISON:  Right hip radiograph dated 08/03/2020. FLUOROSCOPY TIME:  Two intraoperative fluoroscopic spot images provided. The total fluoroscopic time is 58 seconds. Status  post ORIF of the femoral neck fracture. FINDINGS: ORIF of the right femoral neck fracture. Electronically Signed   By: Anner Crete M.D.   On: 08/04/2020 17:44        Scheduled Meds: . amLODipine  10 mg Oral q AM  . aspirin  81 mg Oral BID  . atorvastatin  10 mg Oral Daily  . cycloSPORINE  1 drop Both Eyes BID  . docusate sodium  100 mg Oral BID  . ferrous sulfate  325 mg Oral TID PC  . lisinopril  10 mg Oral Daily  . metoprolol tartrate  12.5 mg Oral BID  . polyethylene glycol  17 g Oral BID  . tamsulosin  0.4 mg Oral QHS   Continuous Infusions: . sodium chloride 100 mL/hr at 08/04/20 2313  . methocarbamol (ROBAXIN) IV       LOS: 2 days    Time spent: 25 minutes    Edwin Dada, MD [facility] 08/05/2020, 6:15 PM     Please page though [place] or Epic secure chat:  For [employer], Adult nurse

## 2020-08-05 NOTE — Progress Notes (Signed)
     Subjective: 1 Day Post-Op Procedure(s) (LRB): INTRAMEDULLARY (IM) NAIL INTERTROCHANTRIC (Right)   Patient reports pain as mild, much better than it was prior to surgery.  No reported events throughout the night.  Discussed the procedure and expectations moving forward.      Objective:   VITALS:   Vitals:   08/04/20 2331 08/05/20 0449  BP: 111/65 114/70  Pulse: 93 85  Resp: 18 16  Temp: 99.1 F (37.3 C) 98.9 F (37.2 C)  SpO2: 100% 99%    Dorsiflexion/Plantar flexion intact Incision: scant drainage No cellulitis present Compartment soft  LABS Recent Labs    08/03/20 1410 08/04/20 0553 08/05/20 0249  HGB 8.0* 8.3* 9.3*  HCT 25.8* 25.2* 27.5*  WBC 10.2 8.8 15.9*  PLT 275 270 243    Recent Labs    08/03/20 1410 08/04/20 0553 08/05/20 0249  NA 136 140 140  K 3.7 3.8 4.4  BUN 36* 23 17  CREATININE 1.25* 0.99 1.06  GLUCOSE 114* 112* 130*     Assessment/Plan: 1 Day Post-Op Procedure(s) (LRB): INTRAMEDULLARY (IM) NAIL INTERTROCHANTRIC (Right)  Advance diet Up with therapy - PWB Discharge disposition TBD   Ortho recommendations:  ASA 81 mg bid for 4 weeks for anticoagulation, unless other medically indicated.  Norco for pain management (Rx written).  MiraLax and Colace for constipation  Iron 325 mg tid for 2-3 weeks   PWB 50% on the right leg.  Dressing to remain in place until follow in clinic in 2 weeks.  Dressing is waterproof and may shower with it in place.  Follow up in 2 weeks at [facility]. Follow up with OLIN,Kristoffer Bala D in 2 weeks.  Contact information:  [facility] [address], [address] [place] [address] [phone]             Lanney Gins PA-C  [facility]  [facility] [address], [place], [place] [address] Phone: [phone] [email or web address] Facebook  [employer]

## 2020-08-05 NOTE — Op Note (Signed)
NAME: [name], [name] MEDICAL RECORD WU:[id number] ACCOUNT [id number] DATE OF BIRTH:08/01/1951 FACILITY: [facility] LOCATION: [facility] PHYSICIAN:Elysia Grand Rosalia Hammers, MD  OPERATIVE REPORT  DATE OF PROCEDURE:  08/04/2020  PREOPERATIVE DIAGNOSIS:  Comminuted right intertrochanteric hip fracture.  POSTOPERATIVE DIAGNOSIS:  Comminuted right intertrochanteric hip fracture.  PROCEDURE:  Open reduction internal fixation of right intertrochanteric femur fracture utilizing the Biomet intramedullary troch nail, 11 x 180 mm with a 130-degree lag screw measuring 110 mm and a distal interlock.  SURGEON:  Durene Romans, MD  ASSISTANT:  Dennie Bible, PA-C  Note that Ms. [name] was present for the entirety of the case from preoperative positioning, perioperative management of the extremity during the procedure, assistance with reduction and maintenance of reduction due to the comminuted nature and complex  reduction.  She was also involved with primary wound closure.  ANESTHESIA:  General.  BLOOD LOSS:  About 150 mL.  DRAINS:  None.  COMPLICATIONS:  None.  INDICATIONS:  The patient is a 70 year old male who presented to the [facility] on 08/03/2020.  He presented after a fall 10 days prior in [place].  He did not seek medical attention in [place], but instead was eventually self-transported back to  [place] on the train.  He had pain and significant swelling in his right lower extremity.  Radiographs revealed a comminuted intertrochanteric femur fracture.  Necessitated repair was discussed.  The risk of infection, malunion, nonunion and need for  future surgeries were discussed.  The fact that significant comminuted nature of his fracture would also lead to chronic pain and limp based on the displaced greater trochanter portion of the intertrochanteric segment.  Consent was obtained for  management of his pain and stability of the fracture site to allow for healing and improved  function.  DESCRIPTION OF PROCEDURE:  The patient was brought to the operative theater.  Once adequate anesthesia, preoperative antibiotics, Ancef administered as well as tranexamic acid, he was positioned supine on the fracture table.  The left unaffected  extremity was flexed and abducted out of the way with all bony prominences padded, particularly over the peroneal nerve laterally.  His right foot was placed in the traction boot.  Once safely and well-padded and secure, I applied traction with internal  rotation to the left lower extremity.  We evaluated the fracture under fluoroscopy, further confirming the significant comminution with displacement of the greater trochanter as well as the lesser trochanter in this intertrochanteric fracture.  Once I  confirmed near anatomic reduction in both planes, we prepped and draped the lateral aspect of the right hip with a shower curtain technique.  A timeout was performed, identifying the patient, the planned procedure and extremity.  Fluoroscopy was brought  back to the field.  The tip of the trochanter on the top of the proximal lateral hip was identified.  I then made an incision down to the gluteal fascia, which was then split.  When we entered this layer, there was significant hematoma and seroma that  was all in the posterior aspect of his gluteus.  I did spend time at this point, evacuating all of this blood from his hip to try to decompress and provide some pain relief.  Once this was completed, we worked at fixation per technique.  The proximal  aspect of the proximal femur was wide open.  I did attempt to restore the anatomy of the greater trochanter with an uncertain success by grabbing the greater trochanteric piece and trying to put it back into its  anatomic position over the top of the  femur.  I then was able to pass a guidewire through this into the proximal femur.  I then reamed over top of this.  I am worried that despite this effort, after  passing the nail and evaluating that there was some further comminution of this that resulted  in medial displacement of his greater trochanter piece.  Nonetheless, the 11 x 180 mm nail was selected and passed into the proximal femur across the fracture site.  Once this was done, I removed the Kocher clamp from the greater trochanter segment.   Once we had the nail into its appropriate depth, I made an incision laterally for the guidewire, but also to help with reduction of the femoral neck segment as there was some flexion through the fracture.  Once I had it positioned in AP and lateral  planes to near anatomic position, we passed a guidewire into the close proximity of the center of the head in AP plane and slightly posterior central on the lateral view.  I selected a 110 mm lag screw.  I drilled, the lag screw was then placed.  With  the lag screw in position, I then used the compression wheel and was able to medialize the shaft to the fracture with good solid fixation.  Based on the severe comminution of his fracture, I elected to lock this as a locking fixed angle device as opposed  to allow for further compression.  The locking pin was locked proximally.  I then removed the lag screw guide and placed a distal locking screw in the static position.  The jig was then removed.  The fluoroscopy images were obtained.  The wounds were  all irrigated with normal saline solution.  The proximal wound was closed in layers with #1 Vicryl on the gluteal fascia and on the iliotibial band and the middle incision with #1 Vicryl.  The remainder of the wounds were closed with 2-0 Vicryl and a  running Monocryl stitch.  The hip wounds were then cleaned, dried and dressed sterilely using surgical glue and Aquacel dressing.  He was then brought to the recovery room in stable condition, tolerating the procedure well.  Postoperatively, I will allow  him to be partial weightbearing for 6-8 weeks to allow for healing.  We  will see him back in the office in 2 weeks for clinical and radiographic followup.  HN/NUANCE  D:08/04/2020 T:08/05/2020 JOB:[id number]

## 2020-08-05 NOTE — Plan of Care (Signed)
  Problem: Education: Goal: Knowledge of General Education information will improve Description Including pain rating scale, medication(s)/side effects and non-pharmacologic comfort measures Outcome: Progressing   Problem: Clinical Measurements: Goal: Ability to maintain clinical measurements within normal limits will improve Outcome: Progressing   Problem: Clinical Measurements: Goal: Will remain free from infection Outcome: Progressing   Problem: Activity: Goal: Risk for activity intolerance will decrease Outcome: Progressing   Problem: Pain Managment: Goal: General experience of comfort will improve Outcome: Progressing   Problem: Safety: Goal: Ability to remain free from injury will improve Outcome: Progressing   

## 2020-08-05 NOTE — Progress Notes (Signed)
Nutrition Brief Note  RD consulted for assessment due to hip fracture.  Wt Readings from Last 15 Encounters:  08/03/20 79.8 kg  05/24/19 79.8 kg  04/18/19 80.1 kg  08/26/15 78.7 kg  02/26/15 79.2 kg  12/02/14 74.8 kg  10/02/14 72.8 kg  09/21/14 78.9 kg   [name] is a 70 y.o. male with medical history significant of CVA; HTN; HLD; and Hep C cirrhosis presenting with a fall on 12/23 while visiting family in [place].  He was on vacation in [place], [place], and visiting his niece.  The last time he was there, he went for a walk with his walker.  He hit an uneven spot and fell.  He landed on his R hip.  Some people helped him up and he was able to walk a little with his walker.  His sister got him back to the room.  He took the train back on 12/28.  They filed a claim against the hotel and were waiting on that to get settled before he came in.  He has not been able to his ADLs.  Pt admitted with closed rt hip fracture.   1/3- s/p Procedure(s): INTRAMEDULLARY (IM) NAIL INTERTROCHANTRIC (Right)  Reviewed wt hx; wt has been stable over the past year.   Medications reviewed and include decadron, colace, ferrous sulfate, and miralax.   Labs reviewed.  Current diet order is regular, patient is consuming approximately 100% of meals at this time. Labs and medications reviewed.   No nutrition interventions warranted at this time. If nutrition issues arise, please consult RD.   Levada Schilling, RD, LDN, CDCES Registered Dietitian II Certified Diabetes Care and Education Specialist Please refer to [facility] for RD and/or RD on-call/weekend/after hours pager

## 2020-08-06 ENCOUNTER — Inpatient Hospital Stay (HOSPITAL_COMMUNITY): Payer: Medicare HMO

## 2020-08-06 ENCOUNTER — Encounter (HOSPITAL_COMMUNITY): Payer: Self-pay | Admitting: Internal Medicine

## 2020-08-06 DIAGNOSIS — K7469 Other cirrhosis of liver: Secondary | ICD-10-CM | POA: Diagnosis not present

## 2020-08-06 DIAGNOSIS — B1920 Unspecified viral hepatitis C without hepatic coma: Secondary | ICD-10-CM

## 2020-08-06 DIAGNOSIS — Z8673 Personal history of transient ischemic attack (TIA), and cerebral infarction without residual deficits: Secondary | ICD-10-CM

## 2020-08-06 DIAGNOSIS — S72001A Fracture of unspecified part of neck of right femur, initial encounter for closed fracture: Secondary | ICD-10-CM

## 2020-08-06 DIAGNOSIS — I10 Essential (primary) hypertension: Secondary | ICD-10-CM | POA: Diagnosis not present

## 2020-08-06 LAB — BASIC METABOLIC PANEL
Anion gap: 10 (ref 5–15)
BUN: 22 mg/dL (ref 8–23)
CO2: 29 mmol/L (ref 22–32)
Calcium: 8.1 mg/dL — ABNORMAL LOW (ref 8.9–10.3)
Chloride: 100 mmol/L (ref 98–111)
Creatinine, Ser: 1.04 mg/dL (ref 0.61–1.24)
GFR, Estimated: >60 mL/min (ref >60)
Glucose, Bld: 132 mg/dL — ABNORMAL HIGH (ref 70–99)
Potassium: 4.2 mmol/L (ref 3.5–5.1)
Sodium: 139 mmol/L (ref 135–145)

## 2020-08-06 LAB — URINALYSIS, ROUTINE W REFLEX MICROSCOPIC
Bacteria, UA: NONE SEEN
Bilirubin Urine: NEGATIVE
Glucose, UA: NEGATIVE mg/dL
Hgb urine dipstick: NEGATIVE
Ketones, ur: NEGATIVE mg/dL
Nitrite: NEGATIVE
Protein, ur: NEGATIVE mg/dL
Specific Gravity, Urine: 1.019 (ref 1.005–1.030)
pH: 5 (ref 5.0–8.0)

## 2020-08-06 LAB — CBC
HCT: 25.9 % — ABNORMAL LOW (ref 39.0–52.0)
Hemoglobin: 8.1 g/dL — ABNORMAL LOW (ref 13.0–17.0)
MCH: 29.8 pg (ref 26.0–34.0)
MCHC: 31.3 g/dL (ref 30.0–36.0)
MCV: 95.2 fL (ref 80.0–100.0)
Platelets: 221 10*3/uL (ref 150–400)
RBC: 2.72 MIL/uL — ABNORMAL LOW (ref 4.22–5.81)
RDW: 16.8 % — ABNORMAL HIGH (ref 11.5–15.5)
WBC: 18.4 10*3/uL — ABNORMAL HIGH (ref 4.0–10.5)
nRBC: 0 % (ref 0.0–0.2)

## 2020-08-06 NOTE — TOC Progression Note (Addendum)
Transition of Care [facility]) - Progression Note    Patient Details  Name: [name] MRN: [id number] Date of Birth: November 05, 1950  [facility] CM/SW Contact  Erin Sons, [place] Phone Number: 08/06/2020, 3:49 PM  Clinical Narrative:     CSW received call from pt sister requesting referral to [facility] in [place]. CSW sends referral and they are able to accept. CSW discusses snf offer with pt bedside with pt sister on phone. They want to see if [place] is able to accept pt since they are in network and in [place].   CSW called [place] with [facility]. She will review.   1415: Pt sister called explaining she did not want pt to go to [place]. She wants pt to go to [facility]. CSW called pt and he is agreeable to [facility].   CSW called Irving Burton at [facility] and confirmed pt choice. Irving Burton will start auth now.  Expected Discharge Plan: Skilled Nursing Facility Barriers to Discharge: Continued Medical Work up  Expected Discharge Plan and Services Expected Discharge Plan: Skilled Nursing Facility                                               Social Determinants of Health (SDOH) Interventions    Readmission Risk Interventions No flowsheet data found.

## 2020-08-06 NOTE — Progress Notes (Signed)
Physical Therapy Treatment Patient Details Name: [name] MRN: [id number] DOB: 23-Oct-1950 Today's Date: 08/06/2020    History of Present Illness 70 yo male with a fall in [place] on a trip waited to come to hosp, now has ORIF R femur with 50% WB permitted from R intertrochanteric comminuted fracture and had IM nailing.  Pt in 8/10 pain, unable to stand up fully.  PMHx:  CVA with R hemiparesis, HTN, HLD, Hep C, cirrhosis, falls, glaucoma, BPH, cataracts, colon polyps    PT Comments    Pt supine on arrival, agreeable to therapy session at bed-level only, c/o fatigue after recent transfer from chair>bed and increased pain. Pt performed supine RLE A/AAROM therapeutic exercises as detailed below with good tolerance, needing AA with SAQ due to RLE pain/quad weakness, given HEP handout (see education section for handout) and encouraged to perform BID/TID as able. Pt defers EOB/OOB mobility despite max encouragement. Pt will continue to benefit from skilled rehab in a post acute setting to maximize functional gains before returning home.  Follow Up Recommendations  SNF     Equipment Recommendations  None recommended by PT    Recommendations for Other Services       Precautions / Restrictions Precautions Precautions: Fall Precaution Comments: R hemiparesis Restrictions Weight Bearing Restrictions: Yes RLE Weight Bearing: Partial weight bearing RLE Partial Weight Bearing Percentage or Pounds: 50    Mobility  Bed Mobility               General bed mobility comments: pt defers OOB due to pain after recently pivoting back to bed with +2 nursing staff assist  Transfers                    Ambulation/Gait                 Stairs             Wheelchair Mobility    Modified Rankin (Stroke Patients Only)       Balance                                            Cognition Arousal/Alertness: Awake/alert Behavior During Therapy: Flat  affect Overall Cognitive Status: No family/caregiver present to determine baseline cognitive functioning                                 General Comments: oriented, cooperative; fearful of mobility due to pain/fatigue      Exercises General Exercises - Lower Extremity Ankle Circles/Pumps: AROM;AAROM;Both;10 reps;Supine Quad Sets: AROM;Strengthening;Both;10 reps;Supine Gluteal Sets: AROM;Strengthening;Both;10 reps;Supine Short Arc Quad: AAROM;Right;10 reps;Supine Heel Slides: AROM;Strengthening;Right;10 reps;Supine (x10 abd and x10 adduction with pillow) Hip ABduction/ADduction: AROM;Strengthening;Right;10 reps;Supine    General Comments        Pertinent Vitals/Pain Pain Assessment: Faces Faces Pain Scale: Hurts even more Pain Location: R hip Pain Descriptors / Indicators: Operative site guarding;Grimacing;Guarding Pain Intervention(s): Premedicated before session;Repositioned;Ice applied;Limited activity within patient's tolerance    Home Living                      Prior Function            PT Goals (current goals can now be found in the care plan section) Acute Rehab PT Goals Patient Stated Goal: to get  stronger and not hurt PT Goal Formulation: With patient Time For Goal Achievement: 08/19/20 Potential to Achieve Goals: Good Progress towards PT goals: Progressing toward goals (limited session this date 2/2 pain/fatigue)    Frequency    Min 3X/week      PT Plan Current plan remains appropriate    Co-evaluation              AM-PAC PT "6 Clicks" Mobility   Outcome Measure  Help needed turning from your back to your side while in a flat bed without using bedrails?: A Lot Help needed moving from lying on your back to sitting on the side of a flat bed without using bedrails?: A Lot Help needed moving to and from a bed to a chair (including a wheelchair)?: Total Help needed standing up from a chair using your arms (e.g., wheelchair  or bedside chair)?: Total Help needed to walk in hospital room?: Total Help needed climbing 3-5 steps with a railing? : Total 6 Click Score: 8    End of Session   Activity Tolerance: Patient limited by fatigue;Patient limited by pain Patient left: in bed;with call bell/phone within reach;with bed alarm set Nurse Communication: Mobility status;Other (comment);Weight bearing status (voiced need for two person help to get OOB with lift vs Stedy) PT Visit Diagnosis: Muscle weakness (generalized) (M62.81);Difficulty in walking, not elsewhere classified (R26.2);Pain;Hemiplegia and hemiparesis;History of falling (Z91.81) Hemiplegia - Right/Left: Right Hemiplegia - dominant/non-dominant: Dominant Hemiplegia - caused by: Unspecified (hx R hemiplegia s/p stroke in 2015) Pain - Right/Left: Right Pain - part of body: Hip     Time: 1646-1700 PT Time Calculation (min) (ACUTE ONLY): 14 min  Charges:  $Therapeutic Exercise: 8-22 mins                     Dilan Fullenwider P., PTA Acute Rehabilitation Services Pager: [phone] Office: [place] 08/06/2020, 5:18 PM

## 2020-08-06 NOTE — Plan of Care (Signed)
  Problem: Activity: Goal: Risk for activity intolerance will decrease Outcome: Progressing   Problem: Pain Managment: Goal: General experience of comfort will improve Outcome: Progressing   Problem: Skin Integrity: Goal: Risk for impaired skin integrity will decrease Outcome: Progressing   

## 2020-08-06 NOTE — Progress Notes (Signed)
PROGRESS NOTE  Salman Bortner [id number] DOB: 24-Aug-1950 DOA: 08/03/2020 PCP: Vincente Liberty, MD  HPI/Recap of past 24 hours: HPI from Dr Loleta Books Mr. [name] is a 70 y.o. M with CVA, HTN, hep C cirrhosis who presented after a mechanical fall in a hotel landing on his R hip while visiting family in [place]. Shortly after fall, he was able to walk briefly with his walker. Patient delayed his care by waiting for 10 days before coming into the ER. Imaging showed R hip fracture. Patient admitted for further management. Orthopedics consulted, s/p ORIF on 08/04/2020. Currently awaiting SNF placement.    Today, patient denied any new complaints, reports postop pain is well controlled.    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) ([place])   Comminuted right intertrochanteric hip fracture s/p ORIF on 08/04/2020 Imaging showed fracture with surrounding muscular injury and hematoma Pain management, DVT prophylaxis per Ortho (rec ASA 81 mg BID) PT/OT-recommend SNF  Acute blood loss anemia S/p 2 units of PRBC on 08/03/20 Daily CBC  Leukocytosis ??Likely 2/2 from 1 dose of Decadron given on 08/05/2020 UA negative, chest x-ray with no active disease Daily CBC  Hypertension BP stable Continue amlodipine, metoprolol, lisinopril Continue to hold chlorthalidone  History of CVA Continue Lipitor  History of hep C cirrhosis Currently compensated  BPH Continue Flomax       Malnutrition Type:      Malnutrition Characteristics:      Nutrition Interventions:       Estimated body mass index is 27.55 kg/m as calculated from the following:   Height as of this encounter: 5\' 7"  (1.702 m).   Weight as of this encounter: 79.8 kg.     Code Status: Full  Family Communication: None at  bedside  Disposition Plan: Status is: Inpatient  Remains inpatient appropriate because:Inpatient level of care appropriate due to severity of illness   Dispo: The patient is from: Home              Anticipated d/c is to: SNF              Anticipated d/c date is: 1 day              Patient currently is medically stable to d/c.   Consultants:  Orthopedics  Procedures: ORIF on 08/04/2020  Antimicrobials:  None  DVT prophylaxis: SCD, Ortho recommending ASA 81 mg twice daily for 4 weeks for [facility]   Objective: Vitals:   08/05/20 1657 08/05/20 2140 08/06/20 0822 08/06/20 1318  BP: 124/68 119/63 112/69 113/65  Pulse: 70 78 68 66  Resp: 17 18 18 18   Temp: 98.3 F (36.8 C) 98.3 F (36.8 C) 98.3 F (36.8 C) 98 F (36.7 C)  TempSrc: Oral Oral Oral Oral  SpO2: 100% 100% 99% 100%  Weight:      Height:        Intake/Output Summary (Last 24 hours) at 08/06/2020 1409 Last data filed at 08/06/2020 0900 Gross per 24 hour  Intake 240 ml  Output 500 ml  Net -260 ml   Filed Weights   08/03/20 1413  Weight: 79.8 kg    Exam:  General: NAD   Cardiovascular: S1, S2 present  Respiratory: CTAB  Abdomen: Soft, nontender, nondistended, bowel sounds present  Musculoskeletal: No bilateral pedal edema noted, R hip dressing  with slight drainage, intact  Skin: Normal  Psychiatry: Normal mood    Data Reviewed: CBC: Recent Labs  Lab 08/03/20 1410 08/04/20 0553 08/05/20 0249 08/06/20 0137  WBC 10.2 8.8 15.9* 18.4*  NEUTROABS 7.5  --   --   --   HGB 8.0* 8.3* 9.3* 8.1*  HCT 25.8* 25.2* 27.5* 25.9*  MCV 97.4 96.9 91.7 95.2  PLT 275 270 243 [id number]   Basic Metabolic Panel: Recent Labs  Lab 08/03/20 1410 08/04/20 0553 08/05/20 0249 08/06/20 0137  NA 136 140 140 139  K 3.7 3.8 4.4 4.2  CL 98 100 104 100  CO2 26 27 27 29   GLUCOSE 114* 112* 130* 132*  BUN 36* 23 17 22   CREATININE 1.25* 0.99 1.06 1.04  CALCIUM 8.9 8.8* 8.6* 8.1*   GFR: Estimated Creatinine Clearance:  66 mL/min (by C-G formula based on SCr of 1.04 mg/dL). Liver Function Tests: Recent Labs  Lab 08/04/20 0553  AST 46*  ALT 43  ALKPHOS 58  BILITOT 2.2*  PROT 5.9*  ALBUMIN 2.8*   No results for input(s): LIPASE, AMYLASE in the last 168 hours. No results for input(s): AMMONIA in the last 168 hours. Coagulation Profile: Recent Labs  Lab 08/03/20 1410  INR 1.1   Cardiac Enzymes: Recent Labs  Lab 08/03/20 1410  CKTOTAL 925*   BNP (last 3 results) No results for input(s): PROBNP in the last 8760 hours. HbA1C: No results for input(s): HGBA1C in the last 72 hours. CBG: No results for input(s): GLUCAP in the last 168 hours. Lipid Profile: No results for input(s): CHOL, HDL, LDLCALC, TRIG, CHOLHDL, LDLDIRECT in the last 72 hours. Thyroid Function Tests: No results for input(s): TSH, T4TOTAL, FREET4, T3FREE, THYROIDAB in the last 72 hours. Anemia Panel: Recent Labs    08/05/20 0249  VITAMINB12 2,482*  FOLATE 19.2  FERRITIN 260  TIBC 273  IRON 38*  RETICCTPCT 5.2*   Urine analysis:    Component Value Date/Time   COLORURINE YELLOW 08/06/2020 1314   APPEARANCEUR CLEAR 08/06/2020 1314   LABSPEC 1.019 08/06/2020 1314   PHURINE 5.0 08/06/2020 1314   GLUCOSEU NEGATIVE 08/06/2020 1314   HGBUR NEGATIVE 08/06/2020 1314   BILIRUBINUR NEGATIVE 08/06/2020 1314   KETONESUR NEGATIVE 08/06/2020 1314   PROTEINUR NEGATIVE 08/06/2020 1314   UROBILINOGEN 0.2 09/21/2014 1641   NITRITE NEGATIVE 08/06/2020 1314   LEUKOCYTESUR TRACE (A) 08/06/2020 1314   Sepsis Labs: @LABRCNTIP (procalcitonin:4,lacticidven:4)  ) Recent Results (from the past 240 hour(s))  Resp Panel by RT-PCR (Flu A&B, Covid) Nasopharyngeal Swab     Status: None   Collection Time: 08/03/20  2:56 PM   Specimen: Nasopharyngeal Swab; Nasopharyngeal(NP) swabs in vial transport medium  Result Value Ref Range Status   SARS Coronavirus 2 by RT PCR NEGATIVE NEGATIVE Final    Comment: (NOTE) SARS-CoV-2 target nucleic  acids are NOT DETECTED.  The SARS-CoV-2 RNA is generally detectable in upper respiratory specimens during the acute phase of infection. The lowest concentration of SARS-CoV-2 viral copies this assay can detect is 138 copies/mL. A negative result does not preclude SARS-Cov-2 infection and should not be used as the sole basis for treatment or other patient management decisions. A negative result may occur with  improper specimen collection/handling, submission of specimen other than nasopharyngeal swab, presence of viral mutation(s) within the areas targeted by this assay, and inadequate number of viral copies(<138 copies/mL). A negative result must be combined with clinical observations, patient history, and epidemiological information. The expected result is Negative.  Fact  Sheet for Patients:  BloggerCourse.com  Fact Sheet for Healthcare Providers:  SeriousBroker.it  This test is no t yet approved or cleared by the [place] FDA and  has been authorized for detection and/or diagnosis of SARS-CoV-2 by FDA under an Emergency Use Authorization (EUA). This EUA will remain  in effect (meaning this test can be used) for the duration of the COVID-19 declaration under Section 564(b)(1) of the Act, 21 U.S.C.section 360bbb-3(b)(1), unless the authorization is terminated  or revoked sooner.       Influenza A by PCR NEGATIVE NEGATIVE Final   Influenza B by PCR NEGATIVE NEGATIVE Final    Comment: (NOTE) The Xpert Xpress SARS-CoV-2/FLU/RSV plus assay is intended as an aid in the diagnosis of influenza from Nasopharyngeal swab specimens and should not be used as a sole basis for treatment. Nasal washings and aspirates are unacceptable for Xpert Xpress SARS-CoV-2/FLU/RSV testing.  Fact Sheet for Patients: BloggerCourse.com  Fact Sheet for Healthcare Providers: SeriousBroker.it  This  test is not yet approved or cleared by the [place] FDA and has been authorized for detection and/or diagnosis of SARS-CoV-2 by FDA under an Emergency Use Authorization (EUA). This EUA will remain in effect (meaning this test can be used) for the duration of the COVID-19 declaration under Section 564(b)(1) of the Act, 21 U.S.C. section 360bbb-3(b)(1), unless the authorization is terminated or revoked.  Performed at [facility], [address], [place], [place] [address]       Studies: DG Chest Port 1 View  Result Date: 08/06/2020 CLINICAL DATA:  Elevated white blood cell count today. EXAM: PORTABLE CHEST 1 VIEW COMPARISON:  August 03, 2020 FINDINGS: The heart size and mediastinal contours are within normal limits. Aortic atherosclerosis. No consolidation. Several small calcified granulomas in the right lung, similar to prior. No visible pleural effusions or pneumothorax. No acute osseous abnormality. Chronic elevated left hemidiaphragm. IMPRESSION: No active disease. Electronically Signed   By: Feliberto Harts MD   On: 08/06/2020 07:55    Scheduled Meds: . amLODipine  10 mg Oral q AM  . aspirin  81 mg Oral BID  . atorvastatin  10 mg Oral Daily  . cycloSPORINE  1 drop Both Eyes BID  . docusate sodium  100 mg Oral BID  . ferrous sulfate  325 mg Oral TID PC  . lisinopril  10 mg Oral Daily  . metoprolol tartrate  12.5 mg Oral BID  . polyethylene glycol  17 g Oral BID  . tamsulosin  0.4 mg Oral QHS    Continuous Infusions: . sodium chloride 100 mL/hr at 08/04/20 2313  . methocarbamol (ROBAXIN) IV       LOS: 3 days     Briant Cedar, MD [facility]  If 7PM-7AM, please contact night-coverage [email or web address] 08/06/2020, 2:09 PM

## 2020-08-06 NOTE — Progress Notes (Addendum)
Pt has no bed offers.   [name] is going to review.   CSW spoke with pt's duaghter, She is agreeable to expanding bed search to [place].   SNF's have asked about if pt has a legal case against the hotel where he fell. Sister clarified that she only made complaints with the hotel and that no legal action has taken place.

## 2020-08-07 DIAGNOSIS — S72001A Fracture of unspecified part of neck of right femur, initial encounter for closed fracture: Secondary | ICD-10-CM | POA: Diagnosis not present

## 2020-08-07 DIAGNOSIS — I10 Essential (primary) hypertension: Secondary | ICD-10-CM | POA: Diagnosis not present

## 2020-08-07 DIAGNOSIS — K7469 Other cirrhosis of liver: Secondary | ICD-10-CM | POA: Diagnosis not present

## 2020-08-07 DIAGNOSIS — Z8673 Personal history of transient ischemic attack (TIA), and cerebral infarction without residual deficits: Secondary | ICD-10-CM | POA: Diagnosis not present

## 2020-08-07 LAB — CBC WITH DIFFERENTIAL/PLATELET
Abs Immature Granulocytes: 0.31 10*3/uL — ABNORMAL HIGH (ref 0.00–0.07)
Basophils Absolute: 0 10*3/uL (ref 0.0–0.1)
Basophils Relative: 0 %
Eosinophils Absolute: 0.2 10*3/uL (ref 0.0–0.5)
Eosinophils Relative: 1 %
HCT: 26.3 % — ABNORMAL LOW (ref 39.0–52.0)
Hemoglobin: 8.3 g/dL — ABNORMAL LOW (ref 13.0–17.0)
Immature Granulocytes: 2 %
Lymphocytes Relative: 11 %
Lymphs Abs: 1.5 10*3/uL (ref 0.7–4.0)
MCH: 30.6 pg (ref 26.0–34.0)
MCHC: 31.6 g/dL (ref 30.0–36.0)
MCV: 97 fL (ref 80.0–100.0)
Monocytes Absolute: 0.8 10*3/uL (ref 0.1–1.0)
Monocytes Relative: 6 %
Neutro Abs: 10.8 10*3/uL — ABNORMAL HIGH (ref 1.7–7.7)
Neutrophils Relative %: 80 %
Platelets: 224 10*3/uL (ref 150–400)
RBC: 2.71 MIL/uL — ABNORMAL LOW (ref 4.22–5.81)
RDW: 16.6 % — ABNORMAL HIGH (ref 11.5–15.5)
WBC: 13.7 10*3/uL — ABNORMAL HIGH (ref 4.0–10.5)
nRBC: 0 % (ref 0.0–0.2)

## 2020-08-07 LAB — BASIC METABOLIC PANEL
Anion gap: 7 (ref 5–15)
BUN: 21 mg/dL (ref 8–23)
CO2: 30 mmol/L (ref 22–32)
Calcium: 8.2 mg/dL — ABNORMAL LOW (ref 8.9–10.3)
Chloride: 100 mmol/L (ref 98–111)
Creatinine, Ser: 1 mg/dL (ref 0.61–1.24)
GFR, Estimated: >60 mL/min (ref >60)
Glucose, Bld: 124 mg/dL — ABNORMAL HIGH (ref 70–99)
Potassium: 3.7 mmol/L (ref 3.5–5.1)
Sodium: 137 mmol/L (ref 135–145)

## 2020-08-07 NOTE — Progress Notes (Signed)
PROGRESS NOTE  [name] QHU:[id number] DOB: 1951-04-27 DOA: 08/03/2020 PCP: Corine Shelter, MD  HPI/Recap of past 24 hours: HPI from Dr Maryfrances Bunnell Mr. [name] is a 70 y.o. M with CVA, HTN, hep C cirrhosis who presented after a mechanical fall in a hotel landing on his R hip while visiting family in [place]. Shortly after fall, he was able to walk briefly with his walker. Patient delayed his care by waiting for 10 days before coming into the ER. Imaging showed R hip fracture. Patient admitted for further management. Orthopedics consulted, s/p ORIF on 08/04/2020. Currently awaiting SNF placement.     Today, patient denies any new complaints.     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) ([facility])   Comminuted right intertrochanteric hip fracture s/p ORIF on 08/04/2020 Imaging showed fracture with surrounding muscular injury and hematoma Pain management, DVT prophylaxis per Ortho (rec ASA 81 mg BID) PT/OT-recommend SNF  Acute blood loss anemia S/p 2 units of PRBC on 08/03/20 Daily CBC  Leukocytosis ??Likely 2/2 from 1 dose of Decadron given on 08/05/2020 UA negative, chest x-ray with no active disease Daily CBC  Hypertension BP stable Continue amlodipine, metoprolol, lisinopril Continue to hold chlorthalidone  History of CVA Continue Lipitor  History of hep C cirrhosis Currently compensated  BPH Continue Flomax       Malnutrition Type:      Malnutrition Characteristics:      Nutrition Interventions:       Estimated body mass index is 27.55 kg/m as calculated from the following:   Height as of this encounter: 5\' 7"  (1.702 m).   Weight as of this encounter: 79.8 kg.     Code Status: Full  Family Communication: None at bedside  Disposition Plan: Status  is: Inpatient  Remains inpatient appropriate because:Inpatient level of care appropriate due to severity of illness   Dispo: The patient is from: Home              Anticipated d/c is to: SNF              Anticipated d/c date is: 1 day              Patient currently is medically stable to d/c.   Consultants:  Orthopedics  Procedures: ORIF on 08/04/2020  Antimicrobials:  None  DVT prophylaxis: SCD, Ortho recommending ASA 81 mg twice daily for 4 weeks for [facility]   Objective: Vitals:   08/06/20 0822 08/06/20 1318 08/06/20 2204 08/07/20 0818  BP: 112/69 113/65 117/63 107/61  Pulse: 68 66 92 65  Resp: 18 18 18 18   Temp: 98.3 F (36.8 C) 98 F (36.7 C) 98.2 F (36.8 C) 98.6 F (37 C)  TempSrc: Oral Oral Oral Oral  SpO2: 99% 100% 99% 99%  Weight:      Height:        Intake/Output Summary (Last 24 hours) at 08/07/2020 1349 Last data filed at 08/07/2020 0949 Gross per 24 hour  Intake 480 ml  Output 950 ml  Net -470 ml   Filed Weights   08/03/20 1413  Weight: 79.8 kg    Exam:  General: NAD   Cardiovascular: S1, S2 present  Respiratory: CTAB  Abdomen: Soft, nontender, nondistended, bowel sounds present  Musculoskeletal: No bilateral pedal edema noted, R hip dressing with slight drainage, intact  Skin: Normal  Psychiatry: Normal mood    Data Reviewed: CBC: Recent Labs  Lab 08/03/20 1410 08/04/20 0553 08/05/20 0249 08/06/20 0137 08/07/20 0140  WBC 10.2 8.8 15.9* 18.4* 13.7*  NEUTROABS 7.5  --   --   --  10.8*  HGB 8.0* 8.3* 9.3* 8.1* 8.3*  HCT 25.8* 25.2* 27.5* 25.9* 26.3*  MCV 97.4 96.9 91.7 95.2 97.0  PLT 275 270 243 221 [id number]   Basic Metabolic Panel: Recent Labs  Lab 08/03/20 1410 08/04/20 0553 08/05/20 0249 08/06/20 0137 08/07/20 0140  NA 136 140 140 139 137  K 3.7 3.8 4.4 4.2 3.7  CL 98 100 104 100 100  CO2 26 27 27 29 30   GLUCOSE 114* 112* 130* 132* 124*  BUN 36* 23 17 22 21   CREATININE 1.25* 0.99 1.06 1.04 1.00  CALCIUM 8.9 8.8*  8.6* 8.1* 8.2*   GFR: Estimated Creatinine Clearance: 68.6 mL/min (by C-G formula based on SCr of 1 mg/dL). Liver Function Tests: Recent Labs  Lab 08/04/20 0553  AST 46*  ALT 43  ALKPHOS 58  BILITOT 2.2*  PROT 5.9*  ALBUMIN 2.8*   No results for input(s): LIPASE, AMYLASE in the last 168 hours. No results for input(s): AMMONIA in the last 168 hours. Coagulation Profile: Recent Labs  Lab 08/03/20 1410  INR 1.1   Cardiac Enzymes: Recent Labs  Lab 08/03/20 1410  CKTOTAL 925*   BNP (last 3 results) No results for input(s): PROBNP in the last 8760 hours. HbA1C: No results for input(s): HGBA1C in the last 72 hours. CBG: No results for input(s): GLUCAP in the last 168 hours. Lipid Profile: No results for input(s): CHOL, HDL, LDLCALC, TRIG, CHOLHDL, LDLDIRECT in the last 72 hours. Thyroid Function Tests: No results for input(s): TSH, T4TOTAL, FREET4, T3FREE, THYROIDAB in the last 72 hours. Anemia Panel: Recent Labs    08/05/20 0249  VITAMINB12 2,482*  FOLATE 19.2  FERRITIN 260  TIBC 273  IRON 38*  RETICCTPCT 5.2*   Urine analysis:    Component Value Date/Time   COLORURINE YELLOW 08/06/2020 1314   APPEARANCEUR CLEAR 08/06/2020 1314   LABSPEC 1.019 08/06/2020 1314   PHURINE 5.0 08/06/2020 1314   GLUCOSEU NEGATIVE 08/06/2020 1314   HGBUR NEGATIVE 08/06/2020 1314   BILIRUBINUR NEGATIVE 08/06/2020 1314   KETONESUR NEGATIVE 08/06/2020 1314   PROTEINUR NEGATIVE 08/06/2020 1314   UROBILINOGEN 0.2 09/21/2014 1641   NITRITE NEGATIVE 08/06/2020 1314   LEUKOCYTESUR TRACE (A) 08/06/2020 1314   Sepsis Labs: @LABRCNTIP (procalcitonin:4,lacticidven:4)  ) Recent Results (from the past 240 hour(s))  Resp Panel by RT-PCR (Flu A&B, Covid) Nasopharyngeal Swab     Status: None   Collection Time: 08/03/20  2:56 PM   Specimen: Nasopharyngeal Swab; Nasopharyngeal(NP) swabs in vial transport medium  Result Value Ref Range Status   SARS Coronavirus 2 by RT PCR NEGATIVE NEGATIVE  Final    Comment: (NOTE) SARS-CoV-2 target nucleic acids are NOT DETECTED.  The SARS-CoV-2 RNA is generally detectable in upper respiratory specimens during the acute phase of infection. The lowest concentration of SARS-CoV-2 viral copies this assay can detect is 138 copies/mL. A negative result does not preclude SARS-Cov-2 infection and should not be used as the sole basis for treatment or other patient management decisions. A negative result may occur with  improper specimen collection/handling, submission of specimen other than nasopharyngeal swab, presence of viral mutation(s) within the areas targeted by this assay, and inadequate number of viral copies(<138 copies/mL). A negative result must be combined with clinical  observations, patient history, and epidemiological information. The expected result is Negative.  Fact Sheet for Patients:  EntrepreneurPulse.com.au  Fact Sheet for Healthcare Providers:  IncredibleEmployment.be  This test is no t yet approved or cleared by the [place] FDA and  has been authorized for detection and/or diagnosis of SARS-CoV-2 by FDA under an Emergency Use Authorization (EUA). This EUA will remain  in effect (meaning this test can be used) for the duration of the COVID-19 declaration under Section 564(b)(1) of the Act, 21 U.S.C.section 360bbb-3(b)(1), unless the authorization is terminated  or revoked sooner.       Influenza A by PCR NEGATIVE NEGATIVE Final   Influenza B by PCR NEGATIVE NEGATIVE Final    Comment: (NOTE) The Xpert Xpress SARS-CoV-2/FLU/RSV plus assay is intended as an aid in the diagnosis of influenza from Nasopharyngeal swab specimens and should not be used as a sole basis for treatment. Nasal washings and aspirates are unacceptable for Xpert Xpress SARS-CoV-2/FLU/RSV testing.  Fact Sheet for Patients: EntrepreneurPulse.com.au  Fact Sheet for Healthcare  Providers: IncredibleEmployment.be  This test is not yet approved or cleared by the [place] FDA and has been authorized for detection and/or diagnosis of SARS-CoV-2 by FDA under an Emergency Use Authorization (EUA). This EUA will remain in effect (meaning this test can be used) for the duration of the COVID-19 declaration under Section 564(b)(1) of the Act, 21 U.S.C. section 360bbb-3(b)(1), unless the authorization is terminated or revoked.  Performed at [facility], [place] [address] [place], [place] [address]       Studies: No results found.  Scheduled Meds: . amLODipine  10 mg Oral q AM  . aspirin  81 mg Oral BID  . atorvastatin  10 mg Oral Daily  . cycloSPORINE  1 drop Both Eyes BID  . docusate sodium  100 mg Oral BID  . ferrous sulfate  325 mg Oral TID PC  . lisinopril  10 mg Oral Daily  . metoprolol tartrate  12.5 mg Oral BID  . polyethylene glycol  17 g Oral BID  . tamsulosin  0.4 mg Oral QHS    Continuous Infusions: . methocarbamol (ROBAXIN) IV       LOS: 4 days     Alma Friendly, MD [facility]  If 7PM-7AM, please contact night-coverage [email or web address] 08/07/2020, 1:49 PM

## 2020-08-07 NOTE — Plan of Care (Signed)
  Problem: Health Behavior/Discharge Planning: Goal: Ability to manage health-related needs will improve Outcome: Progressing   Problem: Clinical Measurements: Goal: Ability to maintain clinical measurements within normal limits will improve Outcome: Progressing Goal: Will remain free from infection Outcome: Progressing   Problem: Activity: Goal: Risk for activity intolerance will decrease Outcome: Progressing   Problem: Coping: Goal: Level of anxiety will decrease Outcome: Progressing   Problem: Elimination: Goal: Will not experience complications related to bowel motility Outcome: Progressing   Problem: Pain Managment: Goal: General experience of comfort will improve Outcome: Progressing   Problem: Safety: Goal: Ability to remain free from injury will improve Outcome: Progressing   Problem: Skin Integrity: Goal: Risk for impaired skin integrity will decrease Outcome: Progressing   

## 2020-08-08 ENCOUNTER — Encounter (HOSPITAL_COMMUNITY): Payer: Self-pay | Admitting: Orthopedic Surgery

## 2020-08-08 DIAGNOSIS — D649 Anemia, unspecified: Secondary | ICD-10-CM | POA: Diagnosis not present

## 2020-08-08 DIAGNOSIS — Z4889 Encounter for other specified surgical aftercare: Secondary | ICD-10-CM | POA: Diagnosis not present

## 2020-08-08 DIAGNOSIS — R52 Pain, unspecified: Secondary | ICD-10-CM | POA: Diagnosis not present

## 2020-08-08 DIAGNOSIS — Z741 Need for assistance with personal care: Secondary | ICD-10-CM | POA: Diagnosis not present

## 2020-08-08 DIAGNOSIS — M6281 Muscle weakness (generalized): Secondary | ICD-10-CM | POA: Diagnosis not present

## 2020-08-08 DIAGNOSIS — N390 Urinary tract infection, site not specified: Secondary | ICD-10-CM | POA: Diagnosis not present

## 2020-08-08 DIAGNOSIS — E785 Hyperlipidemia, unspecified: Secondary | ICD-10-CM

## 2020-08-08 DIAGNOSIS — Z9181 History of falling: Secondary | ICD-10-CM | POA: Diagnosis not present

## 2020-08-08 DIAGNOSIS — M2550 Pain in unspecified joint: Secondary | ICD-10-CM | POA: Diagnosis not present

## 2020-08-08 DIAGNOSIS — K7460 Unspecified cirrhosis of liver: Secondary | ICD-10-CM | POA: Diagnosis not present

## 2020-08-08 DIAGNOSIS — H409 Unspecified glaucoma: Secondary | ICD-10-CM | POA: Diagnosis not present

## 2020-08-08 DIAGNOSIS — R69 Illness, unspecified: Secondary | ICD-10-CM | POA: Diagnosis not present

## 2020-08-08 DIAGNOSIS — K7469 Other cirrhosis of liver: Secondary | ICD-10-CM | POA: Diagnosis not present

## 2020-08-08 DIAGNOSIS — Z8673 Personal history of transient ischemic attack (TIA), and cerebral infarction without residual deficits: Secondary | ICD-10-CM | POA: Diagnosis not present

## 2020-08-08 DIAGNOSIS — R262 Difficulty in walking, not elsewhere classified: Secondary | ICD-10-CM | POA: Diagnosis not present

## 2020-08-08 DIAGNOSIS — E039 Hypothyroidism, unspecified: Secondary | ICD-10-CM | POA: Diagnosis not present

## 2020-08-08 DIAGNOSIS — I10 Essential (primary) hypertension: Secondary | ICD-10-CM | POA: Diagnosis not present

## 2020-08-08 DIAGNOSIS — U071 COVID-19: Secondary | ICD-10-CM | POA: Diagnosis not present

## 2020-08-08 DIAGNOSIS — E559 Vitamin D deficiency, unspecified: Secondary | ICD-10-CM | POA: Diagnosis not present

## 2020-08-08 DIAGNOSIS — N400 Enlarged prostate without lower urinary tract symptoms: Secondary | ICD-10-CM | POA: Diagnosis not present

## 2020-08-08 DIAGNOSIS — I69351 Hemiplegia and hemiparesis following cerebral infarction affecting right dominant side: Secondary | ICD-10-CM | POA: Diagnosis not present

## 2020-08-08 DIAGNOSIS — S7291XD Unspecified fracture of right femur, subsequent encounter for closed fracture with routine healing: Secondary | ICD-10-CM | POA: Diagnosis not present

## 2020-08-08 DIAGNOSIS — R7309 Other abnormal glucose: Secondary | ICD-10-CM | POA: Diagnosis not present

## 2020-08-08 DIAGNOSIS — Z7401 Bed confinement status: Secondary | ICD-10-CM | POA: Diagnosis not present

## 2020-08-08 DIAGNOSIS — Z743 Need for continuous supervision: Secondary | ICD-10-CM | POA: Diagnosis not present

## 2020-08-08 DIAGNOSIS — I639 Cerebral infarction, unspecified: Secondary | ICD-10-CM | POA: Diagnosis not present

## 2020-08-08 DIAGNOSIS — S72001A Fracture of unspecified part of neck of right femur, initial encounter for closed fracture: Secondary | ICD-10-CM | POA: Diagnosis not present

## 2020-08-08 LAB — CBC WITH DIFFERENTIAL/PLATELET
Abs Immature Granulocytes: 0.25 10*3/uL — ABNORMAL HIGH (ref 0.00–0.07)
Basophils Absolute: 0 10*3/uL (ref 0.0–0.1)
Basophils Relative: 0 %
Eosinophils Absolute: 0.1 10*3/uL (ref 0.0–0.5)
Eosinophils Relative: 1 %
HCT: 25.6 % — ABNORMAL LOW (ref 39.0–52.0)
Hemoglobin: 8 g/dL — ABNORMAL LOW (ref 13.0–17.0)
Immature Granulocytes: 2 %
Lymphocytes Relative: 10 %
Lymphs Abs: 1.1 10*3/uL (ref 0.7–4.0)
MCH: 29.9 pg (ref 26.0–34.0)
MCHC: 31.3 g/dL (ref 30.0–36.0)
MCV: 95.5 fL (ref 80.0–100.0)
Monocytes Absolute: 0.6 10*3/uL (ref 0.1–1.0)
Monocytes Relative: 6 %
Neutro Abs: 8.9 10*3/uL — ABNORMAL HIGH (ref 1.7–7.7)
Neutrophils Relative %: 81 %
Platelets: 200 10*3/uL (ref 150–400)
RBC: 2.68 MIL/uL — ABNORMAL LOW (ref 4.22–5.81)
RDW: 16.1 % — ABNORMAL HIGH (ref 11.5–15.5)
WBC: 11 10*3/uL — ABNORMAL HIGH (ref 4.0–10.5)
nRBC: 0 % (ref 0.0–0.2)

## 2020-08-08 LAB — RESP PANEL BY RT-PCR (FLU A&B, COVID) ARPGX2
Influenza A by PCR: NEGATIVE
Influenza B by PCR: NEGATIVE
SARS Coronavirus 2 by RT PCR: NEGATIVE

## 2020-08-08 MED ORDER — HYDROCODONE-ACETAMINOPHEN 5-325 MG PO TABS: 1.0000 | ORAL_TABLET | Freq: Four times a day (QID) | ORAL | 0 refills | Status: AC | PRN

## 2020-08-08 MED ORDER — METOPROLOL TARTRATE 25 MG PO TABS: 12.5000 mg | ORAL_TABLET | Freq: Two times a day (BID) | ORAL | Status: AC

## 2020-08-08 MED ORDER — POLYETHYLENE GLYCOL 3350 17 G PO PACK: 17.0000 g | PACK | Freq: Two times a day (BID) | ORAL | 0 refills | Status: AC

## 2020-08-08 MED ORDER — DOCUSATE SODIUM 100 MG PO CAPS: 100.0000 mg | ORAL_CAPSULE | Freq: Two times a day (BID) | ORAL | 0 refills | Status: AC

## 2020-08-08 MED ORDER — FERROUS SULFATE 325 (65 FE) MG PO TABS
325.0000 mg | ORAL_TABLET | Freq: Two times a day (BID) | ORAL | 0 refills | Status: AC
Start: 2020-08-08 — End: ?

## 2020-08-08 NOTE — Care Management Important Message (Signed)
Important Message  Patient Details  Name: [name] MRN: [id number] Date of Birth: 11-Oct-1950   Medicare Important Message Given:  Yes     Barb Merino Erskine 08/08/2020, 12:24 PM

## 2020-08-08 NOTE — Progress Notes (Signed)
Called facility and gave report to Bethena Roys, all questions and concerns addressed, Pt not in distress, discharged to facility via [place].

## 2020-08-08 NOTE — Progress Notes (Signed)
Physical Therapy Treatment (2nd session) Patient Details Name: [name] MRN: [id number] DOB: [birth year] Today's Date: 08/08/2020    History of Present Illness 70 yo male with a fall in [place] on a trip waited to come to [place], now has ORIF R femur with 50% WB permitted from R intertrochanteric comminuted fracture and had IM nailing.  Pt in 8/10 pain, unable to stand up fully.  PMHx:  CVA with R hemiparesis, HTN, HLD, Hep C, cirrhosis, falls, glaucoma, BPH, cataracts, colon polyps    PT Comments    PTA called back into room to assist pt with return from chair to transport stretcher. Pt agreeable to therapy session and with good participation, reporting slight improvement of pain after muscle relaxer given by RN shortly before session. Pt performed sit<>stand from chair<>Stedy with +86maxA from chair height and +35modA to stand from higher Stedy seat prior to sitting down on stretcher. Pt needing increased +40max to [place] for bed mobility due to lack of rails on stretcher. Pt given ice for R hip/knee at end of session for comfort. Pt continues to benefit from PT services to progress toward functional mobility goals.  Follow Up Recommendations  SNF     Equipment Recommendations  Other (comment) (defer to next location)    Recommendations for Other Services       Precautions / Restrictions Precautions Precautions: Fall Precaution Comments: R hemiparesis Restrictions Weight Bearing Restrictions: Yes RLE Weight Bearing: Partial weight bearing RLE Partial Weight Bearing Percentage or Pounds: 50    Mobility  Bed Mobility Overal bed mobility: Needs Assistance Bed Mobility: Sit to Supine       Sit to supine: Max assist;+2 for physical assistance   General bed mobility comments: from EOB to supine on transport stretcher, increased assist needed due to lack of rails and trunk/BLE assist needed for safety  Transfers Overall transfer level: Needs assistance   Transfers: Sit to/from  Stand;Stand Pivot Transfers Sit to Stand: Max assist;+2 physical assistance;+2 safety/equipment Stand pivot transfers: Total assist       General transfer comment: from recliner chair to [facility] with +30maxA, then modA of 2 to stand from [place] seat prior to sitting; totalA via Stedy to pivot from chair to Press photographer  Ambulation/Gait                 Stairs             Wheelchair Mobility    Modified Rankin (Stroke Patients Only)       Balance Overall balance assessment: Needs assistance Sitting-balance support: Bilateral upper extremity supported;Feet supported Sitting balance-Leahy Scale: Poor Sitting balance - Comments: modA for seated balance on transport stretcher Postural control: Left lateral lean;Posterior lean Standing balance support: Bilateral upper extremity supported Standing balance-Leahy Scale: Zero Standing balance comment: +58maxA for static standing; unable to weight shift in stance due to R hip pain                            Cognition Arousal/Alertness: Awake/alert Behavior During Therapy: Flat affect Overall Cognitive Status: No family/caregiver present to determine baseline cognitive functioning                                 General Comments: oriented, cooperative; fearful of mobility due to pain/fatigue      Exercises      General Comments General comments (skin integrity, edema,  etc.): dressing on R hip clean/dry/intact      Pertinent Vitals/Pain Pain Assessment: 0-10 Pain Score: 7  Pain Location: R hip Pain Descriptors / Indicators: Operative site guarding;Grimacing;Guarding Pain Intervention(s): Monitored during session;Repositioned;Premedicated before session;Patient requesting pain meds-RN notified;Ice applied    Home Living                      Prior Function            PT Goals (current goals can now be found in the care plan section) Acute Rehab PT Goals Patient Stated  Goal: to get stronger and not hurt PT Goal Formulation: With patient Time For Goal Achievement: 08/19/20 Potential to Achieve Goals: Good Progress towards PT goals: Progressing toward goals    Frequency    Min 3X/week      PT Plan Current plan remains appropriate    Co-evaluation              AM-PAC PT "6 Clicks" Mobility   Outcome Measure  Help needed turning from your back to your side while in a flat bed without using bedrails?: A Lot Help needed moving from lying on your back to sitting on the side of a flat bed without using bedrails?: A Lot Help needed moving to and from a bed to a chair (including a wheelchair)?: Total Help needed standing up from a chair using your arms (e.g., wheelchair or bedside chair)?: A Lot Help needed to walk in hospital room?: Total Help needed climbing 3-5 steps with a railing? : Total 6 Click Score: 9    End of Session Equipment Utilized During Treatment: Gait belt Activity Tolerance: Patient tolerated treatment well Patient left: in bed (on transport stretcher) Nurse Communication: Mobility status;Other (comment);Weight bearing status (needs +2 with Stedy) PT Visit Diagnosis: Muscle weakness (generalized) (M62.81);Difficulty in walking, not elsewhere classified (R26.2);Pain;Hemiplegia and hemiparesis;History of falling (Z91.81) Hemiplegia - Right/Left: Right Hemiplegia - dominant/non-dominant: Dominant Hemiplegia - caused by: Unspecified (hx R hemiplegia s/p stroke in 2015) Pain - Right/Left: Right Pain - part of body: Hip     Time: 5427-0623 PT Time Calculation (min) (ACUTE ONLY): 11 min  Charges:  $Therapeutic Activity: 8-22 mins                     Crisanto Nied P., PTA Acute Rehabilitation Services Pager: [phone] Office: [place] 08/08/2020, 4:31 PM

## 2020-08-08 NOTE — Discharge Summary (Signed)
Discharge Summary  [name] [id number] DOB: 11-02-1950  PCP: Vincente Liberty, MD  Admit date: 08/03/2020 Discharge date: 08/08/2020  Time spent: 40 mins  Recommendations for Outpatient Follow-up:  1. PCP in 1 week 2. Orthopedics as scheduled    Discharge Diagnoses:  Active Hospital Problems   Diagnosis Date Noted  . Closed right hip fracture, initial encounter ([place]) 08/03/2020  . History of stroke 08/03/2020  . 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 Dyslipidemia 09/22/2014  . Essential hypertension     Resolved Hospital Problems  No resolved problems to display.    Discharge Condition: Stable  Diet recommendation: Heart healthy diet  Vitals:   08/08/20 0400 08/08/20 0742  BP: 113/66 (!) 128/59  Pulse: 89 80  Resp: 16 18  Temp: 99.3 F (37.4 C) 98.4 F (36.9 C)  SpO2: 99% 98%    History of present illness:  [name] is a70 y.o.M with CVA, HTN, hep C cirrhosis who presented after a mechanical fall in a hotel landing on his R hip while visiting family in [place]. Shortly after fall, he was able to walk briefly with his walker. Patient delayed his care by waiting for 10 days before coming into the ER. Imaging showed R hip fracture. Patient admitted for further management. Orthopedics consulted, s/p ORIF on 08/04/2020. Currently awaiting SNF placement.    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) ([place])   Comminuted right intertrochanteric hip fracture s/p ORIF on 08/04/2020 Imaging showed fracture with surrounding muscular injury and hematoma DVT prophylaxis -->(Orthopedics rec ASA 81 mg BID) PT/OT-recommend SNF Pain  management Follow-up with orthopedics as scheduled  Acute blood loss anemia S/p 2 units of PRBC on 08/03/20 Hemoglobin remained stable Follow-up with PCP with repeat labs  Leukocytosis Downtrending ??Likely 2/2 from 1 dose of Decadron given on 08/05/2020 UA negative, chest x-ray with no active disease Follow-up with PCP with repeat labs  Hypertension BP stable Continue amlodipine, metoprolol, lisinopril, chlorthalidone  History of CVA Continue Lipitor  History of hep C cirrhosis Currently compensated  BPH Continue Flomax   Estimated body mass index is 27.55 kg/m as calculated from the following:   Height as of this encounter: 5\' 7"  (1.702 m).   Weight as of this encounter: 79.8 kg.    Procedures: ORIF on 08/04/2020  Consultations:  Orthopedics    Discharge Exam: BP (!) 128/59 (BP Location: Right Arm)   Pulse 80   Temp 98.4 F (36.9 C) (Oral)   Resp 18   Ht 5\' 7"  (1.702 m)   Wt 79.8 kg   SpO2 98%   BMI 27.55 kg/m   General: NAD Cardiovascular: S1, S2 present Respiratory: CTA B    Discharge Instructions You were cared for by a hospitalist during your hospital stay. If you have any questions about your discharge medications or the care you received while you were in the hospital after you are discharged, you can call the unit and asked to speak with the hospitalist on call if the hospitalist that took care of you is not available. Once you are discharged, your primary care physician will handle any further medical issues. Please note that NO REFILLS for any  discharge medications will be authorized once you are discharged, as it is imperative that you return to your primary care physician (or establish a relationship with a primary care physician if you do not have one) for your aftercare needs so that they can reassess your need for medications and monitor your lab values.  Discharge Instructions    Diet - low sodium heart healthy   Complete by: As  directed    Discharge wound care:   Complete by: As directed    Reinforce dressing until seen by orthopedics   Increase activity slowly   Complete by: As directed    Partial weight bearing   Complete by: As directed    6-8 weeks   % Body Weight: 50   Laterality: right   Extremity: Lower     Allergies as of 08/08/2020   No Known Allergies     Medication List    STOP taking these medications   Bayer Aspirin 325 MG EC tablet Generic drug: aspirin Replaced by: aspirin 81 MG chewable tablet     TAKE these medications   acetaminophen 500 MG tablet Commonly known as: TYLENOL Take 500-1,000 mg by mouth 2 (two) times daily as needed for mild pain or headache.   amLODipine 10 MG tablet Commonly known as: NORVASC Take 10 mg by mouth in the morning.   aspirin 81 MG chewable tablet Commonly known as: Aspirin Childrens Chew 1 tablet (81 mg total) by mouth 2 (two) times daily. Take for 4 weeks, then resume regular dose. Replaces: Bayer Aspirin 325 MG EC tablet   atorvastatin 10 MG tablet Commonly known as: LIPITOR Take 10 mg by mouth every evening.   chlorthalidone 25 MG tablet Commonly known as: HYGROTON Take 12-25 mg by mouth See admin instructions. Take 12.5 mg by mouth in the morning if Systolic number is [id number] and 25 mg if 130 or greater   docusate sodium 100 MG capsule Commonly known as: COLACE Take 1 capsule (100 mg total) by mouth 2 (two) times daily.   ferrous sulfate 325 (65 FE) MG tablet Take 1 tablet (325 mg total) by mouth 2 (two) times daily with a meal.   HYDROcodone-acetaminophen 5-325 MG tablet Commonly known as: Norco Take 1 tablet by mouth every 6 (six) hours as needed for up to 7 days for moderate pain or severe pain.   lisinopril 10 MG tablet Commonly known as: ZESTRIL Take 10 mg by mouth daily.   methocarbamol 500 MG tablet Commonly known as: Robaxin Take 1 tablet (500 mg total) by mouth every 6 (six) hours as needed for muscle spasms.    metoprolol tartrate 25 MG tablet Commonly known as: LOPRESSOR Take 0.5 tablets (12.5 mg total) by mouth 2 (two) times daily. What changed:   medication strength  how much to take  Another medication with the same name was removed. Continue taking this medication, and follow the directions you see here.   One-A-Day Mens 50+ Advantage Tabs Take 1 tablet by mouth daily with breakfast.   polyethylene glycol 17 g packet Commonly known as: MIRALAX / GLYCOLAX Take 17 g by mouth 2 (two) times daily.   PRESCRIPTION MEDICATION Take 10 mEq by mouth See admin instructions. Potassium Chloride Extended-Release 10 mEq TABLETS (generic Micro-K) Take 1 tablet by mouth once a day   Restasis 0.05 % ophthalmic emulsion Generic drug: cycloSPORINE Place 1 drop into both eyes in the morning and at bedtime.   tamsulosin 0.4 MG Caps capsule Commonly known as: FLOMAX  Take 0.4 mg by mouth at bedtime.   TURMERIC PO Take 1 capsule by mouth 2 (two) times daily.   Voltaren 1 % Gel Generic drug: diclofenac Sodium Apply 2-4 g topically 3 (three) times daily as needed (for knee pain).            Discharge Care Instructions  (From admission, onward)         Start     Ordered   08/08/20 0000  Discharge wound care:       Comments: Reinforce dressing until seen by orthopedics   08/08/20 1007   08/05/20 0000  Partial weight bearing       Comments: 6-8 weeks  Question Answer Comment  % Body Weight 50   Laterality right   Extremity Lower      08/05/20 0733         No Known Allergies  Follow-up Information    Paralee Cancel, MD Follow up in 2 week(s).   Specialty: Orthopedic Surgery Contact information: [address] [place] [address] [phone]        Vincente Liberty, MD. Schedule an appointment as soon as possible for a visit in 1 week(s).   Specialty: Pulmonary Disease Contact information: [place] [address] [phone]                 The results of significant diagnostics from this hospitalization (including imaging, microbiology, ancillary and laboratory) are listed below for reference.    Significant Diagnostic Studies: DG Pelvis 1-2 Views  Result Date: 08/03/2020 CLINICAL DATA:  Status post fall with right hip pain. EXAM: PELVIS - 1-2 VIEW COMPARISON:  None. FINDINGS: Comminuted displaced fracture through the intertrochanteric proximal right femur is identified. No other acute fracture or dislocation is noted. IMPRESSION: Comminuted displaced fracture through the intertrochanteric proximal right femur. Electronically Signed   By: Abelardo Diesel M.D.   On: 08/03/2020 14:44   DG Pelvis Portable  Result Date: 08/04/2020 CLINICAL DATA:  70 year old male status post ORIF of the right femur. EXAM: PORTABLE PELVIS 1-2 VIEWS COMPARISON:  Pelvic radiograph dated 08/03/2020. FINDINGS: Status post ORIF the right femoral neck fracture. The hardware appears intact. No dislocation. Postsurgical changes and soft tissue air in the right hip. IMPRESSION: Status post ORIF of the right femoral neck fracture. No dislocation. Electronically Signed   By: Anner Crete M.D.   On: 08/04/2020 18:39   CT Hip Right Wo Contrast  Result Date: 08/03/2020 CLINICAL DATA:  [place] 07/24/2020. Persistent hip pain. Unable to walk. EXAM: CT OF THE RIGHT HIP WITHOUT CONTRAST TECHNIQUE: Multidetector CT imaging of the right hip was performed according to the standard protocol. Multiplanar CT image reconstructions were also generated. COMPARISON:  Radiographs, same date. FINDINGS: There is a comminuted and displaced intertrochanteric fracture of the right hip which appears subacute. Early changes of heterotopic ossification are noted and there are bony resorptive changes at the fractures. The lesser trochanter is fractured and displaced. The femoral head is normally located in the acetabulum. No acetabular fracture. The pubic symphysis and right SI  joints are intact. No right-sided pelvic fractures. Moderate hematoma surrounding the fracture along with probable muscle injuries. Extensive vascular calcifications are noted. IMPRESSION: 1. Subacute appearing comminuted and displaced intertrochanteric fracture of the right hip. Early changes of heterotopic ossification and bony resorptive changes at the fracture sites. 2. Moderate hematoma surrounding the fracture along with probable muscle injuries. 3. No right-sided pelvic fractures. Electronically Signed   By: Mamie Nick.  Gallerani M.D.   On: 08/03/2020 16:43   DG Chest Port 1 View  Result Date: 08/06/2020 CLINICAL DATA:  Elevated white blood cell count today. EXAM: PORTABLE CHEST 1 VIEW COMPARISON:  August 03, 2020 FINDINGS: The heart size and mediastinal contours are within normal limits. Aortic atherosclerosis. No consolidation. Several small calcified granulomas in the right lung, similar to prior. No visible pleural effusions or pneumothorax. No acute osseous abnormality. Chronic elevated left hemidiaphragm. IMPRESSION: No active disease. Electronically Signed   By: Margaretha Sheffield MD   On: 08/06/2020 07:55   DG CHEST PORT 1 VIEW  Result Date: 08/03/2020 CLINICAL DATA:  70 year old male with history of right femur fracture. Preoperative chest x-ray. EXAM: PORTABLE CHEST 1 VIEW COMPARISON:  Chest x-ray 04/09/2020. FINDINGS: Lung volumes are normal. Mild elevation of the left hemidiaphragm (chronic). No consolidative airspace disease. No pleural effusions. No pneumothorax. Several tiny calcified granulomas are noted in the right lung. No other suspicious appearing pulmonary nodule or mass noted. Pulmonary vasculature and the cardiomediastinal silhouette are within normal limits. Atherosclerosis in the thoracic aorta. IMPRESSION: 1. No radiographic evidence of acute cardiopulmonary disease. 2. Aortic atherosclerosis. Electronically Signed   By: Vinnie Langton M.D.   On: 08/03/2020 19:05   DG C-Arm 1-60  Min  Result Date: 08/04/2020 CLINICAL DATA:  70 year old male status post ORIF of the right femoral neck fracture. EXAM: DG C-ARM 1-60 MIN; RIGHT FEMUR 2 VIEWS COMPARISON:  Right hip radiograph dated 08/03/2020. FLUOROSCOPY TIME:  Two intraoperative fluoroscopic spot images provided. The total fluoroscopic time is 58 seconds. Status post ORIF of the femoral neck fracture. FINDINGS: ORIF of the right femoral neck fracture. Electronically Signed   By: Anner Crete M.D.   On: 08/04/2020 17:44   DG FEMUR, MIN 2 VIEWS RIGHT  Result Date: 08/04/2020 CLINICAL DATA:  70 year old male status post ORIF of the right femoral neck fracture. EXAM: DG C-ARM 1-60 MIN; RIGHT FEMUR 2 VIEWS COMPARISON:  Right hip radiograph dated 08/03/2020. FLUOROSCOPY TIME:  Two intraoperative fluoroscopic spot images provided. The total fluoroscopic time is 58 seconds. Status post ORIF of the femoral neck fracture. FINDINGS: ORIF of the right femoral neck fracture. Electronically Signed   By: Anner Crete M.D.   On: 08/04/2020 17:44   DG Femur Min 2 Views Right  Result Date: 08/03/2020 CLINICAL DATA:  Status post fall with right hip pain. EXAM: RIGHT FEMUR 2 VIEWS COMPARISON:  None. FINDINGS: Comminuted displaced intertrochanteric fracture through the right proximal femur is noted. IMPRESSION: Comminuted displaced intertrochanteric fracture through the right proximal femur. Electronically Signed   By: Abelardo Diesel M.D.   On: 08/03/2020 14:43    Microbiology: Recent Results (from the past 240 hour(s))  Resp Panel by RT-PCR (Flu A&B, Covid) Nasopharyngeal Swab     Status: None   Collection Time: 08/03/20  2:56 PM   Specimen: Nasopharyngeal Swab; Nasopharyngeal(NP) swabs in vial transport medium  Result Value Ref Range Status   SARS Coronavirus 2 by RT PCR NEGATIVE NEGATIVE Final    Comment: (NOTE) SARS-CoV-2 target nucleic acids are NOT DETECTED.  The SARS-CoV-2 RNA is generally detectable in upper respiratory specimens  during the acute phase of infection. The lowest concentration of SARS-CoV-2 viral copies this assay can detect is 138 copies/mL. A negative result does not preclude SARS-Cov-2 infection and should not be used as the sole basis for treatment or other patient management decisions. A negative result may occur with  improper specimen collection/handling, submission of specimen other than nasopharyngeal swab,  presence of viral mutation(s) within the areas targeted by this assay, and inadequate number of viral copies(<138 copies/mL). A negative result must be combined with clinical observations, patient history, and epidemiological information. The expected result is Negative.  Fact Sheet for Patients:  EntrepreneurPulse.com.au  Fact Sheet for Healthcare Providers:  IncredibleEmployment.be  This test is no t yet approved or cleared by the [place] FDA and  has been authorized for detection and/or diagnosis of SARS-CoV-2 by FDA under an Emergency Use Authorization (EUA). This EUA will remain  in effect (meaning this test can be used) for the duration of the COVID-19 declaration under Section 564(b)(1) of the Act, 21 U.S.C.section 360bbb-3(b)(1), unless the authorization is terminated  or revoked sooner.       Influenza A by PCR NEGATIVE NEGATIVE Final   Influenza B by PCR NEGATIVE NEGATIVE Final    Comment: (NOTE) The Xpert Xpress SARS-CoV-2/FLU/RSV plus assay is intended as an aid in the diagnosis of influenza from Nasopharyngeal swab specimens and should not be used as a sole basis for treatment. Nasal washings and aspirates are unacceptable for Xpert Xpress SARS-CoV-2/FLU/RSV testing.  Fact Sheet for Patients: EntrepreneurPulse.com.au  Fact Sheet for Healthcare Providers: IncredibleEmployment.be  This test is not yet approved or cleared by the [place] FDA and has been authorized for detection  and/or diagnosis of SARS-CoV-2 by FDA under an Emergency Use Authorization (EUA). This EUA will remain in effect (meaning this test can be used) for the duration of the COVID-19 declaration under Section 564(b)(1) of the Act, 21 U.S.C. section 360bbb-3(b)(1), unless the authorization is terminated or revoked.  Performed at [facility], [place] [address] [place], [place] [address]      Labs: Basic Metabolic Panel: Recent Labs  Lab 08/03/20 1410 08/04/20 0553 08/05/20 0249 08/06/20 0137 08/07/20 0140  NA 136 140 140 139 137  K 3.7 3.8 4.4 4.2 3.7  CL 98 100 104 100 100  CO2 26 27 27 29 30   GLUCOSE 114* 112* 130* 132* 124*  BUN 36* 23 17 22 21   CREATININE 1.25* 0.99 1.06 1.04 1.00  CALCIUM 8.9 8.8* 8.6* 8.1* 8.2*   Liver Function Tests: Recent Labs  Lab 08/04/20 0553  AST 46*  ALT 43  ALKPHOS 58  BILITOT 2.2*  PROT 5.9*  ALBUMIN 2.8*   No results for input(s): LIPASE, AMYLASE in the last 168 hours. No results for input(s): AMMONIA in the last 168 hours. CBC: Recent Labs  Lab 08/03/20 1410 08/04/20 0553 08/05/20 0249 08/06/20 0137 08/07/20 0140 08/08/20 0147  WBC 10.2 8.8 15.9* 18.4* 13.7* 11.0*  NEUTROABS 7.5  --   --   --  10.8* 8.9*  HGB 8.0* 8.3* 9.3* 8.1* 8.3* 8.0*  HCT 25.8* 25.2* 27.5* 25.9* 26.3* 25.6*  MCV 97.4 96.9 91.7 95.2 97.0 95.5  PLT 275 270 243 221 224 200   Cardiac Enzymes: Recent Labs  Lab 08/03/20 1410  CKTOTAL 925*   BNP: BNP (last 3 results) No results for input(s): BNP in the last 8760 hours.  ProBNP (last 3 results) No results for input(s): PROBNP in the last 8760 hours.  CBG: No results for input(s): GLUCAP in the last 168 hours.     Signed:  Alma Friendly, MD [facility] 08/08/2020, 10:08 AM

## 2020-08-08 NOTE — Plan of Care (Signed)

## 2020-08-08 NOTE — Plan of Care (Signed)

## 2020-08-08 NOTE — Progress Notes (Signed)
Physical Therapy Treatment Patient Details Name: [name] MRN: [id number] DOB: [birth year] Today's Date: 08/08/2020    History of Present Illness 70 yo male with a fall in [place] on a trip waited to come to hosp, now has ORIF R femur with 50% WB permitted from R intertrochanteric comminuted fracture and had IM nailing.  Pt in 8/10 pain, unable to stand up fully.  PMHx:  CVA with R hemiparesis, HTN, HLD, Hep C, cirrhosis, falls, glaucoma, BPH, cataracts, colon polyps    PT Comments    Pt supine on arrival, agreeable to therapy session with encouragement and with good participation and fair tolerance for mobility. Pt mainly limited due to R hip pain and generalized deconditioning, but able to progress to standing transfers this session with +2 assist and use of Stedy. Pt totalA for pivot transfer to chair. Pt performed bed mobility with +100maxA, needing dense cues and increased time/encouragement to perform. Reviewed supine/seated RLE therapeutic exercises and pt needing A/AAROM to perform, unable to reach terminal R knee extension 2/2 pain and given ice packs for knee/hip at end of session for pain/edema reduction. Pt continues to benefit from skilled rehab in a post acute setting to maximize functional gains before returning home.   Follow Up Recommendations  SNF     Equipment Recommendations  Other (comment) (defer to next location)    Recommendations for Other Services       Precautions / Restrictions Precautions Precautions: Fall Precaution Comments: R hemiparesis Restrictions Weight Bearing Restrictions: Yes RLE Weight Bearing: Partial weight bearing RLE Partial Weight Bearing Percentage or Pounds: 50    Mobility  Bed Mobility Overal bed mobility: Needs Assistance Bed Mobility: Supine to Sit;Sit to Supine     Supine to sit: Max assist;+2 for physical assistance     General bed mobility comments: use of bed rail, max cues for sequencing, pt able to assist with BLE  mobility toward EOB but needs maxA for trunk rise/lowering  Transfers Overall transfer level: Needs assistance   Transfers: Sit to/from Stand;Stand Pivot Transfers Sit to Stand: Max assist;+2 physical assistance;+2 safety/equipment;From elevated surface Stand pivot transfers: Total assist       General transfer comment: from elevated bed height to [facility] with +50maxA, then mod/maxA of 2 to stand from [place] seat prior to sitting; totalA via Stedy to pivot to chair  Ambulation/Gait                 Stairs             Wheelchair Mobility    Modified Rankin (Stroke Patients Only)       Balance Overall balance assessment: Needs assistance Sitting-balance support: Bilateral upper extremity supported;Feet supported Sitting balance-Leahy Scale: Fair Sitting balance - Comments: minA initially progressing to close Supervision; posterior lean and L lean due to R hip pain Postural control: Left lateral lean;Posterior lean Standing balance support: Bilateral upper extremity supported Standing balance-Leahy Scale: Zero Standing balance comment: +82maxA for static standing; unable to weight shift in stance due to R hip pain                            Cognition Arousal/Alertness: Awake/alert Behavior During Therapy: Flat affect Overall Cognitive Status: No family/caregiver present to determine baseline cognitive functioning  General Comments: oriented, cooperative; fearful of mobility due to pain/fatigue      Exercises General Exercises - Lower Extremity Ankle Circles/Pumps: AROM;Both;10 reps;Supine Quad Sets: AROM;Strengthening;Both;Supine;5 reps Long Arc Quad: AAROM;Strengthening;Right;10 reps;Seated    General Comments General comments (skin integrity, edema, etc.): dressing on R hip clean/dry/intact      Pertinent Vitals/Pain Pain Assessment: 0-10 Pain Score: 7  Pain Location: R hip Pain Descriptors /  Indicators: Operative site guarding;Grimacing;Guarding Pain Intervention(s): Monitored during session;Repositioned;Premedicated before session;Patient requesting pain meds-RN notified;Ice applied    Home Living                      Prior Function            PT Goals (current goals can now be found in the care plan section) Acute Rehab PT Goals Patient Stated Goal: to get stronger and not hurt PT Goal Formulation: With patient Time For Goal Achievement: 08/19/20 Potential to Achieve Goals: Good Progress towards PT goals: Progressing toward goals    Frequency    Min 3X/week      PT Plan Current plan remains appropriate    Co-evaluation              AM-PAC PT "6 Clicks" Mobility   Outcome Measure  Help needed turning from your back to your side while in a flat bed without using bedrails?: A Lot Help needed moving from lying on your back to sitting on the side of a flat bed without using bedrails?: A Lot Help needed moving to and from a bed to a chair (including a wheelchair)?: Total Help needed standing up from a chair using your arms (e.g., wheelchair or bedside chair)?: A Lot Help needed to walk in hospital room?: Total Help needed climbing 3-5 steps with a railing? : Total 6 Click Score: 9    End of Session Equipment Utilized During Treatment: Gait belt Activity Tolerance: No increased pain;Patient tolerated treatment well Patient left: with call bell/phone within reach;in chair Nurse Communication: Mobility status;Other (comment);Weight bearing status (needs +2 with Stedy) PT Visit Diagnosis: Muscle weakness (generalized) (M62.81);Difficulty in walking, not elsewhere classified (R26.2);Pain;Hemiplegia and hemiparesis;History of falling (Z91.81) Hemiplegia - Right/Left: Right Hemiplegia - dominant/non-dominant: Dominant Hemiplegia - caused by: Unspecified (hx R hemiplegia s/p stroke in 2015) Pain - Right/Left: Right Pain - part of body: Hip      Time: 1440-1459 PT Time Calculation (min) (ACUTE ONLY): 19 min  Charges:  $Therapeutic Activity: 8-22 mins                     Jakyren Fluegge P., PTA Acute Rehabilitation Services Pager: [phone] Office: [place] 08/08/2020, 4:09 PM

## 2020-08-08 NOTE — TOC Transition Note (Signed)
Transition of Care [facility]) - CM/SW Discharge Note   Patient Details  Name: [name] MRN: [id number] Date of Birth: Jan 26, 1951  Transition of Care [facility]) CM/SW Contact:  Bethann Berkshire, [place] Phone Number: 08/08/2020, 12:58 PM   Clinical Narrative:     Patient will DC to: [facility] Anticipated DC date: 08/08/20 Family notified: [facility] Transport by: Corey Harold   Per MD patient ready for DC to [facility]. RN, patient, patient's family, and facility notified of DC. Discharge Summary and FL2 sent to facility. RN to call report prior to discharge [phone] Room 103). DC packet on chart. Ambulance transport requested for patient.   CSW will sign off for now as social work intervention is no longer needed. Please consult [place] again if new needs arise.   Final next level of care: Skilled Nursing Facility Barriers to Discharge: No Barriers Identified   Patient Goals and CMS Choice Patient states their goals for this hospitalization and ongoing recovery are:: To get better CMS Medicare.gov Compare Post Acute Care list provided to:: Patient Choice offered to / list presented to : Patient  Discharge Placement              Patient chooses bed at:  [facility]) Patient to be transferred to facility by: [place] Name of family member notified: [name] Patient and family notified of of transfer: 08/08/20  Discharge Plan and Services                                     Social Determinants of Health (SDOH) Interventions     Readmission Risk Interventions No flowsheet data found.

## 2020-08-11 DIAGNOSIS — R69 Illness, unspecified: Secondary | ICD-10-CM | POA: Diagnosis not present

## 2020-08-11 DIAGNOSIS — R7309 Other abnormal glucose: Secondary | ICD-10-CM | POA: Diagnosis not present

## 2020-08-11 DIAGNOSIS — E039 Hypothyroidism, unspecified: Secondary | ICD-10-CM | POA: Diagnosis not present

## 2020-08-11 DIAGNOSIS — K7460 Unspecified cirrhosis of liver: Secondary | ICD-10-CM | POA: Diagnosis not present

## 2020-08-11 DIAGNOSIS — N390 Urinary tract infection, site not specified: Secondary | ICD-10-CM | POA: Diagnosis not present

## 2020-08-11 DIAGNOSIS — E559 Vitamin D deficiency, unspecified: Secondary | ICD-10-CM | POA: Diagnosis not present

## 2020-08-11 DIAGNOSIS — E785 Hyperlipidemia, unspecified: Secondary | ICD-10-CM | POA: Diagnosis not present

## 2020-08-11 DIAGNOSIS — I10 Essential (primary) hypertension: Secondary | ICD-10-CM | POA: Diagnosis not present

## 2020-08-12 DIAGNOSIS — E785 Hyperlipidemia, unspecified: Secondary | ICD-10-CM | POA: Diagnosis not present

## 2020-08-12 DIAGNOSIS — N400 Enlarged prostate without lower urinary tract symptoms: Secondary | ICD-10-CM | POA: Diagnosis not present

## 2020-08-12 DIAGNOSIS — I10 Essential (primary) hypertension: Secondary | ICD-10-CM | POA: Diagnosis not present

## 2020-08-12 DIAGNOSIS — I69351 Hemiplegia and hemiparesis following cerebral infarction affecting right dominant side: Secondary | ICD-10-CM | POA: Diagnosis not present

## 2020-08-12 DIAGNOSIS — R69 Illness, unspecified: Secondary | ICD-10-CM | POA: Diagnosis not present

## 2020-08-12 DIAGNOSIS — H409 Unspecified glaucoma: Secondary | ICD-10-CM | POA: Diagnosis not present

## 2020-08-12 DIAGNOSIS — D649 Anemia, unspecified: Secondary | ICD-10-CM | POA: Diagnosis not present

## 2020-08-18 DIAGNOSIS — I639 Cerebral infarction, unspecified: Secondary | ICD-10-CM | POA: Diagnosis not present

## 2020-08-18 DIAGNOSIS — I10 Essential (primary) hypertension: Secondary | ICD-10-CM | POA: Diagnosis not present

## 2020-08-18 DIAGNOSIS — U071 COVID-19: Secondary | ICD-10-CM | POA: Diagnosis not present

## 2020-08-21 DIAGNOSIS — I69351 Hemiplegia and hemiparesis following cerebral infarction affecting right dominant side: Secondary | ICD-10-CM | POA: Diagnosis not present

## 2020-08-21 DIAGNOSIS — U071 COVID-19: Secondary | ICD-10-CM | POA: Diagnosis not present

## 2020-08-21 DIAGNOSIS — N400 Enlarged prostate without lower urinary tract symptoms: Secondary | ICD-10-CM | POA: Diagnosis not present

## 2020-08-21 DIAGNOSIS — I10 Essential (primary) hypertension: Secondary | ICD-10-CM | POA: Diagnosis not present

## 2020-08-28 DIAGNOSIS — G8190 Hemiplegia, unspecified affecting unspecified side: Secondary | ICD-10-CM | POA: Diagnosis not present

## 2020-08-28 DIAGNOSIS — S7291XD Unspecified fracture of right femur, subsequent encounter for closed fracture with routine healing: Secondary | ICD-10-CM | POA: Diagnosis not present

## 2020-08-28 DIAGNOSIS — R52 Pain, unspecified: Secondary | ICD-10-CM | POA: Diagnosis not present

## 2020-08-28 DIAGNOSIS — I69351 Hemiplegia and hemiparesis following cerebral infarction affecting right dominant side: Secondary | ICD-10-CM | POA: Diagnosis not present

## 2020-08-28 DIAGNOSIS — R279 Unspecified lack of coordination: Secondary | ICD-10-CM | POA: Diagnosis not present

## 2020-08-28 DIAGNOSIS — R69 Illness, unspecified: Secondary | ICD-10-CM | POA: Diagnosis not present

## 2020-08-28 DIAGNOSIS — Z9181 History of falling: Secondary | ICD-10-CM | POA: Diagnosis not present

## 2020-08-28 DIAGNOSIS — Z743 Need for continuous supervision: Secondary | ICD-10-CM | POA: Diagnosis not present

## 2020-08-31 DIAGNOSIS — Z7982 Long term (current) use of aspirin: Secondary | ICD-10-CM | POA: Diagnosis not present

## 2020-08-31 DIAGNOSIS — Z4789 Encounter for other orthopedic aftercare: Secondary | ICD-10-CM | POA: Diagnosis not present

## 2020-08-31 DIAGNOSIS — K5900 Constipation, unspecified: Secondary | ICD-10-CM | POA: Diagnosis not present

## 2020-08-31 DIAGNOSIS — K7460 Unspecified cirrhosis of liver: Secondary | ICD-10-CM | POA: Diagnosis not present

## 2020-08-31 DIAGNOSIS — L89892 Pressure ulcer of other site, stage 2: Secondary | ICD-10-CM | POA: Diagnosis not present

## 2020-08-31 DIAGNOSIS — I10 Essential (primary) hypertension: Secondary | ICD-10-CM | POA: Diagnosis not present

## 2020-08-31 DIAGNOSIS — S72141D Displaced intertrochanteric fracture of right femur, subsequent encounter for closed fracture with routine healing: Secondary | ICD-10-CM | POA: Diagnosis not present

## 2020-08-31 DIAGNOSIS — R69 Illness, unspecified: Secondary | ICD-10-CM | POA: Diagnosis not present

## 2020-08-31 DIAGNOSIS — E785 Hyperlipidemia, unspecified: Secondary | ICD-10-CM | POA: Diagnosis not present

## 2020-08-31 DIAGNOSIS — N400 Enlarged prostate without lower urinary tract symptoms: Secondary | ICD-10-CM | POA: Diagnosis not present

## 2020-08-31 DIAGNOSIS — U071 COVID-19: Secondary | ICD-10-CM | POA: Diagnosis not present

## 2020-08-31 DIAGNOSIS — Z8673 Personal history of transient ischemic attack (TIA), and cerebral infarction without residual deficits: Secondary | ICD-10-CM | POA: Diagnosis not present

## 2020-08-31 DIAGNOSIS — Z7401 Bed confinement status: Secondary | ICD-10-CM | POA: Diagnosis not present

## 2020-08-31 DIAGNOSIS — H9193 Unspecified hearing loss, bilateral: Secondary | ICD-10-CM | POA: Diagnosis not present

## 2020-09-01 DIAGNOSIS — R32 Unspecified urinary incontinence: Secondary | ICD-10-CM | POA: Diagnosis not present

## 2020-09-02 ENCOUNTER — Encounter: Payer: Self-pay | Admitting: Gastroenterology

## 2020-09-11 ENCOUNTER — Other Ambulatory Visit: Payer: Self-pay | Admitting: *Deleted

## 2020-09-11 NOTE — Patient Outreach (Addendum)
[name]) Care Management  09/11/2020  [name] 06/20/1951 [id number]   [name] Telephone Assessment/Screen post snf/complex care, Referral Date: 08/29/20 Referral Source: ping  Referral Reason:  APL Discharged [name] [name] [facility] to Home - with [place] on 08/28/20 after 20 days of stay for Unspecified fracture of right femur, subsequent encounter for closed fracture with routine healing ([id number]).  Insurance: Government social research officer   THN Unsuccessful outreach   Outreach attempt to the home number [phone] No answer. THN RN CM left HIPAA [name] Portability and Accountability Act) compliant voicemail message along with CM's contact info.   Plan: [facility] RN CM scheduled this patient for another call attempt within 4-7 business days Unsuccessful outreach letter sent  Micheal Millin [name], RN, BSN, [place] Coordinator Office number [phone] Mobile number [phone]  Main THN number [phone] Fax number [phone]

## 2020-09-15 DIAGNOSIS — I10 Essential (primary) hypertension: Secondary | ICD-10-CM | POA: Diagnosis not present

## 2020-09-15 DIAGNOSIS — Z7189 Other specified counseling: Secondary | ICD-10-CM | POA: Diagnosis not present

## 2020-09-15 DIAGNOSIS — I6930 Unspecified sequelae of cerebral infarction: Secondary | ICD-10-CM | POA: Diagnosis not present

## 2020-09-15 DIAGNOSIS — E785 Hyperlipidemia, unspecified: Secondary | ICD-10-CM | POA: Diagnosis not present

## 2020-09-15 DIAGNOSIS — Z0000 Encounter for general adult medical examination without abnormal findings: Secondary | ICD-10-CM | POA: Diagnosis not present

## 2020-09-15 DIAGNOSIS — L89609 Pressure ulcer of unspecified heel, unspecified stage: Secondary | ICD-10-CM | POA: Diagnosis not present

## 2020-09-17 ENCOUNTER — Other Ambulatory Visit: Payer: Self-pay | Admitting: *Deleted

## 2020-09-17 NOTE — Patient Outreach (Signed)
[facility]) Care Management  09/17/2020  Grahm Etsitty August 02, 1951 [id number]   THN unsuccessful (2nd) Telephone Assessment/Screen post snf/complex care, Referral Date: 08/29/20 Referral Source: ping  Referral Reason:  APL Discharged from [facility] to Home - with [place] on 08/28/20 after 20 days of stay for Unspecified fracture of right femur, subsequent encounter for closed fracture with routine healing ([id number]).  Insurance: Government social research officer  Call attempt #2 unsuccessful Sister [name] answered  states pt is asleep as the home health therapist "just left"  [facility] RN CM left HIPAA HIPAA ([place] and Accountability Act) compliant voicemail message along with CM's contact info. She reports she will have patient to return a call to [place]: [facility] RN CM scheduled this patient for another call attempt within 4-7 business days Unsuccessful outreach letter sent on 09/11/20 Unsuccessful outreaches on 09/11/20 and 09/17/20   Najah Liverman [name], RN, BSN, [place] Coordinator Office number [phone] Mobile number [phone]  Main THN number [phone] Fax number [phone]

## 2020-09-18 ENCOUNTER — Other Ambulatory Visit: Payer: Self-pay | Admitting: *Deleted
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
## 2020-09-22 ENCOUNTER — Other Ambulatory Visit: Payer: Self-pay

## 2020-09-22 ENCOUNTER — Other Ambulatory Visit: Payer: Self-pay | Admitting: *Deleted

## 2020-09-22 NOTE — Patient Outreach (Addendum)
[facility]) Care Management  09/22/2020  [name] 31-Dec-1950 [id number]   [facility] outreach to complex care patient Referral Date:08/29/20 Referral Source:ping Referral Reason:on APL Discharged from [facility] [place] inpatient to Home - with [place] on 08/28/20 after 20 days of stay for Unspecified fracture of right femur, subsequent encounter for closed fracture with routine healing ([id number]).  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   Follow up assessment Pt reported to be doing well today but a little tired as he was seen by occupational therapy (OT) today   Stroke in 2016 was followed by neurology and services transferred to primary care provider (PCP), Dr Katherine Roan   Hepatitis followed by NP, Roosevelt Locks  Voiced concern Lost photo and Aetna insurance card during recent 08/03/20 admission per [name]. [name] reports placing them in a bag with his pill bottles when he was taken to ED by EMS. She reports outreaching to the hospital unit and to [place] security without success. New insurance card has been re ordered but pending new license [facility] RN CM intervention - outreach to [employer] [phone] spoke with Janett Billow to inquire about pt loss items left in medicine bag per sister  Janett Billow reviewed the pharmacy inventory to confirm patient's  medications were not sent to the pharmacy She reviewed document flow sheet from nursing staff and a PACU note reporting only a pt belonging bag was received, not home medicines bag sent to hospital pharmacy  [facility] RN CM left a message at [facility] health patient experience to have someone return a call to [name] at her mobile number    Plan: [facility] RN CM scheduled this patient for another call attempt within 14-21 business days   [place] [name], RN, BSN, [place] Coordinator Office number [phone] Main [facility] number [phone] Fax number [phone]

## 2020-09-26 DIAGNOSIS — N400 Enlarged prostate without lower urinary tract symptoms: Secondary | ICD-10-CM | POA: Diagnosis not present

## 2020-09-26 DIAGNOSIS — L89610 Pressure ulcer of right heel, unstageable: Secondary | ICD-10-CM | POA: Diagnosis not present

## 2020-09-26 DIAGNOSIS — R32 Unspecified urinary incontinence: Secondary | ICD-10-CM | POA: Diagnosis not present

## 2020-09-26 DIAGNOSIS — G8929 Other chronic pain: Secondary | ICD-10-CM | POA: Diagnosis not present

## 2020-09-26 DIAGNOSIS — Z008 Encounter for other general examination: Secondary | ICD-10-CM | POA: Diagnosis not present

## 2020-09-26 DIAGNOSIS — R69 Illness, unspecified: Secondary | ICD-10-CM | POA: Diagnosis not present

## 2020-09-26 DIAGNOSIS — I69331 Monoplegia of upper limb following cerebral infarction affecting right dominant side: Secondary | ICD-10-CM | POA: Diagnosis not present

## 2020-09-26 DIAGNOSIS — Z7401 Bed confinement status: Secondary | ICD-10-CM | POA: Diagnosis not present

## 2020-09-26 DIAGNOSIS — E785 Hyperlipidemia, unspecified: Secondary | ICD-10-CM | POA: Diagnosis not present

## 2020-09-26 DIAGNOSIS — H04129 Dry eye syndrome of unspecified lacrimal gland: Secondary | ICD-10-CM | POA: Diagnosis not present

## 2020-09-26 DIAGNOSIS — I10 Essential (primary) hypertension: Secondary | ICD-10-CM | POA: Diagnosis not present

## 2020-09-26 DIAGNOSIS — N529 Male erectile dysfunction, unspecified: Secondary | ICD-10-CM | POA: Diagnosis not present

## 2020-09-28 DIAGNOSIS — Z9181 History of falling: Secondary | ICD-10-CM | POA: Diagnosis not present

## 2020-09-28 DIAGNOSIS — I69351 Hemiplegia and hemiparesis following cerebral infarction affecting right dominant side: Secondary | ICD-10-CM | POA: Diagnosis not present

## 2020-09-28 DIAGNOSIS — R69 Illness, unspecified: Secondary | ICD-10-CM | POA: Diagnosis not present

## 2020-09-28 DIAGNOSIS — S7291XD Unspecified fracture of right femur, subsequent encounter for closed fracture with routine healing: Secondary | ICD-10-CM | POA: Diagnosis not present

## 2020-10-01 ENCOUNTER — Other Ambulatory Visit: Payer: Self-pay

## 2020-10-01 ENCOUNTER — Other Ambulatory Visit: Payer: Self-pay | Admitting: *Deleted

## 2020-10-01 NOTE — Patient Outreach (Signed)
[facility]) Care Management  10/01/2020  [name] August 14, 1950 [id number]   [facility] outreach to complex care patient Referral Date:08/29/20 Referral Source:ping Referral Reason:onAPL Discharged from [facility] [place] inpatient to Home - with [name] on 08/28/20 after 20 days of stay for Unspecified fracture of right femur, subsequent encounter for closed fracture with routine healing ([id number]).  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   Follow up assessment  [facility] related to missing ID cards Resources provided [place] [phone]  OTC -encouraged sister to call to get bedpan  Sister reports poor online skills/technologically challenged, a printer at home not set up [place] recommend Forensic psychologist with her the inpatient SW process in   Have to get a [place] ID Older brother sent her [place] ID paper work Allowed sister to ventilate her feelings Answered sister questions about home health, home health services goals and outpatient services  Voiced concern that pt need a boot for foot  Sister has ask home health staff for boot Sister encouraged to call pcp  Thanked sister for advocating for pt Current Outpatient Medications on File Prior to Visit  Medication Sig Dispense Refill  .  acetaminophen (TYLENOL) 500 MG tablet Take 500-1,000 mg by mouth 2 (two) times daily as needed for mild pain or headache.    [name] amLODipine (NORVASC) 10 MG tablet Take 10 mg by mouth in the morning.    [name] atorvastatin (LIPITOR) 10 MG tablet Take 10 mg by mouth every evening.    . chlorthalidone (HYGROTON) 25 MG tablet Take 12-25 mg by mouth See admin instructions. Take 12.5 mg by mouth in the morning if Systolic number is 063 and 25 mg if 130 or greater    . docusate sodium (COLACE) 100 MG capsule Take 1 capsule (100 mg total) by mouth 2 (two) times daily. 10 capsule 0  . ferrous sulfate 325 (65 FE) MG tablet Take 1 tablet (325 mg total) by mouth 2 (two) times daily with a meal.  0  . lisinopril (PRINIVIL,ZESTRIL) 10 MG tablet Take 10 mg by mouth daily.    . methocarbamol (ROBAXIN) 500 MG tablet Take 1 tablet (500 mg total) by mouth every 6 (six) hours as needed for muscle spasms. 40 tablet 0  . metoprolol tartrate (LOPRESSOR) 25 MG tablet Take 0.5 tablets (12.5 mg total) by mouth 2 (two) times daily.    . Multiple Vitamins-Minerals (ONE-A-DAY MENS 50+ ADVANTAGE) TABS Take 1 tablet by mouth daily with breakfast.    . polyethylene glycol (MIRALAX / GLYCOLAX) 17 g packet Take 17 g by mouth 2 (two) times daily. 14 each 0  . potassium chloride (KLOR-CON) 10 MEQ tablet Take 10 mEq by mouth daily.    [name] PRESCRIPTION MEDICATION Take 10 mEq by mouth See admin instructions. Potassium Chloride Extended-Release 10 mEq TABLETS (generic Micro-K) Take 1 tablet by mouth once a day    . RESTASIS 0.05 % ophthalmic emulsion Place 1 drop into both  eyes in the morning and at bedtime.    . tamsulosin (FLOMAX) 0.4 MG CAPS capsule Take 0.4 mg by mouth at bedtime.    . TURMERIC PO Take 1 capsule by mouth 2 (two) times daily.    . VOLTAREN 1 % GEL Apply 2-4 g topically 3 (three) times daily as needed (for knee pain).  2   No current facility-administered medications on file prior to visit.     [facility] RN CM scheduled  this patient for another call attempt within14-21business days  Goals Addressed              This Visit's Progress     Patient Stated   .  [facility]) Find Help in My Community (pt-stated)   On track     Timeframe:  Short-Term Goal Priority:  High Start Date:                 10/01/20            Expected End Date:      11/28/20                 Follow Up Date 10/15/20   - begin a notebook of services in my neighborhood or community - follow-up on any referrals for help I am given - have a back-up plan - make a list of family or friends that I can call     Notes: 10/01/20 has outreached to [employer] and [employer] Provided number for [employer]. [name], RN, BSN, [place] Coordinator Office number [phone] Main [facility] number [phone] Fax number [phone]

## 2020-10-13 DIAGNOSIS — E785 Hyperlipidemia, unspecified: Secondary | ICD-10-CM | POA: Diagnosis not present

## 2020-10-13 DIAGNOSIS — N400 Enlarged prostate without lower urinary tract symptoms: Secondary | ICD-10-CM | POA: Insufficient documentation

## 2020-10-13 DIAGNOSIS — I10 Essential (primary) hypertension: Secondary | ICD-10-CM | POA: Diagnosis not present

## 2020-10-13 DIAGNOSIS — L89609 Pressure ulcer of unspecified heel, unspecified stage: Secondary | ICD-10-CM | POA: Diagnosis not present

## 2020-10-13 DIAGNOSIS — Z7902 Long term (current) use of antithrombotics/antiplatelets: Secondary | ICD-10-CM | POA: Diagnosis not present

## 2020-10-15 ENCOUNTER — Other Ambulatory Visit: Payer: Self-pay | Admitting: *Deleted

## 2020-10-15 ENCOUNTER — Other Ambulatory Visit: Payer: Self-pay

## 2020-10-15 DIAGNOSIS — Z7902 Long term (current) use of antithrombotics/antiplatelets: Secondary | ICD-10-CM | POA: Insufficient documentation

## 2020-10-15 DIAGNOSIS — L89609 Pressure ulcer of unspecified heel, unspecified stage: Secondary | ICD-10-CM | POA: Insufficient documentation

## 2020-10-15 DIAGNOSIS — Z8673 Personal history of transient ischemic attack (TIA), and cerebral infarction without residual deficits: Secondary | ICD-10-CM | POA: Insufficient documentation

## 2020-10-15 DIAGNOSIS — K7460 Unspecified cirrhosis of liver: Secondary | ICD-10-CM | POA: Insufficient documentation

## 2020-10-15 NOTE — Patient Outreach (Signed)
[facility]) Care Management  10/15/2020  [name] 04/17/1951 [id number]   [facility] outreach tocomplex carepatient Referral Date:08/29/20 Referral Source:ping Referral Reason:onAPL [facility]charged from [facility] [place] [facility] to Home - with [place] on 08/28/20 after 20 days of stay for Unspecified fracture of right femur, subsequent encounter for closed fracture with routine healing ([id number]).  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   Follow up assessment Sister finished bathing patient prior to outreach He can remove his clothes He wears depends Has not started getting up to his bedside commode yet Pt enjoys watching TV waiting on his boot He has a new [place] ID  Allowed sister to ventilate her feelings Has not been seen by ortho continues HH PT at least once a week pt getting stronger Pt is not doing exercises after [facility] sessions Sister has POA papers but needs to be completed and notarized SW called to verify pt got meals. Pending listing for housing  Encouraged her to speak with home health therapists to discuss pt goals to use bedside commode and to get out of home for possible   [facility] RN CM scheduled this patient for another call attempt within14-21business days   [name] [name], RN, BSN, [employer]  Coordinator Office number [phone] Main [facility] number [phone] Fax number [phone]

## 2020-10-24 DIAGNOSIS — R32 Unspecified urinary incontinence: Secondary | ICD-10-CM | POA: Diagnosis not present

## 2020-10-26 DIAGNOSIS — I69351 Hemiplegia and hemiparesis following cerebral infarction affecting right dominant side: Secondary | ICD-10-CM | POA: Diagnosis not present

## 2020-10-26 DIAGNOSIS — R69 Illness, unspecified: Secondary | ICD-10-CM | POA: Diagnosis not present

## 2020-10-26 DIAGNOSIS — Z9181 History of falling: Secondary | ICD-10-CM | POA: Diagnosis not present

## 2020-10-26 DIAGNOSIS — S7291XD Unspecified fracture of right femur, subsequent encounter for closed fracture with routine healing: Secondary | ICD-10-CM | POA: Diagnosis not present

## 2020-11-12 ENCOUNTER — Other Ambulatory Visit: Payer: Self-pay

## 2020-11-12 ENCOUNTER — Other Ambulatory Visit: Payer: Self-pay | Admitting: *Deleted

## 2020-11-12 NOTE — Patient Outreach (Addendum)
[facility]) Care Management  11/12/2020  Jerred Zaremba [birth year] [id number]   [facility] outreach tocomplex carepatient Referral Date:08/29/20 Referral Source:ping Referral Reason:onAPL Discharged from [place] inpatient to Home - with [place] on 08/28/20 after 20 days of stay for Unspecified fracture of right femur, subsequent encounter for closed fracture with routine healing  Insurance:AetnamedicareHMO/PPO/ Medicaid of [place] Last admission on 08/03/20-08/08/20 closed right hip fractureafter a fall, history of stroke, 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 [name] has developmental concerns and provides permission for his sister, [name] to speak with [facility] staff  Follow up assessment [name] reports the patient is improving with mobility but is still not able to ambulate He is now able to slide in/transfer into his wheelchair  He needs a walker without wheels and with gliders as recommended by the [place] reports it was suggested getting from [employer] as sister does not prefer to leave pt to complete errands other than go to the mail box [facility] RN CM discussed the process of requesting a MD order for a local DME provider to file with insurance carrier. Sister provided permission for [facility] RN CM to outreach to pcp He is reported to be able to stand up 2 minutes at a time  Using [facility] for [facility] health services   Allowed sister/caregiver to ventilate her feelings related to being pt care giver, short visits but lack of  sufficient support  Spoke with sister about outreaching to Medicaid/DSS to request Personal care services (PCS) aide with pt's medicaid coverage Sister voices she is uncomfortable with having various people in her apartment She reports incidents with several agencies she did not have preference for related to past services hx of bed bugs brought in from an agency staff She is aware of DSS number [phone] and [place] home services She reports she is not aware of the DSS case worker's name   Sister not preferring [facility] referral but will outreach to [place] RN CM interventions  Outreach to [employer] and spoke with Reana to request assist with order of standard walker with gliders for pt with weight, 175 lbs & height of 5'7" And to give sister apology to Dr Daphene Jaeger about requesting un needed refills still covered by Leonel Ramsay   [facility] RN CM to follow up with pt/family within the next 30 business days  Pt encouraged to return a call to [facility] RN CM prn  Goals Addressed              This Visit's Progress     Patient Stated   .  [facility]) Find Help in My Community (pt-stated)   On track     Timeframe:  Short-Term Goal Priority:  High Start Date:                 10/01/20            Expected End Date:      12/30/20                 Follow Up Date 12/03/20   - begin a  notebook of services in my neighborhood or community - follow-up on any referrals for help I am given - have a back-up plan - make a list of family or friends that I can call     Notes: 11/12/20 reviewed access to DSS/medicaid for personal care services [place] home MD visits. Sister has DSS number. Back up plan is for sister to continue to care for pt.  Pending standard walker with gliders with assist of pcp  10/01/20 has outreached to [employer] and [employer] Provided number for [employer]. [name], RN, BSN, [place] Coordinator Office number [phone] Main [facility] number  [phone] Fax number [phone]

## 2020-11-22 DIAGNOSIS — Z23 Encounter for immunization: Secondary | ICD-10-CM | POA: Diagnosis not present

## 2020-11-26 DIAGNOSIS — Z9181 History of falling: Secondary | ICD-10-CM | POA: Diagnosis not present

## 2020-11-26 DIAGNOSIS — I69351 Hemiplegia and hemiparesis following cerebral infarction affecting right dominant side: Secondary | ICD-10-CM | POA: Diagnosis not present

## 2020-11-26 DIAGNOSIS — R69 Illness, unspecified: Secondary | ICD-10-CM | POA: Diagnosis not present

## 2020-11-26 DIAGNOSIS — R32 Unspecified urinary incontinence: Secondary | ICD-10-CM | POA: Diagnosis not present

## 2020-11-26 DIAGNOSIS — S7291XD Unspecified fracture of right femur, subsequent encounter for closed fracture with routine healing: Secondary | ICD-10-CM | POA: Diagnosis not present

## 2020-12-03 ENCOUNTER — Other Ambulatory Visit: Payer: Self-pay

## 2020-12-03 ENCOUNTER — Other Ambulatory Visit: Payer: Self-pay | Admitting: *Deleted

## 2020-12-03 NOTE — Patient Outreach (Signed)
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 services with the sister She gave verbal consent for services. [name] has developmental concerns and provides permission for his sister, [name] to speak with [facility] staff  Follow up assessment Sister [name] has purchased Pt is now walking in the apartment to the kitchen and to the bathroom   Wound still not healed but [facility] RN visited on 12/01/20 and continues  Still wearing the foot boot and floating heal with styrofoam boot when boot not on She is still bathing him as he rolls independent No longer using hoyer and was returned to [employer] acknowledges pt may not be "able to walk like he use to but he is doing better" Earnstine Regal [facility] PT [place] OT Liliane Channel motivate patient which his sister appreciates  [name] will be able to go out for a spa day on this upcoming weekend as her cousin "from [place] certified"   Plans Patient agrees to care plan and follow up within the next 30 business days   Kayode Petion [name], RN, BSN, [place] Coordinator Office number [phone] Main [facility] number [phone] Fax number [phone]

## 2020-12-08 ENCOUNTER — Other Ambulatory Visit: Payer: Self-pay | Admitting: *Deleted

## 2020-12-08 NOTE — Patient Outreach (Signed)
[facility]) Care Management  12/08/2020  [name] 01/16/1951 [id number]  [facility] outreach tocomplex carepatient Referral Date:08/29/20 Referral Source:ping Referral Reason:onAPL Discharged from [facility] [place] inpatient to Home - with [place] on 08/28/20 after 20 days of stay for Unspecified fracture of right femur, subsequent encounter for closed fracture with routine healing  Insurance:AetnamedicareHMO/PPO/ Medicaid of [place] Last admission on 08/03/20-08/08/20 closed right hip fractureafter a fall, history of stroke, 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 Medicaid card information was provided and bills to be resubmitted Sister has outreached to [place] who states a pcp name Mr Frances Furbish was listed for the patient [name] clarified pt pcp is Dr Daphene Jaeger with [place] staff who stated they would outreach to  Dr Daphene Jaeger office  This outreach per [name] is still pending  Plans Patient will have a follow up within the next 30 business days   [name] [name], RN, BSN, [place] Coordinator Office number [phone] Main [facility] number [phone] Fax number [phone]

## 2020-12-22 ENCOUNTER — Other Ambulatory Visit: Payer: Self-pay | Admitting: *Deleted

## 2020-12-22 NOTE — Patient Outreach (Signed)
[facility]) Care Management  12/22/2020  [name] 23-Jan-1951 [id number]  Mark coordination- collaboration with pcp/coverage   Outreach to Dr Daphene Jaeger office  spoke with Rheanna at [phone] She confirms they except medicaid of [place] coverage  Confirm they have the [employer] on file for this patient as primary but does not have the medicaid coverage listed Confirms the sister has placed several calls  Their notes states "Required authorization to be seen" when they filed claims Pt remains inactive with Dr Daphene Jaeger office as of today per Rheanna  Left a message with Rheanna for [name] of the accounting staff of the pcp  Plan Patient/sister to be updated  Continue to collaborate with [facility] [name], RN, BSN, [place] Coordinator Office number [phone] Mobile number [phone]  Main [facility] number [phone] Fax number [phone]

## 2020-12-26 DIAGNOSIS — Z9181 History of falling: Secondary | ICD-10-CM | POA: Diagnosis not present

## 2020-12-26 DIAGNOSIS — R69 Illness, unspecified: Secondary | ICD-10-CM | POA: Diagnosis not present

## 2020-12-26 DIAGNOSIS — I69351 Hemiplegia and hemiparesis following cerebral infarction affecting right dominant side: Secondary | ICD-10-CM | POA: Diagnosis not present

## 2020-12-26 DIAGNOSIS — S7291XD Unspecified fracture of right femur, subsequent encounter for closed fracture with routine healing: Secondary | ICD-10-CM | POA: Diagnosis not present

## 2021-01-07 ENCOUNTER — Encounter: Payer: Self-pay | Admitting: *Deleted

## 2021-01-07 ENCOUNTER — Other Ambulatory Visit: Payer: Self-pay

## 2021-01-07 ENCOUNTER — Other Ambulatory Visit: Payer: Self-pay | Admitting: *Deleted
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
## 2021-01-21 ENCOUNTER — Other Ambulatory Visit: Payer: Self-pay | Admitting: *Deleted

## 2021-01-21 NOTE — Patient Outreach (Signed)
[facility]) Care Management  01/21/2021  Erwin Nishiyama 13-Jan-1951 [id number]   [facility] Telephone Assessment/Screen insurance referral  Referral Date: 01/13/21 Referral Source: [facility]  Reason for Referral: 1 admit, HTN; RN CC   Insurance: Medicaid/united healthcare effective 12/31/20 (previously [employer])  [employer] leadership for eligibility    Plan: [facility] RN CM will outreach if found eligible for services  Lamario Mani [name], RN, BSN, [place] Coordinator Office number [phone] Mobile number [phone]  Main [facility] number [phone] Fax number [phone]

## 2021-01-30 ENCOUNTER — Other Ambulatory Visit: Payer: Self-pay | Admitting: *Deleted

## 2021-01-30 NOTE — Patient Outreach (Signed)
[name]) Care Management  01/30/2021  [name] Dec 28, 1950 [id number]   [facility] Telephone Assessment/Screen for Aetna Medicare/ HTN referral  Referral Date: 01/13/21 Referral Source: aetna hypertension initiative Referral Reason: aetna hypertension initiative screening Insurance: [name] changed from [place] to  [name] care [facility]) medicare effective 12/31/20 per his sister    Outreach attempt successful to sister [name] sister is able to verify HIPAA, DOB and address Reviewed and addressed Aetna medicare HTN referral to [facility] with her Consent: [facility] RN CM reviewed [name] services with her. She gave verbal consent for services [facility] telephonic RN CM.  Hypertension (HTN) Well managed at home  He has a blood pressure (BP) monitor and checks daily remains below 140/90 Now ambulating in the home but not outside of apartment Continues to be visited by Dr Daphene Jaeger, home visiting MD [place] reviewed [facility] transportation services at [phone]. Sister request this information be text or mailed to her   Denies any care coordination or medical needs at this time Progression for [name] services discussed and preference is to continue case closure as pt was recently closed for complex [name] services Sister aware of option of outreaching to [name] RN CM prn   Patient Active Problem List   Diagnosis Date Noted   Encounter for current long term use of antiplatelet drug 10/15/2020   History of embolic stroke without residual deficits 10/15/2020   Cirrhosis of liver ([place]) 10/15/2020   Pressure ulcer of heel 10/15/2020   Benign prostatic hyperplasia without urinary obstruction 10/13/2020   Closed right hip fracture, initial encounter ([place]) 08/03/2020   History of stroke 08/03/2020   Chronic hepatitis C ([place]) 08/03/2020   Unilateral primary osteoarthritis, right knee 04/24/2018   Osteoarthritis of knee 04/24/2018   Gait disturbance, post-stroke  01/21/2015   Arrhythmia 12/02/2014   Palpitations 12/02/2014   Cerebral infarction due to thrombosis of left middle cerebral artery ([name]) 12/02/2014   Hyperlipidemia 12/02/2014   Right hemiparesis ([name]) 09/25/2014   Other secondary osteoarthritis of both knees 09/25/2014   Basal ganglia infarction ([name]) 09/25/2014   Ischemic stroke ([name]) 09/22/2014   Dyslipidemia 09/22/2014   Thrombocytopenic disorder ([name]) 09/22/2014   Essential hypertension    Stroke ([name]) 09/21/2014   Uncontrolled hypertension 09/21/2014     Plan: Patient sister [name] requests no follow up at this time [facility] RN CM will closes this case  [name] [name], RN, BSN, [place] Coordinator Office number [phone] Mobile number [phone]  Main [facility] number [phone] Fax number [phone]

## 2021-02-09 ENCOUNTER — Other Ambulatory Visit: Payer: Self-pay | Admitting: *Deleted

## 2021-02-09 NOTE — Patient Outreach (Signed)
[name]) Care Management  02/09/2021  [name] [birth year] [id number]   [place] coordination- Return call to family  Incoming call from sister [name] with voice message left by her at 1623 02/06/21  Returned a call to her  She is able to verify HIPAA ([place] and Accountability Act) identifiers Reviewed and addressed the purpose of the follow up call   Consent: [facility] ([place]) RN CM reviewed [facility] services  Verbal consent was given for services.  Pt/sister voiced concerns if they answered  [facility]) annual outreach questions correctly Questions answered about COPD, labs Pt without history of COPD but history of pulmonary nodule followed by Previous MD Katherine Roan Discussed the standard length of time to receive final labs reports  Sister voiced lots appreciation for the outreach and the questions answered She also reports she has received the case closure letter and the transportation information for future use.   [name] [name], RN, BSN, [place] Coordinator Office number [phone] Mobile number [phone]  Main [facility] number [phone] Fax number [phone]

## 2021-12-08 ENCOUNTER — Encounter (HOSPITAL_BASED_OUTPATIENT_CLINIC_OR_DEPARTMENT_OTHER): Payer: 59 | Attending: General Surgery | Admitting: General Surgery

## 2021-12-08 DIAGNOSIS — K7460 Unspecified cirrhosis of liver: Secondary | ICD-10-CM | POA: Diagnosis not present

## 2021-12-08 DIAGNOSIS — Z8616 Personal history of COVID-19: Secondary | ICD-10-CM | POA: Insufficient documentation

## 2021-12-08 DIAGNOSIS — L89610 Pressure ulcer of right heel, unstageable: Secondary | ICD-10-CM | POA: Insufficient documentation

## 2021-12-08 DIAGNOSIS — Z87891 Personal history of nicotine dependence: Secondary | ICD-10-CM | POA: Diagnosis not present

## 2021-12-08 DIAGNOSIS — I6930 Unspecified sequelae of cerebral infarction: Secondary | ICD-10-CM | POA: Diagnosis not present

## 2021-12-08 DIAGNOSIS — B182 Chronic viral hepatitis C: Secondary | ICD-10-CM | POA: Insufficient documentation

## 2021-12-08 DIAGNOSIS — I10 Essential (primary) hypertension: Secondary | ICD-10-CM | POA: Insufficient documentation

## 2021-12-08 NOTE — Progress Notes (Signed)
[name], [name] ([id number]) ?Visit Report for 12/08/2021 ?Abuse Risk Screen Details ?Patient Name: Date of Service: ?[name], [name] ?Medical Record Number: [id number] ?Patient Account Number: [id number] ?Date of Birth/Sex: Treating RN: ?09-Jul-1951 (71 y.o. [name] ?Primary Care [name]: [name] Other Clinician: ?Referring Nayeliz Hipp: ?Treating Dekayla Prestridge/Extender: [name] ?[name] ?[name] ?Abuse Risk Screen Items ?Answer ?ABUSE RISK SCREEN: ?Has anyone close to you tried to hurt or harm you recentlyo No ?Do you feel uncomfortable with anyone in your familyo No ?Has anyone forced you do things that you didnt want to doo No ?Electronic Signature(s) ?Signed: 12/08/2021 5:12:22 [name] By: Adline Peals ?Entered By: Adline Peals on 12/08/2021 14:04:57 ?-------------------------------------------------------------------------------- ?Activities of Daily Living Details ?Patient Name: Date of Service: ?[name], [name] ?Medical Record Number: [id number] ?Patient Account Number: [id number] ?Date of Birth/Sex: Treating RN: ?January 07, 1951 (71 y.o. [name] ?Primary Care Eydan Chianese: [name] Other Clinician: ?Referring Cejay Cambre: ?Treating Caryl Manas/Extender: [name] ?[name] ?[name] ?Activities of Daily Living Items ?Answer ?Activities of Daily Living (Please select one for each item) ?Drive Automobile Not Able ?T Medications ?ake Need Assistance ?Use T elephone Completely Able ?Care for Appearance Need Assistance ?Use T oilet Completely Able ?Bath / Shower Need Assistance ?Dress Self Need Assistance ?Feed Self Completely Able ?Walk Need Assistance ?Get In / Out Bed Completely Able ?Housework Not Able ?Prepare Meals Not Able ?Handle Money Need Assistance ?Shop for Self Not Able ?Electronic Signature(s) ?Signed: 12/08/2021 5:12:22 [name] By: Adline Peals ?Entered By: Adline Peals  on 12/08/2021 14:07:46 ?-------------------------------------------------------------------------------- ?Education Screening Details ?Patient Name: ?Date of Service: ?[name], [name] ?Medical Record Number: [id number] ?Patient Account Number: [id number] ?Date of Birth/Sex: ?Treating RN: ?02/10/1951 (71 y.o. [name] ?Primary Care Shermaine Rivet: [name] ?Other Clinician: ?Referring Kenzee Bassin: ?Treating Nichol Ator/Extender: [name] ?[name] ?[name] ?Learning Preferences/Education Level/Primary Language ?Learning Preference: Explanation, Demonstration, Video, Printed Material ?Highest Education Level: High School ?Preferred Language: English ?Cognitive Barrier ?Language Barrier: No ?Translator Needed: No ?Memory Deficit: No ?Emotional Barrier: No ?Cultural/Religious Beliefs Affecting Medical Care: No ?Physical Barrier ?Impaired Vision: Yes Glasses ?Impaired Hearing: No ?Decreased Hand dexterity: No ?Knowledge/Comprehension ?Knowledge Level: Low ?Comprehension Level: Low ?Ability to understand written instructions: Medium ?Ability to understand verbal instructions: Medium ?Motivation ?Anxiety Level: Calm ?Cooperation: Cooperative ?Education Importance: Acknowledges Need ?Interest in Health Problems: Asks Questions ?Perception: Coherent ?Willingness to Engage in Self-Management Medium ?Activities: ?Readiness to Engage in Self-Management Medium ?Activities: ?Electronic Signature(s) ?Signed: 12/08/2021 5:12:22 [name] By: Adline Peals ?Entered By: Adline Peals on 12/08/2021 14:09:09 ?-------------------------------------------------------------------------------- ?Fall Risk Assessment Details ?Patient Name: ?Date of Service: ?[name], [name] ?Medical Record Number: [id number] ?Patient Account Number: [id number] ?Date of Birth/Sex: ?Treating RN: ?10-20-1950 (71 y.o. [name] ?Primary Care Randi College: [name] ?Other  Clinician: ?Referring Jay Haskew: ?Treating Aja Whitehair/Extender: [name] ?[name] ?[name] ?Fall Risk Assessment Items ?Have you had 2 or more falls in the last 12 monthso 0 No ?Have you had any fall that resulted in injury in the last 12 monthso 0 No ?FALLS RISK SCREEN ?History of falling - immediate or within 3 months 0 No ?Secondary diagnosis (Do you have 2 or more medical diagnoseso) 15 Yes ?Ambulatory aid ?None/bed rest/wheelchair/nurse 0 Yes ?Crutches/cane/walker 15 Yes ?Furniture 0 No ?Intravenous therapy Access/Saline/Heparin Lock 0 No ?Gait/Transferring ?Normal/ bed rest/ wheelchair 0 Yes ?  Weak (short steps with or without shuffle, stooped but able to lift head while walking, may seek 10 Yes ?support from furniture) ?Impaired (short steps with shuffle, may have difficulty arising from chair, head down, impaired 20 Yes ?balance) ?Mental Status ?Oriented to own ability 0 Yes ?Electronic Signature(s) ?Signed: 12/08/2021 5:12:22 [name] By: Adline Peals ?Entered By: Adline Peals on 12/08/2021 14:09:51 ?-------------------------------------------------------------------------------- ?Foot Assessment Details ?Patient Name: ?Date of Service: ?[name], [name] ?Medical Record Number: [id number] ?Patient Account Number: [id number] ?Date of Birth/Sex: ?Treating RN: ?03-27-1951 (71 y.o. [name] ?Primary Care Santoria Chason: [name] ?Other Clinician: ?Referring Runell Kovich: ?Treating Karder Goodin/Extender: [name] ?[name] ?[name] ?Foot Assessment Items ?Site Locations ?+ = Sensation present, - = Sensation absent, C = Callus, U = Ulcer ?R = Redness, W = Warmth, M = Maceration, PU = Pre-ulcerative lesion ?F = Fissure, S = Swelling, D = Dryness ?Assessment ?Right: Left: ?Other Deformity: No No ?Prior Foot Ulcer: No No ?Prior Amputation: No No ?Charcot Joint: No No ?Ambulatory Status: ?Gait: ?Electronic Signature(s) ?Signed:  12/08/2021 5:12:22 [name] By: Adline Peals ?Entered By: Adline Peals on 12/08/2021 14:18:24 ?-------------------------------------------------------------------------------- ?Nutrition Risk Screening Details ?Patient Name: ?Date of Service: ?[name], [name] ?Medical Record Number: [id number] ?Patient Account Number: [id number] ?Date of Birth/Sex: ?Treating RN: ?1951-02-13 (71 y.o. [name] ?Primary Care Jemina Scahill: [name] ?Other Clinician: ?Referring [place]: ?Treating Billie Trager/Extender: [name] ?[name] ?[name] ?Height (in): ?Weight (lbs): ?Body Mass Index (BMI): ?Nutrition Risk Screening Items ?Score Screening ?NUTRITION RISK SCREEN: ?I have an illness or condition that made me change the kind and/or amount of food I eat 2 Yes ?I eat fewer than two meals per day 0 No ?I eat few fruits and vegetables, or milk products 2 Yes ?I have three or more drinks of beer, liquor or wine almost every day 0 No ?I have tooth or mouth problems that make it hard for me to eat 0 No ?I don't always have enough money to buy the food I need 0 No ?I eat alone most of the time 0 No ?I take three or more different prescribed or over-the-counter drugs a day 1 Yes ?Without wanting to, I have lost or gained 10 pounds in the last six months 0 No ?I am not always physically able to shop, cook and/or feed myself 2 Yes ?Nutrition Protocols ?Good Risk Protocol ?Moderate Risk Protocol ?High Risk Proctocol 0 Provide education on nutrition ?Risk Level: High Risk ?Score: 7 ?Electronic Signature(s) ?Signed: 12/08/2021 5:12:22 [name] By: Adline Peals ?Entered By: Adline Peals on 12/08/2021 14:10:38 ?
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
## 2021-12-10 NOTE — Progress Notes (Signed)
[place], [name] ([id number]) ?Visit Report for 12/08/2021 ?Allergy List Details ?Patient Name: Date of Service: ?[name], [name] 12/08/2021 1:30 PM ?Medical Record Number: [id number] ?Patient Account Number: [id number] ?Date of Birth/Sex: Treating RN: ?1951-07-24 (71 y.o. [name] ?Primary Care [name]: [name] Other Clinician: ?Referring [name]: ?Treating [name]/Extender: [name] ?[name] ?[name] ?Allergies ?Active Allergies ?No Known Allergies ?Allergy Notes ?Electronic Signature(s) ?Signed: 12/08/2021 5:12:22 PM By: Adline Peals ?Entered By: Adline Peals on 12/08/2021 13:41:06 ?-------------------------------------------------------------------------------- ?Arrival Information Details ?Patient Name: Date of Service: ?[name], [name] 12/08/2021 1:30 PM ?Medical Record Number: [id number] ?Patient Account Number: [id number] ?Date of Birth/Sex: Treating RN: ?[birth year] (71 y.o. [name] ?Primary Care [name]: [name] Other Clinician: ?Referring [name]: ?Treating [name]/Extender: [name] ?[name] ?[name] ?Visit Information ?Patient Arrived: Wheel Chair ?Arrival Time: 13:31 ?Accompanied By: sister ?Transfer Assistance: None ?Patient Identification Verified: Yes ?Secondary Verification Process Completed: Yes ?Patient Requires Transmission-Based Precautions: No ?Patient Has Alerts: Yes ?Patient Alerts: Patient on Blood Thinner ?Electronic Signature(s) ?Signed: 12/08/2021 5:12:22 PM By: Adline Peals ?Entered By: Adline Peals on 12/08/2021 13:39:12 ?-------------------------------------------------------------------------------- ?Clinic Level of Care Assessment Details ?Patient Name: Date of Service: ?[name], [name] 12/08/2021 1:30 PM ?Medical Record Number: [id number] ?Patient Account Number: [id number] ?Date of Birth/Sex: Treating RN: ?1950-11-08 (71 y.o. [name] ?Primary Care Bryne Lindon: [name] Other Clinician: ?Referring Clora Ohmer: ?Treating Sejla Marzano/Extender: [name] ?[name] ?[name] ?Clinic Level of Care Assessment Items ?TOOL 1 Quantity Score ?X- 1 0 ?Use when EandM and Procedure is performed on INITIAL visit ?ASSESSMENTS - Nursing Assessment / Reassessment ?X- 1 20 ?General Physical Exam (combine w/ comprehensive assessment (listed just below) when performed on new pt. evals) ?X- 1 25 ?Comprehensive Assessment (HX, ROS, Risk Assessments, Wounds Hx, etc.) ?ASSESSMENTS - Wound and Skin Assessment / Reassessment ?'[]'$  - 0 ?Dermatologic / Skin Assessment (not related to wound area) ?ASSESSMENTS - Ostomy and/or Continence Assessment and Care ?'[]'$  - 0 ?Incontinence Assessment and Management ?'[]'$  - 0 ?Ostomy Care Assessment and Management (repouching, etc.) ?PROCESS - Coordination of Care ?X - Simple Patient / Family Education for ongoing care 1 15 ?'[]'$  - 0 ?Complex (extensive) Patient / Family Education for ongoing care ?X- 1 10 ?Staff obtains Consents, Records, T Results / Process Orders ?est ?'[]'$  - 0 ?Staff telephones HHA, Nursing Homes / Clarify orders / etc ?'[]'$  - 0 ?Routine Transfer to another Facility (non-emergent condition) ?'[]'$  - 0 ?Routine Hospital Admission (non-emergent condition) ?X- 1 15 ?New Admissions / Biomedical engineer / Ordering NPWT Apligraf, etc. ?, ?'[]'$  - 0 ?Emergency Hospital Admission (emergent condition) ?PROCESS - Special Needs ?'[]'$  - 0 ?Pediatric / Minor Patient Management ?'[]'$  - 0 ?Isolation Patient Management ?'[]'$  - 0 ?Hearing / Language / Visual special needs ?'[]'$  - 0 ?Assessment of Community assistance (transportation, D/C planning, etc.) ?'[]'$  - 0 ?Additional assistance / Altered mentation ?'[]'$  - 0 ?Support Surface(s) Assessment (bed, cushion, seat, etc.) ?INTERVENTIONS - Miscellaneous ?'[]'$  - 0 ?External ear exam ?'[]'$  - 0 ?Patient Transfer (multiple staff / Civil Service fast streamer / Similar devices) ?'[]'$  - 0 ?Simple Staple /  Suture removal (25 or less) ?'[]'$  - 0 ?Complex Staple / Suture removal (26 or more) ?'[]'$  - 0 ?Hypo/Hyperglycemic Management (do not check if billed separately) ?X- 1 15 ?Ankle / Brachial Index (ABI) - do not check if billed separately ?Has the patient been seen at the hospital within the last  three years: Yes ?Total Score: 100 ?Level Of Care: New/Established - Level 3 ?Electronic Signature(s) ?Signed: 12/08/2021 5:12:22 PM By: Adline Peals ?Entered By: Adline Peals on 12/08/2021 16:04:46 ?-------------------------------------------------------------------------------- ?Encounter Discharge Information Details ?Patient Name: ?Date of Service: ?[name], [name] 12/08/2021 1:30 PM ?Medical Record Number: [id number] ?Patient Account Number: [id number] ?Date of Birth/Sex: ?Treating RN: ?12/31/1950 (71 y.o. [name] ?Primary Care [name]: [name] ?Other Clinician: ?Referring [name]: ?Treating [name]/Extender: [name] ?[name] ?[name] ?Encounter Discharge Information Items Post Procedure Vitals ?Discharge Condition: Stable ?Temperature (F): 97.9 ?Ambulatory Status: Wheelchair ?Pulse (bpm): 63 ?Discharge Destination: Home ?Respiratory Rate (breaths/min): 18 ?Transportation: Other ?Blood Pressure (mmHg): 138/77 ?Accompanied By: sister ?Schedule Follow-up Appointment: Yes ?Clinical Summary of Care: Patient Declined ?Electronic Signature(s) ?Signed: 12/08/2021 5:12:22 PM By: Adline Peals ?Entered By: Adline Peals on 12/08/2021 15:58:25 ?-------------------------------------------------------------------------------- ?Lower Extremity Assessment Details ?Patient Name: ?Date of Service: ?[name], [name] 12/08/2021 1:30 PM ?Medical Record Number: [id number] ?Patient Account Number: [id number] ?Date of Birth/Sex: ?Treating RN: ?1951/03/21 (70 y.o. [name] ?Primary Care [name]: [name] ?Other Clinician: ?Referring [name]: ?Treating  [name]/Extender: [name] ?[name] ?[name] ?Edema Assessment ?Assessed: [Left: No] [Right: No] ?E[Left: dema] [Right: :] ?Calf ?Left: Right: ?Point of Measurement: From Medial Instep 32.2 cm ?Ankle ?Left: Right: ?Point of Measurement: From Medial Instep 23.2 cm ?Vascular Assessment ?Pulses: ?Dorsalis Pedis ?Palpable: [Right:Yes] ?Notes ?R ABI noncompressible ?Electronic Signature(s) ?Signed: 12/08/2021 5:12:22 PM By: Adline Peals ?Entered By: Adline Peals on 12/08/2021 14:33:45 ?-------------------------------------------------------------------------------- ?Multi Wound Chart Details ?Patient Name: ?Date of Service: ?[name], [name] 12/08/2021 1:30 PM ?Medical Record Number: [id number] ?Patient Account Number: [id number] ?Date of Birth/Sex: ?Treating RN: ?02-Aug-1951 (71 y.o. [name] ?Primary Care Myya Meenach: [name] ?Other Clinician: ?Referring Victoriah Wilds: ?Treating Denni France/Extender: [name] ?[name] ?[name] ?Vital Signs ?Height(in): ?Pulse(bpm): 63 ?Weight(lbs): ?Blood Pressure(mmHg): 138/77 ?Body Mass Index(BMI): ?Temperature(??F): 97.9 ?Respiratory Rate(breaths/min): 18 ?Photos: [[name]/A:[name]/A] ?Right Calcaneus [name]/A [name]/A ?Wound Location: ?Pressure Injury [name]/A [name]/A ?Wounding Event: ?Pressure Ulcer [name]/A [name]/A ?Primary Etiology: ?Cataracts, Hypertension, Cirrhosis , [name]/A [name]/A ?Comorbid History: ?Hepatitis C ?07/02/2020 [name]/A [name]/A ?Date Acquired: ?0 [name]/A [name]/A ?Weeks of Treatment: ?Open [name]/A [name]/A ?Wound Status: ?No [name]/A [name]/A ?Wound Recurrence: ?4.2x7.5x0.1 [name]/A [name]/A ?Measurements L x W x D (cm) ?24.74 [name]/A [name]/A ?A (cm?) : ?rea ?2.474 [name]/A [name]/A ?Volume (cm?) : ?0.00% [name]/A [name]/A ?% Reduction in A rea: ?0.00% [name]/A [name]/A ?% Reduction in Volume: ?Unstageable/Unclassified [name]/A [name]/A ?Classification: ?Large [name]/A [name]/A ?Exudate A mount: ?Serosanguineous [name]/A [name]/A ?Exudate Type: ?red, brown [name]/A [name]/A ?Exudate Color: ?Indistinct, nonvisible [name]/A [name]/A ?Wound Margin: ?Small (1-33%) [name]/A  [name]/A ?Granulation A mount: ?Red [name]/A [name]/A ?Granulation Quality: ?Large (67-100%) [name]/A [name]/A ?Necrotic A mount: ?Eschar, Adherent Slough [name]/A [name]/A ?Necrotic Tissue: ?Fat Layer (Subcutaneous Tissue): Yes [name]/A [name]/A ?Exposed

## 2021-12-15 ENCOUNTER — Encounter (HOSPITAL_BASED_OUTPATIENT_CLINIC_OR_DEPARTMENT_OTHER): Payer: 59 | Admitting: General Surgery

## 2021-12-15 DIAGNOSIS — L89610 Pressure ulcer of right heel, unstageable: Secondary | ICD-10-CM | POA: Diagnosis not present
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
## 2021-12-15 NOTE — Progress Notes (Signed)
Coolidge, [name] ([id number]) ?Visit Report for 12/15/2021 ?Arrival Information Details ?Patient Name: Date of Service: ?[name], [place] [name] 12/15/2021 1:00 PM ?Medical Record Number: [id number] ?Patient Account Number: [id number] ?Date of Birth/Sex: Treating RN: ?1951/02/25 (71 y.o. [name] ?Primary Care Aizah Gehlhausen: [name] Other Clinician: ?Referring Kedra Mcglade: ?Treating Dorismar Chay/Extender: [name] ?[name] ?Weeks in Treatment: 1 ?Visit Information History Since Last Visit ?Added or deleted any medications: No ?Patient Arrived: Wheel Chair ?Any new allergies or adverse reactions: No ?Arrival Time: 13:31 ?Had a fall or experienced change in No ?Accompanied By: alone ?activities of daily living that may affect ?Transfer Assistance: Manual ?risk of falls: ?Patient Identification Verified: Yes ?Signs or symptoms of abuse/neglect since last visito No ?Secondary Verification Process Completed: Yes ?Hospitalized since last visit: No ?Patient Requires Transmission-Based Precautions: No ?Implantable device outside of the clinic excluding No ?Patient Has Alerts: Yes ?cellular tissue based products placed in the center ?Patient Alerts: Patient on Blood Thinner since last visit: ?Has Dressing in Place as Prescribed: Yes ?Pain Present Now: No ?Electronic Signature(s) ?Signed: 12/15/2021 5:45:24 PM By: Levan Hurst RN, BSN ?Entered By: Levan Hurst on 12/15/2021 13:31:27 ?-------------------------------------------------------------------------------- ?Encounter Discharge Information Details ?Patient Name: Date of Service: ?[name], [place] [name] 12/15/2021 1:00 PM ?Medical Record Number: [id number] ?Patient Account Number: [id number] ?Date of Birth/Sex: Treating RN: ?26-Feb-1951 (71 y.o. [name] ?Primary Care Corra Kaine: [name] Other Clinician: ?Referring Tenoch Mcclure: ?Treating Cordarious Zeek/Extender: [name] ?[name] ?Weeks in Treatment: 1 ?Encounter Discharge Information  Items Post Procedure Vitals ?Discharge Condition: Stable ?Temperature (F): 97.9 ?Ambulatory Status: Wheelchair ?Pulse (bpm): 57 ?Discharge Destination: Home ?Respiratory Rate (breaths/min): 18 ?Transportation: Private Auto ?Blood Pressure (mmHg): 122/75 ?Accompanied By: sister ?Schedule Follow-up Appointment: Yes ?Clinical Summary of Care: Patient Declined ?Electronic Signature(s) ?Signed: 12/15/2021 5:45:24 PM By: Levan Hurst RN, BSN ?Entered By: Levan Hurst on 12/15/2021 17:41:12 ?-------------------------------------------------------------------------------- ?Lower Extremity Assessment Details ?Patient Name: ?Date of Service: ?[name], [place] [name] 12/15/2021 1:00 PM ?Medical Record Number: [id number] ?Patient Account Number: [id number] ?Date of Birth/Sex: ?Treating RN: ?September 14, 1950 (71 y.o. [name] ?Primary Care [name]: [name] ?Other Clinician: ?Referring Lam Mccubbins: ?Treating Dorothea Yow/Extender: [name] ?[name] ?Weeks in Treatment: 1 ?Edema Assessment ?Assessed: [Left: No] [Right: No] ?E[Left: dema] [Right: :] ?Calf ?Left: Right: ?Point of Measurement: From Medial Instep 32 cm ?Ankle ?Left: Right: ?Point of Measurement: From Medial Instep 23 cm ?Vascular Assessment ?Pulses: ?Dorsalis Pedis ?Palpable: [Right:Yes] ?Electronic Signature(s) ?Signed: 12/15/2021 5:45:24 PM By: Levan Hurst RN, BSN ?Entered By: Levan Hurst on 12/15/2021 13:32:19 ?-------------------------------------------------------------------------------- ?Multi Wound Chart Details ?Patient Name: ?Date of Service: ?[name], [place] [name] 12/15/2021 1:00 PM ?Medical Record Number: [id number] ?Patient Account Number: [id number] ?Date of Birth/Sex: ?Treating RN: ?1950/10/27 (71 y.o. [name]) Dellie Catholic ?Primary Care Rian Busche: [name] ?Other Clinician: ?Referring Sareen Randon: ?Treating Cana Mignano/Extender: [name] ?[name] ?Weeks in Treatment: 1 ?Vital Signs ?Height(in): ?Pulse(bpm):  57 ?Weight(lbs): ?Blood Pressure(mmHg): 122/75 ?Body Mass Index(BMI): ?Temperature(??F): 97.9 ?Respiratory Rate(breaths/min): 18 ?Photos: [1:Right Calcaneus] [N/A:N/A N/A] ?Wound Location: [1:Pressure Injury] [N/A:N/A] ?Wounding Event: [1:Pressure Ulcer] [N/A:N/A] ?Primary Etiology: [1:Cataracts, Hypertension, Cirrhosis , N/A] ?Comorbid History: [1:Hepatitis C 07/02/2020] [N/A:N/A] ?Date Acquired: [1:1] [N/A:N/A] ?Weeks of Treatment: [1:Open] [N/A:N/A] ?Wound Status: [1:No] [N/A:N/A] ?Wound Recurrence: [1:3x3.4x1.6] [N/A:N/A] ?Measurements L x W x D (cm) [1:8.011] [N/A:N/A] ?A (cm?) : ?rea [1:12.818] [N/A:N/A] ?Volume (cm?) : [1:67.60%] [N/A:N/A] ?% Reduction in A [1:rea: -3.60%] [N/A:N/A] ?% Reduction in Volume: [1:12] ?Starting Position 1 (o'clock): [1:7] ?Ending Position 1 (  o'clock): [1:1.7] ?Maximum Distance 1 (cm): [1:Yes] [N/A:N/A] ?Undermining: [1:Unstageable/Unclassified] [N/A:N/A] ?Classification: [1:Large] [N/A:N/A] ?Exudate A mount: [1:Serosanguineous] [N/A:N/A] ?Exudate Type: [1:red, brown] [N/A:N/A] ?Exudate Color: [1:Well defined, not attached] [N/A:N/A] ?Wound Margin: [1:Small (1-33%)] [N/A:N/A] ?Granulation A mount: [1:Red, Pink] [N/A:N/A] ?Granulation Quality: [1:Large (67-100%)] [N/A:N/A] ?Necrotic A mount: [1:Eschar, Adherent Slough] [N/A:N/A] ?Necrotic Tissue: ?[1:Fat Layer (Subcutaneous Tissue): Yes N/A] ?Exposed Structures: ?[1:Bone: Yes Fascia: No Tendon: No Muscle: No Joint: No None] [N/A:N/A] ?Epithelialization: [1:Debridement - Excisional] [N/A:N/A] ?Debridement: ?Pre-procedure Verification/Time Out 13:43 [N/A:N/A] ?Taken: [1:Necrotic/Eschar, Subcutaneous,] [N/A:N/A] ?Tissue Debrided: [1:Slough Skin/Subcutaneous Tissue] [N/A:N/A] ?Level: [1:10.2] [N/A:N/A] ?Debridement A (sq cm): [1:rea Curette] [N/A:N/A] ?Instrument: [1:Moderate] [N/A:N/A] ?Bleeding: [1:Pressure] [N/A:N/A] ?Hemostasis Achieved: [1:0] [N/A:N/A] ?Procedural Pain: [1:0] [N/A:N/A] ?Post Procedural Pain: ?Debridement Treatment  Response: Procedure was tolerated well [N/A:N/A] ?Post Debridement Measurements L x 3x3.4x1.6 [N/A:N/A] ?W x D (cm) [1:12.818] [N/A:N/A] ?Post Debridement Volume: (cm?) [1:Unstageable/Unclassified] [N/A:N/A] ?Post Debridement Stage: [1:Debridement] [N/A:N/A] ?Treatment Notes ?Electronic Signature(s) ?Signed: 12/15/2021 1:55:25 PM By: [name] MD FACS ?Signed: 12/15/2021 5:31:41 PM By: Dellie Catholic RN ?Entered By: [name] on 12/15/2021 13:55:25 ?-------------------------------------------------------------------------------- ?Multi-Disciplinary Care Plan Details ?Patient Name: ?Date of Service: ?[name], [place] [name] 12/15/2021 1:00 PM ?Medical Record Number: [id number] ?Patient Account Number: [id number] ?Date of Birth/Sex: ?Treating RN: ?1951-03-26 (71 y.o. [name] ?Primary Care Shiv Shuey: [name] ?Other Clinician: ?Referring Hisae Decoursey: ?Treating Dynasty Holquin/Extender: [name] ?[name] ?Weeks in Treatment: 1 ?Active Inactive ?Abuse / Safety / Falls / Self Care Management ?Nursing Diagnoses: ?History of Falls ?Impaired physical mobility ?Potential for falls ?Potential for injury related to falls ?Goals: ?Patient will remain injury free related to falls ?Date Initiated: 12/08/2021 ?Target Resolution Date: 01/05/2022 ?Goal Status: Active ?Patient/caregiver will verbalize understanding of skin care regimen ?Date Initiated: 12/08/2021 ?Target Resolution Date: 01/06/2022 ?Goal Status: Active ?Interventions: ?Assess fall risk on admission and as needed ?Assess: immobility, friction, shearing, incontinence upon admission and as needed ?Assess impairment of mobility on admission and as needed per policy ?Provide education on fall prevention ?Notes: ?Pressure ?Nursing Diagnoses: ?Knowledge deficit related to causes and risk factors for pressure ulcer development ?Potential for impaired tissue integrity related to pressure, friction, moisture, and shear ?Goals: ?Patient/caregiver will  verbalize risk factors for pressure ulcer development ?Date Initiated: 12/08/2021 ?Target Resolution Date: 01/05/2022 ?Goal Status: Active ?Patient/caregiver will verbalize understanding of pressure ulcer management ?Date Ini

## 2021-12-22 ENCOUNTER — Encounter (HOSPITAL_BASED_OUTPATIENT_CLINIC_OR_DEPARTMENT_OTHER): Payer: 59 | Admitting: General Surgery

## 2021-12-22 DIAGNOSIS — L89610 Pressure ulcer of right heel, unstageable: Secondary | ICD-10-CM | POA: Diagnosis not present
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
## 2021-12-23 NOTE — Progress Notes (Signed)
[name], [name] ([id number]) Visit Report for 12/22/2021 Arrival Information Details Patient Name: Date of Service: [name], [name] 12/22/2021 2:00 PM Medical Record Number: [id number] Patient Account Number: [id number] Date of Birth/Sex: Treating RN: October 15, 1950 (71 y.o. [name] Primary Care [name]: [name] Other Clinician: Referring [name]: Treating [name]/Extender: [name] in Treatment: 2 Visit Information History Since Last Visit Added or deleted any medications: No Patient Arrived: Wheel Chair Any new allergies or adverse reactions: No Arrival Time: 14:03 Had a fall or experienced change in No Accompanied By: sister activities of daily living that may affect Transfer Assistance: Manual risk of falls: Patient Identification Verified: Yes Signs or symptoms of abuse/neglect since last visito No Secondary Verification Process Completed: Yes Hospitalized since last visit: No Patient Requires Transmission-Based Precautions: No Implantable device outside of the clinic excluding No Patient Has Alerts: Yes cellular tissue based products placed in the center Patient Alerts: Patient on Blood Thinner since last visit: Has Dressing in Place as Prescribed: Yes Pain Present Now: No Electronic Signature(s) Signed: 12/23/2021 5:57:27 PM By: Micheal Hurst RN, BSN Entered By: [name] on 12/22/2021 14:05:04 -------------------------------------------------------------------------------- Encounter Discharge Information Details Patient Name: Date of Service: [name], [name] 12/22/2021 2:00 PM Medical Record Number: [id number] Patient Account Number: [id number] Date of Birth/Sex: Treating RN: July 11, 1951 (71 y.o. [name] Primary Care [name]: [name] Other Clinician: Referring [name]: Treating [name]/Extender: [name] in Treatment: 2 Encounter Discharge Information  Items Post Procedure Vitals Discharge Condition: Stable Temperature (F): 98.0 Ambulatory Status: Wheelchair Pulse (bpm): 68 Discharge Destination: Home Respiratory Rate (breaths/min): 18 Transportation: Private Auto Blood Pressure (mmHg): 153/82 Accompanied By: sister Schedule Follow-up Appointment: Yes Clinical Summary of Care: Patient Declined Electronic Signature(s) Signed: 12/23/2021 5:57:27 PM By: Micheal Hurst RN, BSN Entered By: [name] on 12/22/2021 17:45:04 -------------------------------------------------------------------------------- Lower Extremity Assessment Details Patient Name: Date of Service: [name], [place] [name] 12/22/2021 2:00 PM Medical Record Number: [id number] Patient Account Number: [id number] Date of Birth/Sex: Treating RN: 1950/10/20 (71 y.o. [name] Primary Care Sandy Blouch: [name] Other Clinician: Referring Shanetta Nicolls: Treating [name]/Extender: [name] in Treatment: 2 [name]: No] [name]: No] Edema: [Left: N] [Right: o] Calf Left: Right: Point of Measurement: From Medial Instep 32 cm Ankle Left: Right: Point of Measurement: From Medial Instep 23 cm Vascular Assessment Pulses: Dorsalis Pedis Palpable: [Right:Yes] Electronic Signature(s) Signed: 12/23/2021 5:57:27 PM By: Micheal Hurst RN, BSN Entered By: [name] on 12/22/2021 14:17:29 -------------------------------------------------------------------------------- Multi Wound Chart Details Patient Name: Date of Service: [name], [name] 12/22/2021 2:00 PM Medical Record Number: [id number] Patient Account Number: [id number] Date of Birth/Sex: Treating RN: 1951-01-24 (71 y.o. [name] Primary Care Yorel Redder: [name] Other Clinician: Referring [name]: Treating [name]/Extender: [name] in Treatment: 2 Vital Signs Height(in): Pulse(bpm):  97 Weight(lbs): Blood Pressure(mmHg): 153/82 Body Mass Index(BMI): Temperature(F): 98.0 Respiratory Rate(breaths/min): 18 Photos: [1:Right Calcaneus] [N/A:N/A N/A] Wound Location: [1:Pressure Injury] [N/A:N/A] Wounding Event: [1:Pressure Ulcer] [N/A:N/A] Primary Etiology: [1:Cataracts, Hypertension, Cirrhosis , N/A] Comorbid History: [1:Hepatitis C 07/02/2020] [N/A:N/A] Date Acquired: [1:2] [N/A:N/A] Weeks of Treatment: [1:Open] [N/A:N/A] Wound Status: [1:No] [N/A:N/A] Wound Recurrence: [1:2.6x3.2x1.7] [N/A:N/A] Measurements L x W x D (cm) [1:6.535] [N/A:N/A] A (cm) : rea [1:11.109] [N/A:N/A] Volume (cm) : [1:73.60%] [N/A:N/A] % Reduction in A [1:rea: 10.20%] [N/A:N/A] % Reduction in Volume: [1:1] Starting Position 1 (o'clock): [1:7] Ending Position  1 (o'clock): [1:1.2] Maximum Distance 1 (cm): [1:Yes] [N/A:N/A] Undermining: [1:Unstageable/Unclassified] [N/A:N/A] Classification: [1:Large] [N/A:N/A] Exudate A mount: [1:Serosanguineous] [N/A:N/A] Exudate Type: [1:red, brown] [N/A:N/A] Exudate Color: [1:Well defined, not attached] [N/A:N/A] Wound Margin: [1:Medium (34-66%)] [N/A:N/A] Granulation A mount: [1:Red, Pink] [N/A:N/A] Granulation Quality: [1:Medium (34-66%)] [N/A:N/A] Necrotic A mount: [1:Fat Layer (Subcutaneous Tissue): Yes N/A] Exposed Structures: [1:Bone: Yes Fascia: No Tendon: No Muscle: No Joint: No Small (1-33%)] [N/A:N/A] Epithelialization: [1:Debridement - Excisional] [N/A:N/A] Debridement: Pre-procedure Verification/Time Out 14:57 [N/A:N/A] Taken: [1:Subcutaneous, Slough] [N/A:N/A] Tissue Debrided: [1:Skin/Subcutaneous Tissue] [N/A:N/A] Level: [1:8.32] [N/A:N/A] Debridement A (sq cm): [1:rea Curette] [N/A:N/A] Instrument: [1:Minimum] [N/A:N/A] Bleeding: [1:Pressure] [N/A:N/A] Hemostasis A chieved: [1:0] [N/A:N/A] Procedural Pain: [1:0] [N/A:N/A] Post Procedural Pain: [1:Procedure was tolerated well] [N/A:N/A] Debridement Treatment Response:  [1:2.6x3.2x1.7] [N/A:N/A] Post Debridement Measurements L x W x D (cm) [1:11.109] [N/A:N/A] Post Debridement Volume: (cm) [1:Unstageable/Unclassified] [N/A:N/A] Post Debridement Stage: [1:Debridement] [N/A:N/A] Treatment Notes Electronic Signature(s) Signed: 12/22/2021 4:30:31 PM By: Micheal Maudlin MD FACS Signed: 12/23/2021 5:57:27 PM By: Micheal Hurst RN, BSN Entered By: [name] on 12/22/2021 16:30:31 -------------------------------------------------------------------------------- Multi-Disciplinary Care Plan Details Patient Name: Date of Service: [name], [name] 12/22/2021 2:00 PM Medical Record Number: [id number] Patient Account Number: [id number] Date of Birth/Sex: Treating RN: 1951-07-13 (71 y.o. [name] Primary Care Kaleigh Spiegelman: [name] Other Clinician: Referring Jina Olenick: Treating Zadiel Leyh/Extender: [name] in Treatment: 2 Active Inactive Abuse / Safety / Falls / Self Care Management Nursing Diagnoses: History of Falls Impaired physical mobility Potential for falls Potential for injury related to falls Goals: Patient will remain injury free related to falls Date Initiated: 12/08/2021 Target Resolution Date: 01/05/2022 Goal Status: Active Patient/caregiver will verbalize understanding of skin care regimen Date Initiated: 12/08/2021 Target Resolution Date: 01/06/2022 Goal Status: Active Interventions: Assess fall risk on admission and as needed Assess: immobility, friction, shearing, incontinence upon admission and as needed Assess impairment of mobility on admission and as needed per policy Provide education on fall prevention Notes: Pressure Nursing Diagnoses: Knowledge deficit related to causes and risk factors for pressure ulcer development Potential for impaired tissue integrity related to pressure, friction, moisture, and shear Goals: Patient/caregiver will verbalize risk factors for pressure ulcer  development Date Initiated: 12/08/2021 Target Resolution Date: 01/05/2022 Goal Status: Active Patient/caregiver will verbalize understanding of pressure ulcer management Date Initiated: 12/08/2021 Target Resolution Date: 01/05/2022 Goal Status: Active Interventions: Assess: immobility, friction, shearing, incontinence upon admission and as needed Assess offloading mechanisms upon admission and as needed Assess potential for pressure ulcer upon admission and as needed Provide education on pressure ulcers Notes: Wound/Skin Impairment Nursing Diagnoses: Impaired tissue integrity Knowledge deficit related to ulceration/compromised skin integrity Goals: Patient/caregiver will verbalize understanding of skin care regimen Date Initiated: 12/08/2021 Target Resolution Date: 01/05/2022 Goal Status: Active Ulcer/skin breakdown will have a volume reduction of 30% by week 4 Date Initiated: 12/08/2021 Target Resolution Date: 01/05/2022 Goal Status: Active Interventions: Assess patient/caregiver ability to perform ulcer/skin care regimen upon admission and as needed Assess ulceration(s) every visit Provide education on ulcer and skin care Treatment Activities: Skin care regimen initiated : 12/08/2021 Topical wound management initiated : 12/08/2021 Notes: Electronic Signature(s) Signed: 12/23/2021 5:57:27 PM By: Micheal Hurst RN, BSN Entered By: [name] on 12/22/2021 17:43:56 -------------------------------------------------------------------------------- Pain Assessment Details Patient Name: Date of Service: [name], [name] 12/22/2021 2:00 PM Medical Record Number: [id number] Patient Account Number: [id number] Date of Birth/Sex: Treating RN: Apr 28, 1951 (71 y.o. [name] Primary Care Fleur Audino: [name] Other Clinician:  Referring Rachel Samples: Treating Lakeeta Dobosz/Extender: [name] in Treatment: 2 Active Problems Location of Pain Severity and  Description of Pain Patient Has Paino No Site Locations Pain Management and Medication Current Pain Management: Electronic Signature(s) Signed: 12/23/2021 5:57:27 PM By: Micheal Hurst RN, BSN Entered By: [name] on 12/22/2021 14:05:24 -------------------------------------------------------------------------------- Patient/Caregiver Education Details Patient Name: Date of Service: [name] N 5/23/2023andnbsp2:00 PM Medical Record Number: [id number] Patient Account Number: [id number] Date of Birth/Gender: Treating RN: [birth year] (72 y.o. [name] Primary Care Physician: [name] Other Clinician: Referring Physician: Treating Physician/Extender: [name] in Treatment: 2 Education Assessment Education Provided To: Patient Education Topics Provided Wound/Skin Impairment: Methods: Explain/Verbal Responses: State content correctly Electronic Signature(s) Signed: 12/23/2021 5:57:27 PM By: Micheal Hurst RN, BSN Entered By: [name] on 12/22/2021 17:44:07 -------------------------------------------------------------------------------- Wound Assessment Details Patient Name: Date of Service: [name], [name] 12/22/2021 2:00 PM Medical Record Number: [id number] Patient Account Number: [id number] Date of Birth/Sex: Treating RN: [birth year] (71 y.o. [name] Primary Care Clayvon Parlett: [name] Other Clinician: Referring Yvanna Vidas: Treating Hadrian Yarbrough/Extender: [name] in Treatment: 2 Wound Status Wound Number: 1 Primary Etiology: Pressure Ulcer Wound Location: Right Calcaneus Wound Status: Open Wounding Event: Pressure Injury Comorbid History: Cataracts, Hypertension, Cirrhosis , Hepatitis C Date Acquired: 07/02/2020 Weeks Of Treatment: 2 Clustered Wound: No Photos Wound Measurements Length: (cm) 2.6 Width: (cm) 3.2 Depth: (cm) 1.7 Area: (cm) 6.535 Volume:  (cm) 11.109 % Reduction in Area: 73.6% % Reduction in Volume: 10.2% Epithelialization: Small (1-33%) Tunneling: No Undermining: Yes Starting Position (o'clock): 1 Ending Position (o'clock): 7 Maximum Distance: (cm) 1.2 Wound Description Classification: Unstageable/Unclassified Wound Margin: Well defined, not attached Exudate Amount: Large Exudate Type: Serosanguineous Exudate Color: red, brown Foul Odor After Cleansing: No Slough/Fibrino Yes Wound Bed Granulation Amount: Medium (34-66%) Exposed Structure Granulation Quality: Red, Pink Fascia Exposed: No Necrotic Amount: Medium (34-66%) Fat Layer (Subcutaneous Tissue) Exposed: Yes Necrotic Quality: Adherent Slough Tendon Exposed: No Muscle Exposed: No Joint Exposed: No Bone Exposed: Yes Treatment Notes Wound #1 (Calcaneus) Wound Laterality: Right Cleanser Soap and Water Discharge Instruction: May shower and wash wound with dial antibacterial soap and water prior to dressing change. Wound Cleanser Discharge Instruction: Cleanse the wound with wound cleanser prior to applying a clean dressing using gauze sponges, not tissue or cotton balls. Peri-Wound Care Zinc Oxide Ointment 30g tube Discharge Instruction: Apply Zinc Oxide to periwound with each dressing change Topical Keystone antibiotic compound Discharge Instruction: Apply directly to wound bed once available Primary Dressing Hydrofera Blue Classic Foam, 4x4 in Discharge Instruction: Moisten with saline prior to applying to wound bed Secondary Dressing ALLEVYN Heel 4 1/2in x 5 1/2in / 10.5cm x 13.5cm Discharge Instruction: Apply over primary dressing as directed. Woven Gauze Sponge, Non-Sterile 4x4 in Discharge Instruction: Apply over primary dressing as directed. Secured With [employer], 4.5x3.1 (in/yd) Discharge Instruction: Secure with Kerlix as directed. 20M Medipore Soft Cloth Surgical T 2x10 (in/yd) ape Discharge Instruction: Secure with tape as  directed. Compression Wrap Compression Stockings Add-Ons Electronic Signature(s) Signed: 12/23/2021 5:57:27 PM By: Micheal Hurst RN, BSN Entered By: [name] on 12/22/2021 14:16:51 -------------------------------------------------------------------------------- [place] Details Patient Name: Date of Service: [name], [name] 12/22/2021 2:00 PM Medical Record Number: [id number] Patient Account Number: [id number] Date of Birth/Sex: Treating RN: 09/07/1950 (71 y.o. [name] Primary Care Yamilex Borgwardt: [name] Other Clinician: Referring Yazlin Ekblad: Treating Emitt Maglione/Extender: Marliss Coots  Weeks in Treatment: 2 Vital Signs Time Taken: 14:02 Temperature (F): 98.0 Pulse (bpm): 68 Respiratory Rate (breaths/min): 18 Blood Pressure (mmHg): 153/82 Reference Range: 80 - 120 mg / dl Electronic Signature(s) Signed: 12/23/2021 5:57:27 PM By: Micheal Hurst RN, BSN Entered By: [name] on 12/22/2021 14:05:19

## 2021-12-30 ENCOUNTER — Encounter (HOSPITAL_BASED_OUTPATIENT_CLINIC_OR_DEPARTMENT_OTHER): Payer: 59 | Admitting: General Surgery

## 2021-12-30 DIAGNOSIS — L89610 Pressure ulcer of right heel, unstageable: Secondary | ICD-10-CM | POA: Diagnosis not present

## 2021-12-31 NOTE — Progress Notes (Signed)
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 Unspecified cirrhosis of liver Quantity: 1 : [id number] - WC PHYS SUBQ TISS  20 SQ CM ICD-10 Diagnosis Description L89.610 Pressure ulcer of right heel, unstageable Quantity: 1 Electronic Signature(s) Signed: 12/30/2021 1:49:44 PM By: [name] MD FACS Entered By: [name] on 12/30/2021 13:49:44

## 2021-12-31 NOTE — Progress Notes (Signed)
AN, SCHNABEL ([id number]) Visit Report for 12/30/2021 Arrival Information Details Patient Name: Date of Service: [name], [name] 12/30/2021 1:00 PM Medical Record Number: [id number] Patient Account Number: [id number] Date of Birth/Sex: Treating RN: February 25, 1951 (71 y.o. [name] Primary Care Kaithlyn Teagle: [name] Other Clinician: Referring Kamari Bilek: Treating Shaheer Bonfield/Extender: Milagros Evener in Treatment: 3 Visit Information History Since Last Visit Added or deleted any medications: No Patient Arrived: Gilford Rile Any new allergies or adverse reactions: No Arrival Time: 13:07 Had a fall or experienced change in No Accompanied By: sister activities of daily living that may affect Transfer Assistance: None risk of falls: Patient Identification Verified: Yes Signs or symptoms of abuse/neglect since last visito No Secondary Verification Process Completed: Yes Hospitalized since last visit: No Patient Requires Transmission-Based Precautions: No Implantable device outside of the clinic excluding No Patient Has Alerts: Yes cellular tissue based products placed in the center Patient Alerts: Patient on Blood Thinner since last visit: Has Dressing in Place as Prescribed: Yes Pain Present Now: No Electronic Signature(s) Signed: 12/31/2021 6:12:29 PM By: Levan Hurst RN, BSN Entered By: Levan Hurst on 12/30/2021 13:08:16 -------------------------------------------------------------------------------- Encounter Discharge Information Details Patient Name: Date of Service: [name], [name] 12/30/2021 1:00 PM Medical Record Number: [id number] Patient Account Number: [id number] Date of Birth/Sex: Treating RN: 01-Aug-1951 (71 y.o. [name] Primary Care Arthuro Canelo: [name] Other Clinician: Referring Rickie Gange: Treating Emmaus Brandi/Extender: Milagros Evener in Treatment: 3 Encounter Discharge Information Items  Post Procedure Vitals Discharge Condition: Stable Temperature (F): 98.2 Ambulatory Status: Wheelchair Pulse (bpm): 56 Discharge Destination: Home Respiratory Rate (breaths/min): 18 Transportation: Private Auto Blood Pressure (mmHg): 120/74 Accompanied By: sister Schedule Follow-up Appointment: Yes Clinical Summary of Care: Patient Declined Electronic Signature(s) Signed: 12/31/2021 6:12:29 PM By: Levan Hurst RN, BSN Entered By: Levan Hurst on 12/31/2021 17:59:03 -------------------------------------------------------------------------------- Lower Extremity Assessment Details Patient Name: Date of Service: [name], [name] 12/30/2021 1:00 PM Medical Record Number: [id number] Patient Account Number: [id number] Date of Birth/Sex: Treating RN: 1950/10/20 (71 y.o. [name] Primary Care Alyaan Budzynski: [name] Other Clinician: Referring Eliazar Olivar: Treating Rossie Bretado/Extender: Milagros Evener in Treatment: 3 Edema Assessment Assessed: Shirlyn Goltz: No] [name]: No] Edema: [Left: N] [Right: o] Calf Left: Right: Point of Measurement: From Medial Instep 32 cm Ankle Left: Right: Point of Measurement: From Medial Instep 23 cm Vascular Assessment Pulses: Dorsalis Pedis Palpable: [Right:Yes] Electronic Signature(s) Signed: 12/31/2021 6:12:29 PM By: Levan Hurst RN, BSN Entered By: Levan Hurst on 12/30/2021 13:09:02 -------------------------------------------------------------------------------- Multi Wound Chart Details Patient Name: Date of Service: [name], [name] 12/30/2021 1:00 PM Medical Record Number: [id number] Patient Account Number: [id number] Date of Birth/Sex: Treating RN: May 16, 1951 (71 y.o. [name] Primary Care Adolphe Fortunato: [name] Other Clinician: Referring Victor Langenbach: Treating Alyssia Heese/Extender: Milagros Evener in Treatment: 3 Vital Signs Height(in): Pulse(bpm):  56 Weight(lbs): Blood Pressure(mmHg): 120/74 Body Mass Index(BMI): Temperature(F): 98.2 Respiratory Rate(breaths/min): 18 Photos: [1:Right Calcaneus] [N/A:N/A N/A] Wound Location: [1:Pressure Injury] [N/A:N/A] Wounding Event: [1:Pressure Ulcer] [N/A:N/A] Primary Etiology: [1:Cataracts, Hypertension, Cirrhosis , N/A] Comorbid History: [1:Hepatitis C 07/02/2020] [N/A:N/A] Date Acquired: [1:3] [N/A:N/A] Weeks of Treatment: [1:Open] [N/A:N/A] Wound Status: [1:No] [N/A:N/A] Wound Recurrence: [1:2.1x3.2x1.6] [N/A:N/A] Measurements L x W x D (cm) [1:5.278] [N/A:N/A] A (cm) : rea [1:8.445] [N/A:N/A] Volume (cm) : [1:78.70%] [N/A:N/A] % Reduction in A [1:rea: 31.70%] [N/A:N/A] % Reduction in Volume: [1:12] Starting Position 1 (o'clock): [1:7] Ending Position 1 (  o'clock): [1:1.5] Maximum Distance 1 (cm): [1:Yes] [N/A:N/A] Undermining: [1:Category/Stage IV] [N/A:N/A] Classification: [1:Medium] [N/A:N/A] Exudate A mount: [1:Serosanguineous] [N/A:N/A] Exudate Type: [1:red, brown] [N/A:N/A] Exudate Color: [1:Well defined, not attached] [N/A:N/A] Wound Margin: [1:Medium (34-66%)] [N/A:N/A] Granulation A mount: [1:Red, Pink] [N/A:N/A] Granulation Quality: [1:Medium (34-66%)] [N/A:N/A] Necrotic A mount: [1:Fat Layer (Subcutaneous Tissue): Yes N/A] Exposed Structures: [1:Bone: Yes Fascia: No Tendon: No Muscle: No Joint: No Small (1-33%)] [N/A:N/A] Epithelialization: [1:Debridement - Excisional] [N/A:N/A] Debridement: Pre-procedure Verification/Time Out 13:25 [N/A:N/A] Taken: [1:Fat, Callus, Subcutaneous, Slough] [N/A:N/A] Tissue Debrided: [1:Skin/Subcutaneous Tissue] [N/A:N/A] Level: [1:6.72] [N/A:N/A] Debridement A (sq cm): [1:rea Curette] [N/A:N/A] Instrument: [1:Minimum] [N/A:N/A] Bleeding: [1:Pressure] [N/A:N/A] Hemostasis A chieved: [1:0] [N/A:N/A] Procedural Pain: [1:0] [N/A:N/A] Post Procedural Pain: [1:Procedure was tolerated well] [N/A:N/A] Debridement Treatment Response:  [1:2.1x3.2x1.6] [N/A:N/A] Post Debridement Measurements L x W x D (cm) [1:8.445] [N/A:N/A] Post Debridement Volume: (cm) [1:Unstageable/Unclassified] [N/A:N/A] Post Debridement Stage: [1:Debridement] [N/A:N/A] Treatment Notes Electronic Signature(s) Signed: 12/31/2021 6:12:29 PM By: Levan Hurst RN, BSN Previous Signature: 12/30/2021 1:42:37 PM Version By: [name] MD FACS Entered By: Levan Hurst on 12/30/2021 14:12:44 -------------------------------------------------------------------------------- Multi-Disciplinary Care Plan Details Patient Name: Date of Service: [name], [name] 12/30/2021 1:00 PM Medical Record Number: [id number] Patient Account Number: [id number] Date of Birth/Sex: Treating RN: 1950/08/16 (71 y.o. [name] Primary Care Ahlia Lemanski: [name] Other Clinician: Referring Sherlyn Ebbert: Treating Kjersten Ormiston/Extender: Milagros Evener in Treatment: 3 Active Inactive Abuse / Safety / Falls / Self Care Management Nursing Diagnoses: History of Falls Impaired physical mobility Potential for falls Potential for injury related to falls Goals: Patient will remain injury free related to falls Date Initiated: 12/08/2021 Target Resolution Date: 01/05/2022 Goal Status: Active Patient/caregiver will verbalize understanding of skin care regimen Date Initiated: 12/08/2021 Target Resolution Date: 01/06/2022 Goal Status: Active Interventions: Assess fall risk on admission and as needed Assess: immobility, friction, shearing, incontinence upon admission and as needed Assess impairment of mobility on admission and as needed per policy Provide education on fall prevention Notes: Pressure Nursing Diagnoses: Knowledge deficit related to causes and risk factors for pressure ulcer development Potential for impaired tissue integrity related to pressure, friction, moisture, and shear Goals: Patient/caregiver will verbalize risk factors for  pressure ulcer development Date Initiated: 12/08/2021 Target Resolution Date: 01/05/2022 Goal Status: Active Patient/caregiver will verbalize understanding of pressure ulcer management Date Initiated: 12/08/2021 Target Resolution Date: 01/05/2022 Goal Status: Active Interventions: Assess: immobility, friction, shearing, incontinence upon admission and as needed Assess offloading mechanisms upon admission and as needed Assess potential for pressure ulcer upon admission and as needed Provide education on pressure ulcers Notes: Wound/Skin Impairment Nursing Diagnoses: Impaired tissue integrity Knowledge deficit related to ulceration/compromised skin integrity Goals: Patient/caregiver will verbalize understanding of skin care regimen Date Initiated: 12/08/2021 Target Resolution Date: 01/05/2022 Goal Status: Active Ulcer/skin breakdown will have a volume reduction of 30% by week 4 Date Initiated: 12/08/2021 Target Resolution Date: 01/05/2022 Goal Status: Active Interventions: Assess patient/caregiver ability to perform ulcer/skin care regimen upon admission and as needed Assess ulceration(s) every visit Provide education on ulcer and skin care Treatment Activities: Skin care regimen initiated : 12/08/2021 Topical wound management initiated : 12/08/2021 Notes: Electronic Signature(s) Signed: 12/31/2021 6:12:29 PM By: Levan Hurst RN, BSN Entered By: Levan Hurst on 12/30/2021 13:09:58 -------------------------------------------------------------------------------- Pain Assessment Details Patient Name: Date of Service: [name], [name] 12/30/2021 1:00 PM Medical Record Number: [id number] Patient Account Number: [id number] Date of Birth/Sex: Treating RN: 04-Jul-1951 (71 y.o. [name] Primary Care Daisia Slomski:  [name] Other Clinician: Referring Ladora Osterberg: Treating Blaise Grieshaber/Extender: Milagros Evener in Treatment: 3 Active Problems Location of Pain  Severity and Description of Pain Patient Has Paino No Site Locations Pain Management and Medication Current Pain Management: Electronic Signature(s) Signed: 12/31/2021 6:12:29 PM By: Levan Hurst RN, BSN Entered By: Levan Hurst on 12/30/2021 13:08:49 -------------------------------------------------------------------------------- Patient/Caregiver Education Details Patient Name: Date of Service: [name] N 5/31/2023andnbsp1:00 PM Medical Record Number: [id number] Patient Account Number: [id number] Date of Birth/Gender: Treating RN: July 16, 1951 (71 y.o. [name] Primary Care Physician: [name] Other Clinician: Referring Physician: Treating Physician/Extender: Milagros Evener in Treatment: 3 Education Assessment Education Provided To: Patient Education Topics Provided Wound/Skin Impairment: Methods: Explain/Verbal Responses: State content correctly Electronic Signature(s) Signed: 12/31/2021 6:12:29 PM By: Levan Hurst RN, BSN Entered By: Levan Hurst on 12/30/2021 13:12:32 -------------------------------------------------------------------------------- Wound Assessment Details Patient Name: Date of Service: [name], [name] 12/30/2021 1:00 PM Medical Record Number: [id number] Patient Account Number: [id number] Date of Birth/Sex: Treating RN: 06/15/1951 (71 y.o. [name] Primary Care Jomayra Novitsky: [name] Other Clinician: Referring Dia Donate: Treating Carmelia Tiner/Extender: Milagros Evener in Treatment: 3 Wound Status Wound Number: 1 Primary Etiology: Pressure Ulcer Wound Location: Right Calcaneus Wound Status: Open Wounding Event: Pressure Injury Comorbid History: Cataracts, Hypertension, Cirrhosis , Hepatitis C Date Acquired: 07/02/2020 Weeks Of Treatment: 3 Clustered Wound: No Photos Wound Measurements Length: (cm) 2.1 Width: (cm) 3.2 Depth: (cm) 1.6 Area: (cm)  5.278 Volume: (cm) 8.445 % Reduction in Area: 78.7% % Reduction in Volume: 31.7% Epithelialization: Small (1-33%) Tunneling: No Undermining: Yes Starting Position (o'clock): 12 Ending Position (o'clock): 7 Maximum Distance: (cm) 1.5 Wound Description Classification: Category/Stage IV Wound Margin: Well defined, not attached Exudate Amount: Medium Exudate Type: Serosanguineous Exudate Color: red, brown Foul Odor After Cleansing: No Slough/Fibrino Yes Wound Bed Granulation Amount: Medium (34-66%) Exposed Structure Granulation Quality: Red, Pink Fascia Exposed: No Necrotic Amount: Medium (34-66%) Fat Layer (Subcutaneous Tissue) Exposed: Yes Necrotic Quality: Adherent Slough Tendon Exposed: No Muscle Exposed: No Joint Exposed: No Bone Exposed: Yes Treatment Notes Wound #1 (Calcaneus) Wound Laterality: Right Cleanser Soap and Water Discharge Instruction: May shower and wash wound with dial antibacterial soap and water prior to dressing change. Wound Cleanser Discharge Instruction: Cleanse the wound with wound cleanser prior to applying a clean dressing using gauze sponges, not tissue or cotton balls. Peri-Wound Care Zinc Oxide Ointment 30g tube Discharge Instruction: Apply Zinc Oxide to periwound with each dressing change Topical Keystone antibiotic compound Discharge Instruction: Apply directly to wound bed once available Primary Dressing Hydrofera Blue Classic Foam, 4x4 in Discharge Instruction: Moisten with saline prior to applying to wound bed Secondary Dressing ALLEVYN Heel 4 1/2in x 5 1/2in / 10.5cm x 13.5cm Discharge Instruction: Apply over primary dressing as directed. Woven Gauze Sponge, Non-Sterile 4x4 in Discharge Instruction: Apply over primary dressing as directed. Secured With [employer], 4.5x3.1 (in/yd) Discharge Instruction: Secure with Kerlix as directed. 69M Medipore Soft Cloth Surgical T 2x10 (in/yd) ape Discharge Instruction: Secure with  tape as directed. Compression Wrap Compression Stockings Add-Ons Electronic Signature(s) Signed: 12/31/2021 6:12:29 PM By: Levan Hurst RN, BSN Entered By: Levan Hurst on 12/30/2021 14:12:27 -------------------------------------------------------------------------------- Vitals Details Patient Name: Date of Service: [name], [name] 12/30/2021 1:00 PM Medical Record Number: [id number] Patient Account Number: [id number] Date of Birth/Sex: Treating RN: 07/18/1951 (71 y.o. [name] Primary Care Kameisha Malicki: [name] Other Clinician: Referring Monterrius Cardosa: Treating  Chandra Feger/Extender: Milagros Evener in Treatment: 3 Vital Signs Time Taken: 13:07 Temperature (F): 98.2 Pulse (bpm): 56 Respiratory Rate (breaths/min): 18 Blood Pressure (mmHg): 120/74 Reference Range: 80 - 120 mg / dl Electronic Signature(s) Signed: 12/31/2021 6:12:29 PM By: Levan Hurst RN, BSN Entered By: Levan Hurst on 12/30/2021 13:08:31

## 2022-01-06 ENCOUNTER — Ambulatory Visit (HOSPITAL_BASED_OUTPATIENT_CLINIC_OR_DEPARTMENT_OTHER): Payer: 59 | Admitting: General Surgery

## 2022-01-13 ENCOUNTER — Encounter (HOSPITAL_BASED_OUTPATIENT_CLINIC_OR_DEPARTMENT_OTHER): Payer: 59 | Attending: General Surgery | Admitting: General Surgery

## 2022-01-13 DIAGNOSIS — K7460 Unspecified cirrhosis of liver: Secondary | ICD-10-CM | POA: Insufficient documentation

## 2022-01-13 DIAGNOSIS — L89610 Pressure ulcer of right heel, unstageable: Secondary | ICD-10-CM | POA: Diagnosis present

## 2022-01-13 DIAGNOSIS — Z8616 Personal history of COVID-19: Secondary | ICD-10-CM | POA: Insufficient documentation

## 2022-01-13 DIAGNOSIS — Z8673 Personal history of transient ischemic attack (TIA), and cerebral infarction without residual deficits: Secondary | ICD-10-CM | POA: Diagnosis not present

## 2022-01-13 DIAGNOSIS — I10 Essential (primary) hypertension: Secondary | ICD-10-CM | POA: Insufficient documentation

## 2022-01-13 DIAGNOSIS — Z87891 Personal history of nicotine dependence: Secondary | ICD-10-CM | POA: Insufficient documentation

## 2022-01-13 DIAGNOSIS — B182 Chronic viral hepatitis C: Secondary | ICD-10-CM | POA: Insufficient documentation

## 2022-01-14 NOTE — Progress Notes (Signed)
[name], [name] ([id number]) Visit Report for 01/13/2022 Arrival Information Details Patient Name: Date of Service: [name] [place] 01/13/2022 12:30 PM Medical Record Number: [id number] Patient Account Number: [id number] Date of Birth/Sex: Treating RN: September 18, 1950 (71 y.o. [name] Primary Care Timmie Calix: [name] Other Clinician: Referring Jaslyn Bansal: Treating Marilouise Densmore/Extender: Milagros Evener in Treatment: 5 Visit Information History Since Last Visit Added or deleted any medications: No Patient Arrived: Wheel Chair Any new allergies or adverse reactions: No Arrival Time: 12:53 Had a fall or experienced change in No Accompanied By: sister activities of daily living that may affect Transfer Assistance: Manual risk of falls: Patient Identification Verified: Yes Signs or symptoms of abuse/neglect since last visito No Secondary Verification Process Completed: Yes Hospitalized since last visit: No Patient Requires Transmission-Based Precautions: No Implantable device outside of the clinic excluding No Patient Has Alerts: Yes cellular tissue based products placed in the center Patient Alerts: Patient on Blood Thinner since last visit: Has Dressing in Place as Prescribed: Yes Pain Present Now: No Electronic Signature(s) Signed: 01/13/2022 6:00:02 PM By: Levan Hurst RN, BSN Entered By: Levan Hurst on 01/13/2022 12:54:13 -------------------------------------------------------------------------------- Encounter Discharge Information Details Patient Name: Date of Service: [name], [name] 01/13/2022 12:30 PM Medical Record Number: [id number] Patient Account Number: [id number] Date of Birth/Sex: Treating RN: [birth year] (71 y.o. [name] Primary Care Czarina Gingras: [name] Other Clinician: Referring Nasira Janusz: Treating Esmeralda Blanford/Extender: Milagros Evener in Treatment: 5 Encounter Discharge  Information Items Post Procedure Vitals Discharge Condition: Stable Temperature (F): 97.9 Ambulatory Status: Wheelchair Pulse (bpm): 53 Discharge Destination: Home Respiratory Rate (breaths/min): 18 Transportation: Private Auto Blood Pressure (mmHg): 107/68 Accompanied By: alone Schedule Follow-up Appointment: Yes Clinical Summary of Care: Patient Declined Electronic Signature(s) Signed: 01/13/2022 6:00:02 PM By: Levan Hurst RN, BSN Entered By: Levan Hurst on 01/13/2022 15:38:24 -------------------------------------------------------------------------------- Lower Extremity Assessment Details Patient Name: Date of Service: [name], [name] 01/13/2022 12:30 PM Medical Record Number: [id number] Patient Account Number: [id number] Date of Birth/Sex: Treating RN: Sep 08, 1950 (71 y.o. [name] Primary Care Brityn Mastrogiovanni: [name] Other Clinician: Referring Krisalyn Yankowski: Treating Sparkles Mcneely/Extender: Milagros Evener in Treatment: 5 Edema Assessment Assessed: Shirlyn Goltz: No] [name]: No] Edema: [Left: N] [Right: o] Calf Left: Right: Point of Measurement: From Medial Instep 32 cm Ankle Left: Right: Point of Measurement: From Medial Instep 23 cm Vascular Assessment Pulses: Dorsalis Pedis Palpable: [Right:Yes] Electronic Signature(s) Signed: 01/13/2022 6:00:02 PM By: Levan Hurst RN, BSN Entered By: Levan Hurst on 01/13/2022 12:54:40 -------------------------------------------------------------------------------- Multi Wound Chart Details Patient Name: Date of Service: [name], [name] 01/13/2022 12:30 PM Medical Record Number: [id number] Patient Account Number: [id number] Date of Birth/Sex: Treating RN: Jul 23, 1951 (71 y.o. [name] Primary Care Virgin Zellers: [name] Other Clinician: Referring Galen Russman: Treating Sang Blount/Extender: Milagros Evener in Treatment: 5 Vital  Signs Height(in): Pulse(bpm): 53 Weight(lbs): Blood Pressure(mmHg): 107/68 Body Mass Index(BMI): Temperature(F): 97.9 Respiratory Rate(breaths/min): 18 Photos: [1:Right Calcaneus] [N/A:N/A N/A] Wound Location: [1:Pressure Injury] [N/A:N/A] Wounding Event: [1:Pressure Ulcer] [N/A:N/A] Primary Etiology: [1:Cataracts, Hypertension, Cirrhosis , N/A] Comorbid History: [1:Hepatitis C 07/02/2020] [N/A:N/A] Date Acquired: [1:5] [N/A:N/A] Weeks of Treatment: [1:Open] [N/A:N/A] Wound Status: [1:No] [N/A:N/A] Wound Recurrence: [1:1.9x2.4x1.2] [N/A:N/A] Measurements L x W x D (cm) [1:3.581] [N/A:N/A] A (cm) : rea [1:4.298] [N/A:N/A] Volume (cm) : [1:85.50%] [N/A:N/A] % Reduction in A [1:rea: 65.30%] [N/A:N/A] % Reduction in Volume: [1:3] Starting Position 1 (o'clock): [1:7] Ending Position  1 (o'clock): [1:1.6] Maximum Distance 1 (cm): [1:Yes] [N/A:N/A] Undermining: [1:Category/Stage IV] [N/A:N/A] Classification: [1:Medium] [N/A:N/A] Exudate A mount: [1:Serosanguineous] [N/A:N/A] Exudate Type: [1:red, brown] [N/A:N/A] Exudate Color: [1:Well defined, not attached] [N/A:N/A] Wound Margin: [1:Large (67-100%)] [N/A:N/A] Granulation A mount: [1:Red, Pink] [N/A:N/A] Granulation Quality: [1:Small (1-33%)] [N/A:N/A] Necrotic A mount: [1:Fat Layer (Subcutaneous Tissue): Yes N/A] Exposed Structures: [1:Bone: Yes Fascia: No Tendon: No Muscle: No Joint: No Small (1-33%)] [N/A:N/A] Epithelialization: [1:Debridement - Excisional] [N/A:N/A] Debridement: Pre-procedure Verification/Time Out 13:15 [N/A:N/A] Taken: [1:Subcutaneous, Slough] [N/A:N/A] Tissue Debrided: [1:Skin/Subcutaneous Tissue] [N/A:N/A] Level: [1:4.56] [N/A:N/A] Debridement A (sq cm): [1:rea Curette] [N/A:N/A] Instrument: [1:Minimum] [N/A:N/A] Bleeding: [1:Pressure] [N/A:N/A] Hemostasis A chieved: [1:0] [N/A:N/A] Procedural Pain: [1:0] [N/A:N/A] Post Procedural Pain: [1:Procedure was tolerated well] [N/A:N/A] Debridement  Treatment Response: [1:1.9x2.4x1.2] [N/A:N/A] Post Debridement Measurements L x W x D (cm) [1:4.298] [N/A:N/A] Post Debridement Volume: (cm) [1:Category/Stage IV] [N/A:N/A] Post Debridement Stage: [1:Debridement] [N/A:N/A] Treatment Notes Electronic Signature(s) Signed: 01/13/2022 2:01:44 PM By: [name] MD FACS Signed: 01/14/2022 7:57:49 AM By: Dellie Catholic RN Entered By: [name] on 01/13/2022 14:01:44 -------------------------------------------------------------------------------- Multi-Disciplinary Care Plan Details Patient Name: Date of Service: [name], [place] [name] 01/13/2022 12:30 PM Medical Record Number: [id number] Patient Account Number: [id number] Date of Birth/Sex: Treating RN: 01/15/1951 (71 y.o. [name] Primary Care Darby Shadwick: [name] Other Clinician: Referring Aayush Gelpi: Treating Laronda Lisby/Extender: Milagros Evener in Treatment: 5 Active Inactive Abuse / Safety / Falls / Self Care Management Nursing Diagnoses: History of Falls Impaired physical mobility Potential for falls Potential for injury related to falls Goals: Patient will remain injury free related to falls Date Initiated: 12/08/2021 Date Inactivated: 01/13/2022 Target Resolution Date: 01/05/2022 Goal Status: Met Patient/caregiver will verbalize understanding of skin care regimen Date Initiated: 12/08/2021 Target Resolution Date: 02/12/2022 Goal Status: Active Interventions: Assess fall risk on admission and as needed Assess: immobility, friction, shearing, incontinence upon admission and as needed Assess impairment of mobility on admission and as needed per policy Provide education on fall prevention Notes: Wound/Skin Impairment Nursing Diagnoses: Impaired tissue integrity Knowledge deficit related to ulceration/compromised skin integrity Goals: Patient/caregiver will verbalize understanding of skin care regimen Date Initiated: 12/08/2021 Target  Resolution Date: 02/12/2022 Goal Status: Active Ulcer/skin breakdown will have a volume reduction of 30% by week 4 Date Initiated: 12/08/2021 Date Inactivated: 01/13/2022 Target Resolution Date: 01/05/2022 Goal Status: Met Interventions: Assess patient/caregiver ability to perform ulcer/skin care regimen upon admission and as needed Assess ulceration(s) every visit Provide education on ulcer and skin care Treatment Activities: Skin care regimen initiated : 12/08/2021 Topical wound management initiated : 12/08/2021 Notes: Electronic Signature(s) Signed: 01/13/2022 6:00:02 PM By: Levan Hurst RN, BSN Entered By: Levan Hurst on 01/13/2022 15:36:55 -------------------------------------------------------------------------------- Pain Assessment Details Patient Name: Date of Service: [name], [name] 01/13/2022 12:30 PM Medical Record Number: [id number] Patient Account Number: [id number] Date of Birth/Sex: Treating RN: August 16, 1950 (71 y.o. [name] Primary Care Alin Hutchins: [name] Other Clinician: Referring Nysa Sarin: Treating Cher Egnor/Extender: Milagros Evener in Treatment: 5 Active Problems Location of Pain Severity and Description of Pain Patient Has Paino No Site Locations Pain Management and Medication Current Pain Management: Electronic Signature(s) Signed: 01/13/2022 6:00:02 PM By: Levan Hurst RN, BSN Entered By: Levan Hurst on 01/13/2022 12:54:31 -------------------------------------------------------------------------------- Patient/Caregiver Education Details Patient Name: Date of Service: [name] N 6/14/2023andnbsp12:30 PM Medical Record Number: [id number] Patient Account Number: [id number] Date of Birth/Gender: Treating RN: 1951/01/04 (71 y.o. [name] Primary Care Physician:  [name] Other Clinician: Referring Physician: Treating Physician/Extender: Milagros Evener in  Treatment: 5 Education Assessment Education Provided To: Patient Education Topics Provided Wound/Skin Impairment: Methods: Explain/Verbal Responses: State content correctly Electronic Signature(s) Signed: 01/13/2022 6:00:02 PM By: Levan Hurst RN, BSN Entered By: Levan Hurst on 01/13/2022 15:37:11 -------------------------------------------------------------------------------- Wound Assessment Details Patient Name: Date of Service: [name], [name] 01/13/2022 12:30 PM Medical Record Number: [id number] Patient Account Number: [id number] Date of Birth/Sex: Treating RN: Jan 31, 1951 (71 y.o. [name] Primary Care Dejuan Elman: [name] Other Clinician: Referring Sloane Palmer: Treating Jadalyn Oliveri/Extender: Milagros Evener in Treatment: 5 Wound Status Wound Number: 1 Primary Etiology: Pressure Ulcer Wound Location: Right Calcaneus Wound Status: Open Wounding Event: Pressure Injury Comorbid History: Cataracts, Hypertension, Cirrhosis , Hepatitis C Date Acquired: 07/02/2020 Weeks Of Treatment: 5 Clustered Wound: No Photos Wound Measurements Length: (cm) 1.9 Width: (cm) 2.4 Depth: (cm) 1.2 Area: (cm) 3.581 Volume: (cm) 4.298 % Reduction in Area: 85.5% % Reduction in Volume: 65.3% Epithelialization: Small (1-33%) Tunneling: No Undermining: Yes Starting Position (o'clock): 3 Ending Position (o'clock): 7 Maximum Distance: (cm) 1.6 Wound Description Classification: Category/Stage IV Wound Margin: Well defined, not attached Exudate Amount: Medium Exudate Type: Serosanguineous Exudate Color: red, brown Foul Odor After Cleansing: No Slough/Fibrino Yes Wound Bed Granulation Amount: Large (67-100%) Exposed Structure Granulation Quality: Red, Pink Fascia Exposed: No Necrotic Amount: Small (1-33%) Fat Layer (Subcutaneous Tissue) Exposed: Yes Necrotic Quality: Adherent Slough Tendon Exposed: No Muscle Exposed: No Joint Exposed:  No Bone Exposed: Yes Treatment Notes Wound #1 (Calcaneus) Wound Laterality: Right Cleanser Soap and Water Discharge Instruction: May shower and wash wound with dial antibacterial soap and water prior to dressing change. Wound Cleanser Discharge Instruction: Cleanse the wound with wound cleanser prior to applying a clean dressing using gauze sponges, not tissue or cotton balls. Peri-Wound Care Zinc Oxide Ointment 30g tube Discharge Instruction: Apply Zinc Oxide to periwound with each dressing change Topical Keystone antibiotic compound Discharge Instruction: Apply directly to wound bed once available Primary Dressing Secondary Dressing Secured With Kerlix Roll Sterile, 4.5x3.1 (in/yd) Discharge Instruction: Secure with Kerlix as directed. 68M Medipore Soft Cloth Surgical T 2x10 (in/yd) ape Discharge Instruction: Secure with tape as directed. Compression Wrap Compression Stockings Add-Ons Electronic Signature(s) Signed: 01/13/2022 6:00:02 PM By: Levan Hurst RN, BSN Entered By: Levan Hurst on 01/13/2022 13:04:15 -------------------------------------------------------------------------------- Vitals Details Patient Name: Date of Service: [name], [name] 01/13/2022 12:30 PM Medical Record Number: [id number] Patient Account Number: [id number] Date of Birth/Sex: Treating RN: 1950-10-06 (71 y.o. [name] Primary Care Anthea Udovich: [name] Other Clinician: Referring Carisha Kantor: Treating Nastashia Gallo/Extender: Milagros Evener in Treatment: 5 Vital Signs Time Taken: 12:53 Temperature (F): 97.9 Pulse (bpm): 53 Respiratory Rate (breaths/min): 18 Blood Pressure (mmHg): 107/68 Reference Range: 80 - 120 mg / dl Electronic Signature(s) Signed: 01/13/2022 6:00:02 PM By: Levan Hurst RN, BSN Entered By: Levan Hurst on 01/13/2022 12:54:26
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
## 2022-01-16 IMAGING — DX DG CHEST 1V PORT
1 series · 1 of 1 positions shown · non-contrast
Comparison: August 03, 2020

CLINICAL DATA: Elevated white blood cell count today.

EXAM:
PORTABLE CHEST 1 VIEW

[chest]
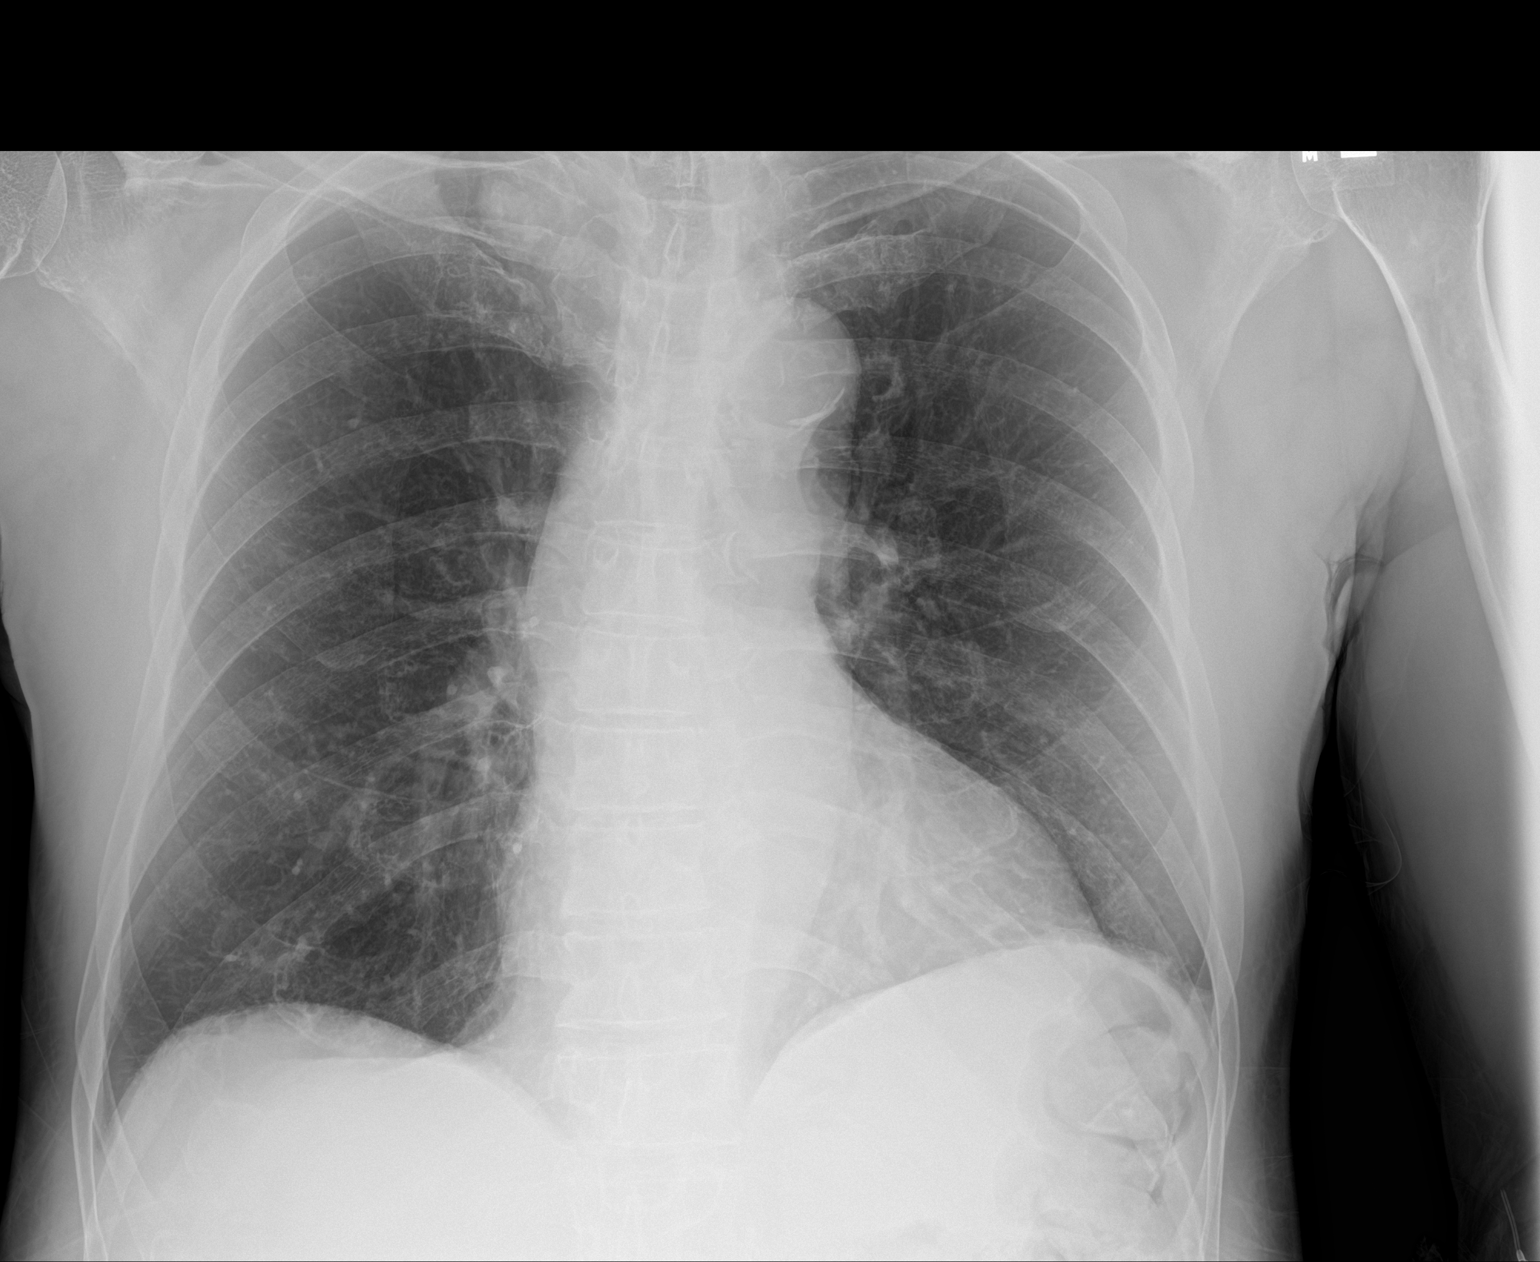

[1 of 1 positions shown; findings below may reference images not displayed]

FINDINGS: The heart size and mediastinal contours are within normal limits.
Aortic atherosclerosis. No consolidation. Several small calcified
granulomas in the right lung, similar to prior. No visible pleural
effusions or pneumothorax. No acute osseous abnormality. Chronic
elevated left hemidiaphragm.
IMPRESSION: No active disease.

## 2022-01-25 ENCOUNTER — Encounter (HOSPITAL_BASED_OUTPATIENT_CLINIC_OR_DEPARTMENT_OTHER): Payer: 59 | Admitting: General Surgery

## 2022-01-25 DIAGNOSIS — L89610 Pressure ulcer of right heel, unstageable: Secondary | ICD-10-CM | POA: Diagnosis not present
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
## 2022-01-28 NOTE — Progress Notes (Signed)
Micheal, [name] ([id number]) Visit Report for 01/25/2022 Arrival Information Details Patient Name: Date of Service: [name] 01/25/2022 12:30 PM Medical Record Number: [id number] Patient Account Number: [id number] Date of Birth/Sex: Treating RN: [birth year] (71 y.o. [name] Primary Care [name]: [name] Other Clinician: Referring [name]: Treating [name]/Extender: [name] in Treatment: 6 Visit Information History Since Last Visit Added or deleted any medications: No Patient Arrived: Wheel Chair Any new allergies or adverse reactions: No Arrival Time: 12:42 Had a fall or experienced change in No Accompanied By: sister activities of daily living that may affect Transfer Assistance: Manual risk of falls: Patient Identification Verified: Yes Signs or symptoms of abuse/neglect since last visito No Patient Requires Transmission-Based Precautions: No Hospitalized since last visit: No Patient Has Alerts: Yes Implantable device outside of the clinic excluding No Patient Alerts: Patient on Blood Thinner cellular tissue based products placed in the center since last visit: Has Dressing in Place as Prescribed: Yes Pain Present Now: No Electronic Signature(s) Signed: 01/27/2022 5:23:31 PM By: Micheal Catholic RN Entered By: [name] on 01/25/2022 13:00:03 -------------------------------------------------------------------------------- Encounter Discharge Information Details Patient Name: Date of Service: [name], [name] 01/25/2022 12:30 PM Medical Record Number: [id number] Patient Account Number: [id number] Date of Birth/Sex: Treating RN: 07/17/1951 (71 y.o. [name] Primary Care [name]: [name] Other Clinician: Referring [name]: Treating [name]/Extender: [name] in Treatment: 6 Encounter Discharge Information Items Post Procedure Vitals Discharge  Condition: Stable Temperature (F): 97.7 Ambulatory Status: Wheelchair Pulse (bpm): 56 Discharge Destination: Home Respiratory Rate (breaths/min): 16 Transportation: Private Auto Blood Pressure (mmHg): 146/77 Accompanied By: sister Schedule Follow-up Appointment: Yes Clinical Summary of Care: Patient Declined Electronic Signature(s) Signed: 01/27/2022 5:23:31 PM By: Micheal Catholic RN Entered By: [name] on 01/27/2022 17:23:09 -------------------------------------------------------------------------------- Lower Extremity Assessment Details Patient Name: Date of Service: [name], [name] 01/25/2022 12:30 PM Medical Record Number: [id number] Patient Account Number: [id number] Date of Birth/Sex: Treating RN: 1950-12-18 (71 y.o. [name] Primary Care Rylynn Kobs: [name] Other Clinician: Referring [name]: Treating [name]/Extender: [name] in Treatment: 6 [name]: No] [Right: No] Edema: [Left: N] [Right: o] Calf Left: Right: Point of Measurement: From Medial Instep 34 cm Ankle Left: Right: Point of Measurement: From Medial Instep 22.8 cm Electronic Signature(s) Signed: 01/27/2022 5:23:31 PM By: Micheal Catholic RN Entered By: [name] on 01/25/2022 13:02:33 -------------------------------------------------------------------------------- Multi Wound Chart Details Patient Name: Date of Service: [name], [name] 01/25/2022 12:30 PM Medical Record Number: [id number] Patient Account Number: [id number] Date of Birth/Sex: Treating RN: 08-16-1950 (71 y.o. [name] Primary Care Chetara Kropp: [name] Other Clinician: Referring [name]: Treating [name]: [name] in Treatment: 6 Vital Signs Height(in): Pulse(bpm): 56 Weight(lbs): Blood Pressure(mmHg): 146/77 Body Mass Index(BMI): Temperature(F): 97.7 Respiratory  Rate(breaths/min): 16 Photos: [N/A:N/A] Right Calcaneus N/A N/A Wound Location: Pressure Injury N/A N/A Wounding Event: Pressure Ulcer N/A N/A Primary Etiology: Cataracts, Hypertension, Cirrhosis , N/A N/A Comorbid History: Hepatitis C 07/02/2020 N/A N/A Date Acquired: 6 N/A N/A Weeks of Treatment: Open N/A N/A Wound Status: No N/A N/A Wound Recurrence: 1.9x2.2x0.8 N/A N/A Measurements L x W x D (cm) 3.283 N/A N/A A (cm) : rea 2.626 N/A N/A Volume (cm) : 86.70% N/A N/A % Reduction in A rea: 78.80% N/A N/A % Reduction in Volume: 3 Starting Position 1 (o'clock): 7 Ending Position 1 (o'clock):  1.3 Maximum Distance 1 (cm): Yes N/A N/A Undermining: Category/Stage IV N/A N/A Classification: Medium N/A N/A Exudate A mount: Serosanguineous N/A N/A Exudate Type: red, brown N/A N/A Exudate Color: Well defined, not attached N/A N/A Wound Margin: Large (67-100%) N/A N/A Granulation A mount: Red, Pink N/A N/A Granulation Quality: Small (1-33%) N/A N/A Necrotic A mount: Eschar, Adherent Slough N/A N/A Necrotic Tissue: Fat Layer (Subcutaneous Tissue): Yes N/A N/A Exposed Structures: Fascia: No Tendon: No Muscle: No Joint: No Bone: No Small (1-33%) N/A N/A Epithelialization: Debridement - Excisional N/A N/A Debridement: Pre-procedure Verification/Time Out 13:15 N/A N/A Taken: Lidocaine 5% topical ointment N/A N/A Pain Control: Other, Subcutaneous, Slough N/A N/A Tissue Debrided: Skin/Subcutaneous Tissue N/A N/A Level: 4.18 N/A N/A Debridement A (sq cm): rea Curette N/A N/A Instrument: Minimum N/A N/A Bleeding: Pressure N/A N/A Hemostasis A chieved: 0 N/A N/A Procedural Pain: 0 N/A N/A Post Procedural Pain: Procedure was tolerated well N/A N/A Debridement Treatment Response: 1.9x2.2x0.8 N/A N/A Post Debridement Measurements L x W x D (cm) 2.626 N/A N/A Post Debridement Volume: (cm) Category/Stage IV N/A N/A Post Debridement  Stage: Debridement N/A N/A Procedures Performed: Treatment Notes Electronic Signature(s) Signed: 01/25/2022 1:21:45 PM By: [name] MD FACS Signed: 01/27/2022 5:23:31 PM By: Micheal Catholic RN Entered By: [name] on 01/25/2022 13:21:45 -------------------------------------------------------------------------------- Multi-Disciplinary Care Plan Details Patient Name: Date of Service: [name], [name] 01/25/2022 12:30 PM Medical Record Number: [id number] Patient Account Number: [id number] Date of Birth/Sex: Treating RN: 02/17/1951 (71 y.o. [name] Primary Care Requan Hardge: [name] Other Clinician: Referring Bernadene Garside: Treating Dajaun Goldring/Extender: [name] in Treatment: 6 Active Inactive Abuse / Safety / Falls / Self Care Management Nursing Diagnoses: History of Falls Impaired physical mobility Potential for falls Potential for injury related to falls Goals: Patient will remain injury free related to falls Date Initiated: 12/08/2021 Date Inactivated: 01/13/2022 Target Resolution Date: 01/05/2022 Goal Status: Met Patient/caregiver will verbalize understanding of skin care regimen Date Initiated: 12/08/2021 Target Resolution Date: 02/12/2022 Goal Status: Active Interventions: Assess fall risk on admission and as needed Assess: immobility, friction, shearing, incontinence upon admission and as needed Assess impairment of mobility on admission and as needed per policy Provide education on fall prevention Notes: Wound/Skin Impairment Nursing Diagnoses: Impaired tissue integrity Knowledge deficit related to ulceration/compromised skin integrity Goals: Patient/caregiver will verbalize understanding of skin care regimen Date Initiated: 12/08/2021 Target Resolution Date: 02/12/2022 Goal Status: Active Ulcer/skin breakdown will have a volume reduction of 30% by week 4 Date Initiated: 12/08/2021 Date Inactivated:  01/13/2022 Target Resolution Date: 01/05/2022 Goal Status: Met Interventions: Assess patient/caregiver ability to perform ulcer/skin care regimen upon admission and as needed Assess ulceration(s) every visit Provide education on ulcer and skin care Treatment Activities: Skin care regimen initiated : 12/08/2021 Topical wound management initiated : 12/08/2021 Notes: Electronic Signature(s) Signed: 01/27/2022 5:23:31 PM By: Micheal Catholic RN Entered By: [name] on 01/25/2022 13:09:19 -------------------------------------------------------------------------------- Negative Pressure Wound Therapy Application (NPWT) Details Patient Name: Date of Service: [name] RO N 01/25/2022 12:30 PM Medical Record Number: [id number] Patient Account Number: [id number] Date of Birth/Sex: Treating RN: 10/21/1950 (71 y.o. [name] Primary Care Violanda Bobeck: [name] Other Clinician: Referring Dimitri Dsouza: Treating [place] Ice/Extender: [name] in Treatment: 6 NPWT Application Performed for: Wound #1 Right Calcaneus Performed By: Micheal Catholic, RN Type: VAC System Coverage Size (sq cm): 4.18 Pressure Type: Constant Pressure Setting: 125 mmHG Drain Type: None Primary Contact: Non-Adherent Quantity of  Sponges/Gauze Inserted: 2 Sponge/Dressing Type: Foam, Black Date Initiated: 01/15/2022 Response to Treatment: well Post Procedure Diagnosis Same as Pre-procedure Notes NPWT started by home health 10 days ago Electronic Signature(s) Signed: 01/27/2022 5:23:31 PM By: Micheal Catholic RN Entered By: [name] on 01/25/2022 17:07:18 -------------------------------------------------------------------------------- Pain Assessment Details Patient Name: Date of Service: [name], [place] [name] 01/25/2022 12:30 PM Medical Record Number: [id number] Patient Account Number: [id number] Date of Birth/Sex: Treating RN: March 07, 1951 (71 y.o. [name] Primary Care Jaymar Loeber: [name] Other Clinician: Referring Dennie Vecchio: Treating Amilya Haver/Extender: [name] in Treatment: 6 Active Problems Location of Pain Severity and Description of Pain Patient Has Paino No Site Locations Pain Management and Medication Current Pain Management: Electronic Signature(s) Signed: 01/27/2022 5:23:31 PM By: Micheal Catholic RN Entered By: [name] on 01/25/2022 13:00:12 -------------------------------------------------------------------------------- Patient/Caregiver Education Details Patient Name: Date of Service: [name] RO N 6/26/2023andnbsp12:30 PM Medical Record Number: [id number] Patient Account Number: [id number] Date of Birth/Gender: Treating RN: October 18, 1950 (71 y.o. [name] Primary Care Physician: [name] Other Clinician: Referring Physician: Treating Physician/Extender: [name] in Treatment: 6 Education Assessment Education Provided To: Patient Education Topics Provided Wound/Skin Impairment: Methods: Explain/Verbal Responses: Return demonstration correctly Electronic Signature(s) Signed: 01/27/2022 5:23:31 PM By: Micheal Catholic RN Entered By: [name] on 01/25/2022 13:09:34 -------------------------------------------------------------------------------- Wound Assessment Details Patient Name: Date of Service: [name], [place] [name] 01/25/2022 12:30 PM Medical Record Number: [id number] Patient Account Number: [id number] Date of Birth/Sex: Treating RN: Mar 26, 1951 (71 y.o. [name] Primary Care Dezmon Conover: [name] Other Clinician: Referring Lowen Mansouri: Treating Elliyah Liszewski/Extender: [name] in Treatment: 6 Wound Status Wound Number: 1 Primary Etiology: Pressure Ulcer Wound Location: Right Calcaneus Wound Status: Open Wounding Event: Pressure Injury Comorbid History:  Cataracts, Hypertension, Cirrhosis , Hepatitis C Date Acquired: 07/02/2020 Weeks Of Treatment: 6 Clustered Wound: No Photos Wound Measurements Length: (cm) 1.9 Width: (cm) 2.2 Depth: (cm) 0.8 Area: (cm) 3.283 Volume: (cm) 2.626 % Reduction in Area: 86.7% % Reduction in Volume: 78.8% Epithelialization: Small (1-33%) Undermining: Yes Starting Position (o'clock): 3 Ending Position (o'clock): 7 Maximum Distance: (cm) 1.3 Wound Description Classification: Category/Stage IV Wound Margin: Well defined, not attached Exudate Amount: Medium Exudate Type: Serosanguineous Exudate Color: red, brown Foul Odor After Cleansing: No Slough/Fibrino Yes Wound Bed Granulation Amount: Large (67-100%) Exposed Structure Granulation Quality: Red, Pink Fascia Exposed: No Necrotic Amount: Small (1-33%) Fat Layer (Subcutaneous Tissue) Exposed: Yes Necrotic Quality: Eschar, Adherent Slough Tendon Exposed: No Muscle Exposed: No Joint Exposed: No Bone Exposed: No Treatment Notes Wound #1 (Calcaneus) Wound Laterality: Right Cleanser Soap and Water Discharge Instruction: May shower and wash wound with dial antibacterial soap and water prior to dressing change. Wound Cleanser Discharge Instruction: Cleanse the wound with wound cleanser prior to applying a clean dressing using gauze sponges, not tissue or cotton balls. Peri-Wound Care Topical Keystone antibiotic compound Discharge Instruction: Apply directly to wound bed once available Primary Dressing Secondary Dressing Secured With Kerlix Roll Sterile, 4.5x3.1 (in/yd) Discharge Instruction: Secure with Kerlix as directed. 38M Medipore Soft Cloth Surgical T 2x10 (in/yd) ape Discharge Instruction: Secure with tape as directed. Compression Wrap Compression Stockings Add-Ons Electronic Signature(s) Signed: 01/27/2022 5:23:31 PM By: Micheal Catholic RN Entered By: [name] on 01/25/2022  13:08:31 -------------------------------------------------------------------------------- Vitals Details Patient Name: Date of Service: [name], [name] 01/25/2022 12:30 PM Medical Record Number: [id number] Patient Account Number: [id number] Date of Birth/Sex: Treating  RN: 1951-06-22 (71 y.o. [name] Primary Care Lekha Dancer: [name] Other Clinician: Referring Katina Remick: Treating Avangeline Stockburger/Extender: [name] in Treatment: 6 Vital Signs Time Taken: 12:43 Temperature (F): 97.7 Pulse (bpm): 56 Respiratory Rate (breaths/min): 16 Blood Pressure (mmHg): 146/77 Reference Range: 80 - 120 mg / dl Electronic Signature(s) Signed: 01/27/2022 5:23:31 PM By: Micheal Catholic RN Entered By: [name] on 01/25/2022 12:59:51

## 2022-02-08 ENCOUNTER — Encounter (HOSPITAL_BASED_OUTPATIENT_CLINIC_OR_DEPARTMENT_OTHER): Payer: 59 | Attending: General Surgery | Admitting: General Surgery

## 2022-02-08 DIAGNOSIS — K7460 Unspecified cirrhosis of liver: Secondary | ICD-10-CM | POA: Insufficient documentation

## 2022-02-08 DIAGNOSIS — L89610 Pressure ulcer of right heel, unstageable: Secondary | ICD-10-CM | POA: Diagnosis present

## 2022-02-08 DIAGNOSIS — I10 Essential (primary) hypertension: Secondary | ICD-10-CM | POA: Insufficient documentation

## 2022-02-08 DIAGNOSIS — B182 Chronic viral hepatitis C: Secondary | ICD-10-CM | POA: Diagnosis not present

## 2022-02-08 DIAGNOSIS — Z8673 Personal history of transient ischemic attack (TIA), and cerebral infarction without residual deficits: Secondary | ICD-10-CM | POA: Insufficient documentation

## 2022-02-08 NOTE — Progress Notes (Signed)
DAYDEN, VIVERETTE ([id number]) Visit Report for 02/08/2022 Arrival Information Details Patient Name: Date of Service: [name] 02/08/2022 12:30 PM Medical Record Number: [id number] Patient Account Number: [id number] Date of Birth/Sex: Treating RN: 1950-08-08 (71 y.o. [name] Primary Care Jaysion Ramseyer: [name] Other Clinician: Referring Faria Casella: Treating Niti Leisure/Extender: Milagros Evener in Treatment: 8 Visit Information History Since Last Visit Added or deleted any medications: No Patient Arrived: Wheel Chair Any new allergies or adverse reactions: No Arrival Time: 12:49 Had a fall or experienced change in No Accompanied By: sister activities of daily living that may affect Transfer Assistance: Manual risk of falls: Patient Identification Verified: Yes Signs or symptoms of abuse/neglect since last visito No Patient Requires Transmission-Based Precautions: No Hospitalized since last visit: No Patient Has Alerts: Yes Implantable device outside of the clinic excluding No Patient Alerts: Patient on Blood Thinner cellular tissue based products placed in the center since last visit: Has Dressing in Place as Prescribed: Yes Pain Present Now: No Electronic Signature(s) Signed: 02/08/2022 5:41:51 PM By: Dellie Catholic RN Entered By: Dellie Catholic on 02/08/2022 12:50:19 -------------------------------------------------------------------------------- Encounter Discharge Information Details Patient Name: Date of Service: [name], [name] 02/08/2022 12:30 PM Medical Record Number: [id number] Patient Account Number: [id number] Date of Birth/Sex: Treating RN: 1951-07-01 (71 y.o. [name] Primary Care Chantae Soo: [name] Other Clinician: Referring Pharaoh Pio: Treating Latia Mataya/Extender: Milagros Evener in Treatment: 8 Encounter Discharge Information Items Post Procedure Vitals Discharge  Condition: Stable Temperature (F): 98.3 Ambulatory Status: Wheelchair Pulse (bpm): 75 Discharge Destination: Home Respiratory Rate (breaths/min): 16 Transportation: Other Blood Pressure (mmHg): 131/76 Accompanied By: sister Schedule Follow-up Appointment: Yes Clinical Summary of Care: Patient Declined Electronic Signature(s) Signed: 02/08/2022 5:41:51 PM By: Dellie Catholic RN Entered By: Dellie Catholic on 02/08/2022 17:00:41 -------------------------------------------------------------------------------- Lower Extremity Assessment Details Patient Name: Date of Service: [name], [name] 02/08/2022 12:30 PM Medical Record Number: [id number] Patient Account Number: [id number] Date of Birth/Sex: Treating RN: 1951-04-29 (71 y.o. [name] Primary Care Tanicia Wolaver: [name] Other Clinician: Referring Kody Vigil: Treating Sava Proby/Extender: Milagros Evener in Treatment: 8 Edema Assessment Assessed: [Left: No] [Right: No] Edema: [Left: N] [Right: o] Calf Left: Right: Point of Measurement: From Medial Instep 34 cm Ankle Left: Right: Point of Measurement: From Medial Instep 22.8 cm Electronic Signature(s) Signed: 02/08/2022 5:41:51 PM By: Dellie Catholic RN Entered By: Dellie Catholic on 02/08/2022 12:53:11 -------------------------------------------------------------------------------- Multi Wound Chart Details Patient Name: Date of Service: [name], [name] 02/08/2022 12:30 PM Medical Record Number: [id number] Patient Account Number: [id number] Date of Birth/Sex: Treating RN: [birth year] (71 y.o. [name] Primary Care Shahir Karen: [name] Other Clinician: Referring Keanu Lesniak: Treating Chaniqua Brisby/Extender: Milagros Evener in Treatment: 8 Vital Signs Height(in): Pulse(bpm): 75 Weight(lbs): Blood Pressure(mmHg): 131/76 Body Mass Index(BMI): Temperature(F): 98.3 Respiratory Rate(breaths/min):  16 Photos: [N/A:N/A] Right Calcaneus N/A N/A Wound Location: Pressure Injury N/A N/A Wounding Event: Pressure Ulcer N/A N/A Primary Etiology: Cataracts, Hypertension, Cirrhosis , N/A N/A Comorbid History: Hepatitis C 07/02/2020 N/A N/A Date Acquired: 8 N/A N/A Weeks of Treatment: Open N/A N/A Wound Status: No N/A N/A Wound Recurrence: 1.9x2.2x0.6 N/A N/A Measurements L x W x D (cm) 3.283 N/A N/A A (cm) : rea 1.97 N/A N/A Volume (cm) : 86.70% N/A N/A % Reduction in A rea: 84.10% N/A N/A % Reduction in Volume: 3 Starting Position 1 (o'clock): 7 Ending Position 1 (o'clock): 0.9  Maximum Distance 1 (cm): Yes N/A N/A Undermining: Category/Stage IV N/A N/A Classification: Medium N/A N/A Exudate A mount: Serosanguineous N/A N/A Exudate Type: red, brown N/A N/A Exudate Color: Well defined, not attached N/A N/A Wound Margin: Large (67-100%) N/A N/A Granulation A mount: Red, Pink N/A N/A Granulation Quality: Small (1-33%) N/A N/A Necrotic A mount: Eschar, Adherent Slough N/A N/A Necrotic Tissue: Fat Layer (Subcutaneous Tissue): Yes N/A N/A Exposed Structures: Fascia: No Tendon: No Muscle: No Joint: No Bone: No Small (1-33%) N/A N/A Epithelialization: Debridement - Excisional N/A N/A Debridement: Pre-procedure Verification/Time Out 13:00 N/A N/A Taken: Lidocaine 5% topical ointment N/A N/A Pain Control: Subcutaneous, Slough N/A N/A Tissue Debrided: Skin/Subcutaneous Tissue N/A N/A Level: 4.18 N/A N/A Debridement A (sq cm): rea Curette N/A N/A Instrument: Minimum N/A N/A Bleeding: Pressure N/A N/A Hemostasis A chieved: 0 N/A N/A Procedural Pain: 0 N/A N/A Post Procedural Pain: Procedure was tolerated well N/A N/A Debridement Treatment Response: 1.9x2.2x0.6 N/A N/A Post Debridement Measurements L x W x D (cm) 1.97 N/A N/A Post Debridement Volume: (cm) Category/Stage IV N/A N/A Post Debridement Stage: Debridement N/A N/A Procedures  Performed: Treatment Notes Electronic Signature(s) Signed: 02/08/2022 1:32:11 PM By: [name] MD FACS Signed: 02/08/2022 5:41:51 PM By: Dellie Catholic RN Entered By: [name] on 02/08/2022 13:32:11 -------------------------------------------------------------------------------- Multi-Disciplinary Care Plan Details Patient Name: Date of Service: [name], [place] [name] 02/08/2022 12:30 PM Medical Record Number: [id number] Patient Account Number: [id number] Date of Birth/Sex: Treating RN: 1950/11/04 (71 y.o. [name] Primary Care Saryiah Bencosme: [name] Other Clinician: Referring Tarius Stangelo: Treating Sanora Cunanan/Extender: Milagros Evener in Treatment: 8 Active Inactive Abuse / Safety / Falls / Self Care Management Nursing Diagnoses: History of Falls Impaired physical mobility Potential for falls Potential for injury related to falls Goals: Patient will remain injury free related to falls Date Initiated: 12/08/2021 Date Inactivated: 01/13/2022 Target Resolution Date: 01/05/2022 Goal Status: Met Patient/caregiver will verbalize understanding of skin care regimen Date Initiated: 12/08/2021 Target Resolution Date: 04/30/2022 Goal Status: Active Interventions: Assess fall risk on admission and as needed Assess: immobility, friction, shearing, incontinence upon admission and as needed Assess impairment of mobility on admission and as needed per policy Provide education on fall prevention Notes: Wound/Skin Impairment Nursing Diagnoses: Impaired tissue integrity Knowledge deficit related to ulceration/compromised skin integrity Goals: Patient/caregiver will verbalize understanding of skin care regimen Date Initiated: 12/08/2021 Target Resolution Date: 04/30/2022 Goal Status: Active Ulcer/skin breakdown will have a volume reduction of 30% by week 4 Date Initiated: 12/08/2021 Date Inactivated: 01/13/2022 Target Resolution Date: 01/05/2022 Goal  Status: Met Interventions: Assess patient/caregiver ability to perform ulcer/skin care regimen upon admission and as needed Assess ulceration(s) every visit Provide education on ulcer and skin care Treatment Activities: Skin care regimen initiated : 12/08/2021 Topical wound management initiated : 12/08/2021 Notes: Electronic Signature(s) Signed: 02/08/2022 5:41:51 PM By: Dellie Catholic RN Entered By: Dellie Catholic on 02/08/2022 16:57:29 -------------------------------------------------------------------------------- Negative Pressure Wound Therapy Maintenance (NPWT) Details Patient Name: Date of Service: [name] N 02/08/2022 12:30 PM Medical Record Number: [id number] Patient Account Number: [id number] Date of Birth/Sex: Treating RN: [birth year] (71 y.o. [name] Primary Care Iliana Hutt: [name] Other Clinician: Referring Creedence Heiss: Treating Monteen Toops/Extender: Milagros Evener in Treatment: 8 NPWT Maintenance Performed for: Wound #1 Right Calcaneus Performed By: Dellie Catholic, RN Type: VAC System Coverage Size (sq cm): 4.18 Pressure Type: Constant Pressure Setting: 125 mmHG Drain Type: None Primary Contact: Non-Adherent Sponge/Dressing Type: Foam, Black  Date Initiated: 01/15/2022 Dressing Removed: Yes Quantity of Sponges/Gauze Removed: 2 Canister Changed: Yes Canister Exudate Volume: 40 Dressing Reapplied: Yes Quantity of Sponges/Gauze Inserted: 2 Respones T Treatment: o pt. tolerated well Days On NPWT : 25 Post Procedure Diagnosis Same as Pre-procedure Electronic Signature(s) Signed: 02/08/2022 5:41:51 PM By: Dellie Catholic RN Entered By: Dellie Catholic on 02/08/2022 16:59:31 -------------------------------------------------------------------------------- Pain Assessment Details Patient Name: Date of Service: [name], [place] [name] 02/08/2022 12:30 PM Medical Record Number: [id number] Patient Account Number:  [id number] Date of Birth/Sex: Treating RN: Oct 28, 1950 (71 y.o. [name] Primary Care Jaretssi Kraker: [name] Other Clinician: Referring Azeem Poorman: Treating Amias Hutchinson/Extender: Milagros Evener in Treatment: 8 Active Problems Location of Pain Severity and Description of Pain Patient Has Paino No Site Locations Pain Management and Medication Current Pain Management: Electronic Signature(s) Signed: 02/08/2022 5:41:51 PM By: Dellie Catholic RN Entered By: Dellie Catholic on 02/08/2022 12:53:07 -------------------------------------------------------------------------------- Patient/Caregiver Education Details Patient Name: Date of Service: [name] N 7/10/2023andnbsp12:30 PM Medical Record Number: [id number] Patient Account Number: [id number] Date of Birth/Gender: Treating RN: [birth year] (71 y.o. [name] Primary Care Physician: [name] Other Clinician: Referring Physician: Treating Physician/Extender: Milagros Evener in Treatment: 8 Education Assessment Education Provided To: Patient Education Topics Provided Wound/Skin Impairment: Methods: Explain/Verbal Responses: Return demonstration correctly Electronic Signature(s) Signed: 02/08/2022 5:41:51 PM By: Dellie Catholic RN Entered By: Dellie Catholic on 02/08/2022 16:57:50 -------------------------------------------------------------------------------- Wound Assessment Details Patient Name: Date of Service: [name], [place] [name] 02/08/2022 12:30 PM Medical Record Number: [id number] Patient Account Number: [id number] Date of Birth/Sex: Treating RN: 05-Mar-1951 (71 y.o. [name] Primary Care Isidore Margraf: [name] Other Clinician: Referring Razia Screws: Treating Elward Nocera/Extender: Milagros Evener in Treatment: 8 Wound Status Wound Number: 1 Primary Etiology: Pressure Ulcer Wound Location: Right  Calcaneus Wound Status: Open Wounding Event: Pressure Injury Comorbid History: Cataracts, Hypertension, Cirrhosis , Hepatitis C Date Acquired: 07/02/2020 Weeks Of Treatment: 8 Clustered Wound: No Photos Wound Measurements Length: (cm) 1.9 Width: (cm) 2.2 Depth: (cm) 0.6 Area: (cm) 3.283 Volume: (cm) 1.97 % Reduction in Area: 86.7% % Reduction in Volume: 84.1% Epithelialization: Small (1-33%) Undermining: Yes Starting Position (o'clock): 3 Ending Position (o'clock): 7 Maximum Distance: (cm) 0.9 Wound Description Classification: Category/Stage IV Wound Margin: Well defined, not attached Exudate Amount: Medium Exudate Type: Serosanguineous Exudate Color: red, brown Foul Odor After Cleansing: No Slough/Fibrino Yes Wound Bed Granulation Amount: Large (67-100%) Exposed Structure Granulation Quality: Red, Pink Fascia Exposed: No Necrotic Amount: Small (1-33%) Fat Layer (Subcutaneous Tissue) Exposed: Yes Necrotic Quality: Eschar, Adherent Slough Tendon Exposed: No Muscle Exposed: No Joint Exposed: No Bone Exposed: No Treatment Notes Wound #1 (Calcaneus) Wound Laterality: Right Cleanser Soap and Water Discharge Instruction: May shower and wash wound with dial antibacterial soap and water prior to dressing change. Wound Cleanser Discharge Instruction: Cleanse the wound with wound cleanser prior to applying a clean dressing using gauze sponges, not tissue or cotton balls. Peri-Wound Care Topical Keystone antibiotic compound Discharge Instruction: Apply directly to wound bed once available Primary Dressing Secondary Dressing ALLEVYN Heel 4 1/2in x 5 1/2in / 10.5cm x 13.5cm Discharge Instruction: Apply over primary dressing as directed. Secured With Elastic Bandage 4 inch (ACE bandage) Discharge Instruction: Secure with ACE bandage as directed. Kerlix Roll Sterile, 4.5x3.1 (in/yd) Discharge Instruction: Secure with Kerlix as directed. 79M Medipore Soft Cloth Surgical T  2x10 (in/yd) ape Discharge Instruction: Secure with tape as directed. Compression Wrap Compression Stockings  Add-Ons Electronic Signature(s) Signed: 02/08/2022 5:41:51 PM By: Dellie Catholic RN Entered By: Dellie Catholic on 02/08/2022 13:00:41 -------------------------------------------------------------------------------- Vitals Details Patient Name: Date of Service: [name], [name] 02/08/2022 12:30 PM Medical Record Number: [id number] Patient Account Number: [id number] Date of Birth/Sex: Treating RN: [birth year] (71 y.o. [name] Primary Care Denishia Citro: [name] Other Clinician: Referring Mindy Gali: Treating Terin Cragle/Extender: Milagros Evener in Treatment: 8 Vital Signs Time Taken: 12:49 Temperature (F): 98.3 Pulse (bpm): 75 Respiratory Rate (breaths/min): 16 Blood Pressure (mmHg): 131/76 Reference Range: 80 - 120 mg / dl Electronic Signature(s) Signed: 02/08/2022 5:41:51 PM By: Dellie Catholic RN Entered By: Dellie Catholic on 02/08/2022 12:53:00

## 2022-02-09 NOTE — Progress Notes (Signed)
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 I63.9 Cerebral infarction, unspecified I10 Essential (primary) hypertension Facility Procedures CPT4 Code: [id number] Description: 94585 - DEB SUBQ TISSUE 20 SQ CM/< ICD-10 Diagnosis Description L89.610 Pressure ulcer of right heel, unstageable Modifier: Quantity: 1 Physician Procedures : CPT4 Code Description Modifier [id number] - WC PHYS LEVEL 3 - EST PT 25 ICD-10 Diagnosis Description L89.610 Pressure ulcer of right heel,  unstageable Z86.73 Personal history of transient ischemic attack (TIA), and cerebral infarction without  residual deficits K74.60 Unspecified cirrhosis of liver I10 Essential (primary) hypertension Quantity: 1 : [id number] - WC PHYS SUBQ TISS 20 SQ CM ICD-10 Diagnosis Description L89.610 Pressure ulcer of right heel, unstageable Quantity: 1 Electronic Signature(s) Signed: 02/08/2022 1:37:33 PM By: [name] MD FACS Entered By: [name] on 02/08/2022 13:37:33

## 2022-02-22 ENCOUNTER — Encounter (HOSPITAL_BASED_OUTPATIENT_CLINIC_OR_DEPARTMENT_OTHER): Payer: 59 | Admitting: General Surgery

## 2022-03-02 ENCOUNTER — Encounter (HOSPITAL_BASED_OUTPATIENT_CLINIC_OR_DEPARTMENT_OTHER): Payer: 59 | Attending: General Surgery | Admitting: General Surgery

## 2022-03-02 DIAGNOSIS — I10 Essential (primary) hypertension: Secondary | ICD-10-CM | POA: Diagnosis not present

## 2022-03-02 DIAGNOSIS — B182 Chronic viral hepatitis C: Secondary | ICD-10-CM | POA: Diagnosis not present

## 2022-03-02 DIAGNOSIS — K7460 Unspecified cirrhosis of liver: Secondary | ICD-10-CM | POA: Diagnosis not present

## 2022-03-02 DIAGNOSIS — L89610 Pressure ulcer of right heel, unstageable: Secondary | ICD-10-CM | POA: Insufficient documentation

## 2022-03-02 DIAGNOSIS — Z8673 Personal history of transient ischemic attack (TIA), and cerebral infarction without residual deficits: Secondary | ICD-10-CM | POA: Insufficient documentation

## 2022-03-02 DIAGNOSIS — Z8616 Personal history of COVID-19: Secondary | ICD-10-CM | POA: Insufficient documentation

## 2022-03-02 NOTE — Progress Notes (Signed)
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 Former smoker - ended on 09/25/1982; Marital Status - Single; Alcohol Use: Never; Drug Use: Prior History - cocaine, heroin, marijuana; Caffeine Use: Never; Financial Concerns: No; Food, Clothing or Shelter Needs: No; Support System Lacking: Yes; Transportation Concerns: No Electronic Signature(s) Signed: 03/02/2022 1:14:05 PM By: [name] MD FACS Entered By: [name] on 03/02/2022 13:10:38 -------------------------------------------------------------------------------- SuperBill Details Patient Name: Date of Service: [name] 03/02/2022 Medical Record Number: [id number] Patient Account Number:  [id number] Date of Birth/Sex: Treating RN: 1951/01/23 (71 y.o. 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), and cerebral infarction without residual deficits K74.60 Unspecified cirrhosis of liver B18.2 Chronic viral hepatitis C I63.9 Cerebral infarction, unspecified I10 Essential (primary) hypertension Facility Procedures CPT4 Code: [id number] Description: [phone] - DEBRIDE WOUND 1ST 20 SQ CM OR < ICD-10 Diagnosis Description L89.610 Pressure ulcer of right heel, unstageable Modifier: Quantity: 1 Physician Procedures : CPT4 Code Description Modifier [id number] 10071 - WC PHYS LEVEL 4 - EST PT 25 ICD-10 Diagnosis Description L89.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

## 2022-03-02 NOTE — Progress Notes (Signed)
[name], [name] ([id number]) Visit Report for 03/02/2022 Arrival Information Details Patient Name: Date of Service: [name] 03/02/2022 12:30 PM Medical Record Number: [id number] Patient Account Number: [id number] Date of Birth/Sex: Treating RN: [birth year] (71 y.o. [name] Primary Care Romuald Mccaslin: [name] Other Clinician: Referring Jaionna Weisse: Treating Maeryn Mcgath/Extender: Milagros Evener in Treatment: 12 Visit Information History Since Last Visit Added or deleted any medications: No Patient Arrived: Wheel Chair Any new allergies or adverse reactions: No Arrival Time: 12:37 Had a fall or experienced change in No Accompanied By: sister activities of daily living that may affect Transfer Assistance: None risk of falls: Patient Identification Verified: Yes Signs or symptoms of abuse/neglect since last visito No Patient Requires Transmission-Based Precautions: No Hospitalized since last visit: No Patient Has Alerts: Yes Implantable device outside of the clinic excluding No Patient Alerts: Patient on Blood Thinner cellular tissue based products placed in the center since last visit: Has Dressing in Place as Prescribed: Yes Pain Present Now: No Electronic Signature(s) Signed: 03/02/2022 6:08:24 PM By: Dellie Catholic RN Entered By: Dellie Catholic on 03/02/2022 12:47:17 -------------------------------------------------------------------------------- Encounter Discharge Information Details Patient Name: Date of Service: [name], [name] 03/02/2022 12:30 PM Medical Record Number: [id number] Patient Account Number: [id number] Date of Birth/Sex: Treating RN: 10/19/1950 (71 y.o. [name] Primary Care Nivia Gervase: [name] Other Clinician: Referring [name]: Treating Elea Holtzclaw/Extender: Milagros Evener in Treatment: 12 Encounter Discharge Information Items Post Procedure Vitals Discharge Condition:  Stable Temperature (F): 97.5 Ambulatory Status: Wheelchair Pulse (bpm): 53 Discharge Destination: Home Respiratory Rate (breaths/min): 16 Transportation: Private Auto Blood Pressure (mmHg): 156/84 Accompanied By: sister Schedule Follow-up Appointment: Yes Clinical Summary of Care: Patient Declined Electronic Signature(s) Signed: 03/02/2022 6:08:24 PM By: Dellie Catholic RN Entered By: Dellie Catholic on 03/02/2022 18:08:03 -------------------------------------------------------------------------------- Lower Extremity Assessment Details Patient Name: Date of Service: [name], [place] [name] 03/02/2022 12:30 PM Medical Record Number: [id number] Patient Account Number: [id number] Date of Birth/Sex: Treating RN: May 14, 1951 (71 y.o. [name] Primary Care Taaliyah Delpriore: [name] Other Clinician: Referring [name]: Treating [name]/Extender: Milagros Evener in Treatment: 12 Edema Assessment Assessed: Shirlyn Goltz: No] [name]: No] Edema: [Left: N] [Right: o] Calf Left: Right: Point of Measurement: From Medial Instep 34 cm Ankle Left: Right: Point of Measurement: From Medial Instep 22.8 cm Electronic Signature(s) Signed: 03/02/2022 6:08:24 PM By: Dellie Catholic RN Entered By: Dellie Catholic on 03/02/2022 12:52:47 -------------------------------------------------------------------------------- Multi Wound Chart Details Patient Name: Date of Service: [name], [name] 03/02/2022 12:30 PM Medical Record Number: [id number] Patient Account Number: [id number] Date of Birth/Sex: Treating RN: 06/24/1951 (71 y.o. M) Primary Care [name]: [name] Other Clinician: Referring [name]: Treating Coden Franchi/Extender: Milagros Evener in Treatment: 12 Vital Signs Height(in): Pulse(bpm): 88 Weight(lbs): Blood Pressure(mmHg): 156/84 Body Mass Index(BMI): Temperature(F): 97.5 Respiratory Rate(breaths/min): 16 Photos:  [N/A:N/A] Right Calcaneus N/A N/A Wound Location: Pressure Injury N/A N/A Wounding Event: Pressure Ulcer N/A N/A Primary Etiology: Cataracts, Hypertension, Cirrhosis , N/A N/A Comorbid History: Hepatitis C 07/02/2020 N/A N/A Date Acquired: 12 N/A N/A Weeks of Treatment: Open N/A N/A Wound Status: No N/A N/A Wound Recurrence: 1.7x1.7x0.4 N/A N/A Measurements L x W x D (cm) 2.27 N/A N/A A (cm) : rea 0.908 N/A N/A Volume (cm) : 90.80% N/A N/A % Reduction in A rea: 92.70% N/A N/A % Reduction in Volume: 1 Position 1 (o'clock): 0.3 Maximum Distance 1 (cm): 3 Starting Position  1 (o'clock): 7 Ending Position 1 (o'clock): 0.5 Maximum Distance 1 (cm): Yes N/A N/A Tunneling: Yes N/A N/A Undermining: Category/Stage IV N/A N/A Classification: Medium N/A N/A Exudate A mount: Serosanguineous N/A N/A Exudate Type: red, brown N/A N/A Exudate Color: Well defined, not attached N/A N/A Wound Margin: Large (67-100%) N/A N/A Granulation A mount: Red, Pink N/A N/A Granulation Quality: Small (1-33%) N/A N/A Necrotic A mount: Eschar, Adherent Slough N/A N/A Necrotic Tissue: Fascia: No N/A N/A Exposed Structures: Fat Layer (Subcutaneous Tissue): No Tendon: No Muscle: No Joint: No Bone: No Small (1-33%) N/A N/A Epithelialization: Debridement - Selective/Open Wound N/A N/A Debridement: Pre-procedure Verification/Time Out 13:05 N/A N/A Taken: Lidocaine 5% topical ointment N/A N/A Pain Control: Other, Slough N/A N/A Tissue Debrided: Non-Viable Tissue N/A N/A Level: 2.89 N/A N/A Debridement A (sq cm): rea Curette N/A N/A Instrument: Minimum N/A N/A Bleeding: Pressure N/A N/A Hemostasis A chieved: 0 N/A N/A Procedural Pain: 0 N/A N/A Post Procedural Pain: Procedure was tolerated well N/A N/A Debridement Treatment Response: 1.7x1.7x0.4 N/A N/A Post Debridement Measurements L x W x D (cm) 0.908 N/A N/A Post Debridement Volume: (cm) Category/Stage IV  N/A N/A Post Debridement Stage: Debridement N/A N/A Procedures Performed: Treatment Notes Electronic Signature(s) Signed: 03/02/2022 1:09:17 PM By: [name] MD FACS Entered By: [name] on 03/02/2022 13:09:17 -------------------------------------------------------------------------------- Multi-Disciplinary Care Plan Details Patient Name: Date of Service: [name], [name] 03/02/2022 12:30 PM Medical Record Number: [id number] Patient Account Number: [id number] Date of Birth/Sex: Treating RN: 1951/01/15 (71 y.o. [name] Primary Care Poetry Cerro: [name] Other Clinician: Referring Temperance Kelemen: Treating Kristianna Saperstein/Extender: Milagros Evener in Treatment: 12 Active Inactive Abuse / Safety / Falls / Self Care Management Nursing Diagnoses: History of Falls Impaired physical mobility Potential for falls Potential for injury related to falls Goals: Patient will remain injury free related to falls Date Initiated: 12/08/2021 Date Inactivated: 01/13/2022 Target Resolution Date: 01/05/2022 Goal Status: Met Patient/caregiver will verbalize understanding of skin care regimen Date Initiated: 12/08/2021 Target Resolution Date: 04/30/2022 Goal Status: Active Interventions: Assess fall risk on admission and as needed Assess: immobility, friction, shearing, incontinence upon admission and as needed Assess impairment of mobility on admission and as needed per policy Provide education on fall prevention Notes: Wound/Skin Impairment Nursing Diagnoses: Impaired tissue integrity Knowledge deficit related to ulceration/compromised skin integrity Goals: Patient/caregiver will verbalize understanding of skin care regimen Date Initiated: 12/08/2021 Target Resolution Date: 04/30/2022 Goal Status: Active Ulcer/skin breakdown will have a volume reduction of 30% by week 4 Date Initiated: 12/08/2021 Date Inactivated: 01/13/2022 Target Resolution Date:  01/05/2022 Goal Status: Met Interventions: Assess patient/caregiver ability to perform ulcer/skin care regimen upon admission and as needed Assess ulceration(s) every visit Provide education on ulcer and skin care Treatment Activities: Skin care regimen initiated : 12/08/2021 Topical wound management initiated : 12/08/2021 Notes: Electronic Signature(s) Signed: 03/02/2022 6:08:24 PM By: Dellie Catholic RN Entered By: Dellie Catholic on 03/02/2022 13:18:22 -------------------------------------------------------------------------------- Pain Assessment Details Patient Name: Date of Service: [name], [name] 03/02/2022 12:30 PM Medical Record Number: [id number] Patient Account Number: [id number] Date of Birth/Sex: Treating RN: 1951/04/10 (71 y.o. [name] Primary Care Foxx Klarich: [name] Other Clinician: Referring Cesar Alf: Treating Delight Bickle/Extender: Milagros Evener in Treatment: 12 Active Problems Location of Pain Severity and Description of Pain Patient Has Paino Yes Site Locations Pain Location: Pain Location: Generalized Pain With Dressing Change: Yes Duration of the Pain. Constant / Intermittento Constant Rate the pain.  Current Pain Level: 3 Worst Pain Level: 10 Least Pain Level: 3 Tolerable Pain Level: 3 Character of Pain Describe the Pain: Difficult to Pinpoint Pain Management and Medication Current Pain Management: Medication: No Cold Application: No Rest: Yes Massage: No Activity: No T.E.N.S.: No Heat Application: No Leg drop or elevation: No Is the Current Pain Management Adequate: Adequate How does your wound impact your activities of daily livingo Sleep: No Bathing: No Appetite: No Relationship With Others: No Bladder Continence: No Emotions: No Bowel Continence: No Work: No Toileting: No Drive: No Dressing: No Hobbies: No Electronic Signature(s) Signed: 03/02/2022 6:08:24 PM By: Dellie Catholic RN Entered  By: Dellie Catholic on 03/02/2022 12:52:40 -------------------------------------------------------------------------------- Patient/Caregiver Education Details Patient Name: Date of Service: [name] N 8/1/2023andnbsp12:30 PM Medical Record Number: [id number] Patient Account Number: [id number] Date of Birth/Gender: Treating RN: [birth year] (71 y.o. [name] Primary Care Physician: [name] Other Clinician: Referring Physician: Treating Physician/Extender: Milagros Evener in Treatment: 12 Education Assessment Education Provided To: Patient Education Topics Provided Wound/Skin Impairment: Methods: Explain/Verbal Responses: Return demonstration correctly Electronic Signature(s) Signed: 03/02/2022 6:08:24 PM By: Dellie Catholic RN Entered By: Dellie Catholic on 03/02/2022 18:07:05 -------------------------------------------------------------------------------- Wound Assessment Details Patient Name: Date of Service: [name], [name] 03/02/2022 12:30 PM Medical Record Number: [id number] Patient Account Number: [id number] Date of Birth/Sex: Treating RN: 09-10-1950 (71 y.o. [name] Primary Care Janny Crute: [name] Other Clinician: Referring Carmin Dibartolo: Treating Adaiah Jaskot/Extender: Milagros Evener in Treatment: 12 Wound Status Wound Number: 1 Primary Etiology: Pressure Ulcer Wound Location: Right Calcaneus Wound Status: Open Wounding Event: Pressure Injury Comorbid History: Cataracts, Hypertension, Cirrhosis , Hepatitis C Date Acquired: 07/02/2020 Weeks Of Treatment: 12 Clustered Wound: No Photos Wound Measurements Length: (cm) 1.7 Width: (cm) 1.7 Depth: (cm) 0.4 Area: (cm) 2.27 Volume: (cm) 0.908 % Reduction in Area: 90.8% % Reduction in Volume: 92.7% Epithelialization: Small (1-33%) Tunneling: Yes Position (o'clock): 1 Maximum Distance: (cm) 0.3 Undermining: Yes Starting  Position (o'clock): 3 Ending Position (o'clock): 7 Maximum Distance: (cm) 0.5 Wound Description Classification: Category/Stage IV Wound Margin: Well defined, not attached Exudate Amount: Medium Exudate Type: Serosanguineous Exudate Color: red, brown Foul Odor After Cleansing: No Slough/Fibrino Yes Wound Bed Granulation Amount: Large (67-100%) Exposed Structure Granulation Quality: Red, Pink Fascia Exposed: No Necrotic Amount: Small (1-33%) Fat Layer (Subcutaneous Tissue) Exposed: No Necrotic Quality: Eschar, Adherent Slough Tendon Exposed: No Muscle Exposed: No Joint Exposed: No Bone Exposed: No Treatment Notes Wound #1 (Calcaneus) Wound Laterality: Right Cleanser Soap and Water Discharge Instruction: May shower and wash wound with dial antibacterial soap and water prior to dressing change. Wound Cleanser Discharge Instruction: Cleanse the wound with wound cleanser prior to applying a clean dressing using gauze sponges, not tissue or cotton balls. Peri-Wound Care Topical Keystone antibiotic compound Discharge Instruction: Apply directly to wound bed once available Primary Dressing Secondary Dressing ALLEVYN Heel 4 1/2in x 5 1/2in / 10.5cm x 13.5cm Discharge Instruction: Apply over primary dressing as directed. Secured With Elastic Bandage 4 inch (ACE bandage) Discharge Instruction: Secure with ACE bandage as directed. Kerlix Roll Sterile, 4.5x3.1 (in/yd) Discharge Instruction: Secure with Kerlix as directed. 17M Medipore Soft Cloth Surgical T 2x10 (in/yd) ape Discharge Instruction: Secure with tape as directed. Compression Wrap Compression Stockings Add-Ons Electronic Signature(s) Signed: 03/02/2022 6:08:24 PM By: Dellie Catholic RN Entered By: Dellie Catholic on 03/02/2022 13:00:01 -------------------------------------------------------------------------------- Vitals Details Patient Name: Date of Service: [name], [name] 03/02/2022 12:30  PM Medical Record  Number: [id number] Patient Account Number: [id number] Date of Birth/Sex: Treating RN: [birth year] (71 y.o. [name] Primary Care Chance Munter: [name] Other Clinician: Referring Khalel Alms: Treating Vickii Volland/Extender: Milagros Evener in Treatment: 12 Vital Signs Time Taken: 12:47 Temperature (F): 97.5 Pulse (bpm): 53 Respiratory Rate (breaths/min): 16 Blood Pressure (mmHg): 156/84 Reference Range: 80 - 120 mg / dl Electronic Signature(s) Signed: 03/02/2022 6:08:24 PM By: Dellie Catholic RN Entered By: Dellie Catholic on 03/02/2022 12:47:45

## 2022-03-16 ENCOUNTER — Encounter (HOSPITAL_BASED_OUTPATIENT_CLINIC_OR_DEPARTMENT_OTHER): Payer: 59 | Admitting: General Surgery

## 2022-03-16 DIAGNOSIS — L89610 Pressure ulcer of right heel, unstageable: Secondary | ICD-10-CM | POA: Diagnosis not present

## 2022-03-17 NOTE — Progress Notes (Addendum)
BENINO, KORINEK ([id number]) Visit Report for 03/16/2022 Arrival Information Details Patient Name: Date of Service: [name] [place] 03/16/2022 12:30 PM Medical Record Number: [id number] Patient Account Number: [id number] Date of Birth/Sex: Treating RN: 04-13-1951 (71 y.o. [name] Primary Care Fauna Neuner: [name] Other Clinician: Referring Marshun Duva: Treating Jayleon Mcfarlane/Extender: Milagros Evener in Treatment: 14 Visit Information History Since Last Visit Added or deleted any medications: No Patient Arrived: Wheel Chair Any new allergies or adverse reactions: No Arrival Time: 12:32 Had a fall or experienced change in No Accompanied By: sister activities of daily living that may affect Transfer Assistance: Manual risk of falls: Patient Identification Verified: Yes Signs or symptoms of abuse/neglect since last visito No Patient Requires Transmission-Based Precautions: No Hospitalized since last visit: No Patient Has Alerts: Yes Implantable device outside of the clinic excluding No Patient Alerts: Patient on Blood Thinner cellular tissue based products placed in the center since last visit: Has Dressing in Place as Prescribed: Yes Pain Present Now: No Electronic Signature(s) Signed: 03/16/2022 5:27:21 PM By: Dellie Catholic RN Entered By: Dellie Catholic on 03/16/2022 12:51:46 -------------------------------------------------------------------------------- Complex / Palliative Patient Assessment Details Patient Name: Date of Service: [name], [place] [name] 03/16/2022 12:30 PM Medical Record Number: [id number] Patient Account Number: [id number] Date of Birth/Sex: Treating RN: 24-Nov-1950 (71 y.o. [name] Primary Care Guinevere Stephenson: [name] Other Clinician: Referring Breindy Meadow: Treating Veer Elamin/Extender: Milagros Evener in Treatment: 14 Complex Wound Management Criteria Patient has remarkable or  complex co-morbidities requiring medications or treatments that extend wound healing times. 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 Diabetes mellitus and end stage chronic obstructive pulmonary disease Active cancer with current chemo- or radiation therapy CVA with residual, uncontrolled htn, liver cirrhosis, s/p hip fracture Palliative Wound Management Criteria Care Approach Wound Care Plan: Complex Wound Management Electronic Signature(s) Signed: 03/19/2022 3:13:02 PM By: Baruch Gouty RN, BSN Signed: 03/19/2022 4:05:21 PM By: [name] MD FACS Entered By: Baruch Gouty on 03/19/2022 15:13:02 -------------------------------------------------------------------------------- Encounter Discharge Information Details Patient Name: Date of Service: [name], [name] 03/16/2022 12:30 PM Medical Record Number: [id number] Patient Account Number: [id number] Date of Birth/Sex: Treating RN: 11/24/1950 (71 y.o. [name] Primary Care Sobia Karger: [name] Other Clinician: Referring Tonny Isensee: Treating Myan Suit/Extender: Milagros Evener in Treatment: 14 Encounter Discharge Information Items Post Procedure Vitals Discharge Condition: Stable Temperature (F): 97.6 Ambulatory Status: Wheelchair Pulse (bpm): 53 Discharge Destination: Home Respiratory Rate (breaths/min): 16 Transportation: Private Auto Blood Pressure (mmHg): 143/83 Accompanied By: sister Schedule Follow-up Appointment: Yes Clinical Summary of Care: Patient Declined Electronic Signature(s) Signed: 03/16/2022 5:27:21 PM By: Dellie Catholic RN Entered By: Dellie Catholic on 03/16/2022 17:21:31 -------------------------------------------------------------------------------- Lower Extremity Assessment Details Patient Name: Date of Service: [name], [name] 03/16/2022  12:30 PM Medical Record Number: [id number] Patient Account Number: [id number] Date of Birth/Sex: Treating RN: 22-May-1951 (70 y.o. [name] Primary Care Amairani Shuey: [name] Other Clinician: Referring Voris Tigert: Treating Ermine Spofford/Extender: Milagros Evener in Treatment: 14 Edema Assessment Assessed: Shirlyn Goltz: No] [name]: No] Edema: [Left: N] [Right: o] Calf Left: Right: Point of Measurement: From Medial Instep 32.9 cm Ankle Left: Right: Point of Measurement: From Medial Instep 22.5 cm Electronic Signature(s) Signed: 03/16/2022 5:27:21 PM By: Dellie Catholic RN Entered By: Dellie Catholic on 03/16/2022 12:52:31 -------------------------------------------------------------------------------- Multi Wound Chart Details Patient Name: Date of  Service: [name], [name] 03/16/2022 12:30 PM Medical Record Number: [id number] Patient Account Number: [id number] Date of Birth/Sex: Treating RN: 1951/03/24 (71 y.o. [name] Primary Care Ronalda Walpole: [name] Other Clinician: Referring Ledford Goodson: Treating Mikhaila Roh/Extender: Milagros Evener in Treatment: 14 Vital Signs Height(in): Pulse(bpm): 38 Weight(lbs): Blood Pressure(mmHg): 143/83 Body Mass Index(BMI): Temperature(F): 97.6 Respiratory Rate(breaths/min): 16 Photos: [N/A:No Photos N/A] Right Calcaneus Right Calcaneus N/A Wound Location: Pressure Injury Pressure Injury N/A Wounding Event: Pressure Ulcer Pressure Ulcer N/A Primary Etiology: Cataracts, Hypertension, Cirrhosis , Cataracts, Hypertension, Cirrhosis , N/A Comorbid History: Hepatitis C Hepatitis C 07/02/2020 07/02/2020 N/A Date Acquired: 14 14 N/A Weeks of Treatment: Open Open N/A Wound Status: No No N/A Wound Recurrence: 1.7x1.7x0.2 1.7x1.7x0.2 N/A Measurements L x W x D (cm) 2.27 2.27 N/A A (cm) : rea 0.454 0.454 N/A Volume (cm) : 90.80% 90.80% N/A % Reduction in A rea: 96.30%  96.30% N/A % Reduction in Volume: 1 1 Position 1 (o'clock): 0.3 0.3 Maximum Distance 1 (cm): 3 3 Starting Position 1 (o'clock): 7 7 Ending Position 1 (o'clock): 0.6 0.5 Maximum Distance 1 (cm): Yes Yes N/A Tunneling: Yes Yes N/A Undermining: Category/Stage IV Category/Stage IV N/A Classification: Medium Medium N/A Exudate A mount: Serosanguineous Serosanguineous N/A Exudate Type: red, brown red, brown N/A Exudate Color: Well defined, not attached Well defined, not attached N/A Wound Margin: Large (67-100%) Large (67-100%) N/A Granulation A mount: Red, Pink Red, Pink N/A Granulation Quality: Small (1-33%) Small (1-33%) N/A Necrotic A mount: Eschar, Adherent Slough Eschar, Adherent Slough N/A Necrotic Tissue: Fat Layer (Subcutaneous Tissue): Yes Fat Layer (Subcutaneous Tissue): Yes N/A Exposed Structures: Fascia: No Fascia: No Tendon: No Tendon: No Muscle: No Muscle: No Joint: No Joint: No Bone: No Bone: No Small (1-33%) Small (1-33%) N/A Epithelialization: Debridement - Selective/Open Wound Debridement - Selective/Open Wound N/A Debridement: Pre-procedure Verification/Time Out 12:50 12:50 N/A Taken: Lidocaine 4% Topical Solution Lidocaine 4% Topical Solution N/A Pain Control: Other, Slough Other, Slough N/A Tissue Debrided: Non-Viable Tissue Non-Viable Tissue N/A Level: 2.89 2.89 N/A Debridement A (sq cm): rea Curette Curette N/A Instrument: Minimum Minimum N/A Bleeding: Pressure Pressure N/A Hemostasis A chieved: 0 0 N/A Procedural Pain: 0 0 N/A Post Procedural Pain: Procedure was tolerated well Procedure was tolerated well N/A Debridement Treatment Response: 1.7x1.7x0.2 1.7x1.7x0.2 N/A Post Debridement Measurements L x W x D (cm) 0.454 0.454 N/A Post Debridement Volume: (cm) Category/Stage IV Category/Stage IV N/A Post Debridement Stage: Debridement Debridement N/A Procedures Performed: Treatment Notes Electronic  Signature(s) Signed: 03/16/2022 1:11:25 PM By: [name] MD FACS Signed: 03/16/2022 5:27:21 PM By: Dellie Catholic RN Entered By: [name] on 03/16/2022 13:11:25 -------------------------------------------------------------------------------- [place] Details Patient Name: Date of Service: [name], [place] [name] 03/16/2022 12:30 PM Medical Record Number: [id number] Patient Account Number: [id number] Date of Birth/Sex: Treating RN: 02/27/1951 (71 y.o. [name] Primary Care Merideth Bosque: [name] Other Clinician: Referring Davon Folta: Treating Chennel Olivos/Extender: Milagros Evener in Treatment: 14 Active Inactive Abuse / Safety / Falls / Self Care Management Nursing Diagnoses: History of Falls Impaired physical mobility Potential for falls Potential for injury related to falls Goals: Patient will remain injury free related to falls Date Initiated: 12/08/2021 Date Inactivated: 01/13/2022 Target Resolution Date: 01/05/2022 Goal Status: Met Patient/caregiver will verbalize understanding of skin care regimen Date Initiated: 12/08/2021 Target Resolution Date: 07/01/2022 Goal Status: Active Interventions: Assess fall risk on admission and as needed Assess: immobility, friction, shearing, incontinence upon admission and as  needed Assess impairment of mobility on admission and as needed per policy Provide education on fall prevention Notes: Wound/Skin Impairment Nursing Diagnoses: Impaired tissue integrity Knowledge deficit related to ulceration/compromised skin integrity Goals: Patient/caregiver will verbalize understanding of skin care regimen Date Initiated: 12/08/2021 Target Resolution Date: 07/01/2022 Goal Status: Active Ulcer/skin breakdown will have a volume reduction of 30% by week 4 Date Initiated: 12/08/2021 Date Inactivated: 01/13/2022 Target Resolution Date: 01/05/2022 Goal Status: Met Interventions: Assess  patient/caregiver ability to perform ulcer/skin care regimen upon admission and as needed Assess ulceration(s) every visit Provide education on ulcer and skin care Treatment Activities: Skin care regimen initiated : 12/08/2021 Topical wound management initiated : 12/08/2021 Notes: Electronic Signature(s) Signed: 03/16/2022 5:27:21 PM By: Dellie Catholic RN Entered By: Dellie Catholic on 03/16/2022 17:17:32 -------------------------------------------------------------------------------- Pain Assessment Details Patient Name: Date of Service: [name], [place] [name] 03/16/2022 12:30 PM Medical Record Number: [id number] Patient Account Number: [id number] Date of Birth/Sex: Treating RN: [birth year] (71 y.o. [name] Primary Care Corneisha Alvi: [name] Other Clinician: Referring Nijah Orlich: Treating Azaan Leask/Extender: Milagros Evener in Treatment: 14 Active Problems Location of Pain Severity and Description of Pain Patient Has Paino No Site Locations Pain Management and Medication Current Pain Management: Electronic Signature(s) Signed: 03/16/2022 5:27:21 PM By: Dellie Catholic RN Entered By: Dellie Catholic on 03/16/2022 12:52:26 -------------------------------------------------------------------------------- Patient/Caregiver Education Details Patient Name: Date of Service: [name] N 8/15/2023andnbsp12:30 PM Medical Record Number: [id number] Patient Account Number: [id number] Date of Birth/Gender: Treating RN: Jun 18, 1951 (71 y.o. [name] Primary Care Physician: [name] Other Clinician: Referring Physician: Treating Physician/Extender: Milagros Evener in Treatment: 14 Education Assessment Education Provided To: Patient Education Topics Provided Wound/Skin Impairment: Methods: Explain/Verbal Responses: Return demonstration correctly Electronic Signature(s) Signed: 03/16/2022 5:27:21 PM By:  Dellie Catholic RN Entered By: Dellie Catholic on 03/16/2022 17:17:41 -------------------------------------------------------------------------------- Wound Assessment Details Patient Name: Date of Service: [name], [place] [name] 03/16/2022 12:30 PM Medical Record Number: [id number] Patient Account Number: [id number] Date of Birth/Sex: Treating RN: [birth year] (71 y.o. [name] Primary Care Bevan Vu: [name] Other Clinician: Referring Alee Gressman: Treating Teion Ballin/Extender: Milagros Evener in Treatment: 14 Wound Status Wound Number: 1 Primary Etiology: Pressure Ulcer Wound Location: Right Calcaneus Wound Status: Open Wounding Event: Pressure Injury Comorbid History: Cataracts, Hypertension, Cirrhosis , Hepatitis C Date Acquired: 07/02/2020 Weeks Of Treatment: 14 Clustered Wound: No Photos Wound Measurements Length: (cm) 1.7 Width: (cm) 1.7 Depth: (cm) 0.2 Area: (cm) 2.27 Volume: (cm) 0.454 % Reduction in Area: 90.8% % Reduction in Volume: 96.3% Epithelialization: Small (1-33%) Tunneling: Yes Position (o'clock): 1 Maximum Distance: (cm) 0.3 Undermining: Yes Starting Position (o'clock): 3 Ending Position (o'clock): 7 Maximum Distance: (cm) 0.6 Wound Description Classification: Category/Stage IV Wound Margin: Well defined, not attached Exudate Amount: Medium Exudate Type: Serosanguineous Exudate Color: red, brown Foul Odor After Cleansing: No Slough/Fibrino Yes Wound Bed Granulation Amount: Large (67-100%) Exposed Structure Granulation Quality: Red, Pink Fascia Exposed: No Necrotic Amount: Small (1-33%) Fat Layer (Subcutaneous Tissue) Exposed: Yes Necrotic Quality: Eschar, Adherent Slough Tendon Exposed: No Muscle Exposed: No Joint Exposed: No Bone Exposed: No Electronic Signature(s) Signed: 03/16/2022 5:27:21 PM By: Dellie Catholic RN Entered By: Dellie Catholic on 03/16/2022  12:47:15 -------------------------------------------------------------------------------- Wound Assessment Details Patient Name: Date of Service: [name], [place] [name] 03/16/2022 12:30 PM Medical Record Number: [id number] Patient Account Number: [id number] Date of Birth/Sex: Treating RN: 08-Jan-1951 (71 y.o. [name] Primary Care Javaya [place]:  [name] Other Clinician: Referring Justyn Boyson: Treating Jerris Fleer/Extender: Milagros Evener in Treatment: 14 Wound Status Wound Number: 1 Primary Etiology: Pressure Ulcer Wound Location: Right Calcaneus Wound Status: Open Wounding Event: Pressure Injury Comorbid History: Cataracts, Hypertension, Cirrhosis , Hepatitis C Date Acquired: 07/02/2020 Weeks Of Treatment: 14 Clustered Wound: No Wound Measurements Length: (cm) 1.7 Width: (cm) 1.7 Depth: (cm) 0.2 Area: (cm) 2.27 Volume: (cm) 0.454 % Reduction in Area: 90.8% % Reduction in Volume: 96.3% Epithelialization: Small (1-33%) Tunneling: Yes Position (o'clock): 1 Maximum Distance: (cm) 0.3 Undermining: Yes Starting Position (o'clock): 3 Ending Position (o'clock): 7 Maximum Distance: (cm) 0.5 Wound Description Classification: Category/Stage IV Wound Margin: Well defined, not attached Exudate Amount: Medium Exudate Type: Serosanguineous Exudate Color: red, brown Foul Odor After Cleansing: No Slough/Fibrino Yes Wound Bed Granulation Amount: Large (67-100%) Exposed Structure Granulation Quality: Red, Pink Fascia Exposed: No Necrotic Amount: Small (1-33%) Fat Layer (Subcutaneous Tissue) Exposed: Yes Necrotic Quality: Eschar, Adherent Slough Tendon Exposed: No Muscle Exposed: No Joint Exposed: No Bone Exposed: No Treatment Notes Wound #1 (Calcaneus) Wound Laterality: Right Cleanser Soap and Water Discharge Instruction: May shower and wash wound with dial antibacterial soap and water prior to dressing change. Wound Cleanser Discharge  Instruction: Cleanse the wound with wound cleanser prior to applying a clean dressing using gauze sponges, not tissue or cotton balls. Peri-Wound Care wet to dry Discharge Instruction: in clinic;wound vac with HH Topical Keystone antibiotic compound Discharge Instruction: Apply directly to wound bed use until finished Primary Dressing Secondary Dressing ALLEVYN Heel 4 1/2in x 5 1/2in / 10.5cm x 13.5cm Discharge Instruction: Apply over primary dressing as directed. Secured With Elastic Bandage 4 inch (ACE bandage) Discharge Instruction: Secure with ACE bandage as directed. Kerlix Roll Sterile, 4.5x3.1 (in/yd) Discharge Instruction: Secure with Kerlix as directed. 18M Medipore Soft Cloth Surgical T 2x10 (in/yd) ape Discharge Instruction: Secure with tape as directed. Compression Wrap Compression Stockings Add-Ons foot cover Discharge Instruction: use foot covers Electronic Signature(s) Signed: 03/16/2022 5:27:21 PM By: Dellie Catholic RN Entered By: Dellie Catholic on 03/16/2022 12:47:51 -------------------------------------------------------------------------------- [place] Details Patient Name: Date of Service: [name], [name] 03/16/2022 12:30 PM Medical Record Number: [id number] Patient Account Number: [id number] Date of Birth/Sex: Treating RN: [birth year] (71 y.o. [name] Primary Care Nitisha Civello: [name] Other Clinician: Referring Wanda Cellucci: Treating Nadelyn Enriques/Extender: Milagros Evener in Treatment: 14 Vital Signs Time Taken: 12:33 Temperature (F): 97.6 Pulse (bpm): 53 Respiratory Rate (breaths/min): 16 Blood Pressure (mmHg): 143/83 Reference Range: 80 - 120 mg / dl Electronic Signature(s) Signed: 03/16/2022 5:27:21 PM By: Dellie Catholic RN Entered By: Dellie Catholic on 03/16/2022 12:52:20

## 2022-03-17 NOTE — Progress Notes (Signed)
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 in 2 weeks. Electronic Signature(s) Signed: 03/16/2022 1:16:39 PM By: [name] MD FACS Entered By: [name] on 03/16/2022 13:16:38 -------------------------------------------------------------------------------- HxROS Details Patient Name: Date of Service: [name], [name] 03/16/2022 12:30 PM Medical Record Number: [id number] Patient Account Number: [id number] Date of Birth/Sex: Treating RN: 05/27/1951 (71 y.o. [name] Primary Care Provider: [name] Other Clinician: Referring Provider: Treating Provider/Extender: Milagros Evener in Treatment: 14 Information Obtained From Patient Eyes Medical History: Positive for:  Cataracts Hematologic/Lymphatic Medical History: Past Medical History Notes: hyperlipidemia Cardiovascular Medical History: Positive for: Hypertension Gastrointestinal Medical History: Positive for: Cirrhosis ; Hepatitis C - says it is gone Past Medical History Notes: colon polyps Genitourinary Medical History: Past Medical History Notes: BPH Musculoskeletal Medical History: Past Medical History Notes: arthritis Neurologic Medical History: Past Medical History Notes: stroke HBO Extended History Items Eyes: Cataracts Immunizations Pneumococcal Vaccine: Received Pneumococcal Vaccination: Yes Received Pneumococcal Vaccination On or After 60th Birthday: Yes Implantable Devices None Hospitalization / Surgery History Type of Hospitalization/Surgery inguinal hernia repair knee surgery x4 Family and Social History Cancer: Yes - Mother,Father; Diabetes: Yes - Siblings; Heart Disease: Yes - Maternal Grandparents; Hereditary Spherocytosis: No; Hypertension: Yes - Mother; Kidney Disease: No; Lung Disease: Yes - Mother; Seizures: No; Stroke: Yes - Mother; Thyroid Problems: Yes - Siblings; Tuberculosis: No; Former smoker - ended on 09/25/1982; Marital Status - Single; Alcohol Use: Never; Drug Use: Prior History - cocaine, heroin, marijuana; Caffeine Use: Never; Financial Concerns: No; Food, Clothing or Shelter Needs: No; Support System Lacking: Yes; Transportation Concerns: No Electronic Signature(s) Signed: 03/16/2022 3:21:56 PM By: [name] MD FACS Signed: 03/16/2022 5:27:21 PM By: Dellie Catholic RN Entered By: [name] on 03/16/2022 13:13:47 -------------------------------------------------------------------------------- SuperBill Details Patient Name: Date of Service: [name], [name] 03/16/2022 Medical Record Number: [id number] Patient Account Number: [id number] Date of Birth/Sex: Treating RN: February 12, 1951 (71 y.o. [name] Primary Care  Provider: [name] Other Clinician: Referring Provider: Treating Provider/Extender: Milagros Evener in Treatment: 14 Diagnosis Coding ICD-10 Codes Code Description L89.610 Pressure ulcer of right heel, unstageable Z86.73 Personal history of transient ischemic attack (TIA), and cerebral infarction without residual deficits K74.60 Unspecified cirrhosis of liver B18.2 Chronic viral hepatitis C I63.9 Cerebral infarction, unspecified I10 Essential (primary) hypertension Facility Procedures CPT4 Code: [id number] Description: [phone] - DEBRIDE WOUND 1ST 20 SQ CM OR < ICD-10 Diagnosis Description L89.610 Pressure ulcer of right heel, unstageable Modifier: Quantity: 1 Physician Procedures : CPT4 Code Description Modifier [id number] - WC PHYS LEVEL 4 - EST PT 25 ICD-10 Diagnosis Description L89.610  Pressure ulcer of right heel, unstageable Z86.73 Personal history of transient ischemic attack (TIA), and cerebral infarction without  residual deficits K74.60 Unspecified cirrhosis of liver I10 Essential (primary) hypertension Quantity: 1 : [id number] - WC PHYS DEBR WO ANESTH 20 SQ CM ICD-10 Diagnosis Description L89.610 Pressure ulcer of right heel, unstageable Quantity: 1 Electronic Signature(s) Signed: 03/16/2022 1:17:09 PM By: [name] MD FACS Entered By: [name] on 03/16/2022 13:17:09

## 2022-03-30 ENCOUNTER — Encounter (HOSPITAL_BASED_OUTPATIENT_CLINIC_OR_DEPARTMENT_OTHER): Payer: 59 | Admitting: General Surgery

## 2022-03-30 DIAGNOSIS — L89610 Pressure ulcer of right heel, unstageable: Secondary | ICD-10-CM | POA: Diagnosis not present

## 2022-03-31 NOTE — Progress Notes (Signed)
[name] ([id number]) Visit Report for 03/30/2022 Arrival Information Details Patient Name: Date of Service: [name] 03/30/2022 12:30 PM Medical Record Number: [id number] Patient Account Number: [id number] Date of Birth/Sex: Treating RN: 1951-06-23 (71 y.o. [name] Primary Care Janijah Symons: [name] Other Clinician: Referring Karem Farha: Treating Andrews Tener/Extender: Milagros Evener in Treatment: 16 Visit Information History Since Last Visit Added or deleted any medications: No Patient Arrived: Wheel Chair Any new allergies or adverse reactions: No Arrival Time: 12:42 Had a fall or experienced change in No Accompanied By: sister activities of daily living that may affect Transfer Assistance: None risk of falls: Patient Identification Verified: Yes Signs or symptoms of abuse/neglect since last visito No Secondary Verification Process Completed: Yes Hospitalized since last visit: No Patient Requires Transmission-Based Precautions: No Implantable device outside of the clinic excluding No Patient Has Alerts: Yes cellular tissue based products placed in the center Patient Alerts: Patient on Blood Thinner since last visit: Has Dressing in Place as Prescribed: Yes Has Footwear/Offloading in Place as Prescribed: Yes Left: Wedge Shoe Pain Present Now: No Electronic Signature(s) Signed: 03/31/2022 4:37:26 PM By: Sharyn Creamer RN, BSN Entered By: Sharyn Creamer on 03/30/2022 12:42:57 -------------------------------------------------------------------------------- Encounter Discharge Information Details Patient Name: Date of Service: [name], [name] 03/30/2022 12:30 PM Medical Record Number: [id number] Patient Account Number: [id number] Date of Birth/Sex: Treating RN: 05/12/1951 (71 y.o. [name] Primary Care Ileta Ofarrell: [name] Other Clinician: Referring [name]: Treating Jewelle Whitner/Extender: Milagros Evener in Treatment: 16 Encounter Discharge Information Items Post Procedure Vitals Discharge Condition: Stable Temperature (F): 97.6 Ambulatory Status: Wheelchair Pulse (bpm): 55 Discharge Destination: Home Respiratory Rate (breaths/min): 18 Transportation: Private Auto Blood Pressure (mmHg): 136/81 Accompanied By: sister Schedule Follow-up Appointment: Yes Clinical Summary of Care: Patient Declined Electronic Signature(s) Signed: 03/31/2022 4:37:26 PM By: Sharyn Creamer RN, BSN Entered By: Sharyn Creamer on 03/30/2022 13:32:01 -------------------------------------------------------------------------------- Lower Extremity Assessment Details Patient Name: Date of Service: [name], [place] [name] 03/30/2022 12:30 PM Medical Record Number: [id number] Patient Account Number: [id number] Date of Birth/Sex: Treating RN: Dec 28, 1950 (71 y.o. [name] Primary Care Merita Hawks: [name] Other Clinician: Referring Delquan Poucher: Treating Ociel Retherford/Extender: Milagros Evener in Treatment: 16 Edema Assessment Assessed: Shirlyn Goltz: No] [name]: No] Edema: [Left: N] [Right: o] Calf Left: Right: Point of Measurement: From Medial Instep 32.5 cm Ankle Left: Right: Point of Measurement: From Medial Instep 22.1 cm Vascular Assessment Pulses: Dorsalis Pedis Palpable: [Right:Yes] Electronic Signature(s) Signed: 03/31/2022 4:37:26 PM By: Sharyn Creamer RN, BSN Entered By: Sharyn Creamer on 03/30/2022 12:51:23 -------------------------------------------------------------------------------- Multi Wound Chart Details Patient Name: Date of Service: [name], [name] 03/30/2022 12:30 PM Medical Record Number: [id number] Patient Account Number: [id number] Date of Birth/Sex: Treating RN: 1950-12-05 (71 y.o. [name] Primary Care Corlene Sabia: [name] Other Clinician: Referring Solina Heron: Treating Sagal Gayton/Extender: Milagros Evener in Treatment: 16 Vital Signs Height(in): Pulse(bpm): 55 Weight(lbs): Blood Pressure(mmHg): 136/81 Body Mass Index(BMI): Temperature(F): 97.6 Respiratory Rate(breaths/min): 18 Photos: [1:Right Calcaneus] [N/A:N/A N/A] Wound Location: [1:Pressure Injury] [N/A:N/A] Wounding Event: [1:Pressure Ulcer] [N/A:N/A] Primary Etiology: [1:Cataracts, Hypertension, Cirrhosis , N/A] Comorbid History: [1:Hepatitis C 07/02/2020] [N/A:N/A] Date Acquired: [1:16] [N/A:N/A] Weeks of Treatment: [1:Open] [N/A:N/A] Wound Status: [1:No] [N/A:N/A] Wound Recurrence: [1:1.9x2.2x0.2] [N/A:N/A] Measurements L x W x D (cm) [1:3.283] [N/A:N/A] A (cm) : rea [1:0.657] [N/A:N/A] Volume (cm) : [1:86.70%] [N/A:N/A] % Reduction in A [1:rea: 94.70%] [N/A:N/A] % Reduction  in Volume: [1:12] Starting Position 1 (o'clock): [1:7] Ending Position 1 (o'clock): [1:1.2] Maximum Distance 1 (cm): [1:Yes] [N/A:N/A] Undermining: [1:Category/Stage IV] [N/A:N/A] Classification: [1:Medium] [N/A:N/A] Exudate A mount: [1:Serosanguineous] [N/A:N/A] Exudate Type: [1:red, brown] [N/A:N/A] Exudate Color: [1:Well defined, not attached] [N/A:N/A] Wound Margin: [1:Large (67-100%)] [N/A:N/A] Granulation A mount: [1:Red, Pink] [N/A:N/A] Granulation Quality: [1:None Present (0%)] [N/A:N/A] Necrotic A mount: [1:Fat Layer (Subcutaneous Tissue): Yes N/A] Exposed Structures: [1:Fascia: No Tendon: No Muscle: No Joint: No Bone: No Small (1-33%)] [N/A:N/A] Epithelialization: [1:Debridement - Excisional] [N/A:N/A] Debridement: Pre-procedure Verification/Time Out 12:58 [N/A:N/A] Taken: [1:Lidocaine 5% topical ointment] [N/A:N/A] Pain Control: [1:Subcutaneous, Slough] [N/A:N/A] Tissue Debrided: [1:Skin/Subcutaneous Tissue] [N/A:N/A] Level: [1:4.18] [N/A:N/A] Debridement A (sq cm): [1:rea Curette] [N/A:N/A] Instrument: [1:Minimum] [N/A:N/A] Bleeding: [1:Pressure] [N/A:N/A] Hemostasis A chieved: [1:0]  [N/A:N/A] Procedural Pain: [1:0] [N/A:N/A] Post Procedural Pain: [1:Procedure was tolerated well] [N/A:N/A] Debridement Treatment Response: [1:1.9x2.2x0.2] [N/A:N/A] Post Debridement Measurements L x W x D (cm) [1:0.657] [N/A:N/A] Post Debridement Volume: (cm) [1:Category/Stage IV] [N/A:N/A] Post Debridement Stage: [1:Debridement] [N/A:N/A] Treatment Notes Electronic Signature(s) Signed: 03/30/2022 1:19:26 PM By: [name] MD FACS Signed: 03/31/2022 5:36:41 PM By: Dellie Catholic RN Entered By: [name] on 03/30/2022 13:19:26 -------------------------------------------------------------------------------- Multi-Disciplinary Care Plan Details Patient Name: Date of Service: [name], [place] [name] 03/30/2022 12:30 PM Medical Record Number: [id number] Patient Account Number: [id number] Date of Birth/Sex: Treating RN: 07/05/1951 (71 y.o. [name] Primary Care Baldemar Dady: [name] Other Clinician: Referring Raena Pau: Treating Tehya Leath/Extender: Milagros Evener in Treatment: 16 Active Inactive Abuse / Safety / Falls / Self Care Management Nursing Diagnoses: History of Falls Impaired physical mobility Potential for falls Potential for injury related to falls Goals: Patient will remain injury free related to falls Date Initiated: 12/08/2021 Date Inactivated: 01/13/2022 Target Resolution Date: 01/05/2022 Goal Status: Met Patient/caregiver will verbalize understanding of skin care regimen Date Initiated: 12/08/2021 Target Resolution Date: 07/01/2022 Goal Status: Active Interventions: Assess fall risk on admission and as needed Assess: immobility, friction, shearing, incontinence upon admission and as needed Assess impairment of mobility on admission and as needed per policy Provide education on fall prevention Notes: Wound/Skin Impairment Nursing Diagnoses: Impaired tissue integrity Knowledge deficit related to ulceration/compromised  skin integrity Goals: Patient/caregiver will verbalize understanding of skin care regimen Date Initiated: 12/08/2021 Target Resolution Date: 07/01/2022 Goal Status: Active Ulcer/skin breakdown will have a volume reduction of 30% by week 4 Date Initiated: 12/08/2021 Date Inactivated: 01/13/2022 Target Resolution Date: 01/05/2022 Goal Status: Met Interventions: Assess patient/caregiver ability to perform ulcer/skin care regimen upon admission and as needed Assess ulceration(s) every visit Provide education on ulcer and skin care Treatment Activities: Skin care regimen initiated : 12/08/2021 Topical wound management initiated : 12/08/2021 Notes: Electronic Signature(s) Signed: 03/31/2022 4:37:26 PM By: Sharyn Creamer RN, BSN Entered By: Sharyn Creamer on 03/30/2022 13:30:36 -------------------------------------------------------------------------------- Pain Assessment Details Patient Name: Date of Service: [name], [name] 03/30/2022 12:30 PM Medical Record Number: [id number] Patient Account Number: [id number] Date of Birth/Sex: Treating RN: 1950/11/13 (71 y.o. [name] Primary Care Macoy Rodwell: [name] Other Clinician: Referring Tenesha Garza: Treating Azelyn Batie/Extender: Milagros Evener in Treatment: 16 Active Problems Location of Pain Severity and Description of Pain Patient Has Paino No Site Locations Pain Management and Medication Current Pain Management: Electronic Signature(s) Signed: 03/31/2022 4:37:26 PM By: Sharyn Creamer RN, BSN Entered By: Sharyn Creamer on 03/30/2022 12:43:41 -------------------------------------------------------------------------------- Patient/Caregiver Education Details Patient Name: Date of Service: [name] N 8/29/2023andnbsp12:30 PM Medical Record Number: [id number]  Patient Account Number: [id number] Date of Birth/Gender: Treating RN: [birth year] (71 y.o. [name] Primary Care Physician:  [name] Other Clinician: Referring Physician: Treating Physician/Extender: Milagros Evener in Treatment: 16 Education Assessment Education Provided To: Patient Education Topics Provided Wound/Skin Impairment: Methods: Explain/Verbal Responses: State content correctly [employer]) Signed: 03/31/2022 4:37:26 PM By: Sharyn Creamer RN, BSN Entered By: Sharyn Creamer on 03/30/2022 13:30:52 -------------------------------------------------------------------------------- Wound Assessment Details Patient Name: Date of Service: [name], [name] 03/30/2022 12:30 PM Medical Record Number: [id number] Patient Account Number: [id number] Date of Birth/Sex: Treating RN: Sep 20, 1950 (71 y.o. [name] Primary Care Fern Canova: [name] Other Clinician: Referring Williard Keller: Treating Andera Cranmer/Extender: Milagros Evener in Treatment: 16 Wound Status Wound Number: 1 Primary Etiology: Pressure Ulcer Wound Location: Right Calcaneus Wound Status: Open Wounding Event: Pressure Injury Comorbid History: Cataracts, Hypertension, Cirrhosis , Hepatitis C Date Acquired: 07/02/2020 Weeks Of Treatment: 16 Clustered Wound: No Photos Wound Measurements Length: (cm) 1.9 Width: (cm) 2.2 Depth: (cm) 0.2 Area: (cm) 3.283 Volume: (cm) 0.657 % Reduction in Area: 86.7% % Reduction in Volume: 94.7% Epithelialization: Small (1-33%) Tunneling: No Undermining: Yes Starting Position (o'clock): 12 Ending Position (o'clock): 7 Maximum Distance: (cm) 1.2 Wound Description Classification: Category/Stage IV Wound Margin: Well defined, not attached Exudate Amount: Medium Exudate Type: Serosanguineous Exudate Color: red, brown Foul Odor After Cleansing: No Slough/Fibrino Yes Wound Bed Granulation Amount: Large (67-100%) Exposed Structure Granulation Quality: Red, Pink Fascia Exposed: No Necrotic Amount: None Present (0%) Fat  Layer (Subcutaneous Tissue) Exposed: Yes Tendon Exposed: No Muscle Exposed: No Joint Exposed: No Bone Exposed: No Treatment Notes Wound #1 (Calcaneus) Wound Laterality: Right Cleanser Soap and Water Discharge Instruction: May shower and wash wound with dial antibacterial soap and water prior to dressing change. Wound Cleanser Discharge Instruction: Cleanse the wound with wound cleanser prior to applying a clean dressing using gauze sponges, not tissue or cotton balls. Peri-Wound Care wet to dry Discharge Instruction: in clinic;wound vac with HH Topical Keystone antibiotic compound Discharge Instruction: Apply directly to wound bed use until finished Primary Dressing KerraCel Ag Gelling Fiber Dressing, 4x5 in (silver alginate) Discharge Instruction: Apply silver alginate to wound bed as instructed Plain packing strip 1/4 (in) Discharge Instruction: Lightly pack as instructed Secondary Dressing ALLEVYN Heel 4 1/2in x 5 1/2in / 10.5cm x 13.5cm Discharge Instruction: Apply over primary dressing as directed. Woven Gauze Sponge, Non-Sterile 4x4 in Discharge Instruction: Apply over primary dressing as directed. Secured With Elastic Bandage 4 inch (ACE bandage) Discharge Instruction: Secure with ACE bandage as directed. Kerlix Roll Sterile, 4.5x3.1 (in/yd) Discharge Instruction: Secure with Kerlix as directed. 57M Medipore Soft Cloth Surgical T 2x10 (in/yd) ape Discharge Instruction: Secure with tape as directed. Compression Wrap Compression Stockings Add-Ons foot cover Discharge Instruction: use foot covers Electronic Signature(s) Signed: 03/31/2022 4:37:26 PM By: Sharyn Creamer RN, BSN Entered By: Sharyn Creamer on 03/30/2022 12:54:21 -------------------------------------------------------------------------------- Marcellus Details Patient Name: Date of Service: [name], [name] 03/30/2022 12:30 PM Medical Record Number: [id number] Patient Account Number: [id number] Date  of Birth/Sex: Treating RN: [birth year] (71 y.o. [name] Primary Care Tawnia Schirm: [name] Other Clinician: Referring Benyamin Jeff: Treating Oluwaseun Cremer/Extender: Milagros Evener in Treatment: 16 Vital Signs Time Taken: 12:43 Temperature (F): 97.6 Pulse (bpm): 55 Respiratory Rate (breaths/min): 18 Blood Pressure (mmHg): 136/81 Reference Range: 80 - 120 mg / dl Electronic Signature(s) Signed: 03/31/2022 4:37:26 PM By: Sharyn Creamer RN, BSN Entered By: Sharyn Creamer on 03/30/2022  12:43:33

## 2022-03-31 NOTE — Progress Notes (Signed)
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 (in/yd) (Generic) 3 x Per Week/30 Days ape Discharge Instructions: Secure with tape as directed. Add-Ons: foot cover (Generic) 3 x Per Week/30 Days Discharge Instructions: use foot covers 03/30/2022: The undermined portion of the wound has contracted to the point that it is no longer feasible to get a wound VAC sponge into the space. The surface of the wound has just a light layer of slough and there is some nonviable subcutaneous tissue appreciated at the posterior margin of the wound. No malodor or purulent drainage. I used a curette to debride slough and nonviable subcutaneous tissue from the patient's wound. At this point, I think we can discontinue the wound VAC. We will continue to use his Keystone topical antibiotic compound but will pack the undermined area with Nu Gauze packing strips and apply silver alginate over the open heel portion, covered with a foam heel cup. He should continue to wear his heel  offloading shoe. Follow-up in 2 weeks. Electronic Signature(s) Signed: 03/30/2022 1:31:10 PM By: [name] MD FACS Entered By: [name] on 03/30/2022 13:31:10 -------------------------------------------------------------------------------- HxROS Details Patient Name: Date of Service: [name], [name] 03/30/2022 12:30 PM Medical Record Number: [id number] Patient Account Number: [id number] Date of Birth/Sex: Treating RN: 05-15-1951 (71 y.o. [name] Primary Care Provider: [name] Other Clinician: Referring Provider: Treating Provider/Extender: Milagros Evener in Treatment: 16 Information Obtained From Patient Eyes Medical History: Positive for: Cataracts Hematologic/Lymphatic Medical History: Past Medical History Notes: hyperlipidemia Cardiovascular Medical History: Positive for: Hypertension Gastrointestinal Medical History: Positive for: Cirrhosis ; Hepatitis C - says it is gone Past Medical History Notes: colon polyps Genitourinary Medical History: Past Medical History Notes: BPH Musculoskeletal Medical History: Past Medical History Notes: arthritis Neurologic Medical History: Past Medical History Notes: stroke HBO Extended History Items Eyes: Cataracts Immunizations Pneumococcal Vaccine: Received Pneumococcal Vaccination: Yes Received Pneumococcal Vaccination On or After 60th Birthday: Yes Implantable Devices None Hospitalization / Surgery History Type of Hospitalization/Surgery inguinal hernia repair knee surgery x4 Family and Social History Cancer: Yes - Mother,Father; Diabetes: Yes - Siblings; Heart Disease: Yes - Maternal Grandparents; Hereditary Spherocytosis: No; Hypertension: Yes - Mother; Kidney Disease: No; Lung Disease: Yes - Mother; Seizures: No; Stroke: Yes - Mother; Thyroid Problems: Yes - Siblings; Tuberculosis: No; Former smoker - ended on 09/25/1982; Marital Status - Single;  Alcohol Use: Never; Drug Use: Prior History - cocaine, heroin, marijuana; Caffeine Use: Never; Financial Concerns: No; Food, Clothing or Shelter Needs: No; Support System Lacking: Yes; Transportation Concerns: No Electronic Signature(s) Signed: 03/30/2022 1:41:21 PM By: [name] MD FACS Signed: 03/31/2022 5:36:41 PM By: Dellie Catholic RN Entered By: [name] on 03/30/2022 13:21:08 -------------------------------------------------------------------------------- SuperBill Details Patient Name: Date of Service: [name] N 03/30/2022 Medical Record Number: [id number] Patient Account Number: [id number] Date of Birth/Sex: Treating RN: 09/11/1950 (71 y.o. 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), and cerebral infarction without residual deficits K74.60 Unspecified cirrhosis of liver B18.2 Chronic viral hepatitis C I63.9 Cerebral infarction, unspecified I10 Essential (primary) hypertension Facility Procedures CPT4 Code: [id number] Description: 31497 - DEB SUBQ TISSUE 20 SQ CM/< ICD-10 Diagnosis Description L89.610 Pressure ulcer of right heel, unstageable Modifier: Quantity: 1 Physician Procedures : CPT4 Code Description Modifier [id number] - WC PHYS LEVEL 4 - EST PT 25 ICD-10 Diagnosis Description L89.610 Pressure ulcer of right heel, unstageable K74.60 Unspecified cirrhosis of liver I10 Essential (primary) hypertension I63.9 Cerebral  infarction, unspecified Quantity: 1 : [id number] 86767 - WC PHYS SUBQ TISS 20 SQ CM ICD-10 Diagnosis Description L89.610 Pressure ulcer of right heel, unstageable Quantity: 1 Electronic Signature(s) Signed: 03/30/2022 1:31:30 PM By: [name] MD FACS Entered By: [name] on 03/30/2022 13:31:30

## 2022-04-13 ENCOUNTER — Encounter (HOSPITAL_BASED_OUTPATIENT_CLINIC_OR_DEPARTMENT_OTHER): Payer: 59 | Attending: General Surgery | Admitting: General Surgery

## 2022-04-13 DIAGNOSIS — Z8616 Personal history of COVID-19: Secondary | ICD-10-CM | POA: Insufficient documentation

## 2022-04-13 DIAGNOSIS — I10 Essential (primary) hypertension: Secondary | ICD-10-CM | POA: Diagnosis not present

## 2022-04-13 DIAGNOSIS — I6930 Unspecified sequelae of cerebral infarction: Secondary | ICD-10-CM | POA: Insufficient documentation

## 2022-04-13 DIAGNOSIS — K7460 Unspecified cirrhosis of liver: Secondary | ICD-10-CM | POA: Diagnosis not present

## 2022-04-13 DIAGNOSIS — L89610 Pressure ulcer of right heel, unstageable: Secondary | ICD-10-CM | POA: Diagnosis not present

## 2022-04-13 DIAGNOSIS — L97512 Non-pressure chronic ulcer of other part of right foot with fat layer exposed: Secondary | ICD-10-CM | POA: Diagnosis not present

## 2022-04-13 DIAGNOSIS — B182 Chronic viral hepatitis C: Secondary | ICD-10-CM | POA: Diagnosis not present

## 2022-04-13 NOTE — Progress Notes (Signed)
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 There was a Excisional Skin/Subcutaneous Tissue Debridement with a total area of 5.32 sq cm performed by [name], MD. With the following instrument(s): Curette to remove Non-Viable tissue/material. Material removed includes Subcutaneous Tissue after achieving pain control using Lidocaine 5% topical ointment. No specimens were taken. A time out was conducted at 13:15, prior to the start of the procedure. A Minimum amount of bleeding was controlled with Pressure. The procedure was tolerated well with a pain level of 0 throughout and a pain level of 0 following the procedure. Post Debridement Measurements: 1.9cm length x  2.8cm width x 0.2cm depth; 0.836cm^3 volume. Post debridement Stage noted as Category/Stage IV. Character of Wound/Ulcer Post Debridement is improved. Post procedure Diagnosis Wound #1: Same as Pre-Procedure General Notes: scribed for Dr Celine Ahr by Luisa Dago. Wound #2 Pre-procedure diagnosis of Wound #2 is a T be determined located on the Right,Plantar T Great . An Chemical Cauterization procedure was performed by o Michaelle Copas, MD. Post procedure Diagnosis Wound #2: Same as Pre-Procedure Notes: silver nitrate to wound bed to treat hypergranulation scribed for Dr Celine Ahr by Luisa Dago Plan 04/13/2022: There is some slough accumulation on the wound, but no malodor or purulent drainage. His right great toe nail fell off and he has an open area with hypertrophic granulation tissue present. No concern for infection. I used a curette to debride slough, periwound callus, and nonviable subcutaneous tissue from his wound. We will reiterate to home health that the iodoform packing strips need to be placed into the undermined portion of his wound with silver alginate on the heel surface. I use silver nitrate to chemically cauterize the hypertrophic granulation tissue on his great toe. We will use silver alginate in this location as well. Follow-up in 2 weeks. Electronic Signature(s) Signed: 04/13/2022 1:24:21 PM By: [name] MD FACS Entered By: [name] on 04/13/2022 13:24:21 -------------------------------------------------------------------------------- HxROS Details Patient Name: Date of Service: [name], [name] 04/13/2022 12:30 PM Medical Record Number: [id number] Patient Account Number: [id number] Date of Birth/Sex: Treating RN: 02-20-1951 (71 y.o. M) Primary Care Provider: [name] Other Clinician: Referring Provider: Treating Provider/Extender: Milagros Evener in Treatment: 18 Information Obtained  From Patient Eyes Medical History: Positive for: Cataracts Hematologic/Lymphatic Medical History: Past Medical History Notes: hyperlipidemia Cardiovascular Medical History: Positive for: Hypertension Gastrointestinal Medical History: Positive for: Cirrhosis ; Hepatitis C - says it is gone Past Medical History Notes: colon polyps Genitourinary Medical History: Past Medical History Notes: BPH Musculoskeletal Medical History: Past Medical History Notes: arthritis Neurologic Medical History: Past Medical History Notes: stroke HBO Extended History Items Eyes: Cataracts  Immunizations Pneumococcal Vaccine: Received Pneumococcal Vaccination: Yes Received Pneumococcal Vaccination On or After 60th Birthday: Yes Implantable Devices None Hospitalization / Surgery History Type of Hospitalization/Surgery inguinal hernia repair knee surgery x4 Family and Social History Cancer: Yes - Mother,Father; Diabetes: Yes - Siblings; Heart Disease: Yes - Maternal Grandparents; Hereditary Spherocytosis: No; Hypertension: Yes - Mother; Kidney Disease: No; Lung Disease: Yes - Mother; Seizures: No; Stroke: Yes - Mother; Thyroid Problems: Yes - Siblings; Tuberculosis: No; Former smoker - ended on 09/25/1982; Marital Status - Single; Alcohol Use: Never; Drug Use: Prior History - cocaine, heroin, marijuana; Caffeine Use: Never; Financial Concerns: No; Food, Clothing or Shelter Needs: No; Support System Lacking: Yes; Transportation Concerns: No Electronic Signature(s) Signed: 04/13/2022 2:32:18 PM By: [name] MD FACS Entered By: [name] on 04/13/2022 13:22:16 -------------------------------------------------------------------------------- SuperBill Details Patient Name: Date of Service: [name], [name] 04/13/2022 Medical Record Number: [id number] Patient Account Number: [id number] Date of Birth/Sex: Treating RN: [birth year] (70 y.o. M) Primary Care Provider: [name] Other Clinician: Referring Provider: Treating Provider/Extender: Milagros Evener in Treatment: 18 Diagnosis Coding ICD-10 Codes Code Description L89.610 Pressure ulcer of right heel, unstageable Z86.73 Personal history of transient ischemic attack (TIA), and cerebral infarction without residual deficits K74.60 Unspecified cirrhosis of liver B18.2 Chronic viral hepatitis C I63.9 Cerebral infarction, unspecified I10 Essential (primary) hypertension L97.512 Non-pressure chronic ulcer of other part of right foot with fat layer exposed Facility Procedures CPT4 Code: [id number] Description: 34196 - DEB SUBQ TISSUE 20 SQ CM/< ICD-10 Diagnosis Description L89.610 Pressure ulcer of right heel, unstageable Modifier: Quantity: 1 CPT4 Code: [id number] Description: 21194 - CHEM CAUT GRANULATION TISS ICD-10 Diagnosis Description L97.512 Non-pressure chronic ulcer of other part of right foot with fat layer exposed Modifier: Quantity: 1 Physician Procedures : CPT4 Code Description Modifier [id number] 48185 - WC PHYS LEVEL 3 - EST PT 25 ICD-10 Diagnosis Description L89.610 Pressure ulcer of right heel, unstageable L97.512 Non-pressure chronic ulcer of other part of right foot with fat layer exposed K74.60  Unspecified cirrhosis of liver I10 Essential (primary) hypertension Quantity: 1 : [id number] 26378 - WC PHYS SUBQ TISS 20 SQ CM ICD-10 Diagnosis Description L89.610 Pressure ulcer of right heel, unstageable Quantity: 1 : [id number] 74128 - WC PHYS CHEM CAUT GRAN TISSUE ICD-10 Diagnosis Description L97.512 Non-pressure chronic ulcer of other part of right foot with fat layer exposed Quantity: 1 Electronic Signature(s) Signed: 04/13/2022 1:24:48 PM By: [name] MD FACS Entered By: [name] on 04/13/2022 13:24:48

## 2022-04-13 NOTE — Progress Notes (Signed)
KEMPER, HEUPEL ([id number]) Visit Report for 04/13/2022 Arrival Information Details Patient Name: Date of Service: [name] [name] 04/13/2022 12:30 PM Medical Record Number: [id number] Patient Account Number: [id number] Date of Birth/Sex: Treating RN: [name] (71 y.o. [name] Primary Care [name]: [name] Other Clinician: Referring [name]: Treating [name]/Extender: [name] in Treatment: 18 Visit Information History Since Last Visit Added or deleted any medications: No Patient Arrived: Wheel Chair Any new allergies or adverse reactions: No Arrival Time: 12:49 Had a fall or experienced change in No Accompanied By: sister activities of daily living that may affect Transfer Assistance: None risk of falls: Patient Identification Verified: Yes Signs or symptoms of abuse/neglect since last visito No Secondary Verification Process Completed: Yes Hospitalized since last visit: No Patient Requires Transmission-Based Precautions: No Implantable device outside of the clinic excluding No Patient Has Alerts: Yes cellular tissue based products placed in the center Patient Alerts: Patient on Blood Thinner since last visit: Has Dressing in Place as Prescribed: Yes Pain Present Now: No Electronic Signature(s) Signed: 04/13/2022 4:36:25 PM By: Sharyn Creamer RN, BSN Entered By: Sharyn Creamer on 04/13/2022 12:50:51 -------------------------------------------------------------------------------- Encounter Discharge Information Details Patient Name: Date of Service: [name], [name] 04/13/2022 12:30 PM Medical Record Number: [id number] Patient Account Number: [id number] Date of Birth/Sex: Treating RN: [birth year] (71 y.o. [name] Primary Care Meliton [name]: [name] Other Clinician: Referring Shevaun Lovan: Treating Aryannah Mohon/Extender: [name] in Treatment: 18 Encounter Discharge  Information Items Post Procedure Vitals Discharge Condition: Stable Temperature (F): 97.7 Ambulatory Status: Wheelchair Pulse (bpm): 61 Discharge Destination: Home Respiratory Rate (breaths/min): 18 Transportation: Private Auto Blood Pressure (mmHg): 132/81 Accompanied By: sister Schedule Follow-up Appointment: Yes Clinical Summary of Care: Patient Declined Electronic Signature(s) Signed: 04/13/2022 4:46:51 PM By: Sharyn Creamer RN, BSN Entered By: Sharyn Creamer on 04/13/2022 16:45:17 -------------------------------------------------------------------------------- Lower Extremity Assessment Details Patient Name: Date of Service: [name], [place] [name] 04/13/2022 12:30 PM Medical Record Number: [id number] Patient Account Number: [id number] Date of Birth/Sex: Treating RN: [birth year] [name] (71 y.o. [name] Primary Care Shaketta Rill: [name] Other Clinician: Referring [name]: Treating [name]/Extender: [name] in Treatment: 18 [name]: No] [name]: No] Edema: [Left: [name]] [Right: o] Calf Left: Right: Point of Measurement: From Medial Instep 32 cm Ankle Left: Right: Point of Measurement: From Medial Instep 22 cm Vascular Assessment Pulses: Dorsalis Pedis Palpable: [Right:Yes] Electronic Signature(s) Signed: 04/13/2022 4:36:25 PM By: Sharyn Creamer RN, BSN Entered By: Sharyn Creamer on 04/13/2022 90:24:09 -------------------------------------------------------------------------------- Multi Wound Chart Details Patient Name: Date of Service: [name], [name] 04/13/2022 12:30 PM Medical Record Number: [id number] Patient Account Number: [id number] Date of Birth/Sex: Treating RN: [birth year] (71 y.o. M) Primary Care Khadeejah Castner: [name] Other Clinician: Referring [name]: Treating [name]/Extender: [name] in Treatment: 18 Vital Signs Height(in): Pulse(bpm):  70 Weight(lbs): Blood Pressure(mmHg): 132/81 Body Mass Index(BMI): Temperature(F): 97.7 Respiratory Rate(breaths/min): 18 Photos: [1:Right Calcaneus] [2:Right, Plantar T Great oe] [[name]/A:[name]/A [name]/A] Wound Location: [1:Pressure Injury] [2:Gradually Appeared] [[name]/A:[name]/A] Wounding Event: [1:Pressure Ulcer] [2:T be determined o] [[name]/A:[name]/A] Primary Etiology: [1:Cataracts, Hypertension, Cirrhosis ,] [2:Cataracts, Hypertension, Cirrhosis ,] [[name]/A:[name]/A] Comorbid History: [1:Hepatitis C 07/02/2020] [2:Hepatitis C 04/13/2022] [[name]/A:[name]/A] Date Acquired: [1:18] [2:0] [[name]/A:[name]/A] Weeks of Treatment: [1:Open] [2:Open] [[name]/A:[name]/A] Wound Status: [1:No] [2:No] [[name]/A:[name]/A] Wound Recurrence: [1:1.9x2.8x0.2] [2:0.5x0.5x0.1] [[name]/A:[name]/A] Measurements L x W x D (cm) [1:4.178] [2:0.196] [[name]/A:[name]/A] A (cm) : rea [1:0.836] [2:0.02] [[name]/A:[name]/A] Volume (cm) : [  1:83.10%] [2:[name]/A] [[name]/A:[name]/A] % Reduction in A rea: [1:93.20%] [2:[name]/A] [[name]/A:[name]/A] % Reduction in Volume: [1:12] Starting Position 1 (o'clock): [1:7] Ending Position 1 (o'clock): [1:1.2] Maximum Distance 1 (cm): [1:Yes] [2:No] [[name]/A:[name]/A] Undermining: [1:Category/Stage IV] [2:Full Thickness Without Exposed] [[name]/A:[name]/A] Classification: [1:Medium] [2:Support Structures Medium] [[name]/A:[name]/A] Exudate A mount: [1:Serosanguineous] [2:Serosanguineous] [[name]/A:[name]/A] Exudate Type: [1:red, brown] [2:red, brown] [[name]/A:[name]/A] Exudate Color: [1:Well defined, not attached] [2:[name]/A] [[name]/A:[name]/A] Wound Margin: [1:Large (67-100%)] [2:Large (67-100%)] [[name]/A:[name]/A] Granulation A mount: [1:Red, Pink] [2:Red] [[name]/A:[name]/A] Granulation Quality: [1:Small (1-33%)] [2:Small (1-33%)] [[name]/A:[name]/A] Necrotic A mount: [1:Fat Layer (Subcutaneous Tissue): Yes Fat Layer (Subcutaneous Tissue): Yes [name]/A] Exposed Structures: [1:Fascia: No Tendon: No Muscle: No Joint: No Bone: No Small (1-33%)] [2:Fascia: No Tendon: No Muscle: No Joint: No Bone: No None] [[name]/A:[name]/A] Epithelialization: [1:Debridement - Excisional] [2:[name]/A]  [[name]/A:[name]/A] Debridement: Pre-procedure Verification/Time Out 13:15 [2:[name]/A] [[name]/A:[name]/A] Taken: [1:Lidocaine 5% topical ointment] [2:[name]/A] [[name]/A:[name]/A] Pain Control: [1:Subcutaneous] [2:[name]/A] [[name]/A:[name]/A] Tissue Debrided: [1:Skin/Subcutaneous Tissue] [2:[name]/A] [[name]/A:[name]/A] Level: [1:5.32] [2:[name]/A] [[name]/A:[name]/A] Debridement A (sq cm): [1:rea Curette] [2:[name]/A] [[name]/A:[name]/A] Instrument: [1:Minimum] [2:[name]/A] [[name]/A:[name]/A] Bleeding: [1:Pressure] [2:[name]/A] [[name]/A:[name]/A] Hemostasis A chieved: [1:0] [2:[name]/A] [[name]/A:[name]/A] Procedural Pain: [1:0] [2:[name]/A] [[name]/A:[name]/A] Post Procedural Pain: [1:Procedure was tolerated well] [2:[name]/A] [[name]/A:[name]/A] Debridement Treatment Response: [1:1.9x2.8x0.2] [2:[name]/A] [[name]/A:[name]/A] Post Debridement Measurements L x W x D (cm) [1:0.836] [2:[name]/A] [[name]/A:[name]/A] Post Debridement Volume: (cm) [1:Category/Stage IV] [2:[name]/A] [[name]/A:[name]/A] Post Debridement Stage: [1:Debridement] [2:Chemical Cauterization] [[name]/A:[name]/A] Treatment Notes Electronic Signature(s) Signed: 04/13/2022 1:20:51 PM By: [name] MD FACS Entered By: [name] on 04/13/2022 13:20:51 -------------------------------------------------------------------------------- Multi-Disciplinary Care Plan Details Patient Name: Date of Service: [name], [name] 04/13/2022 12:30 PM Medical Record Number: [id number] Patient Account Number: [id number] Date of Birth/Sex: Treating RN: [birth year] [name] (71 y.o. [name] Primary Care Emmamae Mcnamara: [name] Other Clinician: Referring Marylon Verno: Treating Maynard David/Extender: [name] in Treatment: 67 Active Inactive Abuse / Safety / Falls / Self Care Management Nursing Diagnoses: History of Falls Impaired physical mobility Potential for falls Potential for injury related to falls Goals: Patient will remain injury free related to falls Date Initiated: 12/08/2021 Date Inactivated: 01/13/2022 Target Resolution Date: 01/05/2022 Goal Status: Met Patient/caregiver will verbalize  understanding of skin care regimen Date Initiated: 12/08/2021 Target Resolution Date: 07/01/2022 Goal Status: Active Interventions: Assess fall risk on admission and as needed Assess: immobility, friction, shearing, incontinence upon admission and as needed Assess impairment of mobility on admission and as needed per policy Provide education on fall prevention Notes: Wound/Skin Impairment Nursing Diagnoses: Impaired tissue integrity Knowledge deficit related to ulceration/compromised skin integrity Goals: Patient/caregiver will verbalize understanding of skin care regimen Date Initiated: 12/08/2021 Target Resolution Date: 07/01/2022 Goal Status: Active Ulcer/skin breakdown will have a volume reduction of 30% by week 4 Date Initiated: 12/08/2021 Date Inactivated: 01/13/2022 Target Resolution Date: 01/05/2022 Goal Status: Met Interventions: Assess patient/caregiver ability to perform ulcer/skin care regimen upon admission and as needed Assess ulceration(s) every visit Provide education on ulcer and skin care Treatment Activities: Skin care regimen initiated : 12/08/2021 Topical wound management initiated : 12/08/2021 Notes: Electronic Signature(s) Signed: 04/13/2022 4:36:25 PM By: Sharyn Creamer RN, BSN Entered By: Sharyn Creamer on 04/13/2022 13:09:40 -------------------------------------------------------------------------------- Pain Assessment Details Patient Name: Date of Service: [name], [name] 04/13/2022 12:30 PM Medical Record Number: [id number] Patient Account Number: [id number] Date of Birth/Sex: Treating RN: [birth year][name] (71 y.o. [name] Primary Care Quention Mcneill: [name] Other Clinician: Referring Willard Madrigal: Treating Ahnika Hannibal/Extender: [name] in Treatment: 18 Active Problems Location of Pain Severity and Description of Pain  Patient Has Paino No Site Locations Pain Management and Medication Current Pain  Management: Electronic Signature(s) Signed: 04/13/2022 4:36:25 PM By: Sharyn Creamer RN, BSN Entered By: Sharyn Creamer on 04/13/2022 12:52:43 -------------------------------------------------------------------------------- Patient/Caregiver Education Details Patient Name: Date of Service: [name] 9/12/2023andnbsp12:30 PM Medical Record Number: [id number] Patient Account Number: [id number] Date of Birth/Gender: Treating RN: [name] (71 y.o. [name] Primary Care Physician: [name] Other Clinician: Referring Physician: Treating Physician/Extender: [name] in Treatment: 18 Education Assessment Education Provided To: Patient Education Topics Provided Wound/Skin Impairment: Methods: Explain/Verbal Responses: State content correctly [employer]) Signed: 04/13/2022 4:36:25 PM By: Sharyn Creamer RN, BSN Entered By: Sharyn Creamer on 04/13/2022 13:09:54 -------------------------------------------------------------------------------- Wound Assessment Details Patient Name: Date of Service: [name], [name] 04/13/2022 12:30 PM Medical Record Number: [id number] Patient Account Number: [id number] Date of Birth/Sex: Treating RN: [birth year][name] (71 y.o. [name] Primary Care Keyante Durio: [name] Other Clinician: Referring Saw Mendenhall: Treating Wende Longstreth/Extender: [name] in Treatment: 18 Wound Status Wound Number: 1 Primary Etiology: Pressure Ulcer Wound Location: Right Calcaneus Wound Status: Open Wounding Event: Pressure Injury Comorbid History: Cataracts, Hypertension, Cirrhosis , Hepatitis C Date Acquired: 07/02/2020 Weeks Of Treatment: 18 Clustered Wound: No Photos Wound Measurements Length: (cm) 1.9 Width: (cm) 2.8 Depth: (cm) 0.2 Area: (cm) 4.178 Volume: (cm) 0.836 % Reduction in Area: 83.1% % Reduction in Volume: 93.2% Epithelialization: Small  (1-33%) Tunneling: No Undermining: Yes Starting Position (o'clock): 12 Ending Position (o'clock): 7 Maximum Distance: (cm) 1.2 Wound Description Classification: Category/Stage IV Wound Margin: Well defined, not attached Exudate Amount: Medium Exudate Type: Serosanguineous Exudate Color: red, brown Foul Odor After Cleansing: No Slough/Fibrino Yes Wound Bed Granulation Amount: Large (67-100%) Exposed Structure Granulation Quality: Red, Pink Fascia Exposed: No Necrotic Amount: Small (1-33%) Fat Layer (Subcutaneous Tissue) Exposed: Yes Necrotic Quality: Adherent Slough Tendon Exposed: No Muscle Exposed: No Joint Exposed: No Bone Exposed: No Treatment Notes Wound #1 (Calcaneus) Wound Laterality: Right Cleanser Soap and Water Discharge Instruction: May shower and wash wound with dial antibacterial soap and water prior to dressing change. Wound Cleanser Discharge Instruction: Cleanse the wound with wound cleanser prior to applying a clean dressing using gauze sponges, not tissue or cotton balls. Peri-Wound Care Topical Primary Dressing KerraCel Ag Gelling Fiber Dressing, 4x5 in (silver alginate) Discharge Instruction: Apply silver alginate to wound bed as instructed Plain packing strip 1/4 (in) Discharge Instruction: Lightly pack as instructed Iodoform packing strip 1/4 (in) Discharge Instruction: Lightly pack as instructed Secondary Dressing ALLEVYN Heel 4 1/2in x 5 1/2in / 10.5cm x 13.5cm Discharge Instruction: Apply over primary dressing as directed. Woven Gauze Sponge, Non-Sterile 4x4 in Discharge Instruction: Apply over primary dressing as directed. Secured With Elastic Bandage 4 inch (ACE bandage) Discharge Instruction: Secure with ACE bandage as directed. Kerlix Roll Sterile, 4.5x3.1 (in/yd) Discharge Instruction: Secure with Kerlix as directed. 45M Medipore Soft Cloth Surgical T 2x10 (in/yd) ape Discharge Instruction: Secure with tape as directed. Compression  Wrap Compression Stockings Add-Ons foot cover Discharge Instruction: use foot covers Electronic Signature(s) Signed: 04/13/2022 4:36:25 PM By: Sharyn Creamer RN, BSN Entered By: Sharyn Creamer on 04/13/2022 13:02:44 -------------------------------------------------------------------------------- Wound Assessment Details Patient Name: Date of Service: [name], [name] 04/13/2022 12:30 PM Medical Record Number: [id number] Patient Account Number: [id number] Date of Birth/Sex: Treating RN: [name][birth year] (71 y.o. [name] Primary Care Atari Novick: [name] Other Clinician: Referring Zaleah Ternes: Treating Zevin Nevares/Extender: [name] in Treatment:  18 Wound Status Wound Number: 2 Primary Etiology: T be determined o Wound Location: Right, Plantar T Great oe Wound Status: Open Wounding Event: Gradually Appeared Comorbid History: Cataracts, Hypertension, Cirrhosis , Hepatitis C Date Acquired: 04/13/2022 Weeks Of Treatment: 0 Clustered Wound: No Photos Wound Measurements Length: (cm) 0.5 Width: (cm) 0.5 Depth: (cm) 0.1 Area: (cm) 0.196 Volume: (cm) 0.02 % Reduction in Area: % Reduction in Volume: Epithelialization: None Tunneling: No Undermining: No Wound Description Classification: Full Thickness Without Exposed Support Structures Exudate Amount: Medium Exudate Type: Serosanguineous Exudate Color: red, brown Foul Odor After Cleansing: No Slough/Fibrino Yes Wound Bed Granulation Amount: Large (67-100%) Exposed Structure Granulation Quality: Red Fascia Exposed: No Necrotic Amount: Small (1-33%) Fat Layer (Subcutaneous Tissue) Exposed: Yes Necrotic Quality: Adherent Slough Tendon Exposed: No Muscle Exposed: No Joint Exposed: No Bone Exposed: No Treatment Notes Wound #2 (Toe Great) Wound Laterality: Plantar, Right Cleanser Soap and Water Discharge Instruction: May shower and wash wound with dial antibacterial soap and water  prior to dressing change. Wound Cleanser Discharge Instruction: Cleanse the wound with wound cleanser prior to applying a clean dressing using gauze sponges, not tissue or cotton balls. Peri-Wound Care Topical Primary Dressing KerraCel Ag Gelling Fiber Dressing, 4x5 in (silver alginate) Discharge Instruction: Apply silver alginate to wound bed as instructed Iodoform packing strip 1/4 (in) Discharge Instruction: Lightly pack as instructed Secondary Dressing Woven Gauze Sponge, Non-Sterile 4x4 in Discharge Instruction: Apply over primary dressing as directed. Secured With [employer] Surgical T 2x10 (in/yd) ape Discharge Instruction: Secure with tape as directed. Compression Wrap Compression Stockings Add-Ons foot cover Discharge Instruction: use foot covers Electronic Signature(s) Signed: 04/13/2022 4:36:25 PM By: Sharyn Creamer RN, BSN Entered By: Sharyn Creamer on 04/13/2022 13:06:06 -------------------------------------------------------------------------------- Vitals Details Patient Name: Date of Service: [name], [name] 04/13/2022 12:30 PM Medical Record Number: [id number] Patient Account Number: [id number] Date of Birth/Sex: Treating RN: [name] (71 y.o. [name] Primary Care Manases Etchison: [name] Other Clinician: Referring Darthula Desa: Treating Garan Frappier/Extender: [name] in Treatment: 18 Vital Signs Time Taken: 12:50 Temperature (F): 97.7 Pulse (bpm): 61 Respiratory Rate (breaths/min): 18 Blood Pressure (mmHg): 132/81 Reference Range: 80 - 120 mg / dl Electronic Signature(s) Signed: 04/13/2022 4:36:25 PM By: Sharyn Creamer RN, BSN Entered By: Sharyn Creamer on 04/13/2022 12:52:37

## 2022-04-27 ENCOUNTER — Encounter (HOSPITAL_BASED_OUTPATIENT_CLINIC_OR_DEPARTMENT_OTHER): Payer: 59 | Admitting: General Surgery

## 2022-04-27 DIAGNOSIS — L89610 Pressure ulcer of right heel, unstageable: Secondary | ICD-10-CM | POA: Diagnosis not present

## 2022-04-27 NOTE — Progress Notes (Signed)
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 With the following instrument(s): Curette to remove Non-Viable tissue/material. Material removed includes Callus, Subcutaneous Tissue, and Slough after achieving pain control using Lidocaine 4% T opical Solution. No specimens were taken. A time out was conducted at 13:01, prior to the start of the procedure. A Minimum amount of bleeding was controlled with Pressure. The procedure was tolerated well with a pain level of 0 throughout and a pain level of 0 following the procedure. Post Debridement Measurements: 1.8cm length x 2.8cm width x 0.3cm depth; 1.188cm^3 volume. Post debridement Stage noted as Category/Stage IV. Character of Wound/Ulcer Post Debridement is improved. Post procedure Diagnosis Wound #1: Same as Pre-Procedure General Notes: Scribed for Dr. Celine Ahr by J.Scotton. Plan Follow-up Appointments: Return Appointment in 2 weeks. - Dr. Celine Ahr Room 3 Tuesday 05/11/22 at  12:30pm Anesthetic: Wound #1 Right Calcaneus: (In clinic) Topical Lidocaine 5% applied to wound bed Bathing/ Shower/ Hygiene: May shower and wash wound with soap and water. - with dressing changes Home Health: New wound care orders this week; continue Home Health for wound care. May utilize formulary equivalent dressing for wound treatment orders unless otherwise specified. - iodoform Packing strips to undermining area of wound, cover with silver alginate, gauze, heel cup and kerlix silver alginate and gauze to toe Dressing changes to be completed by [facility] on Monday / Wednesday / Friday except when patient has scheduled visit at [facility]. Other Home Health Orders/Instructions: - [facility] 3x a week WOUND #1: - Calcaneus Wound Laterality: Right Cleanser: Soap and Water 3 x Per Week/30 Days Discharge Instructions: May shower and wash wound with dial antibacterial soap and water prior to dressing change. Cleanser: Wound Cleanser 3 x Per Week/30 Days Discharge Instructions: Cleanse the wound with wound cleanser prior to applying a clean dressing using gauze sponges, not tissue or cotton balls. Prim Dressing: KerraCel Ag Gelling Fiber Dressing, 4x5 in (silver alginate) 3 x Per Week/30 Days ary Discharge Instructions: Apply silver alginate to wound bed as instructed Prim Dressing: Iodoform packing strip 1/4 (in) 3 x Per Week/30 Days ary Discharge Instructions: Lightly pack as instructed Secondary Dressing: ALLEVYN Heel 4 1/2in x 5 1/2in / 10.5cm x 13.5cm (Generic) 3 x Per Week/30 Days Discharge Instructions: Apply over primary dressing as directed. Secondary Dressing: Woven Gauze Sponge, Non-Sterile 4x4 in 3 x Per Week/30 Days Discharge Instructions: Apply over primary dressing as directed. Secured With: Elastic Bandage 4 inch (ACE bandage) (Generic) 3 x Per Week/30 Days Discharge Instructions: Secure with ACE bandage as directed. Secured With: [employer], 4.5x3.1  (in/yd) (Generic) 3 x Per Week/30 Days Discharge Instructions: Secure with Kerlix as directed. Secured With: 64M Medipore Public affairs consultant Surgical T 2x10 (in/yd) (Generic) 3 x Per Week/30 Days ape Discharge Instructions: Secure with tape as directed. Add-Ons: foot cover (Generic) 3 x Per Week/30 Days Discharge Instructions: use foot covers WOUND #2: -  T Great Wound Laterality: Plantar, Right oe Cleanser: Soap and Water 3 x Per Week/30 Days Discharge Instructions: May shower and wash wound with dial antibacterial soap and water prior to dressing change. Cleanser: Wound Cleanser 3 x Per Week/30 Days Discharge Instructions: Cleanse the wound with wound cleanser prior to applying a clean dressing using gauze sponges, not tissue or cotton balls. Prim Dressing: KerraCel Ag Gelling Fiber Dressing, 4x5 in (silver alginate) 3 x Per Week/30 Days ary Discharge Instructions: Apply silver alginate to wound bed as instructed Secondary Dressing: Woven Gauze Sponge, Non-Sterile 4x4 in 3 x Per Week/30 Days Discharge Instructions: Apply over primary dressing as directed. Secured With: 28M Medipore Public affairs consultant Surgical T 2x10 (in/yd) 3 x Per Week/30 Days ape Discharge Instructions: Secure with tape as directed. Add-Ons: foot cover (Generic) 3 x Per Week/30 Days Discharge Instructions: use foot covers 04/27/2022: The wound has contracted considerably. The undermining has less depth and the surface has a nice layer of robust granulation tissue. There is some slough and periwound callus accumulation. The right great toenail site has scabbed over. No concern for infection. I used a curette to debride slough, callus, and nonviable subcutaneous tissue from the heel wound. I am going to let the right great toenail site just dry up, because it is nearly closed. We will continue to use iodoform packing strips into the undermined portion of the heel wound, with silver alginate on the calcaneus surface with a heel cup for  padding. He will follow-up in 2 weeks' time. Electronic Signature(s) Signed: 04/27/2022 1:23:24 PM By: [name] MD FACS Entered By: [name] on 04/27/2022 13:23:24 -------------------------------------------------------------------------------- HxROS Details Patient Name: Date of Service: [name], [name] 04/27/2022 12:30 PM Medical Record Number: [id number] Patient Account Number: [id number] Date of Birth/Sex: Treating RN: Jul 08, 1951 (71 y.o. [name] Primary Care Provider: [name] Other Clinician: Referring Provider: Treating Provider/Extender: Milagros Evener in Treatment: 20 Information Obtained From Patient Eyes Medical History: Positive for: Cataracts Hematologic/Lymphatic Medical History: Past Medical History Notes: hyperlipidemia Cardiovascular Medical History: Positive for: Hypertension Gastrointestinal Medical History: Positive for: Cirrhosis ; Hepatitis C - says it is gone Past Medical History Notes: colon polyps Genitourinary Medical History: Past Medical History Notes: BPH Musculoskeletal Medical History: Past Medical History Notes: arthritis Neurologic Medical History: Past Medical History Notes: stroke HBO Extended History Items Eyes: Cataracts Immunizations Pneumococcal Vaccine: Received Pneumococcal Vaccination: Yes Received Pneumococcal Vaccination On or After 60th Birthday: Yes Implantable Devices None Hospitalization / Surgery History Type of Hospitalization/Surgery inguinal hernia repair knee surgery x4 Family and Social History Cancer: Yes - Mother,Father; Diabetes: Yes - Siblings; Heart Disease: Yes - Maternal Grandparents; Hereditary Spherocytosis: No; Hypertension: Yes - Mother; Kidney Disease: No; Lung Disease: Yes - Mother; Seizures: No; Stroke: Yes - Mother; Thyroid Problems: Yes - Siblings; Tuberculosis: No; Former smoker - ended on 09/25/1982; Marital Status - Single;  Alcohol Use: Never; Drug Use: Prior History - cocaine, heroin, marijuana; Caffeine Use: Never; Financial Concerns: No; Food, Clothing or Shelter Needs: No; Support System Lacking: Yes; Transportation Concerns: No Electronic Signature(s) Signed: 04/27/2022 1:36:36 PM By: [name] MD FACS Signed: 04/27/2022 4:50:18 PM By: Dellie Catholic RN Entered By: [name] on 04/27/2022 13:21:05 -------------------------------------------------------------------------------- SuperBill Details Patient Name: Date of Service: [name], [place] [name] 04/27/2022 Medical Record Number: [id number] Patient Account Number: [id number] Date of Birth/Sex: Treating RN: 19-Feb-1951 (71 y.o. [name] Primary Care Provider: [name] Other Clinician: Referring Provider: Treating Provider/Extender:  [name] [name]: 20 Diagnosis Coding ICD-10 Codes Code Description L89.610 Pressure ulcer of right heel, unstageable Z86.73 Personal history of transient ischemic attack (TIA), and cerebral infarction without residual deficits K74.60 Unspecified cirrhosis of liver B18.2 Chronic viral hepatitis C I63.9 Cerebral infarction, unspecified I10 Essential (primary) hypertension L97.512 Non-pressure chronic ulcer of other part of right foot with fat layer exposed Facility Procedures CPT4 Code: [id number] Description: [facility] - DEB SUBQ TISSUE 20 SQ CM/< ICD-10 Diagnosis Description L89.610 Pressure ulcer of right heel, unstageable Modifier: Quantity: 1 Physician Procedures : CPT4 Code Description Modifier [id number] - WC PHYS LEVEL 3 - EST PT 25 ICD-10 Diagnosis Description L89.610 Pressure ulcer of right heel, unstageable L97.512 Non-pressure chronic ulcer of other part of right foot with fat layer exposed Z86.73  Personal history of transient ischemic attack (TIA), and cerebral infarction without residual deficits I10 Essential (primary) hypertension Quantity:  1 : [id number] - WC PHYS SUBQ TISS 20 SQ CM ICD-10 Diagnosis Description L89.610 Pressure ulcer of right heel, unstageable Quantity: 1 Electronic Signature(s) Signed: 04/27/2022 1:23:50 PM By: [name] MD FACS Entered By: [name] on 04/27/2022 13:23:50

## 2022-04-27 NOTE — Progress Notes (Signed)
[name], [name] ([id number]) Visit Report for 04/27/2022 Arrival Information Details Patient Name: Date of Service: [name] 04/27/2022 12:30 PM Medical Record Number: [id number] Patient Account Number: [id number] Date of Birth/Sex: Treating RN: 1951/05/05 (71 y.o. [name] Primary Care Saylah Ketner: [name] Other Clinician: Referring Yoshito Gaza: Treating Graylin Sperling/Extender: Milagros Evener in Treatment: 20 Visit Information History Since Last Visit Added or deleted any medications: No Patient Arrived: Wheel Chair Any new allergies or adverse reactions: No Arrival Time: 12:36 Had a fall or experienced change in No Accompanied By: sister activities of daily living that may affect Transfer Assistance: Manual risk of falls: Patient Identification Verified: Yes Signs or symptoms of abuse/neglect since last visito No Patient Requires Transmission-Based Precautions: No Hospitalized since last visit: No Patient Has Alerts: Yes Implantable device outside of the clinic excluding No Patient Alerts: Patient on Blood Thinner cellular tissue based products placed in the center since last visit: Has Dressing in Place as Prescribed: Yes Pain Present Now: No Electronic Signature(s) Signed: 04/27/2022 4:50:18 PM By: Dellie Catholic RN Entered By: Dellie Catholic on 04/27/2022 12:43:04 -------------------------------------------------------------------------------- Encounter Discharge Information Details Patient Name: Date of Service: [name], [name] 04/27/2022 12:30 PM Medical Record Number: [id number] Patient Account Number: [id number] Date of Birth/Sex: Treating RN: 1951/04/09 (71 y.o. [name] Primary Care Malaiah Viramontes: [name] Other Clinician: Referring Ethyl Vila: Treating Derward Marple/Extender: Milagros Evener in Treatment: 20 Encounter Discharge Information Items Post Procedure Vitals Discharge  Condition: Stable Temperature (F): 97.9 Ambulatory Status: Wheelchair Pulse (bpm): 51 Discharge Destination: Home Respiratory Rate (breaths/min): 16 Transportation: Private Auto Blood Pressure (mmHg): 137/76 Accompanied By: sister Schedule Follow-up Appointment: Yes Clinical Summary of Care: Patient Declined Electronic Signature(s) Signed: 04/27/2022 4:50:18 PM By: Dellie Catholic RN Entered By: Dellie Catholic on 04/27/2022 13:54:34 -------------------------------------------------------------------------------- Lower Extremity Assessment Details Patient Name: Date of Service: [name], [name] 04/27/2022 12:30 PM Medical Record Number: [id number] Patient Account Number: [id number] Date of Birth/Sex: Treating RN: 09-28-1950 (71 y.o. [name] Primary Care Kiala Faraj: [name] Other Clinician: Referring Donzell Coller: Treating Lynsey Ange/Extender: Milagros Evener in Treatment: 20 Edema Assessment Assessed: Shirlyn Goltz: No] [name]: No] Edema: [Left: [name]] [Right: o] Calf Left: Right: Point of Measurement: From Medial Instep 32.8 cm Ankle Left: Right: Point of Measurement: From Medial Instep 23.9 cm Electronic Signature(s) Signed: 04/27/2022 4:50:18 PM By: Dellie Catholic RN Entered By: Dellie Catholic on 04/27/2022 12:48:33 -------------------------------------------------------------------------------- Multi Wound Chart Details Patient Name: Date of Service: [name], [name] 04/27/2022 12:30 PM Medical Record Number: [id number] Patient Account Number: [id number] Date of Birth/Sex: Treating RN: 03/24/1951 (71 y.o. [name] Primary Care Adamarys Shall: [name] Other Clinician: Referring Terrion Gencarelli: Treating Daunte Oestreich/Extender: Milagros Evener in Treatment: 20 Vital Signs Height(in): 18 Pulse(bpm): 22 Weight(lbs): 165 Blood Pressure(mmHg): 137/76 Body Mass Index(BMI): 24.4 Temperature(F):  97.9 Respiratory Rate(breaths/min): 16 Photos: [[name]/A:[name]/A] Right Calcaneus Right, Plantar T Great oe [name]/A Wound Location: Pressure Injury Gradually Appeared [name]/A Wounding Event: Pressure Ulcer T be determined o [name]/A Primary Etiology: Cataracts, Hypertension, Cirrhosis , Cataracts, Hypertension, Cirrhosis , [name]/A Comorbid History: Hepatitis C Hepatitis C 07/02/2020 04/13/2022 [name]/A Date Acquired: 20 2 [name]/A Weeks of Treatment: Open Open [name]/A Wound Status: No No [name]/A Wound Recurrence: 1.8x2.6x0.3 0.3x0.4x0.1 [name]/A Measurements L x W x D (cm) 3.676 0.094 [name]/A A (cm) : rea 1.103 0.009 [name]/A Volume (cm) : 85.10% 52.00% [name]/A % Reduction in A rea: 91.10%  55.00% [name]/A % Reduction in Volume: 3 Starting Position 1 (o'clock): 7 Ending Position 1 (o'clock): 1.1 Maximum Distance 1 (cm): Yes No [name]/A Undermining: Category/Stage IV Full Thickness Without Exposed [name]/A Classification: Support Structures Medium Medium [name]/A Exudate A mount: Serosanguineous Serosanguineous [name]/A Exudate Type: red, brown red, brown [name]/A Exudate Color: Well defined, not attached [name]/A [name]/A Wound Margin: Large (67-100%) Medium (34-66%) [name]/A Granulation A mount: Red, Pink Red [name]/A Granulation Quality: Small (1-33%) Medium (34-66%) [name]/A Necrotic A mount: Adherent Slough Eschar, Adherent Slough [name]/A Necrotic Tissue: Fat Layer (Subcutaneous Tissue): Yes Fat Layer (Subcutaneous Tissue): Yes [name]/A Exposed Structures: Fascia: No Fascia: No Tendon: No Tendon: No Muscle: No Muscle: No Joint: No Joint: No Bone: No Bone: No Small (1-33%) None [name]/A Epithelialization: Debridement - Excisional [name]/A [name]/A Debridement: Pre-procedure Verification/Time Out 13:01 [name]/A [name]/A Taken: Lidocaine 4% Topical Solution [name]/A [name]/A Pain Control: Callus, Subcutaneous, Slough [name]/A [name]/A Tissue Debrided: Skin/Subcutaneous Tissue [name]/A [name]/A Level: 5.04 [name]/A [name]/A Debridement A (sq cm): rea Curette [name]/A [name]/A Instrument: Minimum [name]/A [name]/A Bleeding: Pressure  [name]/A [name]/A Hemostasis A chieved: 0 [name]/A [name]/A Procedural Pain: 0 [name]/A [name]/A Post Procedural Pain: Procedure was tolerated well [name]/A [name]/A Debridement Treatment Response: 1.8x2.8x0.3 [name]/A [name]/A Post Debridement Measurements L x W x D (cm) 1.188 [name]/A [name]/A Post Debridement Volume: (cm) Category/Stage IV [name]/A [name]/A Post Debridement Stage: Debridement [name]/A [name]/A Procedures Performed: Treatment Notes Electronic Signature(s) Signed: 04/27/2022 1:10:03 PM By: [name] MD FACS Signed: 04/27/2022 4:50:18 PM By: Dellie Catholic RN Entered By: [name] on 04/27/2022 13:10:03 -------------------------------------------------------------------------------- [place] Details Patient Name: Date of Service: [name], [place] [name] 04/27/2022 12:30 PM Medical Record Number: [id number] Patient Account Number: [id number] Date of Birth/Sex: Treating RN: 26-Mar-1951 (71 y.o. [name] Primary Care Keelen Quevedo: [name] Other Clinician: Referring Lige Lakeman: Treating Obadiah Dennard/Extender: Milagros Evener in Treatment: 20 Active Inactive Abuse / Safety / Falls / Self Care Management Nursing Diagnoses: History of Falls Impaired physical mobility Potential for falls Potential for injury related to falls Goals: Patient will remain injury free related to falls Date Initiated: 12/08/2021 Date Inactivated: 01/13/2022 Target Resolution Date: 01/05/2022 Goal Status: Met Patient/caregiver will verbalize understanding of skin care regimen Date Initiated: 12/08/2021 Target Resolution Date: 07/01/2022 Goal Status: Active Interventions: Assess fall risk on admission and as needed Assess: immobility, friction, shearing, incontinence upon admission and as needed Assess impairment of mobility on admission and as needed per policy Provide education on fall prevention Notes: Wound/Skin Impairment Nursing Diagnoses: Impaired tissue integrity Knowledge deficit related  to ulceration/compromised skin integrity Goals: Patient/caregiver will verbalize understanding of skin care regimen Date Initiated: 12/08/2021 Target Resolution Date: 07/01/2022 Goal Status: Active Ulcer/skin breakdown will have a volume reduction of 30% by week 4 Date Initiated: 12/08/2021 Date Inactivated: 01/13/2022 Target Resolution Date: 01/05/2022 Goal Status: Met Interventions: Assess patient/caregiver ability to perform ulcer/skin care regimen upon admission and as needed Assess ulceration(s) every visit Provide education on ulcer and skin care Treatment Activities: Skin care regimen initiated : 12/08/2021 Topical wound management initiated : 12/08/2021 Notes: Electronic Signature(s) Signed: 04/27/2022 4:50:18 PM By: Dellie Catholic RN Entered By: Dellie Catholic on 04/27/2022 13:53:16 -------------------------------------------------------------------------------- Pain Assessment Details Patient Name: Date of Service: [name], [place] [name] 04/27/2022 12:30 PM Medical Record Number: [id number] Patient Account Number: [id number] Date of Birth/Sex: Treating RN: 06-19-1951 (71 y.o. [name] Primary Care Quetzalli Clos: [name] Other Clinician: Referring Roshawn Lacina: Treating Rhydian Baldi/Extender: Milagros Evener in Treatment: 20 Active  Problems Location of Pain Severity and Description of Pain Patient Has Paino No Site Locations Pain Management and Medication Current Pain Management: Electronic Signature(s) Signed: 04/27/2022 4:50:18 PM By: Dellie Catholic RN Entered By: Dellie Catholic on 04/27/2022 12:47:45 -------------------------------------------------------------------------------- Patient/Caregiver Education Details Patient Name: Date of Service: [name] 9/26/2023andnbsp12:30 PM Medical Record Number: [id number] Patient Account Number: [id number] Date of Birth/Gender: Treating RN: 04/30/1951 (71 y.o. [name] Primary Care Physician: [name] Other Clinician: Referring Physician: Treating Physician/Extender: Milagros Evener in Treatment: 20 Education Assessment Education Provided To: Patient Education Topics Provided Wound/Skin Impairment: Methods: Explain/Verbal Responses: Return demonstration correctly Electronic Signature(s) Signed: 04/27/2022 4:50:18 PM By: Dellie Catholic RN Entered By: Dellie Catholic on 04/27/2022 13:53:31 -------------------------------------------------------------------------------- Wound Assessment Details Patient Name: Date of Service: [name], [place] [name] 04/27/2022 12:30 PM Medical Record Number: [id number] Patient Account Number: [id number] Date of Birth/Sex: Treating RN: 04/03/1951 (71 y.o. [name] Primary Care Tykel Badie: [name] Other Clinician: Referring Mikiah Demond: Treating Patty Leitzke/Extender: Milagros Evener in Treatment: 20 Wound Status Wound Number: 1 Primary Etiology: Pressure Ulcer Wound Location: Right Calcaneus Wound Status: Open Wounding Event: Pressure Injury Comorbid History: Cataracts, Hypertension, Cirrhosis , Hepatitis C Date Acquired: 07/02/2020 Weeks Of Treatment: 20 Clustered Wound: No Photos Wound Measurements Length: (cm) 1.8 Width: (cm) 2.6 Depth: (cm) 0.3 Area: (cm) 3.676 Volume: (cm) 1.103 % Reduction in Area: 85.1% % Reduction in Volume: 91.1% Epithelialization: Small (1-33%) Undermining: Yes Starting Position (o'clock): 3 Ending Position (o'clock): 7 Maximum Distance: (cm) 1.1 Wound Description Classification: Category/Stage IV Wound Margin: Well defined, not attached Exudate Amount: Medium Exudate Type: Serosanguineous Exudate Color: red, brown Foul Odor After Cleansing: No Slough/Fibrino Yes Wound Bed Granulation Amount: Large (67-100%) Exposed Structure Granulation Quality: Red, Pink Fascia Exposed: No Necrotic Amount: Small  (1-33%) Fat Layer (Subcutaneous Tissue) Exposed: Yes Necrotic Quality: Adherent Slough Tendon Exposed: No Muscle Exposed: No Joint Exposed: No Bone Exposed: No Treatment Notes Wound #1 (Calcaneus) Wound Laterality: Right Cleanser Soap and Water Discharge Instruction: May shower and wash wound with dial antibacterial soap and water prior to dressing change. Wound Cleanser Discharge Instruction: Cleanse the wound with wound cleanser prior to applying a clean dressing using gauze sponges, not tissue or cotton balls. Peri-Wound Care Topical Primary Dressing KerraCel Ag Gelling Fiber Dressing, 4x5 in (silver alginate) Discharge Instruction: Apply silver alginate to wound bed as instructed Iodoform packing strip 1/4 (in) Discharge Instruction: Lightly pack as instructed Secondary Dressing ALLEVYN Heel 4 1/2in x 5 1/2in / 10.5cm x 13.5cm Discharge Instruction: Apply over primary dressing as directed. Woven Gauze Sponge, Non-Sterile 4x4 in Discharge Instruction: Apply over primary dressing as directed. Secured With Elastic Bandage 4 inch (ACE bandage) Discharge Instruction: Secure with ACE bandage as directed. Kerlix Roll Sterile, 4.5x3.1 (in/yd) Discharge Instruction: Secure with Kerlix as directed. 100M Medipore Soft Cloth Surgical T 2x10 (in/yd) ape Discharge Instruction: Secure with tape as directed. Compression Wrap Compression Stockings Add-Ons foot cover Discharge Instruction: use foot covers Electronic Signature(s) Signed: 04/27/2022 4:50:18 PM By: Dellie Catholic RN Entered By: Dellie Catholic on 04/27/2022 12:56:24 -------------------------------------------------------------------------------- Wound Assessment Details Patient Name: Date of Service: [name], [place] [name] 04/27/2022 12:30 PM Medical Record Number: [id number] Patient Account Number: [id number] Date of Birth/Sex: Treating RN: 17-Jan-1951 (71 y.o. [name] Primary Care Markell Schrier: [name] Other Clinician: Referring Cleone Hulick: Treating Isobelle Tuckett/Extender: Milagros Evener in Treatment: 20 Wound Status Wound Number: 2 Primary  Etiology: T be determined o Wound Location: Right, Plantar T Great oe Wound Status: Open Wounding Event: Gradually Appeared Comorbid History: Cataracts, Hypertension, Cirrhosis , Hepatitis C Date Acquired: 04/13/2022 Weeks Of Treatment: 2 Clustered Wound: No Photos Wound Measurements Length: (cm) 0.3 Width: (cm) 0.4 Depth: (cm) 0.1 Area: (cm) 0.094 Volume: (cm) 0.009 % Reduction in Area: 52% % Reduction in Volume: 55% Epithelialization: None Tunneling: No Undermining: No Wound Description Classification: Full Thickness Without Exposed Support Structures Exudate Amount: Medium Exudate Type: Serosanguineous Exudate Color: red, brown Foul Odor After Cleansing: No Slough/Fibrino Yes Wound Bed Granulation Amount: Medium (34-66%) Exposed Structure Granulation Quality: Red Fascia Exposed: No Necrotic Amount: Medium (34-66%) Fat Layer (Subcutaneous Tissue) Exposed: Yes Necrotic Quality: Eschar, Adherent Slough Tendon Exposed: No Muscle Exposed: No Joint Exposed: No Bone Exposed: No Treatment Notes Wound #2 (Toe Great) Wound Laterality: Plantar, Right Cleanser Soap and Water Discharge Instruction: May shower and wash wound with dial antibacterial soap and water prior to dressing change. Wound Cleanser Discharge Instruction: Cleanse the wound with wound cleanser prior to applying a clean dressing using gauze sponges, not tissue or cotton balls. Peri-Wound Care Topical Primary Dressing KerraCel Ag Gelling Fiber Dressing, 4x5 in (silver alginate) Discharge Instruction: Apply silver alginate to wound bed as instructed Secondary Dressing Woven Gauze Sponge, Non-Sterile 4x4 in Discharge Instruction: Apply over primary dressing as directed. Secured With [employer] Surgical T 2x10  (in/yd) ape Discharge Instruction: Secure with tape as directed. Compression Wrap Compression Stockings Add-Ons foot cover Discharge Instruction: use foot covers Electronic Signature(s) Signed: 04/27/2022 4:50:18 PM By: Dellie Catholic RN Entered By: Dellie Catholic on 04/27/2022 12:55:18 -------------------------------------------------------------------------------- [place] Details Patient Name: Date of Service: [name], [name] 04/27/2022 12:30 PM Medical Record Number: [id number] Patient Account Number: [id number] Date of Birth/Sex: Treating RN: [birth year] (71 y.o. [name] Primary Care Crixus Mcaulay: [name] Other Clinician: Referring Rodney Yera: Treating Tryson Lumley/Extender: Milagros Evener in Treatment: 20 Vital Signs Time Taken: 12:40 Temperature (F): 97.9 Height (in): 69 Pulse (bpm): 51 Weight (lbs): 165 Respiratory Rate (breaths/min): 16 Body Mass Index (BMI): 24.4 Blood Pressure (mmHg): 137/76 Reference Range: 80 - 120 mg / dl Electronic Signature(s) Signed: 04/27/2022 4:50:18 PM By: Dellie Catholic RN Entered By: Dellie Catholic on 04/27/2022 12:53:15

## 2022-05-11 ENCOUNTER — Encounter (HOSPITAL_BASED_OUTPATIENT_CLINIC_OR_DEPARTMENT_OTHER): Payer: 59 | Attending: General Surgery | Admitting: General Surgery

## 2022-05-11 DIAGNOSIS — I10 Essential (primary) hypertension: Secondary | ICD-10-CM | POA: Diagnosis not present

## 2022-05-11 DIAGNOSIS — L97512 Non-pressure chronic ulcer of other part of right foot with fat layer exposed: Secondary | ICD-10-CM | POA: Diagnosis present

## 2022-05-11 DIAGNOSIS — B182 Chronic viral hepatitis C: Secondary | ICD-10-CM | POA: Insufficient documentation

## 2022-05-11 DIAGNOSIS — L89610 Pressure ulcer of right heel, unstageable: Secondary | ICD-10-CM | POA: Insufficient documentation

## 2022-05-11 DIAGNOSIS — K7460 Unspecified cirrhosis of liver: Secondary | ICD-10-CM | POA: Diagnosis not present

## 2022-05-14 NOTE — Progress Notes (Signed)
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 Debridement with a total area of 4.32 sq cm performed by [name], MD. With the following instrument(s): Curette to remove Viable and Non-Viable tissue/material. Material removed includes Eschar, Subcutaneous Tissue, Slough, and Skin: Epidermis after achieving pain control using Lidocaine 4% T opical Solution. No specimens were taken. A time out was conducted at 12:54, prior to the start of the procedure. A Minimum amount of bleeding was controlled with N/A. The procedure was tolerated well with a pain level of 0 throughout and a pain level of 0 following the procedure. Post Debridement Measurements: 1.8cm length x 2.4cm width x 0.3cm depth; 1.018cm^3 volume. Post debridement Stage noted as Category/Stage IV. Character of Wound/Ulcer Post Debridement requires further debridement. Post procedure Diagnosis Wound #1: Same as Pre-Procedure General  Notes: Scribed for Dr. Celine Ahr by Blanche East, RN. Plan Follow-up Appointments: Return Appointment in 2 weeks. - Dr. Celine Ahr Room 3 Bathing/ Shower/ Hygiene: May shower and wash wound with soap and water. - with dressing changes Home Health: New wound care orders this week; continue Home Health for wound care. May utilize formulary equivalent dressing for wound treatment orders unless otherwise specified. - iodoform Packing strips to undermining area of wound, cover with silver alginate, gauze, heel cup and kerlix silver alginate and gauze to toe Dressing changes to be completed by [place] on Monday / Wednesday / Friday except when patient has scheduled visit at [facility]. Other Home Health Orders/Instructions: - [facility] 3x a week WOUND #1: - Calcaneus Wound Laterality: Right Cleanser: Soap and Water 3 x Per Week/30 Days Discharge Instructions: May shower and wash wound with dial antibacterial soap and water prior to dressing change. Cleanser: Wound Cleanser 3 x Per Week/30 Days Discharge Instructions: Cleanse the wound with wound cleanser prior to applying a clean dressing using gauze sponges, not tissue or cotton balls. Prim Dressing: KerraCel Ag Gelling Fiber Dressing, 4x5 in (silver alginate) 3 x Per Week/30 Days ary Discharge Instructions: Apply silver alginate to wound bed as instructed Prim Dressing: Iodoform packing strip 1/4 (in) 3 x Per Week/30 Days ary Discharge Instructions: Lightly pack as instructed Secondary Dressing: ALLEVYN Heel 4 1/2in x 5 1/2in / 10.5cm x 13.5cm (Generic) 3 x Per Week/30 Days Discharge Instructions: Apply over primary dressing as directed. Secondary Dressing: Woven Gauze Sponge, Non-Sterile 4x4 in 3 x Per Week/30 Days Discharge Instructions: Apply over primary dressing as directed. Secured With: Elastic Bandage 4 inch (ACE bandage) (Generic) 3 x Per Week/30 Days Discharge Instructions: Secure with ACE bandage as directed. Secured With:  [employer], 4.5x3.1 (in/yd) (Generic) 3 x Per Week/30 Days Discharge Instructions: Secure with Kerlix as directed. Secured With: 69M Medipore Public affairs consultant Surgical T 2x10 (in/yd) (Generic) 3 x Per Week/30 Days ape Discharge Instructions: Secure with tape as directed. Add-Ons: foot cover (Generic) 3 x Per Week/30 Days Discharge Instructions: use foot covers MARIE CHOW, [name] ([id number]) [id number] Page 8 of 10 05/11/2022: The right great toe site has healed. The right calcaneal wound  is a little bit smaller, but still has a fair amount of undermining. There is slough and nonviable subcutaneous tissue accumulation, particularly around the most posterior portion of the wound. I used a curette to debride slough and subcutaneous tissue from the heel wound. We will continue to pack iodoform into the undermined portion of the wound and cover the exposed heel wound with silver alginate. Continue offloading, which was reemphasized with the patient today. Follow-up in 2 weeks. Electronic Signature(s) Signed: 05/11/2022 1:21:13 PM By: [name] MD FACS Entered By: [name] on 05/11/2022 13:21:13 -------------------------------------------------------------------------------- HxROS Details Patient Name: Date of Service: [name], [place] [name] 05/11/2022 12:30 PM Medical Record Number: [id number] Patient Account Number: [id number] Date of Birth/Sex: Treating RN: Jan 25, 1951 (71 y.o. M) Primary Care Provider: [name] Other Clinician: Referring Provider: Treating Provider/Extender: Milagros Evener in Treatment: 22 Information Obtained From Patient Eyes Medical History: Positive for: Cataracts Hematologic/Lymphatic Medical History: Past Medical History Notes: hyperlipidemia Cardiovascular Medical History: Positive for: Hypertension Gastrointestinal Medical History: Positive for: Cirrhosis ; Hepatitis C - says it  is gone Past Medical History Notes: colon polyps Genitourinary Medical History: Past Medical History Notes: BPH Musculoskeletal Medical History: Past Medical History Notes: arthritis Neurologic Medical History: Past Medical History Notes: stroke HBO Extended History Items BRAESON, RUPE ([id number]) [id number] Page 9 of 10 Eyes: Cataracts Immunizations Pneumococcal Vaccine: Received Pneumococcal Vaccination: Yes Received Pneumococcal Vaccination On or After 60th Birthday: Yes Implantable Devices None Hospitalization / Surgery History Type of Hospitalization/Surgery inguinal hernia repair knee surgery x4 Family and Social History Cancer: Yes - Mother,Father; Diabetes: Yes - Siblings; Heart Disease: Yes - Maternal Grandparents; Hereditary Spherocytosis: No; Hypertension: Yes - Mother; Kidney Disease: No; Lung Disease: Yes - Mother; Seizures: No; Stroke: Yes - Mother; Thyroid Problems: Yes - Siblings; Tuberculosis: No; Former smoker - ended on 09/25/1982; Marital Status - Single; Alcohol Use: Never; Drug Use: Prior History - cocaine, heroin, marijuana; Caffeine Use: Never; Financial Concerns: No; Food, Clothing or Shelter Needs: No; Support System Lacking: Yes; Transportation Concerns: No Electronic Signature(s) Signed: 05/11/2022 1:54:01 PM By: [name] MD FACS Entered By: [name] on 05/11/2022 13:19:40 -------------------------------------------------------------------------------- SuperBill Details Patient Name: Date of Service: [name], [place] [name] 05/11/2022 Medical Record Number: [id number] Patient Account Number: [id number] Date of Birth/Sex: Treating RN: 1951/04/11 (71 y.o. 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), and cerebral infarction without residual deficits K74.60 Unspecified cirrhosis of liver B18.2 Chronic viral hepatitis C I63.9 Cerebral infarction, unspecified I10 Essential (primary) hypertension L97.512 Non-pressure chronic ulcer of other part of right foot with fat layer exposed Facility Procedures : CPT4 Code: [id number] Description: [place] - DEB SUBQ TISSUE 20 SQ CM/< ICD-10 Diagnosis Description L89.610 Pressure ulcer of right heel, unstageable Modifier: Quantity: 1 Physician Procedures : CPT4 Code Description Modifier [id number] - WC PHYS LEVEL 3 - EST PT 25 ICD-10 Diagnosis Description L89.610 Pressure ulcer of right heel, unstageable B18.2 Chronic viral hepatitis C I10 Essential (primary) hypertension K74.60 Unspecified cirrhosis  of liver [name], [place] ([id number]) [phone].pdf P Quantity: 1 age 45 of 8 : [id number] - WC PHYS SUBQ TISS 20 SQ CM 1 ICD-10 Diagnosis Description L89.610 Pressure ulcer of right heel, unstageable Quantity: Electronic Signature(s) Signed: 05/11/2022 1:21:37 PM By: [name] MD FACS  Entered By: [name] on 05/11/2022 13:21:37

## 2022-05-14 NOTE — Progress Notes (Signed)
[place], [name] ([id number]) [id number] Page 1 of 8 Visit Report for 05/11/2022 Arrival Information Details Patient Name: Date of Service: [name] 05/11/2022 12:30 PM Medical Record Number: [id number] Patient Account Number: [id number] Date of Birth/Sex: Treating RN: 11-25-1950 (71 y.o. [name] Primary Care [name]: [name] Other Clinician: Referring [name]: Treating [name]/Extender: [name] in Treatment: 81 Visit Information History Since Last Visit All ordered tests and consults were completed: Yes Patient Arrived: Wheel Chair Added or deleted any medications: No Arrival Time: 12:40 Any new allergies or adverse reactions: No Accompanied By: sister Had a fall or experienced change in No Transfer Assistance: EasyPivot Patient Lift activities of daily living that may affect Patient Identification Verified: Yes risk of falls: Secondary Verification Process Completed: Yes Signs or symptoms of abuse/neglect since last visito No Patient Requires Transmission-Based Precautions: No Hospitalized since last visit: No Patient Has Alerts: Yes Implantable device outside of the clinic excluding No Patient Alerts: Patient on Blood Thinner cellular tissue based products placed in the center since last visit: Has Dressing in Place as Prescribed: Yes Pain Present Now: No Electronic Signature(s) Signed: 05/14/2022 5:15:19 PM By: Micheal East RN Entered By: [name] on 05/11/2022 12:41:16 -------------------------------------------------------------------------------- Lower Extremity Assessment Details Patient Name: Date of Service: [name] 05/11/2022 12:30 PM Medical Record Number: [id number] Patient Account Number: [id number] Date of Birth/Sex: Treating RN: 07-Jun-1951 (71 y.o. [name] Primary Care [name]: [name] Other Clinician: Referring  [name]: Treating [name]/Extender: [name] in Treatment: 22 [name]: No] [name]: No] Edema: [Left: [name]] [Right: o] Calf Left: Right: Point of Measurement: From Medial Instep 32.8 cm Ankle Left: Right: Point of Measurement: From Medial Instep 22.5 cm Electronic Signature(s) Signed: 05/14/2022 5:15:19 PM By: Micheal East RN Entered By: [name] on 05/11/2022 12:44:07 [place] [name] ([id number]) [id number] Page 2 of 8 -------------------------------------------------------------------------------- Multi Wound Chart Details Patient Name: Date of Service: [name] 05/11/2022 12:30 PM Medical Record Number: [id number] Patient Account Number: [id number] Date of Birth/Sex: Treating RN: [birth year] (71 y.o. M) Primary Care [name]: [name] Other Clinician: Referring [name]: Treating [name]/Extender: [name] in Treatment: 22 Vital Signs Height(in): 51 Pulse(bpm): 60 Weight(lbs): 165 Blood Pressure(mmHg): 129/70 Body Mass Index(BMI): 24.4 Temperature(F): 97.7 Respiratory Rate(breaths/min): 16 [1:Photos:] [[name]/A:[name]/A] Right Calcaneus Right, Plantar T Great oe [name]/A Wound Location: Pressure Injury Gradually Appeared [name]/A Wounding Event: Pressure Ulcer T be determined o [name]/A Primary Etiology: Cataracts, Hypertension, Cirrhosis , Cataracts, Hypertension, Cirrhosis , [name]/A Comorbid History: Hepatitis C Hepatitis C 07/02/2020 04/13/2022 [name]/A Date Acquired: 22 4 [name]/A Weeks of Treatment: Open Healed - Epithelialized [name]/A Wound Status: No No [name]/A Wound Recurrence: 1.8x2.4x0.3 0x0x0 [name]/A Measurements L x W x D (cm) 3.393 0 [name]/A A (cm) : rea 1.018 0 [name]/A Volume (cm) : 86.30% 100.00% [name]/A % Reduction in A rea: 91.80% 100.00% [name]/A % Reduction in Volume: 10 Starting Position 1 (o'clock): 1 Ending Position 1 (o'clock): 1.8 Maximum Distance 1  (cm): Yes No [name]/A Undermining: Category/Stage IV Full Thickness Without Exposed [name]/A Classification: Support Structures Medium None Present [name]/A Exudate A mount: Serosanguineous [name]/A [name]/A Exudate Type: red, brown [name]/A [name]/A Exudate Color: Well defined, not attached [name]/A [name]/A Wound Margin: Large (67-100%) Large (67-100%) [name]/A Granulation A mount: Red, Pink Red [name]/A Granulation Quality: Small (1-33%) None Present (0%) [name]/A Necrotic A mount: Fat Layer (Subcutaneous Tissue): Yes Fascia: No [name]/A  Exposed Structures: Fascia: No Fat Layer (Subcutaneous Tissue): No Tendon: No Tendon: No Muscle: No Muscle: No Joint: No Joint: No Bone: No Bone: No Small (1-33%) None [name]/A Epithelialization: Debridement - Excisional [name]/A [name]/A Debridement: Pre-procedure Verification/Time Out 12:54 [name]/A [name]/A Taken: Lidocaine 4% Topical Solution [name]/A [name]/A Pain Control: Necrotic/Eschar, Subcutaneous, [name]/A [name]/A Tissue Debrided: Slough Skin/Subcutaneous Tissue [name]/A [name]/A Level: 4.32 [name]/A [name]/A Debridement A (sq cm): rea Curette [name]/A [name]/A Instrument: Minimum [name]/A [name]/A Bleeding: 0 [name]/A [name]/A Procedural Pain: 0 [name]/A [name]/A Post Procedural PainTARIS, [name] ([id number]) [id number] Page 3 of 8 Procedure was tolerated well [name]/A [name]/A Debridement Treatment Response: 1.8x2.4x0.3 [name]/A [name]/A Post Debridement Measurements L x W x D (cm) 1.018 [name]/A [name]/A Post Debridement Volume: (cm) Category/Stage IV [name]/A [name]/A Post Debridement Stage: Callus: Yes Callus: No [name]/A Periwound Skin Texture: Excoriation: No Crepitus: No Induration: No Crepitus: No Rash: No Scarring: No Maceration: Yes Maceration: No [name]/A Periwound Skin Moisture: Dry/Scaly: No Atrophie Micheal: No [name]/A Periwound Skin Color: Cyanosis: No Ecchymosis: No Erythema: No Hemosiderin Staining: No Mottled: No Pallor: No Rubor: No Debridement [name]/A [name]/A Procedures Performed: Treatment Notes Electronic Signature(s) Signed: 05/11/2022 1:17:30 PM  By: [name] MD FACS Entered By: [name] on 05/11/2022 13:17:30 -------------------------------------------------------------------------------- Multi-Disciplinary Care Plan Details Patient Name: Date of Service: [name], [name] 05/11/2022 12:30 PM Medical Record Number: [id number] Patient Account Number: [id number] Date of Birth/Sex: Treating RN: [birth year] (71 y.o. [name] Primary Care River Mckercher: [name] Other Clinician: Referring Kamilla Hands: Treating Hason Ofarrell/Extender: [name] in Treatment: 22 Active Inactive Abuse / Safety / Falls / Self Care Management Nursing Diagnoses: History of Falls Impaired physical mobility Potential for falls Potential for injury related to falls Goals: Patient will remain injury free related to falls Date Initiated: 12/08/2021 Date Inactivated: 01/13/2022 Target Resolution Date: 01/05/2022 Goal Status: Met Patient/caregiver will verbalize understanding of skin care regimen Date Initiated: 12/08/2021 Target Resolution Date: 07/01/2022 Goal Status: Active Interventions: Assess fall risk on admission and as needed Assess: immobility, friction, shearing, incontinence upon admission and as needed Assess impairment of mobility on admission and as needed per policy Provide education on fall prevention Notes: Wound/Skin Impairment Nursing Diagnoses: [place], [name] ([id number]) [id number] Page 4 of 8 Impaired tissue integrity Knowledge deficit related to ulceration/compromised skin integrity Goals: Patient/caregiver will verbalize understanding of skin care regimen Date Initiated: 12/08/2021 Target Resolution Date: 07/01/2022 Goal Status: Active Ulcer/skin breakdown will have a volume reduction of 30% by week 4 Date Initiated: 12/08/2021 Date Inactivated: 01/13/2022 Target Resolution Date: 01/05/2022 Goal Status: Met Interventions: Assess patient/caregiver  ability to perform ulcer/skin care regimen upon admission and as needed Assess ulceration(s) every visit Provide education on ulcer and skin care Treatment Activities: Skin care regimen initiated : 12/08/2021 Topical wound management initiated : 12/08/2021 Notes: Electronic Signature(s) Signed: 05/14/2022 5:15:19 PM By: Micheal East RN Entered By: [name] on 05/11/2022 12:49:40 -------------------------------------------------------------------------------- Pain Assessment Details Patient Name: Date of Service: [name], [name] 05/11/2022 12:30 PM Medical Record Number: [id number] Patient Account Number: [id number] Date of Birth/Sex: Treating RN: [birth year] (71 y.o. [name] Primary Care Kalifa Cadden: [name] Other Clinician: Referring Tanna Loeffler: Treating Mixtli Reno/Extender: [name] in Treatment: 22 Active Problems Location of Pain Severity and Description of Pain Patient Has Paino No Site Locations Pain Management and Medication Current Pain Management: Electronic Signature(s) Signed: 05/14/2022 5:15:19 PM By: Micheal East RN Entered By: [name] on 05/11/2022 12:42:45 [name], [name] ([id number])  [phone].pdf Page 5 of 8 -------------------------------------------------------------------------------- Patient/Caregiver Education Details Patient Name: Date of Service: [name] 10/10/2023andnbsp12:30 PM Medical Record Number: [id number] Patient Account Number: [id number] Date of Birth/Gender: Treating RN: 04-Jul-1951 (71 y.o. [name] Primary Care Physician: [name] Other Clinician: Referring Physician: Treating Physician/Extender: [name] in Treatment: 22 Education Assessment Education Provided To: Patient Education Topics Provided Safety: Methods: Explain/Verbal Responses: Reinforcements needed, State content correctly Wound/Skin  Impairment: Methods: Explain/Verbal Responses: Reinforcements needed, State content correctly Electronic Signature(s) Signed: 05/14/2022 5:15:19 PM By: Micheal East RN Entered By: [name] on 05/11/2022 12:49:57 -------------------------------------------------------------------------------- Wound Assessment Details Patient Name: Date of Service: [name], [name] 05/11/2022 12:30 PM Medical Record Number: [id number] Patient Account Number: [id number] Date of Birth/Sex: Treating RN: [birth year] (71 y.o. [name] Primary Care Demetra Moya: [name] Other Clinician: Referring Hanad Leino: Treating Siarra Gilkerson/Extender: [name] in Treatment: 22 Wound Status Wound Number: 1 Primary Etiology: Pressure Ulcer Wound Location: Right Calcaneus Wound Status: Open Wounding Event: Pressure Injury Comorbid History: Cataracts, Hypertension, Cirrhosis , Hepatitis C Date Acquired: 07/02/2020 Weeks Of Treatment: 22 Clustered Wound: No Photos [name], [name] ([id number]) [id number] Page 6 of 8 Wound Measurements Length: (cm) 1.8 Width: (cm) 2.4 Depth: (cm) 0.3 Area: (cm) 3.393 Volume: (cm) 1.018 % Reduction in Area: 86.3% % Reduction in Volume: 91.8% Epithelialization: Small (1-33%) Tunneling: No Undermining: Yes Starting Position (o'clock): 10 Ending Position (o'clock): 1 Maximum Distance: (cm) 1.8 Wound Description Classification: Category/Stage IV Wound Margin: Well defined, not attached Exudate Amount: Medium Exudate Type: Serosanguineous Exudate Color: red, brown Foul Odor After Cleansing: No Slough/Fibrino Yes Wound Bed Granulation Amount: Large (67-100%) Exposed Structure Granulation Quality: Red, Pink Fascia Exposed: No Necrotic Amount: Small (1-33%) Fat Layer (Subcutaneous Tissue) Exposed: Yes Necrotic Quality: Adherent Slough Tendon Exposed: No Muscle Exposed: No Joint Exposed: No Bone  Exposed: No Periwound Skin Texture Texture Color No Abnormalities Noted: No No Abnormalities Noted: No Callus: Yes Atrophie Micheal: No Crepitus: No Cyanosis: No Excoriation: No Ecchymosis: No Induration: No Erythema: No Rash: No Hemosiderin Staining: No Scarring: No Mottled: No Pallor: No Moisture Rubor: No No Abnormalities Noted: No Dry / Scaly: No Maceration: Yes Electronic Signature(s) Signed: 05/11/2022 3:12:14 PM By: Micheal Creamer RN, BSN Signed: 05/14/2022 5:15:19 PM By: Micheal East RN Entered By: [name] on 05/11/2022 12:49:03 -------------------------------------------------------------------------------- Wound Assessment Details Patient Name: Date of Service: [name], [name] 05/11/2022 12:30 PM Medical Record Number: [id number] Patient Account Number: [id number] Date of Birth/Sex: Treating RN: 1950/11/07 (71 y.o. [name] Primary Care Bettina Warn: [name] Other Clinician: Referring Gabryel Talamo: Treating Gerik Coberly/Extender: Marliss Coots [place], [name] ([id number]) [id number] Page 7 of 8 Weeks in Treatment: 22 Wound Status Wound Number: 2 Primary Etiology: T be determined o Wound Location: Right, Plantar T Great oe Wound Status: Healed - Epithelialized Wounding Event: Gradually Appeared Comorbid History: Cataracts, Hypertension, Cirrhosis , Hepatitis C Date Acquired: 04/13/2022 Weeks Of Treatment: 4 Clustered Wound: No Photos Wound Measurements Length: (cm) Width: (cm) Depth: (cm) Area: (cm) Volume: (cm) 0 % Reduction in Area: 100% 0 % Reduction in Volume: 100% 0 Epithelialization: None 0 Tunneling: No 0 Undermining: No Wound Description Classification: Full Thickness Without Exposed Support Structures Exudate Amount: None Present Foul Odor After Cleansing: No Slough/Fibrino No Wound Bed Granulation Amount: Large (67-100%) Exposed Structure Granulation Quality:  Red Fascia Exposed: No Necrotic Amount: None Present (0%) Fat Layer (Subcutaneous Tissue) Exposed: No Tendon  Exposed: No Muscle Exposed: No Joint Exposed: No Bone Exposed: No Periwound Skin Texture Texture Color No Abnormalities Noted: No No Abnormalities Noted: No Callus: No Crepitus: No Moisture No Abnormalities Noted: No Maceration: No Electronic Signature(s) Signed: 05/14/2022 5:15:19 PM By: Micheal East RN Entered By: [name] on 05/11/2022 12:58:14 -------------------------------------------------------------------------------- [place] Details Patient Name: Date of Service: [name], [name] 05/11/2022 12:30 PM Medical Record Number: [id number] Patient Account Number: [id number] Date of Birth/Sex: Treating RN: [birth year] (71 y.o. [name] Primary Care Schylar Allard: [name] Other Clinician: Referring Evoleth Nordmeyer: Treating Kit Brubacher/Extender: Marliss Coots [place], [name] ([id number]) [id number] Page 8 of 8 Weeks in Treatment: 22 Vital Signs Time Taken: 12:41 Temperature (F): 97.7 Height (in): 69 Pulse (bpm): 66 Weight (lbs): 165 Respiratory Rate (breaths/min): 16 Body Mass Index (BMI): 24.4 Blood Pressure (mmHg): 129/70 Reference Range: 80 - 120 mg / dl Electronic Signature(s) Signed: 05/14/2022 5:15:19 PM By: Micheal East RN Entered By: [name] on 05/11/2022 12:42:26

## 2022-05-25 ENCOUNTER — Encounter (HOSPITAL_BASED_OUTPATIENT_CLINIC_OR_DEPARTMENT_OTHER): Payer: 59 | Admitting: General Surgery

## 2022-06-01 ENCOUNTER — Encounter (HOSPITAL_BASED_OUTPATIENT_CLINIC_OR_DEPARTMENT_OTHER): Payer: 59 | Admitting: General Surgery

## 2022-06-01 DIAGNOSIS — L97512 Non-pressure chronic ulcer of other part of right foot with fat layer exposed: Secondary | ICD-10-CM | POA: Diagnosis not present

## 2022-06-01 NOTE — Progress Notes (Signed)
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 procedure. A Minimum amount of bleeding was controlled with Pressure. The procedure was tolerated well with a pain level of 0 throughout and a pain level of 0 following the procedure. Post Debridement Measurements: 1.8cm length x 2cm width x 0.3cm depth; 0.848cm^3 volume. Post debridement Stage noted as Category/Stage IV. Character of Wound/Ulcer Post Debridement requires further debridement. Post procedure Diagnosis Wound #1: Same as Pre-Procedure General Notes: Scribed for Dr. Celine Ahr by Blanche East, RN. Plan Follow-up Appointments: Return Appointment in 2 weeks. - Dr. Celine Ahr Room 3 Bathing/ Shower/ Hygiene: May shower and wash wound with soap and water. - with dressing changes Home Health: New wound care orders this week; continue Home Health for wound care. May utilize formulary equivalent dressing for wound treatment orders unless otherwise specified. - iodoform Packing strips to undermining area of wound, cover with silver alginate, gauze, heel cup and kerlix  silver alginate and gauze to toe Dressing changes to be completed by [place] on Monday / Wednesday / Friday except when patient has scheduled visit at [facility]. Other Home Health Orders/Instructions: - [facility] 3x a week WOUND #1: - Calcaneus Wound Laterality: Right Cleanser: Soap and Water 3 x Per Week/30 Days Discharge Instructions: May shower and wash wound with dial antibacterial soap and water prior to dressing change. Cleanser: Wound Cleanser 3 x Per Week/30 Days Discharge Instructions: Cleanse the wound with wound cleanser prior to applying a clean dressing using gauze sponges, not tissue or cotton balls. Prim Dressing: KerraCel Ag Gelling Fiber Dressing, 4x5 in (silver alginate) 3 x Per Week/30 Days ary Discharge Instructions: Apply silver alginate to wound bed as instructed Prim Dressing: Iodoform packing strip 1/4 (in) 3 x Per Week/30 Days ary Discharge Instructions: Lightly pack as instructed Secondary Dressing: ALLEVYN Heel 4 1/2in x 5 1/2in / 10.5cm x 13.5cm (Generic) 3 x Per Week/30 Days Discharge Instructions: Apply over primary dressing as directed. Secondary Dressing: Woven Gauze Sponge, Non-Sterile 4x4 in 3 x Per Week/30 Days Discharge Instructions: Apply over primary dressing as directed. Secured With: Elastic Bandage 4 inch (ACE bandage) (Generic) 3 x Per Week/30 Days Discharge Instructions: Secure with ACE bandage as directed. Secured With: [employer], 4.5x3.1 (in/yd) (Generic) 3 x Per Week/30 Days Discharge Instructions: Secure with Kerlix as directed. Secured With: 40M Medipore Public affairs consultant Surgical T 2x10 (in/yd) (Generic) 3 x Per Week/30 Days ape Discharge Instructions: Secure with tape as directed. Add-Ons: foot cover (Generic) 3 x Per Week/30 Days Discharge Instructions: use foot covers [name], [name] ([id number]) [id number] Page 8 of 10 06/01/2022: The heel wound continues to contract and the undermining  continues to fill in. There is some slough and senescent skin accumulation. No concern for infection. I used a curette to debride slough, nonviable subcutaneous tissue, and some periwound senescent skin. We will continue to pack the undermined portion of the wound with iodoform packing strips and apply silver alginate to the exposed heel ulcer. He will follow-up in 2 weeks. Electronic Signature(s) Signed: 06/01/2022 3:06:14 PM By: [name] MD FACS Entered By: [name] on 06/01/2022 15:06:14 -------------------------------------------------------------------------------- HxROS Details Patient Name: Date of Service: [name],  [name] 06/01/2022 2:30 PM Medical Record Number: [id number] Patient Account Number: [id number] Date of Birth/Sex: Treating RN: July 25, 1951 (71 y.o. M) Primary Care Provider: [name] Other Clinician: Referring Provider: Treating Provider/Extender: Milagros Evener in Treatment: 25 Information Obtained From Patient Eyes Medical History: Positive for: Cataracts Hematologic/Lymphatic Medical History: Past Medical History Notes: hyperlipidemia Cardiovascular Medical History: Positive for: Hypertension Gastrointestinal Medical History: Positive for: Cirrhosis ; Hepatitis C - says it is gone Past Medical History Notes: colon polyps Genitourinary Medical History: Past Medical History Notes: BPH Musculoskeletal Medical History: Past Medical History Notes: arthritis Neurologic Medical History: Past Medical History Notes: stroke HBO Extended History Items EyesVIR, WHETSTINE ([id number]) [id number] Page 9 of 10 Cataracts Immunizations Pneumococcal Vaccine: Received Pneumococcal Vaccination: Yes Received Pneumococcal Vaccination On or After 60th Birthday: Yes Implantable Devices None Hospitalization / Surgery History Type of Hospitalization/Surgery inguinal hernia  repair knee surgery x4 Family and Social History Cancer: Yes - Mother,Father; Diabetes: Yes - Siblings; Heart Disease: Yes - Maternal Grandparents; Hereditary Spherocytosis: No; Hypertension: Yes - Mother; Kidney Disease: No; Lung Disease: Yes - Mother; Seizures: No; Stroke: Yes - Mother; Thyroid Problems: Yes - Siblings; Tuberculosis: No; Former smoker - ended on 09/25/1982; Marital Status - Single; Alcohol Use: Never; Drug Use: Prior History - cocaine, heroin, marijuana; Caffeine Use: Never; Financial Concerns: No; Food, Clothing or Shelter Needs: No; Support System Lacking: Yes; Transportation Concerns: No Electronic Signature(s) Signed: 06/01/2022 3:07:51 PM By: [name] MD FACS Entered By: [name] on 06/01/2022 15:04:51 -------------------------------------------------------------------------------- SuperBill Details Patient Name: Date of Service: [name], [name] 06/01/2022 Medical Record Number: [id number] Patient Account Number: [id number] Date of Birth/Sex: Treating RN: 1951-02-16 (71 y.o. M) Primary Care Provider: [name] Other Clinician: Referring Provider: Treating Provider/Extender: Milagros Evener in Treatment: 25 Diagnosis Coding ICD-10 Codes Code Description L89.610 Pressure ulcer of right heel, unstageable Z86.73 Personal history of transient ischemic attack (TIA), and cerebral infarction without residual deficits K74.60 Unspecified cirrhosis of liver B18.2 Chronic viral hepatitis C I63.9 Cerebral infarction, unspecified I10 Essential (primary) hypertension L97.512 Non-pressure chronic ulcer of other part of right foot with fat layer exposed Facility Procedures : CPT4 Code: [id number] Description: [place] - DEB SUBQ TISSUE 20 SQ CM/< ICD-10 Diagnosis Description L89.610 Pressure ulcer of right heel, unstageable Modifier: Quantity: 1 Physician Procedures : CPT4 Code Description Modifier [id number] - WC PHYS LEVEL  3 - EST PT 25 ICD-10 Diagnosis Description L89.610 Pressure ulcer of right heel, unstageable K74.60 Unspecified cirrhosis of liver Z86.73 Personal history of transient ischemic attack (TIA),  and cerebral infarction without residual deficits I10 Essential (primary) hypertension [name], Jarell ([id number]) [id number] Page 10 o [id number] 11042 - WC PHYS SUBQ TISS 20 SQ CM 1 ICD-10 Diagnosis Description L89.610  Pressure ulcer of right heel, unstageable Quantity: 1 f 10 Electronic Signature(s) Signed: 06/01/2022 3:06:45 PM By: [name] MD FACS Entered By: [name] on 06/01/2022 15:06:45

## 2022-06-01 NOTE — Progress Notes (Signed)
[place], [name] ([id number]) [id number] Page 1 of 8 Visit Report for 06/01/2022 Arrival Information Details Patient Name: Date of Service: [name] 06/01/2022 2:30 PM Medical Record Number: [id number] Patient Account Number: [id number] Date of Birth/Sex: Treating RN: [birth year] (71 y.o. [name] Primary Care Francena Zender: [name] Other Clinician: Referring Ticia Virgo: Treating Takira Sherrin/Extender: Milagros Evener in Treatment: 25 Visit Information History Since Last Visit Added or deleted any medications: No Patient Arrived: Wheel Chair Any new allergies or adverse reactions: No Arrival Time: 14:08 Had a fall or experienced change in No Accompanied By: sister activities of daily living that may affect Transfer Assistance: None risk of falls: Patient Identification Verified: Yes Signs or symptoms of abuse/neglect since last visito No Secondary Verification Process Completed: Yes Hospitalized since last visit: No Patient Requires Transmission-Based Precautions: No Implantable device outside of the clinic excluding No Patient Has Alerts: Yes cellular tissue based products placed in the center Patient Alerts: Patient on Blood Thinner since last visit: Pain Present Now: No Electronic Signature(s) Signed: 06/01/2022 4:16:20 PM By: Blanche East RN Entered By: Blanche East on 06/01/2022 14:08:39 -------------------------------------------------------------------------------- Encounter Discharge Information Details Patient Name: Date of Service: [name], [name] 06/01/2022 2:30 PM Medical Record Number: [id number] Patient Account Number: [id number] Date of Birth/Sex: Treating RN: Aug 15, 1950 (72 y.o. [name] Primary Care [name]: [name] Other Clinician: Referring [name]: Treating Ajay Strubel/Extender: Milagros Evener in Treatment: 25 Encounter Discharge  Information Items Post Procedure Vitals Discharge Condition: Stable Temperature (F): 97.9 Ambulatory Status: Wheelchair Pulse (bpm): 57 Discharge Destination: Home Respiratory Rate (breaths/min): 18 Transportation: Private Auto Blood Pressure (mmHg): 152/74 Accompanied By: sister Schedule Follow-up Appointment: Yes Clinical Summary of Care: Electronic Signature(s) Signed: 06/01/2022 4:16:20 PM By: Blanche East RN Entered By: Blanche East on 06/01/2022 16:10:31 [name] ([id number]) [id number] Page 2 of 8 -------------------------------------------------------------------------------- Lower Extremity Assessment Details Patient Name: Date of Service: [name] 06/01/2022 2:30 PM Medical Record Number: [id number] Patient Account Number: [id number] Date of Birth/Sex: Treating RN: 11-10-1950 (70 y.o. [name] Primary Care Catrinia Racicot: [name] Other Clinician: Referring Clarissia Mckeen: Treating [name]/Extender: Milagros Evener in Treatment: 25 Edema Assessment Assessed: Shirlyn Goltz: No] [name]: No] Edema: [Left: N] [Right: o] Calf Left: Right: Point of Measurement: From Medial Instep 32 cm Ankle Left: Right: Point of Measurement: From Medial Instep 21.5 cm Vascular Assessment Pulses: Dorsalis Pedis Palpable: [Right:Yes] Electronic Signature(s) Signed: 06/01/2022 4:16:20 PM By: Blanche East RN Entered By: Blanche East on 06/01/2022 14:17:07 -------------------------------------------------------------------------------- Multi Wound Chart Details Patient Name: Date of Service: [name], [name] 06/01/2022 2:30 PM Medical Record Number: [id number] Patient Account Number: [id number] Date of Birth/Sex: Treating RN: [birth year] (71 y.o. M) Primary Care [name]: [name] Other Clinician: Referring [name]: Treating [name]/Extender: Milagros Evener in Treatment:  25 Vital Signs Height(in): 69 Pulse(bpm): 48 Weight(lbs): 165 Blood Pressure(mmHg): 152/74 Body Mass Index(BMI): 24.4 Temperature(F): 97.9 Respiratory Rate(breaths/min): 18 [1:Photos:] [N/A:N/A] Right Calcaneus N/A N/A Wound Location: Pressure Injury N/A N/A Wounding Event: Pressure Ulcer N/A N/A Primary Etiology: Cataracts, Hypertension, Cirrhosis , N/A N/A Comorbid History: Hepatitis C 07/02/2020 N/A N/A Date Acquired: 25 N/A N/A Weeks of Treatment: Open N/A N/A Wound Status: No N/A N/A Wound Recurrence: 1.8x2x0.3 N/A N/A Measurements L x W x D (cm) 2.827 N/A N/A A (cm) : rea 0.848 N/A N/A Volume (cm) : 88.60% N/A N/A % Reduction in A  rea: 93.10% N/A N/A % Reduction in Volume: 3 Starting Position 1 (o'clock): 9 Ending Position 1 (o'clock): 1.8 Maximum Distance 1 (cm): Yes N/A N/A Undermining: Category/Stage IV N/A N/A Classification: Medium N/A N/A Exudate A mount: Serosanguineous N/A N/A Exudate Type: red, brown N/A N/A Exudate Color: Well defined, not attached N/A N/A Wound Margin: Medium (34-66%) N/A N/A Granulation A mount: Red, Pink N/A N/A Granulation Quality: Medium (34-66%) N/A N/A Necrotic A mount: Fat Layer (Subcutaneous Tissue): Yes N/A N/A Exposed Structures: Fascia: No Tendon: No Muscle: No Joint: No Bone: No Small (1-33%) N/A N/A Epithelialization: Debridement - Excisional N/A N/A Debridement: Pre-procedure Verification/Time Out 14:25 N/A N/A Taken: Subcutaneous, Slough N/A N/A Tissue Debrided: Skin/Subcutaneous Tissue N/A N/A Level: 3.6 N/A N/A Debridement A (sq cm): rea Curette N/A N/A Instrument: Minimum N/A N/A Bleeding: Pressure N/A N/A Hemostasis A chieved: 0 N/A N/A Procedural Pain: 0 N/A N/A Post Procedural Pain: Procedure was tolerated well N/A N/A Debridement Treatment Response: 1.8x2x0.3 N/A N/A Post Debridement Measurements L x W x D (cm) 0.848 N/A N/A Post Debridement Volume:  (cm) Category/Stage IV N/A N/A Post Debridement Stage: Callus: Yes N/A N/A Periwound Skin Texture: Excoriation: No Induration: No Crepitus: No Rash: No Scarring: No Maceration: Yes N/A N/A Periwound Skin Moisture: Dry/Scaly: No Atrophie Blanche: No N/A N/A Periwound Skin Color: Cyanosis: No Ecchymosis: No Erythema: No Hemosiderin Staining: No Mottled: No Pallor: No Rubor: No Debridement N/A N/A Procedures Performed: Treatment Notes Electronic Signature(s) Signed: 06/01/2022 3:03:58 PM By: [name] MD FACS Entered By: [name] on 06/01/2022 15:03:58 Hendricks [name] ([id number]) [id number] Page 4 of 8 -------------------------------------------------------------------------------- Multi-Disciplinary Care Plan Details Patient Name: Date of Service: [name] 06/01/2022 2:30 PM Medical Record Number: [id number] Patient Account Number: [id number] Date of Birth/Sex: Treating RN: 1951/02/05 (71 y.o. [name] Primary Care Davene Jobin: [name] Other Clinician: Referring Jolissa Kapral: Treating Gurjit Loconte/Extender: Milagros Evener in Treatment: 25 Active Inactive Abuse / Safety / Falls / Self Care Management Nursing Diagnoses: History of Falls Impaired physical mobility Potential for falls Potential for injury related to falls Goals: Patient will remain injury free related to falls Date Initiated: 12/08/2021 Date Inactivated: 01/13/2022 Target Resolution Date: 01/05/2022 Goal Status: Met Patient/caregiver will verbalize understanding of skin care regimen Date Initiated: 12/08/2021 Target Resolution Date: 07/01/2022 Goal Status: Active Interventions: Assess fall risk on admission and as needed Assess: immobility, friction, shearing, incontinence upon admission and as needed Assess impairment of mobility on admission and as needed per policy Provide education on fall  prevention Notes: Wound/Skin Impairment Nursing Diagnoses: Impaired tissue integrity Knowledge deficit related to ulceration/compromised skin integrity Goals: Patient/caregiver will verbalize understanding of skin care regimen Date Initiated: 12/08/2021 Target Resolution Date: 07/01/2022 Goal Status: Active Ulcer/skin breakdown will have a volume reduction of 30% by week 4 Date Initiated: 12/08/2021 Date Inactivated: 01/13/2022 Target Resolution Date: 01/05/2022 Goal Status: Met Interventions: Assess patient/caregiver ability to perform ulcer/skin care regimen upon admission and as needed Assess ulceration(s) every visit Provide education on ulcer and skin care Treatment Activities: Skin care regimen initiated : 12/08/2021 Topical wound management initiated : 12/08/2021 Notes: Electronic Signature(s) Signed: 06/01/2022 4:16:20 PM By: Blanche East RN Entered By: Blanche East on 06/01/2022 14:18:24 [place] [name] ([id number]) [id number] Page 5 of 8 -------------------------------------------------------------------------------- Pain Assessment Details Patient Name: Date of Service: [name] 06/01/2022 2:30 PM Medical Record Number: [id number] Patient Account Number: [id number] Date of Birth/Sex: Treating RN: 06-12-1951 (71 y.o. M)  Blanche East Primary Care Ora Bollig: [name] Other Clinician: Referring Larwence Tu: Treating Ayzia Day/Extender: Milagros Evener in Treatment: 25 Active Problems Location of Pain Severity and Description of Pain Patient Has Paino No Site Locations Rate the pain. Current Pain Level: 0 Pain Management and Medication Current Pain Management: Electronic Signature(s) Signed: 06/01/2022 4:16:20 PM By: Blanche East RN Entered By: Blanche East on 06/01/2022 14:15:09 -------------------------------------------------------------------------------- Patient/Caregiver Education  Details Patient Name: Date of Service: [name] N 10/31/2023andnbsp2:30 PM Medical Record Number: [id number] Patient Account Number: [id number] Date of Birth/Gender: Treating RN: 1950-09-08 (71 y.o. [name] Primary Care Physician: [name] Other Clinician: Referring Physician: Treating Physician/Extender: Milagros Evener in Treatment: 25 Education Assessment Education Provided To: Patient Education Topics Provided Safety: Methods: Explain/Verbal Responses: Reinforcements needed, State content correctly Wound/Skin Impairment: Methods: Explain/Verbal Responses: Reinforcements needed, State content correctly [place], [name] ([id number]) [id number] Page 6 of 8 Electronic Signature(s) Signed: 06/01/2022 4:16:20 PM By: Blanche East RN Entered By: Blanche East on 06/01/2022 14:21:10 -------------------------------------------------------------------------------- Wound Assessment Details Patient Name: Date of Service: [name], [name] 06/01/2022 2:30 PM Medical Record Number: [id number] Patient Account Number: [id number] Date of Birth/Sex: Treating RN: 01/26/1951 (71 y.o. [name] Primary Care Laurencia Roma: [name] Other Clinician: Referring Kaylee Trivett: Treating [facility]/Extender: Milagros Evener in Treatment: 25 Wound Status Wound Number: 1 Primary Etiology: Pressure Ulcer Wound Location: Right Calcaneus Wound Status: Open Wounding Event: Pressure Injury Comorbid History: Cataracts, Hypertension, Cirrhosis , Hepatitis C Date Acquired: 07/02/2020 Weeks Of Treatment: 25 Clustered Wound: No Photos Wound Measurements Length: (cm) 1.8 Width: (cm) 2 Depth: (cm) 0.3 Area: (cm) 2.827 Volume: (cm) 0.848 % Reduction in Area: 88.6% % Reduction in Volume: 93.1% Epithelialization: Small (1-33%) Tunneling: No Undermining: Yes Starting Position (o'clock): 3 Ending  Position (o'clock): 9 Maximum Distance: (cm) 1.8 Wound Description Classification: Category/Stage IV Wound Margin: Well defined, not attached Exudate Amount: Medium Exudate Type: Serosanguineous Exudate Color: red, brown Foul Odor After Cleansing: No Slough/Fibrino Yes Wound Bed Granulation Amount: Medium (34-66%) Exposed Structure Granulation Quality: Red, Pink Fascia Exposed: No Necrotic Amount: Medium (34-66%) Fat Layer (Subcutaneous Tissue) Exposed: Yes Necrotic Quality: Adherent Slough Tendon Exposed: No Muscle Exposed: No Joint Exposed: No Bone Exposed: No Periwound Skin Texture Texture Color No Abnormalities Noted: No No Abnormalities Noted: No Callus: Yes Atrophie BlancheSHANTI EICHEL, [name] ([id number]) [id number] Page 7 of 8 Crepitus: No Cyanosis: No Excoriation: No Ecchymosis: No Induration: No Erythema: No Rash: No Hemosiderin Staining: No Scarring: No Mottled: No Pallor: No Moisture Rubor: No No Abnormalities Noted: No Dry / Scaly: No Maceration: Yes Treatment Notes Wound #1 (Calcaneus) Wound Laterality: Right Cleanser Soap and Water Discharge Instruction: May shower and wash wound with dial antibacterial soap and water prior to dressing change. Wound Cleanser Discharge Instruction: Cleanse the wound with wound cleanser prior to applying a clean dressing using gauze sponges, not tissue or cotton balls. Peri-Wound Care Topical Primary Dressing KerraCel Ag Gelling Fiber Dressing, 4x5 in (silver alginate) Discharge Instruction: Apply silver alginate to wound bed as instructed Iodoform packing strip 1/4 (in) Discharge Instruction: Lightly pack as instructed Secondary Dressing ALLEVYN Heel 4 1/2in x 5 1/2in / 10.5cm x 13.5cm Discharge Instruction: Apply over primary dressing as directed. Woven Gauze Sponge, Non-Sterile 4x4 in Discharge Instruction: Apply over primary dressing as directed. Secured With Elastic  Bandage 4 inch (ACE bandage) Discharge Instruction: Secure with ACE bandage as directed. Kerlix Roll Sterile, 4.5x3.1 (in/yd) Discharge  Instruction: Secure with Kerlix as directed. 4M Medipore Soft Cloth Surgical T 2x10 (in/yd) ape Discharge Instruction: Secure with tape as directed. Compression Wrap Compression Stockings Add-Ons foot cover Discharge Instruction: use foot covers Electronic Signature(s) Signed: 06/01/2022 4:16:20 PM By: Blanche East RN Entered By: Blanche East on 06/01/2022 14:20:36 -------------------------------------------------------------------------------- Vitals Details Patient Name: Date of Service: [name], [name] 06/01/2022 2:30 PM Medical Record Number: [id number] Patient Account Number: [id number] Date of Birth/Sex: Treating RN: [birth year] (71 y.o. [name] Primary Care Anaika Santillano: [name] Other Clinician: Referring Lucia Mccreadie: Treating Shawonda Kerce/Extender: Milagros Evener in Treatment: [address], [place] ([id number]) [id number] Page 8 of 8 Vital Signs Time Taken: 14:00 Temperature (F): 97.9 Height (in): 69 Pulse (bpm): 57 Weight (lbs): 165 Respiratory Rate (breaths/min): 18 Body Mass Index (BMI): 24.4 Blood Pressure (mmHg): 152/74 Reference Range: 80 - 120 mg / dl Electronic Signature(s) Signed: 06/01/2022 4:16:20 PM By: Blanche East RN Entered By: Blanche East on 06/01/2022 14:14:59

## 2022-06-15 ENCOUNTER — Encounter (HOSPITAL_BASED_OUTPATIENT_CLINIC_OR_DEPARTMENT_OTHER): Payer: 59 | Admitting: General Surgery

## 2022-06-22 ENCOUNTER — Encounter (HOSPITAL_BASED_OUTPATIENT_CLINIC_OR_DEPARTMENT_OTHER): Payer: 59 | Attending: General Surgery | Admitting: General Surgery

## 2022-06-22 DIAGNOSIS — Z8673 Personal history of transient ischemic attack (TIA), and cerebral infarction without residual deficits: Secondary | ICD-10-CM | POA: Diagnosis not present

## 2022-06-22 DIAGNOSIS — L89610 Pressure ulcer of right heel, unstageable: Secondary | ICD-10-CM | POA: Diagnosis not present

## 2022-06-22 DIAGNOSIS — E785 Hyperlipidemia, unspecified: Secondary | ICD-10-CM | POA: Diagnosis not present

## 2022-06-22 DIAGNOSIS — I639 Cerebral infarction, unspecified: Secondary | ICD-10-CM | POA: Diagnosis not present

## 2022-06-22 DIAGNOSIS — Z87891 Personal history of nicotine dependence: Secondary | ICD-10-CM | POA: Insufficient documentation

## 2022-06-22 DIAGNOSIS — B182 Chronic viral hepatitis C: Secondary | ICD-10-CM | POA: Insufficient documentation

## 2022-06-22 DIAGNOSIS — L97512 Non-pressure chronic ulcer of other part of right foot with fat layer exposed: Secondary | ICD-10-CM | POA: Diagnosis not present

## 2022-06-22 DIAGNOSIS — I10 Essential (primary) hypertension: Secondary | ICD-10-CM | POA: Diagnosis not present

## 2022-06-22 DIAGNOSIS — K7460 Unspecified cirrhosis of liver: Secondary | ICD-10-CM | POA: Diagnosis not present

## 2022-06-22 NOTE — Progress Notes (Signed)
[place], [name] ([id number]) [id number] Page 1 of 8 Visit Report for 06/22/2022 Arrival Information Details Patient Name: Date of Service: [name] 06/22/2022 2:45 PM Medical Record Number: [id number] Patient Account Number: [id number] Date of Birth/Sex: Treating RN: January 21, 1951 (71 y.o. [name] Primary Care Dwayna Kentner: [name] Other Clinician: Referring Camaria Gerald: Treating Takyla Kuchera/Extender: Milagros Evener in Treatment: 28 Visit Information History Since Last Visit Added or deleted any medications: No Patient Arrived: Wheel Chair Any new allergies or adverse reactions: No Arrival Time: 14:52 Had a fall or experienced change in No Accompanied By: sister activities of daily living that may affect Transfer Assistance: None risk of falls: Patient Identification Verified: Yes Signs or symptoms of abuse/neglect since last visito No Secondary Verification Process Completed: Yes Hospitalized since last visit: No Patient Requires Transmission-Based Precautions: No Implantable device outside of the clinic excluding No Patient Has Alerts: Yes cellular tissue based products placed in the center Patient Alerts: Patient on Blood Thinner since last visit: Has Dressing in Place as Prescribed: Yes Pain Present Now: No Electronic Signature(s) Signed: 06/22/2022 3:44:28 PM By: Sharyn Creamer RN, BSN Entered By: Sharyn Creamer on 06/22/2022 15:40:28 -------------------------------------------------------------------------------- Encounter Discharge Information Details Patient Name: Date of Service: [name], [name] 06/22/2022 2:45 PM Medical Record Number: [id number] Patient Account Number: [id number] Date of Birth/Sex: Treating RN: 10-14-1950 (71 y.o. [name] Primary Care Gavin Faivre: [name] Other Clinician: Referring Nekeisha Aure: Treating Mar Zettler/Extender: Milagros Evener in Treatment: 28 Encounter Discharge Information Items Post Procedure Vitals Discharge Condition: Stable Temperature (F): 98.1 Ambulatory Status: Wheelchair Pulse (bpm): 59 Discharge Destination: Home Respiratory Rate (breaths/min): 18 Transportation: Private Auto Blood Pressure (mmHg): 147/73 Accompanied By: sister Schedule Follow-up Appointment: Yes Clinical Summary of Care: Patient Declined Electronic Signature(s) Signed: 06/22/2022 3:44:28 PM By: Sharyn Creamer RN, BSN Entered By: Sharyn Creamer on 06/22/2022 15:41:13 [name] ([id number]) [id number] Page 2 of 8 -------------------------------------------------------------------------------- Lower Extremity Assessment Details Patient Name: Date of Service: [name] 06/22/2022 2:45 PM Medical Record Number: [id number] Patient Account Number: [id number] Date of Birth/Sex: Treating RN: October 22, 1950 (71 y.o. [name] Primary Care Lazer Wollard: [name] Other Clinician: Referring Kyndahl Jablon: Treating Maycen Degregory/Extender: Milagros Evener in Treatment: 28 Edema Assessment Assessed: Shirlyn Goltz: No] [name]: No] Edema: [Left: N] [Right: o] Calf Left: Right: Point of Measurement: From Medial Instep 33 cm Ankle Left: Right: Point of Measurement: From Medial Instep 22.3 cm Vascular Assessment Pulses: Dorsalis Pedis Palpable: [Right:Yes] Electronic Signature(s) Signed: 06/22/2022 3:44:28 PM By: Sharyn Creamer RN, BSN Entered By: Sharyn Creamer on 06/22/2022 14:58:50 -------------------------------------------------------------------------------- Multi Wound Chart Details Patient Name: Date of Service: [name], [name] 06/22/2022 2:45 PM Medical Record Number: [id number] Patient Account Number: [id number] Date of Birth/Sex: Treating RN: 1951/03/06 (71 y.o. M) Primary Care Jessey Stehlin: [name] Other Clinician: Referring  Collyns Mcquigg: Treating Kylie Gros/Extender: Milagros Evener in Treatment: 28 Vital Signs Height(in): 69 Pulse(bpm): 63 Weight(lbs): 165 Blood Pressure(mmHg): 147/73 Body Mass Index(BMI): 24.4 Temperature(F): 98.1 Respiratory Rate(breaths/min): 18 [1:Photos:] [N/A:N/A] Right Calcaneus N/A N/A Wound Location: Pressure Injury N/A N/A Wounding Event: Pressure Ulcer N/A N/A Primary Etiology: Cataracts, Hypertension, Cirrhosis , N/A N/A Comorbid History: Hepatitis C 07/02/2020 N/A N/A Date Acquired: 1 N/A N/A Weeks of Treatment: Open N/A N/A Wound Status: No N/A N/A Wound Recurrence: 2x1x0.3 N/A N/A Measurements L x W x D (cm) 1.571 N/A N/A A (cm) : rea 0.471  N/A N/A Volume (cm) : 93.60% N/A N/A % Reduction in A rea: 96.20% N/A N/A % Reduction in Volume: Category/Stage IV N/A N/A Classification: Medium N/A N/A Exudate A mount: Serosanguineous N/A N/A Exudate Type: red, brown N/A N/A Exudate Color: Well defined, not attached N/A N/A Wound Margin: Medium (34-66%) N/A N/A Granulation A mount: Red, Pink N/A N/A Granulation Quality: Medium (34-66%) N/A N/A Necrotic A mount: Fat Layer (Subcutaneous Tissue): Yes N/A N/A Exposed Structures: Fascia: No Tendon: No Muscle: No Joint: No Bone: No Small (1-33%) N/A N/A Epithelialization: Debridement - Selective/Open Wound N/A N/A Debridement: Pre-procedure Verification/Time Out 15:10 N/A N/A Taken: Lidocaine 4% T opical Solution N/A N/A Pain Control: Callus, Slough N/A N/A Tissue Debrided: Non-Viable Tissue N/A N/A Level: 2 N/A N/A Debridement A (sq cm): rea Curette N/A N/A Instrument: Minimum N/A N/A Bleeding: Pressure N/A N/A Hemostasis A chieved: 0 N/A N/A Procedural Pain: 0 N/A N/A Post Procedural Pain: Procedure was tolerated well N/A N/A Debridement Treatment Response: 2x1x0.3 N/A N/A Post Debridement Measurements L x W x D (cm) 0.471 N/A N/A Post Debridement Volume:  (cm) Category/Stage IV N/A N/A Post Debridement Stage: Callus: Yes N/A N/A Periwound Skin Texture: Excoriation: No Induration: No Crepitus: No Rash: No Scarring: No Maceration: Yes N/A N/A Periwound Skin Moisture: Dry/Scaly: No Atrophie Blanche: No N/A N/A Periwound Skin Color: Cyanosis: No Ecchymosis: No Erythema: No Hemosiderin Staining: No Mottled: No Pallor: No Rubor: No Debridement N/A N/A Procedures Performed: Treatment Notes Electronic Signature(s) Signed: 06/22/2022 3:32:23 PM By: [name] MD FACS Entered By: [name] on 06/22/2022 15:32:23 -------------------------------------------------------------------------------- Multi-Disciplinary Care Plan Details Patient Name: Date of Service: [name], [name] 06/22/2022 2:45 PM Medical Record Number: [id number] Patient Account Number: [id number] CLOUD, GRAHAM ([id number]) [id number].pdf Page 4 of 8 Date of Birth/Sex: Treating RN: [birth year] (72 y.o. [name] Primary Care Hughey Rittenberry: [name] Other Clinician: Referring Lauryl Seyer: Treating Fahd Galea/Extender: Milagros Evener in Treatment: 28 Active Inactive Abuse / Safety / Falls / Self Care Management Nursing Diagnoses: History of Falls Impaired physical mobility Potential for falls Potential for injury related to falls Goals: Patient will remain injury free related to falls Date Initiated: 12/08/2021 Date Inactivated: 01/13/2022 Target Resolution Date: 01/05/2022 Goal Status: Met Patient/caregiver will verbalize understanding of skin care regimen Date Initiated: 12/08/2021 Target Resolution Date: 07/01/2022 Goal Status: Active Interventions: Assess fall risk on admission and as needed Assess: immobility, friction, shearing, incontinence upon admission and as needed Assess impairment of mobility on admission and as needed per policy Provide education on fall  prevention Notes: Wound/Skin Impairment Nursing Diagnoses: Impaired tissue integrity Knowledge deficit related to ulceration/compromised skin integrity Goals: Patient/caregiver will verbalize understanding of skin care regimen Date Initiated: 12/08/2021 Target Resolution Date: 07/01/2022 Goal Status: Active Ulcer/skin breakdown will have a volume reduction of 30% by week 4 Date Initiated: 12/08/2021 Date Inactivated: 01/13/2022 Target Resolution Date: 01/05/2022 Goal Status: Met Interventions: Assess patient/caregiver ability to perform ulcer/skin care regimen upon admission and as needed Assess ulceration(s) every visit Provide education on ulcer and skin care Treatment Activities: Skin care regimen initiated : 12/08/2021 Topical wound management initiated : 12/08/2021 Notes: Electronic Signature(s) Signed: 06/22/2022 3:44:28 PM By: Sharyn Creamer RN, BSN Entered By: Sharyn Creamer on 06/22/2022 15:03:46 -------------------------------------------------------------------------------- Pain Assessment Details Patient Name: Date of Service: [name], [name] 06/22/2022 2:45 PM Medical Record Number: [id number] Patient Account Number: [id number] Date of Birth/Sex: Treating RN: 06/28/1951 (71 y.o. M) Palmer, Therese Sarah, Brayan (  [id number]) [id number] Page 5 of 8 Primary Care Dulcy Sida: [name] Other Clinician: Referring Dayonna Selbe: Treating Yang Rack/Extender: Milagros Evener in Treatment: 28 Active Problems Location of Pain Severity and Description of Pain Patient Has Paino No Site Locations Pain Management and Medication Current Pain Management: Electronic Signature(s) Signed: 06/22/2022 3:44:28 PM By: Sharyn Creamer RN, BSN Entered By: Sharyn Creamer on 06/22/2022 14:53:31 -------------------------------------------------------------------------------- Patient/Caregiver Education Details Patient Name: Date of  Service: [name] N 11/21/2023andnbsp2:45 PM Medical Record Number: [id number] Patient Account Number: [id number] Date of Birth/Gender: Treating RN: [birth year] (71 y.o. [name] Primary Care Physician: [name] Other Clinician: Referring Physician: Treating Physician/Extender: Milagros Evener in Treatment: 28 Education Assessment Education Provided To: Patient Education Topics Provided Wound/Skin Impairment: Methods: Explain/Verbal Responses: State content correctly [employer]) Signed: 06/22/2022 3:44:28 PM By: Sharyn Creamer RN, BSN Entered By: Sharyn Creamer on 06/22/2022 15:04:[address] [place] [address] [name] ([id number]) [id number] Page 6 of 8 -------------------------------------------------------------------------------- Wound Assessment Details Patient Name: Date of Service: [name] 06/22/2022 2:45 PM Medical Record Number: [id number] Patient Account Number: [id number] Date of Birth/Sex: Treating RN: Dec 22, 1950 (71 y.o. [name] Primary Care Miloh Alcocer: [name] Other Clinician: Referring Saman Giddens: Treating Tashianna Broome/Extender: Milagros Evener in Treatment: 28 Wound Status Wound Number: 1 Primary Etiology: Pressure Ulcer Wound Location: Right Calcaneus Wound Status: Open Wounding Event: Pressure Injury Comorbid History: Cataracts, Hypertension, Cirrhosis , Hepatitis C Date Acquired: 07/02/2020 Weeks Of Treatment: 28 Clustered Wound: No Photos Wound Measurements Length: (cm) 2 Width: (cm) 1 Depth: (cm) 0.3 Area: (cm) 1. Volume: (cm) 0. % Reduction in Area: 93.6% % Reduction in Volume: 96.2% Epithelialization: Small (1-33%) 571 Tunneling: No 471 Undermining: No Wound Description Classification: Category/Stage IV Wound Margin: Well defined, not attached Exudate Amount: Medium Exudate Type: Serosanguineous Exudate Color: red,  brown Foul Odor After Cleansing: No Slough/Fibrino Yes Wound Bed Granulation Amount: Medium (34-66%) Exposed Structure Granulation Quality: Red, Pink Fascia Exposed: No Necrotic Amount: Medium (34-66%) Fat Layer (Subcutaneous Tissue) Exposed: Yes Necrotic Quality: Adherent Slough Tendon Exposed: No Muscle Exposed: No Joint Exposed: No Bone Exposed: No Periwound Skin Texture Texture Color No Abnormalities Noted: No No Abnormalities Noted: No Callus: Yes Atrophie Blanche: No Crepitus: No Cyanosis: No Excoriation: No Ecchymosis: No Induration: No Erythema: No Rash: No Hemosiderin Staining: No Scarring: No Mottled: No Pallor: No Moisture Rubor: No No Abnormalities Noted: No Dry / Scaly: No MacerationTJAY VELAZQUEZ, [name] ([id number]) [id number] Page 7 of 8 Treatment Notes Wound #1 (Calcaneus) Wound Laterality: Right Cleanser Soap and Water Discharge Instruction: May shower and wash wound with dial antibacterial soap and water prior to dressing change. Wound Cleanser Discharge Instruction: Cleanse the wound with wound cleanser prior to applying a clean dressing using gauze sponges, not tissue or cotton balls. Peri-Wound Care Topical Primary Dressing KerraCel Ag Gelling Fiber Dressing, 4x5 in (silver alginate) Discharge Instruction: Apply silver alginate to wound bed as instructed Iodoform packing strip 1/4 (in) Discharge Instruction: Lightly pack as instructed Secondary Dressing ALLEVYN Heel 4 1/2in x 5 1/2in / 10.5cm x 13.5cm Discharge Instruction: Apply over primary dressing as directed. Woven Gauze Sponge, Non-Sterile 4x4 in Discharge Instruction: Apply over primary dressing as directed. Secured With Elastic Bandage 4 inch (ACE bandage) Discharge Instruction: Secure with ACE bandage as directed. Kerlix Roll Sterile, 4.5x3.1 (in/yd) Discharge Instruction: Secure with Kerlix as directed. 13M Medipore Soft Cloth Surgical T 2x10  (in/yd) ape Discharge Instruction: Secure with tape as directed.  Compression Wrap Compression Stockings Add-Ons foot cover Discharge Instruction: use foot covers Electronic Signature(s) Signed: 06/22/2022 3:44:28 PM By: Sharyn Creamer RN, BSN Entered By: Sharyn Creamer on 06/22/2022 15:03:06 -------------------------------------------------------------------------------- [place] Details Patient Name: Date of Service: [name], [name] 06/22/2022 2:45 PM Medical Record Number: [id number] Patient Account Number: [id number] Date of Birth/Sex: Treating RN: 1950-12-20 (71 y.o. [name] Primary Care Isbella Arline: [name] Other Clinician: Referring Carletta Feasel: Treating Colton Engdahl/Extender: Milagros Evener in Treatment: 28 Vital Signs Time Taken: 14:52 Temperature (F): 98.1 Height (in): 69 Pulse (bpm): 59 Weight (lbs): 165 Respiratory Rate (breaths/min): 18 Body Mass Index (BMI): 24.4 Blood Pressure (mmHg): 147/73 Reference Range: 80 - 120 mg / dl Electronic Signature(s) [name], [name] ([id number]) [id number] Page 8 of 8 Signed: 06/22/2022 3:44:28 PM By: Sharyn Creamer RN, BSN Entered By: Sharyn Creamer on 06/22/2022 14:53:24

## 2022-06-22 NOTE — Progress Notes (Signed)
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 There was a Selective/Open Wound Non-Viable Tissue Debridement with a total area of 2 sq cm performed by [name], MD. With the following instrument(s): Curette to remove Non-Viable tissue/material. Material removed includes Callus and Slough and after achieving pain control using Lidocaine 4% T opical Solution. No specimens were taken. A time out was conducted at 15:10, prior to the start of the procedure. A Minimum amount of bleeding was controlled with Pressure. The procedure was tolerated well with a pain level of 0 throughout and a pain level of 0 following the procedure. Post Debridement Measurements: 2cm length x 1cm width x 0.3cm depth; 0.471cm^3 volume. Post debridement Stage noted as Category/Stage IV. Character of Wound/Ulcer Post Debridement is improved. Post procedure Diagnosis Wound  #1: Same as Pre-Procedure General Notes: scribed for Dr Celine Ahr by Sharyn Creamer, RN. Plan Follow-up Appointments: Return Appointment in 2 weeks. - Dr. Celine Ahr Room 3 Bathing/ Shower/ Hygiene: May shower and wash wound with soap and water. - with dressing changes Home Health: New wound care orders this week; continue Home Health for wound care. May utilize formulary equivalent dressing for wound treatment orders unless otherwise specified. - iodoform Packing strips to undermining area of wound, cover with silver alginate, gauze, heel cup and kerlix silver alginate and gauze to toe Dressing changes to be completed by [place] on Monday / Wednesday / Friday except when patient has scheduled visit at [facility]. Other Home Health Orders/Instructions: - [facility] 3x a week The following medication(s) was prescribed: lidocaine topical 4 % cream cream topical once daily for prior to debridement was prescribed at facility WOUND #1: - Calcaneus Wound Laterality: Right Cleanser: Soap and Water 3 x Per Week/30 Days [name], [name] ([id number]) [id number] Page 8 of 10 Discharge Instructions: May shower and wash wound with dial antibacterial soap and water prior to dressing change. Cleanser: Wound Cleanser 3 x Per Week/30 Days Discharge Instructions: Cleanse the wound with wound cleanser prior to applying a clean dressing using gauze sponges, not tissue or cotton balls. Prim Dressing: KerraCel Ag Gelling Fiber Dressing, 4x5 in (silver alginate) 3 x Per Week/30 Days ary Discharge Instructions: Apply silver alginate to wound bed as instructed Prim Dressing: Iodoform packing strip 1/4 (in) 3 x Per Week/30 Days ary Discharge Instructions: Lightly pack as instructed Secondary Dressing: ALLEVYN Heel 4 1/2in x 5 1/2in / 10.5cm x 13.5cm (Generic) 3 x Per Week/30 Days Discharge Instructions: Apply over primary dressing as directed. Secondary Dressing: Woven Gauze  Sponge, Non-Sterile 4x4 in 3 x Per Week/30 Days Discharge Instructions: Apply over primary dressing as directed. Secured With: Elastic Bandage 4 inch (ACE bandage) (Generic) 3 x Per Week/30 Days Discharge Instructions: Secure with ACE bandage as directed. Secured With: [employer], 4.5x3.1 (in/yd) (Generic) 3 x Per Week/30 Days Discharge Instructions: Secure with Kerlix as directed. Secured With: 45M Medipore Public affairs consultant Surgical T 2x10 (in/yd) (Generic) 3 x Per Week/30 Days ape Discharge Instructions: Secure with tape  as directed. Add-Ons: foot cover (Generic) 3 x Per Week/30 Days Discharge Instructions: use foot covers 06/22/2022: The wound is smaller again today and the undermining has contracted further. There is slough and eschar as well as some periwound callus buildup. No concern for infection. I used a curette to debride the callus and slough from his wound. We will continue to pack the undermined portion with iodoform packing strips and apply silver alginate to the flat exposed surface. Continue offloading and padding of the heel. Follow-up in 2 weeks. Electronic Signature(s) Signed: 06/22/2022 3:39:37 PM By: [name] MD FACS Entered By: [name] on 06/22/2022 15:39:37 -------------------------------------------------------------------------------- HxROS Details Patient Name: Date of Service: [name], [name] 06/22/2022 2:45 PM Medical Record Number: [id number] Patient Account Number: [id number] Date of Birth/Sex: Treating RN: August 31, 1950 (71 y.o. M) Primary Care Provider: [name] Other Clinician: Referring Provider: Treating Provider/Extender: Milagros Evener in Treatment: 28 Information Obtained From Patient Eyes Medical History: Positive for: Cataracts Hematologic/Lymphatic Medical History: Past Medical History Notes: hyperlipidemia Cardiovascular Medical History: Positive for:  Hypertension Gastrointestinal Medical History: Positive for: Cirrhosis ; Hepatitis C - says it is gone Past Medical History Notes: colon polyps Genitourinary Medical History: Gaines, [name] ([id number]) [id number] Page 9 of 10 Past Medical History Notes: BPH Musculoskeletal Medical History: Past Medical History Notes: arthritis Neurologic Medical History: Past Medical History Notes: stroke HBO Extended History Items Eyes: Cataracts Immunizations Pneumococcal Vaccine: Received Pneumococcal Vaccination: Yes Received Pneumococcal Vaccination On or After 60th Birthday: Yes Implantable Devices None Hospitalization / Surgery History Type of Hospitalization/Surgery inguinal hernia repair knee surgery x4 Family and Social History Cancer: Yes - Mother,Father; Diabetes: Yes - Siblings; Heart Disease: Yes - Maternal Grandparents; Hereditary Spherocytosis: No; Hypertension: Yes - Mother; Kidney Disease: No; Lung Disease: Yes - Mother; Seizures: No; Stroke: Yes - Mother; Thyroid Problems: Yes - Siblings; Tuberculosis: No; Former smoker - ended on 09/25/1982; Marital Status - Single; Alcohol Use: Never; Drug Use: Prior History - cocaine, heroin, marijuana; Caffeine Use: Never; Financial Concerns: No; Food, Clothing or Shelter Needs: No; Support System Lacking: Yes; Transportation Concerns: No Electronic Signature(s) Signed: 06/22/2022 3:41:18 PM By: [name] MD FACS Entered By: [name] on 06/22/2022 15:36:24 -------------------------------------------------------------------------------- SuperBill Details Patient Name: Date of Service: [name], [name] 06/22/2022 Medical Record Number: [id number] Patient Account Number: [id number] Date of Birth/Sex: Treating RN: 17-Oct-1950 (71 y.o. M) Primary Care Provider: [name] Other Clinician: Referring Provider: Treating Provider/Extender: Milagros Evener in  Treatment: 28 Diagnosis Coding ICD-10 Codes Code Description L89.610 Pressure ulcer of right heel, unstageable Z86.73 Personal history of transient ischemic attack (TIA), and cerebral infarction without residual deficits K74.60 Unspecified cirrhosis of liver B18.2 Chronic viral hepatitis C I63.9 Cerebral infarction, unspecified I10 Essential (primary) hypertension L97.512 Non-pressure chronic ulcer of other part of right foot with fat layer exposed [name], Shaughn ([id number]) [id number] Page 10 of 10 Facility Procedures : CPT4 Code: [id number] Description: [phone] - DEBRIDE WOUND 1ST 20 SQ CM OR < ICD-10 Diagnosis Description L89.610 Pressure ulcer of right heel, unstageable Modifier: Quantity: 1 Physician Procedures : CPT4 Code Description Modifier [id number] - WC PHYS LEVEL 4 - EST PT 25 ICD-10 Diagnosis Description L89.610 Pressure ulcer of right heel, unstageable K74.60 Unspecified cirrhosis of liver I63.9 Cerebral infarction, unspecified I10 Essential  (primary) hypertension Quantity: 1 : [id number] - WC PHYS DEBR WO ANESTH 20 SQ CM ICD-10 Diagnosis Description L89.610 Pressure ulcer of right heel, unstageable Quantity:  1 Electronic Signature(s) Signed: 06/22/2022 3:40:15 PM By: [name] MD FACS Entered By: [name] on 06/22/2022 15:40:14

## 2022-07-06 ENCOUNTER — Encounter (HOSPITAL_BASED_OUTPATIENT_CLINIC_OR_DEPARTMENT_OTHER): Payer: 59 | Attending: General Surgery | Admitting: General Surgery

## 2022-07-06 DIAGNOSIS — L97512 Non-pressure chronic ulcer of other part of right foot with fat layer exposed: Secondary | ICD-10-CM | POA: Diagnosis not present

## 2022-07-06 DIAGNOSIS — I10 Essential (primary) hypertension: Secondary | ICD-10-CM | POA: Diagnosis not present

## 2022-07-06 DIAGNOSIS — L89610 Pressure ulcer of right heel, unstageable: Secondary | ICD-10-CM | POA: Diagnosis not present

## 2022-07-06 DIAGNOSIS — K7460 Unspecified cirrhosis of liver: Secondary | ICD-10-CM | POA: Diagnosis not present

## 2022-07-06 DIAGNOSIS — B182 Chronic viral hepatitis C: Secondary | ICD-10-CM | POA: Insufficient documentation

## 2022-07-07 NOTE — Progress Notes (Signed)
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 There was a Excisional Skin/Subcutaneous Tissue Debridement with a total area of 2.34 sq cm performed by [name], MD. With the following instrument(s): Curette to remove Non-Viable tissue/material. Material removed includes Eschar, Subcutaneous Tissue, and Slough after achieving pain control using Lidocaine 5% topical ointment. No specimens were taken. A time out was conducted at 14:35, prior to the start of the procedure. A Minimum amount of bleeding was controlled with Pressure. The procedure was tolerated well with a pain level of 0 throughout and a pain level of 0 following the procedure. Post Debridement Measurements: 1.3cm length x 1.8cm width x 1.3cm depth; 2.389cm^3 volume. Post debridement Stage noted as Category/Stage IV. Character of Wound/Ulcer Post  Debridement is improved. Post procedure Diagnosis Wound #1: Same as Pre-Procedure General Notes: Scribed for Dr. Celine Ahr by J.Scotton. Plan Follow-up Appointments: Return Appointment in 2 weeks. - Dr. Celine Ahr Room 3 Bathing/ Shower/ Hygiene: May shower and wash wound with soap and water. - with dressing changes Home Health: New wound care orders this week; continue Home Health for wound care. May utilize formulary equivalent dressing for wound treatment orders unless otherwise specified. - iodoform Packing strips to undermining area of wound, cover with silver alginate, gauze, heel cup and kerlix silver alginate and gauze to toe Dressing changes to be completed by [place] on Monday / Wednesday / Friday except when patient has scheduled visit at [facility]. Other Home Health Orders/Instructions: - [facility] 3x a week WOUND #1: - Calcaneus Wound Laterality: Right Cleanser: Soap and Water 3 x Per Week/30 Days Discharge Instructions: May shower and wash wound with dial antibacterial soap and water prior to dressing change. Cleanser: Wound Cleanser 3 x Per Week/30 Days Discharge Instructions: Cleanse the wound with wound cleanser prior to applying a clean dressing using gauze sponges, not tissue or cotton balls. Prim Dressing: Iodoform packing strip 1/4 (in) 3 x Per Week/30 Days ary Discharge Instructions: Lightly pack as instructed Prim Dressing: KerraCel Ag Gelling Fiber Dressing, 4x5 in (silver alginate) 3 x Per Week/30 Days ary Discharge Instructions: Apply silver alginate to wound bed as instructed [name], [name] ([id number]) [id number] Page 8 of 10 Secondary Dressing: ALLEVYN Heel 4 1/2in x 5 1/2in / 10.5cm x 13.5cm (Generic) 3 x Per Week/30 Days Discharge Instructions: Apply over primary dressing as directed. Secondary Dressing: Woven Gauze Sponge, Non-Sterile 4x4 in 3 x Per Week/30 Days Discharge Instructions: Apply over primary dressing as  directed. Secured With: Elastic Bandage 4 inch (ACE bandage) (Generic) 3 x Per Week/30 Days Discharge Instructions: Secure with ACE bandage as directed. Secured With: [employer], 4.5x3.1 (in/yd) (Generic) 3 x Per Week/30 Days Discharge Instructions: Secure with Kerlix as directed. Secured With: 42M Medipore Public affairs consultant Surgical T 2x10 (in/yd) (Generic) 3 x Per Week/30 Days ape Discharge Instructions: Secure with tape as  directed. Add-Ons: foot cover (Generic) 3 x Per Week/30 Days Discharge Instructions: use foot covers 07/06/2022: The undermining continues to close then and now measures 0.8 cm. There is some periwound eschar and a little bit of slough on the wound surface. I used a curette to debride eschar, nonviable subcutaneous tissue, and slough from the patient's wound. We will continue to pack the undermined portion of the wound with iodoform packing strips and apply silver alginate to the open surface of the wound. Continue to offload and ensure adequate protein intake. Follow-up in 2 weeks. Electronic Signature(s) Signed: 07/06/2022 2:46:08 PM By: [name] MD FACS Entered By: [name] on 07/06/2022 14:46:08 -------------------------------------------------------------------------------- HxROS Details Patient Name: Date of Service: [name], [name] 07/06/2022 2:30 PM Medical Record Number: [id number] Patient Account Number: [id number] Date of Birth/Sex: Treating RN: 1951-05-20 (72 y.o. M) Primary Care Provider: [name] Other Clinician: Referring Provider: Treating Provider/Extender: Milagros Evener in Treatment: 30 Information Obtained From Patient Eyes Medical History: Positive for: Cataracts Hematologic/Lymphatic Medical History: Past Medical History Notes: hyperlipidemia Cardiovascular Medical History: Positive for: Hypertension Gastrointestinal Medical History: Positive for: Cirrhosis ; Hepatitis C - says it  is gone Past Medical History Notes: colon polyps Genitourinary Medical History: Past Medical History Notes: BPH Musculoskeletal Medical History: [name], [name] ([id number]) [id number] Page 9 of 10 Past Medical History Notes: arthritis Neurologic Medical History: Past Medical History Notes: stroke HBO Extended History Items Eyes: Cataracts Immunizations Pneumococcal Vaccine: Received Pneumococcal Vaccination: Yes Received Pneumococcal Vaccination On or After 60th Birthday: Yes Implantable Devices None Hospitalization / Surgery History Type of Hospitalization/Surgery inguinal hernia repair knee surgery x4 Family and Social History Cancer: Yes - Mother,Father; Diabetes: Yes - Siblings; Heart Disease: Yes - Maternal Grandparents; Hereditary Spherocytosis: No; Hypertension: Yes - Mother; Kidney Disease: No; Lung Disease: Yes - Mother; Seizures: No; Stroke: Yes - Mother; Thyroid Problems: Yes - Siblings; Tuberculosis: No; Former smoker - ended on 09/25/1982; Marital Status - Single; Alcohol Use: Never; Drug Use: Prior History - cocaine, heroin, marijuana; Caffeine Use: Never; Financial Concerns: No; Food, Clothing or Shelter Needs: No; Support System Lacking: Yes; Transportation Concerns: No Electronic Signature(s) Signed: 07/06/2022 2:46:51 PM By: [name] MD FACS Entered By: [name] on 07/06/2022 14:41:40 -------------------------------------------------------------------------------- SuperBill Details Patient Name: Date of Service: [name], [name] 07/06/2022 Medical Record Number: [id number] Patient Account Number: [id number] Date of Birth/Sex: Treating RN: April 24, 1951 (71 y.o. M) Primary Care Provider: [name] Other Clinician: Referring Provider: Treating Provider/Extender: Milagros Evener in Treatment: 30 Diagnosis Coding ICD-10 Codes Code Description L89.610 Pressure ulcer of right  heel, unstageable Z86.73 Personal history of transient ischemic attack (TIA), and cerebral infarction without residual deficits K74.60 Unspecified cirrhosis of liver B18.2 Chronic viral hepatitis C I63.9 Cerebral infarction, unspecified I10 Essential (primary) hypertension L97.512 Non-pressure chronic ulcer of other part of right foot with fat layer exposed Facility Procedures : [facility] CPT4 Code: [id number] LEONCIO, HANSEN ([id number]) Description: [phone] - DEB SUBQ TISSUE 20 SQ CM/< ICD-10 Diagnosis Description L89.610 Pressure ulcer of right heel, unstageable [id number] Modifier: [phone] Quantity: 1 .pdf Page 10 of 10 Physician Procedures : CPT4 Code Description Modifier [id number] - WC PHYS LEVEL 4 - EST PT 25 ICD-10 Diagnosis Description L89.610 Pressure ulcer of right heel, unstageable Z86.73 Personal history of transient ischemic attack (TIA), and cerebral infarction without  residual deficits K74.60 Unspecified cirrhosis of liver I10 Essential (primary) hypertension Quantity: 1 : [phone] 11042 - WC PHYS SUBQ  TISS 20 SQ CM ICD-10 Diagnosis Description L89.610 Pressure ulcer of right heel, unstageable Quantity: 1 Electronic Signature(s) Signed: 07/06/2022 2:46:33 PM By: [name] MD FACS Entered By: [name] on 07/06/2022 14:46:33

## 2022-07-07 NOTE — Progress Notes (Signed)
[place], [name] ([id number]) [id number] Page 1 of 8 Visit Report for 07/06/2022 Arrival Information Details Patient Name: Date of Service: [name] 07/06/2022 2:30 PM Medical Record Number: [id number] Patient Account Number: [id number] Date of Birth/Sex: Treating RN: 1951/06/27 (71 y.o. M) Primary Care Zipporah [name]: [name] Other Clinician: Referring [name]: Treating [name]/Extender: [name] in Treatment: 25 Visit Information History Since Last Visit All ordered tests and consults were completed: No Patient Arrived: Wheel Chair Added or deleted any medications: No Arrival Time: 14:09 Any new allergies or adverse reactions: No Accompanied By: sister Had a fall or experienced change in No Transfer Assistance: None activities of daily living that may affect Patient Identification Verified: Yes risk of falls: Secondary Verification Process Completed: Yes Signs or symptoms of abuse/neglect since last visito No Patient Requires Transmission-Based Precautions: No Hospitalized since last visit: No Patient Has Alerts: Yes Implantable device outside of the clinic excluding No Patient Alerts: Patient on Blood Thinner cellular tissue based products placed in the center since last visit: Pain Present Now: No Electronic Signature(s) Signed: 07/06/2022 3:19:49 PM By: Worthy Rancher Entered By: Worthy Rancher on 07/06/2022 14:09:36 -------------------------------------------------------------------------------- Encounter Discharge Information Details Patient Name: Date of Service: [name], [place] [name] 07/06/2022 2:30 PM Medical Record Number: [id number] Patient Account Number: [id number] Date of Birth/Sex: Treating RN: Jul 21, 1951 (71 y.o. [name] Primary Care Harace Mccluney: [name] Other Clinician: Dellie Catholic Referring [name]: Treating Maven [name]/Extender: [name]  in Treatment: 30 Encounter Discharge Information Items Post Procedure Vitals Discharge Condition: Stable Temperature (F): 98.5 Ambulatory Status: Wheelchair Pulse (bpm): 70 Discharge Destination: Home Respiratory Rate (breaths/min): 20 Transportation: Private Auto Blood Pressure (mmHg): 134/73 Accompanied By: sister Schedule Follow-up Appointment: Yes Clinical Summary of Care: Patient Declined Electronic Signature(s) Signed: 07/06/2022 5:09:37 PM By: Dellie Catholic RN Entered By: Dellie Catholic on 07/06/2022 16:55:58 [place] [name] ([id number]) [id number] Page 2 of 8 -------------------------------------------------------------------------------- Lower Extremity Assessment Details Patient Name: Date of Service: [name] 07/06/2022 2:30 PM Medical Record Number: [id number] Patient Account Number: [id number] Date of Birth/Sex: Treating RN: 09-26-1950 (71 y.o. [name] Primary Care Nikolus Marczak: [name] Other Clinician: Referring Loren Vicens: Treating Jaila Schellhorn/Extender: [name] in Treatment: 30 Edema Assessment Assessed: Shirlyn Goltz: No] [name]: No] Edema: [Left: N] [Right: o] Calf Left: Right: Point of Measurement: From Medial Instep 33.9 cm Ankle Left: Right: Point of Measurement: From Medial Instep 222.9 cm Vascular Assessment Pulses: Dorsalis Pedis Palpable: [Right:Yes] Electronic Signature(s) Signed: 07/06/2022 5:09:37 PM By: Dellie Catholic RN Entered By: Dellie Catholic on 07/06/2022 14:22:22 -------------------------------------------------------------------------------- Multi Wound Chart Details Patient Name: Date of Service: [name], [name] 07/06/2022 2:30 PM Medical Record Number: [id number] Patient Account Number: [id number] Date of Birth/Sex: Treating RN: 1950-08-12 (71 y.o. M) Primary Care [name]: [name] Other Clinician: Referring [name]: Treating  [name]/Extender: [name] in Treatment: 30 Vital Signs Height(in): 5 Pulse(bpm): 70 Weight(lbs): 165 Blood Pressure(mmHg): 134/73 Body Mass Index(BMI): 24.4 Temperature(F): 98.5 Respiratory Rate(breaths/min): 20 [1:Photos:] [N/A:N/A] Right Calcaneus N/A N/A Wound Location: Pressure Injury N/A N/A Wounding Event: Pressure Ulcer N/A N/A Primary Etiology: Cataracts, Hypertension, Cirrhosis , N/A N/A Comorbid History: Hepatitis C 07/02/2020 N/A N/A Date Acquired: 30 N/A N/A Weeks of Treatment: Open N/A N/A Wound Status: No N/A N/A Wound Recurrence: 1.3x1.8x1.3 N/A N/A Measurements L x W x D (cm) 1.838 N/A N/A A (cm) : rea 2.389 N/A N/A Volume (  cm) : 92.60% N/A N/A % Reduction in A rea: 80.70% N/A N/A % Reduction in Volume: 3 Starting Position 1 (o'clock): 9 Ending Position 1 (o'clock): 0.8 Maximum Distance 1 (cm): Yes N/A N/A Undermining: Category/Stage IV N/A N/A Classification: Medium N/A N/A Exudate A mount: Serosanguineous N/A N/A Exudate Type: red, brown N/A N/A Exudate Color: Well defined, not attached N/A N/A Wound Margin: Medium (34-66%) N/A N/A Granulation A mount: Red, Pink N/A N/A Granulation Quality: Medium (34-66%) N/A N/A Necrotic A mount: Fat Layer (Subcutaneous Tissue): Yes N/A N/A Exposed Structures: Fascia: No Tendon: No Muscle: No Joint: No Bone: No Small (1-33%) N/A N/A Epithelialization: Debridement - Excisional N/A N/A Debridement: Pre-procedure Verification/Time Out 14:35 N/A N/A Taken: Lidocaine 5% topical ointment N/A N/A Pain Control: Necrotic/Eschar, Subcutaneous, N/A N/A Tissue Debrided: Slough Skin/Subcutaneous Tissue N/A N/A Level: 2.34 N/A N/A Debridement A (sq cm): rea Curette N/A N/A Instrument: Minimum N/A N/A Bleeding: Pressure N/A N/A Hemostasis Achieved: 0 N/A N/A Procedural Pain: 0 N/A N/A Post Procedural Pain: Debridement Treatment Response: Procedure  was tolerated well N/A N/A Post Debridement Measurements L x 1.3x1.8x1.3 N/A N/A W x D (cm) 2.389 N/A N/A Post Debridement Volume: (cm) Category/Stage IV N/A N/A Post Debridement Stage: Callus: Yes N/A N/A Periwound Skin Texture: Excoriation: No Induration: No Crepitus: No Rash: No Scarring: No Maceration: Yes N/A N/A Periwound Skin Moisture: Dry/Scaly: No Atrophie Blanche: No N/A N/A Periwound Skin Color: Cyanosis: No Ecchymosis: No Erythema: No Hemosiderin Staining: No Mottled: No Pallor: No Rubor: No Debridement N/A N/A Procedures Performed: Treatment Notes Electronic Signature(s) Signed: 07/06/2022 2:40:20 PM By: [name] MD FACS Entered By: [name] on 07/06/2022 14:40:20 [place] [name] ([id number]) [id number] Page 4 of 8 -------------------------------------------------------------------------------- Multi-Disciplinary Care Plan Details Patient Name: Date of Service: [name] 07/06/2022 2:30 PM Medical Record Number: [id number] Patient Account Number: [id number] Date of Birth/Sex: Treating RN: [birth year] (71 y.o. [name] Primary Care Zelene Barga: [name] Other Clinician: Referring Debrina Kizer: Treating Wallie Lagrand/Extender: [name] in Treatment: 30 Active Inactive Abuse / Safety / Falls / Self Care Management Nursing Diagnoses: History of Falls Impaired physical mobility Potential for falls Potential for injury related to falls Goals: Patient will remain injury free related to falls Date Initiated: 12/08/2021 Date Inactivated: 01/13/2022 Target Resolution Date: 01/05/2022 Goal Status: Met Patient/caregiver will verbalize understanding of skin care regimen Date Initiated: 12/08/2021 Target Resolution Date: 10/31/2022 Goal Status: Active Interventions: Assess fall risk on admission and as needed Assess: immobility, friction, shearing, incontinence upon  admission and as needed Assess impairment of mobility on admission and as needed per policy Provide education on fall prevention Notes: Wound/Skin Impairment Nursing Diagnoses: Impaired tissue integrity Knowledge deficit related to ulceration/compromised skin integrity Goals: Patient/caregiver will verbalize understanding of skin care regimen Date Initiated: 12/08/2021 Target Resolution Date: 10/31/2022 Goal Status: Active Ulcer/skin breakdown will have a volume reduction of 30% by week 4 Date Initiated: 12/08/2021 Date Inactivated: 01/13/2022 Target Resolution Date: 01/05/2022 Goal Status: Met Interventions: Assess patient/caregiver ability to perform ulcer/skin care regimen upon admission and as needed Assess ulceration(s) every visit Provide education on ulcer and skin care Treatment Activities: Skin care regimen initiated : 12/08/2021 Topical wound management initiated : 12/08/2021 Notes: Electronic Signature(s) Signed: 07/06/2022 5:09:37 PM By: Dellie Catholic RN Entered By: Dellie Catholic on 07/06/2022 16:52:22 [place] [name] ([id number]) [id number] Page 5 of 8 -------------------------------------------------------------------------------- Pain Assessment Details Patient Name: Date of Service: [name], [place] [name] 07/06/2022 2:30 PM  Medical Record Number: [id number] Patient Account Number: [id number] Date of Birth/Sex: Treating RN: 08/12/1950 (71 y.o. M) Primary Care Brytney Somes: [name] Other Clinician: Referring Raylinn Kosar: Treating Uzziah Rigg/Extender: [name] in Treatment: 30 Active Problems Location of Pain Severity and Description of Pain Patient Has Paino No Site Locations Pain Management and Medication Current Pain Management: Electronic Signature(s) Signed: 07/06/2022 3:19:49 PM By: Worthy Rancher Entered By: Worthy Rancher on 07/06/2022  14:10:09 -------------------------------------------------------------------------------- Patient/Caregiver Education Details Patient Name: Date of Service: [name] N 12/5/2023andnbsp2:30 PM Medical Record Number: [id number] Patient Account Number: [id number] Date of Birth/Gender: Treating RN: May 08, 1951 (71 y.o. [name] Primary Care Physician: [name] Other Clinician: Referring Physician: Treating Physician/Extender: [name] in Treatment: 30 Education Assessment Education Provided To: Patient Education Topics Provided Wound/Skin Impairment: Methods: Explain/Verbal Responses: Return demonstration correctly Electronic Signature(s) Signed: 07/06/2022 5:09:37 PM By: Dellie Catholic RN Entered By: Dellie Catholic on 07/06/2022 16:53:32 [place] [name] ([id number]) [id number] Page 6 of 8 -------------------------------------------------------------------------------- Wound Assessment Details Patient Name: Date of Service: [name] 07/06/2022 2:30 PM Medical Record Number: [id number] Patient Account Number: [id number] Date of Birth/Sex: Treating RN: Jan 09, 1951 (71 y.o. M) Primary Care Kinlee Garrison: [name] Other Clinician: Referring Latiffany Harwick: Treating Santana Gosdin/Extender: [name] in Treatment: 30 Wound Status Wound Number: 1 Primary Etiology: Pressure Ulcer Wound Location: Right Calcaneus Wound Status: Open Wounding Event: Pressure Injury Comorbid History: Cataracts, Hypertension, Cirrhosis , Hepatitis C Date Acquired: 07/02/2020 Weeks Of Treatment: 30 Clustered Wound: No Photos Wound Measurements Length: (cm) 1.3 Width: (cm) 1.8 Depth: (cm) 1.3 Area: (cm) 1.838 Volume: (cm) 2.389 % Reduction in Area: 92.6% % Reduction in Volume: 80.7% Epithelialization: Small (1-33%) Tunneling: No Undermining: Yes Starting Position (o'clock):  3 Ending Position (o'clock): 9 Maximum Distance: (cm) 0.8 Wound Description Classification: Category/Stage IV Wound Margin: Well defined, not attached Exudate Amount: Medium Exudate Type: Serosanguineous Exudate Color: red, brown Foul Odor After Cleansing: No Slough/Fibrino Yes Wound Bed Granulation Amount: Medium (34-66%) Exposed Structure Granulation Quality: Red, Pink Fascia Exposed: No Necrotic Amount: Medium (34-66%) Fat Layer (Subcutaneous Tissue) Exposed: Yes Necrotic Quality: Adherent Slough Tendon Exposed: No Muscle Exposed: No Joint Exposed: No Bone Exposed: No Periwound Skin Texture Texture Color No Abnormalities Noted: No No Abnormalities Noted: No Callus: Yes Atrophie Blanche: No Crepitus: No Cyanosis: No Excoriation: No Ecchymosis: No Induration: No Erythema: No Rash: No Hemosiderin Staining: No ZACHARIUS FUNARI, [name] ([id number]) [id number] Page 7 of 8 Scarring: No Mottled: No Pallor: No Moisture Rubor: No No Abnormalities Noted: No Dry / Scaly: No Maceration: Yes Treatment Notes Wound #1 (Calcaneus) Wound Laterality: Right Cleanser Soap and Water Discharge Instruction: May shower and wash wound with dial antibacterial soap and water prior to dressing change. Wound Cleanser Discharge Instruction: Cleanse the wound with wound cleanser prior to applying a clean dressing using gauze sponges, not tissue or cotton balls. Peri-Wound Care Topical Primary Dressing Iodoform packing strip 1/4 (in) Discharge Instruction: Lightly pack as instructed KerraCel Ag Gelling Fiber Dressing, 4x5 in (silver alginate) Discharge Instruction: Apply silver alginate to wound bed as instructed Secondary Dressing ALLEVYN Heel 4 1/2in x 5 1/2in / 10.5cm x 13.5cm Discharge Instruction: Apply over primary dressing as directed. Woven Gauze Sponge, Non-Sterile 4x4 in Discharge Instruction: Apply over primary dressing as directed. Secured  With Elastic Bandage 4 inch (ACE bandage) Discharge Instruction: Secure with ACE bandage as directed. Kerlix Roll Sterile, 4.5x3.1 (in/yd) Discharge Instruction: Secure with Kerlix as directed.  58M Medipore Soft Cloth Surgical T 2x10 (in/yd) ape Discharge Instruction: Secure with tape as directed. Compression Wrap Compression Stockings Add-Ons foot cover Discharge Instruction: use foot covers Electronic Signature(s) Signed: 07/06/2022 5:09:37 PM By: Dellie Catholic RN Entered By: Dellie Catholic on 07/06/2022 14:36:31 -------------------------------------------------------------------------------- Vitals Details Patient Name: Date of Service: [name], [name] 07/06/2022 2:30 PM Medical Record Number: [id number] Patient Account Number: [id number] Date of Birth/Sex: Treating RN: [birth year] (71 y.o. M) Primary Care Cailean Heacock: [name] Other Clinician: Referring Mattalynn Crandle: Treating Roczen Waymire/Extender: [name] in Treatment: 30 Vital Signs Time Taken: 02:05 Temperature (F): 98.5 Height (in): 69 Pulse (bpm): 70 [place], [place] ([id number]) [id number] Page 8 of 8 Weight (lbs): 165 Respiratory Rate (breaths/min): 20 Body Mass Index (BMI): 24.4 Blood Pressure (mmHg): 134/73 Reference Range: 80 - 120 mg / dl Electronic Signature(s) Signed: 07/06/2022 3:19:49 PM By: Worthy Rancher Entered By: Worthy Rancher on 07/06/2022 14:10:01

## 2022-07-20 ENCOUNTER — Encounter (HOSPITAL_BASED_OUTPATIENT_CLINIC_OR_DEPARTMENT_OTHER): Payer: 59 | Admitting: General Surgery

## 2022-08-03 ENCOUNTER — Encounter (HOSPITAL_BASED_OUTPATIENT_CLINIC_OR_DEPARTMENT_OTHER): Payer: 59 | Admitting: General Surgery

## 2022-08-10 ENCOUNTER — Encounter (HOSPITAL_BASED_OUTPATIENT_CLINIC_OR_DEPARTMENT_OTHER): Payer: 59 | Admitting: General Surgery

## 2022-08-17 ENCOUNTER — Encounter (HOSPITAL_BASED_OUTPATIENT_CLINIC_OR_DEPARTMENT_OTHER): Payer: 59 | Attending: General Surgery | Admitting: General Surgery

## 2022-08-17 DIAGNOSIS — I10 Essential (primary) hypertension: Secondary | ICD-10-CM | POA: Insufficient documentation

## 2022-08-17 DIAGNOSIS — K7460 Unspecified cirrhosis of liver: Secondary | ICD-10-CM | POA: Insufficient documentation

## 2022-08-17 DIAGNOSIS — I252 Old myocardial infarction: Secondary | ICD-10-CM | POA: Insufficient documentation

## 2022-08-17 DIAGNOSIS — B182 Chronic viral hepatitis C: Secondary | ICD-10-CM | POA: Insufficient documentation

## 2022-08-17 DIAGNOSIS — L84 Corns and callosities: Secondary | ICD-10-CM | POA: Diagnosis not present

## 2022-08-17 DIAGNOSIS — I6930 Unspecified sequelae of cerebral infarction: Secondary | ICD-10-CM | POA: Diagnosis not present

## 2022-08-17 DIAGNOSIS — L89610 Pressure ulcer of right heel, unstageable: Secondary | ICD-10-CM | POA: Diagnosis present

## 2022-08-17 DIAGNOSIS — Z8616 Personal history of COVID-19: Secondary | ICD-10-CM | POA: Insufficient documentation

## 2022-08-17 NOTE — Progress Notes (Signed)
[place], [name] ([id number]) [id number] Page 1 of 8 Visit Report for 08/17/2022 Arrival Information Details Patient Name: Date of Service: [name] 08/17/2022 12:30 PM Medical Record Number: [id number] Patient Account Number: [id number] Date of Birth/Sex: Treating RN: [birth year] (72 y.o. [name] Primary Care Analyah Mcconnon: [name] Other Clinician: Referring Margaruite Top: Treating Cassidy Tabet/Extender: Milagros Evener in Treatment: 36 Visit Information History Since Last Visit Added or deleted any medications: No Patient Arrived: Wheel Chair Any new allergies or adverse reactions: No Arrival Time: 12:33 Had a fall or experienced change in No Accompanied By: sister activities of daily living that may affect Transfer Assistance: None risk of falls: Patient Identification Verified: Yes Signs or symptoms of abuse/neglect since last visito No Patient Requires Transmission-Based Precautions: No Hospitalized since last visit: No Patient Has Alerts: Yes Implantable device outside of the clinic excluding No Patient Alerts: Patient on Blood Thinner cellular tissue based products placed in the center since last visit: Has Dressing in Place as Prescribed: Yes Pain Present Now: No Electronic Signature(s) Signed: 08/17/2022 4:21:13 PM By: Dellie Catholic RN Entered By: Dellie Catholic on 08/17/2022 13:05:55 -------------------------------------------------------------------------------- Encounter Discharge Information Details Patient Name: Date of Service: [name], [name] 08/17/2022 12:30 PM Medical Record Number: [id number] Patient Account Number: [id number] Date of Birth/Sex: Treating RN: 07-May-1951 (72 y.o. [name] Primary Care Lameshia Hypolite: [name] Other Clinician: Referring Idali Lafever: Treating Meiko Stranahan/Extender: Milagros Evener in Treatment: 36 Encounter Discharge  Information Items Post Procedure Vitals Discharge Condition: Stable Temperature (F): 98.1 Ambulatory Status: Wheelchair Pulse (bpm): 56 Discharge Destination: Home Respiratory Rate (breaths/min): 16 Transportation: Private Auto Blood Pressure (mmHg): 119/76 Accompanied By: sister Schedule Follow-up Appointment: Yes Clinical Summary of Care: Patient Declined Electronic Signature(s) Signed: 08/17/2022 4:21:13 PM By: Dellie Catholic RN Entered By: Dellie Catholic on 08/17/2022 16:15:26 [place] [name] ([id number]) [id number] Page 2 of 8 -------------------------------------------------------------------------------- Lower Extremity Assessment Details Patient Name: Date of Service: [name] 08/17/2022 12:30 PM Medical Record Number: [id number] Patient Account Number: [id number] Date of Birth/Sex: Treating RN: July 23, 1951 (72 y.o. [name] Primary Care Keaun Schnabel: [name] Other Clinician: Referring Dottie Vaquerano: Treating Mykaylah Ballman/Extender: Milagros Evener in Treatment: 36 Edema Assessment Assessed: Shirlyn Goltz: No] [name]: No] Edema: [Left: [name]] [Right: o] Calf Left: Right: Point of Measurement: From Medial Instep 33.9 cm Ankle Left: Right: Point of Measurement: From Medial Instep 22.9 cm Vascular Assessment Pulses: Dorsalis Pedis Palpable: [Right:Yes] Electronic Signature(s) Signed: 08/17/2022 4:21:13 PM By: Dellie Catholic RN Entered By: Dellie Catholic on 08/17/2022 13:06:47 -------------------------------------------------------------------------------- Multi Wound Chart Details Patient Name: Date of Service: [name], [name] 08/17/2022 12:30 PM Medical Record Number: [id number] Patient Account Number: [id number] Date of Birth/Sex: Treating RN: 12/19/1950 (72 y.o. M) Primary Care Nikea Settle: [name] Other Clinician: Referring Cleveland Paiz: Treating Candies Palm/Extender: Milagros Evener in Treatment: 36 Vital Signs Height(in): 57 Pulse(bpm): 26 Weight(lbs): 165 Blood Pressure(mmHg): 119/76 Body Mass Index(BMI): 24.4 Temperature(F): 98.1 Respiratory Rate(breaths/min): 16 [1:Photos:] [[name]/A:[name]/A] Right Calcaneus [name]/A [name]/A Wound Location: Pressure Injury [name]/A [name]/A Wounding Event: Pressure Ulcer [name]/A [name]/A Primary Etiology: Cataracts, Hypertension, Cirrhosis , [name]/A [name]/A Comorbid History: Hepatitis C 07/02/2020 [name]/A [name]/A Date Acquired: 45 [name]/A [name]/A Weeks of Treatment: Open [name]/A [name]/A Wound Status: No [name]/A [name]/A Wound Recurrence: 1.2x1.3x1.3 [name]/A [name]/A Measurements L x W x D (cm) 1.225 [name]/A [name]/A A (cm) : rea 1.593 [name]/A [name]/A Volume (cm) : 95.00% [name]/A [name]/A %  Reduction in A rea: 87.10% [name]/A [name]/A % Reduction in Volume: 3 Starting Position 1 (o'clock): 9 Ending Position 1 (o'clock): 0.7 Maximum Distance 1 (cm): Yes [name]/A [name]/A Undermining: Category/Stage IV [name]/A [name]/A Classification: Medium [name]/A [name]/A Exudate A mount: Serosanguineous [name]/A [name]/A Exudate Type: red, brown [name]/A [name]/A Exudate Color: Well defined, not attached [name]/A [name]/A Wound Margin: Large (67-100%) [name]/A [name]/A Granulation A mount: Red, Hyper-granulation [name]/A [name]/A Granulation Quality: Small (1-33%) [name]/A [name]/A Necrotic A mount: Fat Layer (Subcutaneous Tissue): Yes [name]/A [name]/A Exposed Structures: Fascia: No Tendon: No Muscle: No Joint: No Bone: No Small (1-33%) [name]/A [name]/A Epithelialization: Debridement - Excisional [name]/A [name]/A Debridement: Pre-procedure Verification/Time Out 13:03 [name]/A [name]/A Taken: Lidocaine 5% topical ointment [name]/A [name]/A Pain Control: Subcutaneous, Slough [name]/A [name]/A Tissue Debrided: Skin/Subcutaneous Tissue [name]/A [name]/A Level: 1.56 [name]/A [name]/A Debridement A (sq cm): rea Curette [name]/A [name]/A Instrument: Minimum [name]/A [name]/A Bleeding: Pressure [name]/A [name]/A Hemostasis A chieved: 0 [name]/A [name]/A Procedural Pain: 0 [name]/A [name]/A Post Procedural Pain: Procedure was tolerated well [name]/A [name]/A Debridement Treatment Response: 1.2x1.3x1.3 [name]/A  [name]/A Post Debridement Measurements L x W x D (cm) 1.593 [name]/A [name]/A Post Debridement Volume: (cm) Category/Stage IV [name]/A [name]/A Post Debridement Stage: Callus: Yes [name]/A [name]/A Periwound Skin Texture: Excoriation: No Induration: No Crepitus: No Rash: No Scarring: No Maceration: Yes [name]/A [name]/A Periwound Skin Moisture: Dry/Scaly: No Atrophie Blanche: No [name]/A [name]/A Periwound Skin Color: Cyanosis: No Ecchymosis: No Erythema: No Hemosiderin Staining: No Mottled: No Pallor: No Rubor: No Debridement [name]/A [name]/A Procedures Performed: Treatment Notes Electronic Signature(s) Signed: 08/17/2022 1:29:45 PM By: [name] MD FACS Entered By: [name] on 08/17/2022 13:29:45 [place] [name] ([id number]) [id number] Page 4 of 8 -------------------------------------------------------------------------------- Multi-Disciplinary Care Plan Details Patient Name: Date of Service: [name] 08/17/2022 12:30 PM Medical Record Number: [id number] Patient Account Number: [id number] Date of Birth/Sex: Treating RN: [birth year] (72 y.o. [name] Primary Care Mava Suares: [name] Other Clinician: Referring Amjad Fikes: Treating Mitsue Peery/Extender: Milagros Evener in Treatment: 46 Active Inactive Abuse / Safety / Falls / Self Care Management Nursing Diagnoses: History of Falls Impaired physical mobility Potential for falls Potential for injury related to falls Goals: Patient will remain injury free related to falls Date Initiated: 12/08/2021 Date Inactivated: 01/13/2022 Target Resolution Date: 01/05/2022 Goal Status: Met Patient/caregiver will verbalize understanding of skin care regimen Date Initiated: 12/08/2021 Target Resolution Date: 10/31/2022 Goal Status: Active Interventions: Assess fall risk on admission and as needed Assess: immobility, friction, shearing, incontinence upon admission and as needed Assess impairment of  mobility on admission and as needed per policy Provide education on fall prevention Notes: Wound/Skin Impairment Nursing Diagnoses: Impaired tissue integrity Knowledge deficit related to ulceration/compromised skin integrity Goals: Patient/caregiver will verbalize understanding of skin care regimen Date Initiated: 12/08/2021 Target Resolution Date: 10/31/2022 Goal Status: Active Ulcer/skin breakdown will have a volume reduction of 30% by week 4 Date Initiated: 12/08/2021 Date Inactivated: 01/13/2022 Target Resolution Date: 01/05/2022 Goal Status: Met Interventions: Assess patient/caregiver ability to perform ulcer/skin care regimen upon admission and as needed Assess ulceration(s) every visit Provide education on ulcer and skin care Treatment Activities: Skin care regimen initiated : 12/08/2021 Topical wound management initiated : 12/08/2021 Notes: Electronic Signature(s) Signed: 08/17/2022 4:21:13 PM By: Dellie Catholic RN Entered By: Dellie Catholic on 08/17/2022 16:13:49 [place] [name] ([id number]) [id number] Page 5 of 8 -------------------------------------------------------------------------------- Pain Assessment Details Patient Name: Date of Service: [name] 08/17/2022 12:30 PM Medical Record Number: [id number] Patient Account  Number: [id number] Date of Birth/Sex: Treating RN: 1951-03-03 (72 y.o. [name] Primary Care Najai Waszak: [name] Other Clinician: Referring Syaire Saber: Treating Lamiya Naas/Extender: Milagros Evener in Treatment: 36 Active Problems Location of Pain Severity and Description of Pain Patient Has Paino No Site Locations Pain Management and Medication Current Pain Management: Electronic Signature(s) Signed: 08/17/2022 4:21:13 PM By: Dellie Catholic RN Entered By: Dellie Catholic on 08/17/2022  13:06:31 -------------------------------------------------------------------------------- Patient/Caregiver Education Details Patient Name: Date of Service: [name] 1/16/2024andnbsp12:30 PM Medical Record Number: [id number] Patient Account Number: [id number] Date of Birth/Gender: Treating RN: [birth year] (72 y.o. [name] Primary Care Physician: [name] Other Clinician: Referring Physician: Treating Physician/Extender: Milagros Evener in Treatment: 70 Education Assessment Education Provided To: Patient Education Topics Provided Wound/Skin Impairment: Methods: Explain/Verbal Responses: Return demonstration correctly Electronic Signature(s) Signed: 08/17/2022 4:21:13 PM By: Dellie Catholic RN Entered By: Dellie Catholic on 08/17/2022 16:14:01 [place] [name] ([id number]) [id number] Page 6 of 8 -------------------------------------------------------------------------------- Wound Assessment Details Patient Name: Date of Service: [name] 08/17/2022 12:30 PM Medical Record Number: [id number] Patient Account Number: [id number] Date of Birth/Sex: Treating RN: [birth year] (72 y.o. [name] Primary Care Willetta York: [name] Other Clinician: Referring Larisa Lanius: Treating Tenia Goh/Extender: Milagros Evener in Treatment: 36 Wound Status Wound Number: 1 Primary Etiology: Pressure Ulcer Wound Location: Right Calcaneus Wound Status: Open Wounding Event: Pressure Injury Comorbid History: Cataracts, Hypertension, Cirrhosis , Hepatitis C Date Acquired: 07/02/2020 Weeks Of Treatment: 36 Clustered Wound: No Photos Wound Measurements Length: (cm) 1.2 Width: (cm) 1.3 Depth: (cm) 1.3 Area: (cm) 1.225 Volume: (cm) 1.593 % Reduction in Area: 95% % Reduction in Volume: 87.1% Epithelialization: Small (1-33%) Undermining: Yes Starting Position (o'clock):  3 Ending Position (o'clock): 9 Maximum Distance: (cm) 0.7 Wound Description Classification: Category/Stage IV Wound Margin: Well defined, not attached Exudate Amount: Medium Exudate Type: Serosanguineous Exudate Color: red, brown Foul Odor After Cleansing: No Slough/Fibrino Yes Wound Bed Granulation Amount: Large (67-100%) Exposed Structure Granulation Quality: Red, Hyper-granulation Fascia Exposed: No Necrotic Amount: Small (1-33%) Fat Layer (Subcutaneous Tissue) Exposed: Yes Necrotic Quality: Adherent Slough Tendon Exposed: No Muscle Exposed: No Joint Exposed: No Bone Exposed: No Periwound Skin Texture Texture Color No Abnormalities Noted: No No Abnormalities Noted: No Callus: Yes Atrophie Blanche: No Crepitus: No Cyanosis: No Excoriation: No Ecchymosis: No Induration: No Erythema: No Rash: No Hemosiderin Staining: No Scarring: No Mottled: No Pallor: No [name], [name] ([id number]) [id number] Page 7 of 8 Pallor: No Moisture Rubor: No No Abnormalities Noted: No Dry / Scaly: No Maceration: Yes Treatment Notes Wound #1 (Calcaneus) Wound Laterality: Right Cleanser Soap and Water Discharge Instruction: May shower and wash wound with dial antibacterial soap and water prior to dressing change. Wound Cleanser Discharge Instruction: Cleanse the wound with wound cleanser prior to applying a clean dressing using gauze sponges, not tissue or cotton balls. Peri-Wound Care Topical Primary Dressing Iodoform packing strip 1/4 (in) Discharge Instruction: Lightly pack as instructed KerraCel Ag Gelling Fiber Dressing, 4x5 in (silver alginate) Discharge Instruction: Apply silver alginate to wound bed as instructed Secondary Dressing ALLEVYN Heel 4 1/2in x 5 1/2in / 10.5cm x 13.5cm Discharge Instruction: Apply over primary dressing as directed. Woven Gauze Sponge, Non-Sterile 4x4 in Discharge Instruction: Apply over primary dressing as  directed. Secured With Elastic Bandage 4 inch (ACE bandage) Discharge Instruction: Secure with ACE bandage as directed. Kerlix Roll Sterile, 4.5x3.1 (in/yd) Discharge Instruction: Secure with Kerlix as directed. 38M  Medipore Soft Cloth Surgical T 2x10 (in/yd) ape Discharge Instruction: Secure with tape as directed. Compression Wrap Compression Stockings Add-Ons foot cover Discharge Instruction: use foot covers Electronic Signature(s) Signed: 08/17/2022 4:21:13 PM By: Dellie Catholic RN Entered By: Dellie Catholic on 08/17/2022 12:57:33 -------------------------------------------------------------------------------- Vitals Details Patient Name: Date of Service: [name], [name] 08/17/2022 12:30 PM Medical Record Number: [id number] Patient Account Number: [id number] Date of Birth/Sex: Treating RN: July 22, 1951 (72 y.o. [name] Primary Care Caiya Bettes: [name] Other Clinician: Referring Smith Potenza: Treating Wylodean Shimmel/Extender: Milagros Evener in Treatment: 36 Vital Signs Time Taken: 12:40 Temperature (F): 98.1 Height (in): 69 Pulse (bpm): 56 Weight (lbs): 165 Respiratory Rate (breaths/min): 16 [name], [name] ([id number]) [phone].pdf Page 8 of 8 Body Mass Index (BMI): 24.4 Blood Pressure (mmHg): 119/76 Reference Range: 80 - 120 mg / dl Electronic Signature(s) Signed: 08/17/2022 4:21:13 PM By: Dellie Catholic RN Entered By: Dellie Catholic on 08/17/2022 13:06:24

## 2022-08-17 NOTE — Progress Notes (Signed)
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 the wound bed including Adherent Slough. The periwound skin appearance exhibited: Callus, Maceration. The periwound skin appearance did not exhibit: Crepitus, Excoriation, Induration, Rash, Scarring, Dry/Scaly, Atrophie Blanche, Cyanosis, Ecchymosis, Hemosiderin Staining, Mottled, Pallor, Rubor, Erythema. 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 There was a Excisional Skin/Subcutaneous Tissue Debridement with a total area of 1.56 sq cm performed by [name], MD. With the following instrument(s): Curette to remove Non-Viable tissue/material.  Material removed includes Subcutaneous Tissue and Slough and after achieving pain control using Lidocaine 5% topical ointment. No specimens were taken. A time out was conducted at 13:03, prior to the start of the procedure. A Minimum amount of bleeding was controlled with Pressure. The procedure was tolerated well with a pain level of 0 throughout and a pain level of 0 following the procedure. Post Debridement Measurements: 1.2cm length x 1.3cm width x 1.3cm depth; 1.593cm^3 volume. Post debridement Stage noted as Category/Stage IV. Character of Wound/Ulcer Post Debridement is improved. Post procedure Diagnosis Wound #1: Same as Pre-Procedure General Notes: Scribed for Dr. Celine Ahr by J.Scotton. Plan Follow-up Appointments: Return Appointment in 2 weeks. - Dr. Celine Ahr Room 3 Bathing/ Shower/ Hygiene: May shower and wash wound with soap and water. - with dressing changes Home Health: New wound care orders this week; continue Home Health for wound care. May utilize formulary equivalent dressing for wound treatment orders unless otherwise specified. - iodoform Packing strips to undermining area of wound, cover with silver alginate, gauze, heel cup and kerlix silver alginate and gauze to toe Dressing changes to be completed by [place] on Monday / Wednesday / Friday except when patient has scheduled visit at [facility] Other Home Health Orders/Instructions: - [facility] 3x a week WOUND #1: - Calcaneus Wound Laterality: Right Cleanser: Soap and Water 3 x Per Week/30 Days Discharge Instructions: May shower and wash wound with dial antibacterial soap and water prior to dressing change. [name], [name] ([id number]) [id number] Page 8 of 10 Cleanser: Wound Cleanser 3 x Per Week/30 Days Discharge Instructions: Cleanse the wound with wound cleanser prior to applying a clean dressing using gauze sponges, not tissue or cotton balls. Prim Dressing: Iodoform packing  strip 1/4 (in) 3 x Per Week/30 Days ary Discharge Instructions: Lightly pack as instructed Prim Dressing: KerraCel Ag Gelling Fiber Dressing, 4x5 in (silver alginate) 3 x Per Week/30 Days ary Discharge Instructions: Apply silver alginate to wound bed as instructed Secondary Dressing: ALLEVYN Heel 4 1/2in x 5 1/2in / 10.5cm x 13.5cm (Generic) 3 x Per Week/30 Days Discharge Instructions: Apply over primary dressing as directed. Secondary Dressing: Woven  Gauze Sponge, Non-Sterile 4x4 in 3 x Per Week/30 Days Discharge Instructions: Apply over primary dressing as directed. Secured With: Elastic Bandage 4 inch (ACE bandage) (Generic) 3 x Per Week/30 Days Discharge Instructions: Secure with ACE bandage as directed. Secured With: [employer], 4.5x3.1 (in/yd) (Generic) 3 x Per Week/30 Days Discharge Instructions: Secure with Kerlix as directed. Secured With: 45M Medipore Public affairs consultant Surgical T 2x10 (in/yd) (Generic) 3 x Per Week/30 Days ape Discharge Instructions: Secure with tape as directed. Add-Ons: foot cover (Generic) 3 x Per Week/30 Days Discharge Instructions: use foot covers 08/17/2022: The undermining is a little bit deeper today, but narrower. There is good granulation tissue on the surface. He has accumulated some slough and eschar. I used a curette to debride slough, eschar, and nonviable subcutaneous tissue from the wound. We will continue to pack the wound cavity with iodoform packing strips. Continue silver alginate to the remaining open area on the calcaneus. Continue offloading. Follow-up in 2 weeks. Electronic Signature(s) Signed: 08/17/2022 1:32:33 PM By: [name] MD FACS Entered By: [name] on 08/17/2022 13:32:33 -------------------------------------------------------------------------------- HxROS Details Patient Name: Date of Service: [name], [name] 08/17/2022 12:30 PM Medical Record Number: [id number] Patient Account Number: [id number] Date of  Birth/Sex: Treating RN: 12-Jun-1951 (71 y.o. M) Primary Care Provider: [name] Other Clinician: Referring Provider: Treating Provider/Extender: Milagros Evener in Treatment: 36 Information Obtained From Patient Eyes Medical History: Positive for: Cataracts Hematologic/Lymphatic Medical History: Past Medical History Notes: hyperlipidemia Cardiovascular Medical History: Positive for: Hypertension Gastrointestinal Medical History: Positive for: Cirrhosis ; Hepatitis C - says it is gone Past Medical History Notes: colon polyps Genitourinary Medical History: Past Medical History Notes: [place], [name] ([id number]) [id number] Page 9 of 10 BPH Musculoskeletal Medical History: Past Medical History Notes: arthritis Neurologic Medical History: Past Medical History Notes: stroke HBO Extended History Items Eyes: Cataracts Immunizations Pneumococcal Vaccine: Received Pneumococcal Vaccination: Yes Received Pneumococcal Vaccination On or After 60th Birthday: Yes Implantable Devices None Hospitalization / Surgery History Type of Hospitalization/Surgery inguinal hernia repair knee surgery x4 Family and Social History Cancer: Yes - Mother,Father; Diabetes: Yes - Siblings; Heart Disease: Yes - Maternal Grandparents; Hereditary Spherocytosis: No; Hypertension: Yes - Mother; Kidney Disease: No; Lung Disease: Yes - Mother; Seizures: No; Stroke: Yes - Mother; Thyroid Problems: Yes - Siblings; Tuberculosis: No; Former smoker - ended on 09/25/1982; Marital Status - Single; Alcohol Use: Never; Drug Use: Prior History - cocaine, heroin, marijuana; Caffeine Use: Never; Financial Concerns: No; Food, Clothing or Shelter Needs: No; Support System Lacking: Yes; Transportation Concerns: No Electronic Signature(s) Signed: 08/17/2022 1:39:54 PM By: [name] MD FACS Entered By: [name] on 08/17/2022  13:31:00 -------------------------------------------------------------------------------- SuperBill Details Patient Name: Date of Service: [name], [name] 08/17/2022 Medical Record Number: [id number] Patient Account Number: [id number] Date of Birth/Sex: Treating RN: [birth year] (72 y.o. M) Primary Care Provider: [name] Other Clinician: Referring Provider: Treating Provider/Extender: Milagros Evener in Treatment: 36 Diagnosis Coding ICD-10 Codes Code Description L89.610 Pressure ulcer of right heel, unstageable Z86.73 Personal history of transient ischemic attack (TIA), and cerebral infarction without residual deficits K74.60 Unspecified cirrhosis of liver B18.2 Chronic viral hepatitis C I63.9 Cerebral infarction, unspecified I10 Essential (primary) hypertension L97.512 Non-pressure chronic ulcer of other part of right foot with fat layer exposed Facility Procedures [name], [name] ([id number]): CPT4 Code Description [id number] 11042 - DEB SUBQ TISSUE 20 SQ CM/< ICD-10 Diagnosis Description L89.610 Pressure ulcer of right heel, unstageable  [phone].pdf Page 10 of 10: Modifier Quantity 1 Physician Procedures : CPT4 Code Description Modifier [id number] 91980 - WC PHYS LEVEL 4 - EST PT 25 ICD-10 Diagnosis Description L89.610 Pressure ulcer of right heel, unstageable Z86.73 Personal history of transient ischemic attack (TIA), and cerebral infarction without  residual deficits I10 Essential (primary) hypertension K74.60 Unspecified cirrhosis of liver Quantity: 1 : [id number] 11042 - WC PHYS SUBQ TISS 20 SQ CM ICD-10 Diagnosis Description L89.610 Pressure ulcer of right heel, unstageable Quantity: 1 Electronic Signature(s) Signed: 08/17/2022 1:32:59 PM By: [name] MD FACS Entered By: [name] on 08/17/2022 13:32:58

## 2022-08-31 ENCOUNTER — Encounter (HOSPITAL_BASED_OUTPATIENT_CLINIC_OR_DEPARTMENT_OTHER): Payer: 59 | Admitting: General Surgery

## 2022-08-31 DIAGNOSIS — L89610 Pressure ulcer of right heel, unstageable: Secondary | ICD-10-CM | POA: Diagnosis not present

## 2022-08-31 NOTE — Progress Notes (Signed)
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 Pressure Injury. The date acquired was: 07/02/2020. The wound has been in treatment 38 weeks. The wound is located on the Right Calcaneus. The wound measures 0.7cm length x 1.2cm width x 2.2cm depth; 0.66cm^2 area and 1.451cm^3 volume. There is Fat Layer (Subcutaneous Tissue) exposed. There is no tunneling or undermining noted. There is a medium amount of serosanguineous drainage noted. The wound margin is well defined and not attached to the wound base. There is large (67-100%) red granulation within the wound bed. There is a small (1-33%) amount of necrotic tissue within the wound bed including Adherent Slough. The periwound skin appearance had no abnormalities noted for moisture. The periwound skin appearance had no abnormalities noted for color. The periwound skin appearance exhibited: Callus. The periwound skin appearance did not exhibit: Crepitus, Excoriation, Induration, Rash, Scarring. 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 There was a Excisional Skin/Subcutaneous Tissue Debridement with a total area of 2.25 sq cm performed by [name], MD. With the following instrument(s): Curette to remove Viable and Non-Viable tissue/material. Material removed includes Callus, Subcutaneous Tissue, Slough, and Skin: Epidermis after achieving pain control using Lidocaine 4% T opical Solution. No specimens were taken. A time out was conducted at 13:40, prior to the start of the procedure. A Minimum amount of bleeding was controlled with Pressure. The procedure was tolerated well with a pain level of 0 throughout and a pain level of 0 following the procedure. Post Debridement Measurements: 0.7cm length x 1.2cm width x 2.2cm depth; 1.451cm^3 volume. Post debridement Stage noted as Category/Stage IV. Character of Wound/Ulcer Post Debridement is improved. Post procedure Diagnosis Wound #1: Same as Pre-Procedure General Notes: Scribed for Dr. Celine Ahr by Baruch Gouty. RN. Plan Follow-up Appointments: Return Appointment in 2 weeks. - Dr. Celine Ahr Room [address] [name] ([id number]) [id number] Page 8 of 10 Anesthetic: Wound #1 Right Calcaneus: (In clinic) Topical Lidocaine 4% applied to wound bed Bathing/ Shower/ Hygiene: May shower and wash wound with soap and water. - with dressing changes Off-Loading: Other: - heel offloading sandal, avoid pressure to back of the heel with pillows under calves to float heels Home Health: New wound care orders this  week; continue Home Health for wound care. May utilize formulary equivalent dressing for wound treatment orders unless otherwise specified. - slide hydrofera blue into undermined area Dressing changes to be completed by [place] on Monday / Wednesday / Friday except when patient has scheduled visit at [facility]. Other Home Health Orders/Instructions: - [facility] 3x a week WOUND #1: - Calcaneus Wound Laterality: Right Cleanser: Soap and Water 3 x Per Week/30 Days Discharge Instructions: May shower and wash wound with dial antibacterial soap and  water prior to dressing change. Cleanser: Wound Cleanser 3 x Per Week/30 Days Discharge Instructions: Cleanse the wound with wound cleanser prior to applying a clean dressing using gauze sponges, not tissue or cotton balls. Prim Dressing: Hydrofera Blue Classic Foam, 2x2 in 3 x Per Week/30 Days ary Discharge Instructions: Moisten with saline prior to applying to wound bed Secondary Dressing: ALLEVYN Heel 4 1/2in x 5 1/2in / 10.5cm x 13.5cm (Generic) 3 x Per Week/30 Days Discharge Instructions: Apply over primary dressing as directed. Secondary Dressing: Woven Gauze Sponge, Non-Sterile 4x4 in 3 x Per Week/30 Days Discharge Instructions: Apply over primary dressing as directed. Secured With: Elastic Bandage 4 inch (ACE bandage) (Generic) 3 x Per Week/30 Days Discharge Instructions: Secure with ACE bandage as directed. Secured With: [employer], 4.5x3.1 (in/yd) (Generic) 3 x Per Week/30 Days Discharge Instructions: Secure with Kerlix as directed. Secured With: 66M Medipore Public affairs consultant Surgical T 2x10 (in/yd) (Generic) 3 x Per Week/30 Days ape Discharge Instructions: Secure with tape as directed. Add-Ons: foot cover (Generic) 3 x Per Week/30 Days Discharge Instructions: use foot covers 08/31/2022: Epithelium is encroaching over the surface of the wound. There is some callus and eschar accumulation here. The undermining is 2.2 cm with  good granulation tissue visible. No concern for infection. I used a curette to debride the wound, removing callus, slough, dry skin, and nonviable subcutaneous tissue. We are going to change his dressing to [facility], packing it into the undermined portion of the wound as well as laying it over of the exposed surface. Follow-up in 2 weeks. Electronic Signature(s) Signed: 08/31/2022 2:10:45 PM By: [name] MD FACS Entered By: [name] on 08/31/2022 14:10:44 -------------------------------------------------------------------------------- HxROS Details Patient Name: Date of Service: [name], NA Micheal [name] 08/31/2022 1:15 PM Medical Record Number: [id number] Patient Account Number: [id number] Date of Birth/Sex: Treating RN: Dec 26, 1950 (72 y.o. M) Primary Care Provider: [name] Other Clinician: Referring Provider: Treating Provider/Extender: Milagros Evener in Treatment: 38 Information Obtained From Patient Eyes Medical History: Positive for: Cataracts Hematologic/Lymphatic Medical History: Past Medical History Notes: hyperlipidemia Cardiovascular Medical History: Positive for: Hypertension [name], [name] ([id number]) [id number] Page 9 of 10 Gastrointestinal Medical History: Positive for: Cirrhosis ; Hepatitis C - says it is gone Past Medical History Notes: colon polyps Genitourinary Medical History: Past Medical History Notes: BPH Musculoskeletal Medical History: Past Medical History Notes: arthritis Neurologic Medical History: Past Medical History Notes: stroke HBO Extended History Items Eyes: Cataracts Immunizations Pneumococcal Vaccine: Received Pneumococcal Vaccination: Yes Received Pneumococcal Vaccination On or After 60th Birthday: Yes Implantable Devices None Hospitalization / Surgery History Type of Hospitalization/Surgery inguinal hernia repair knee surgery x4 Family and  Social History Cancer: Yes - Mother,Father; Diabetes: Yes - Siblings; Heart Disease: Yes - Maternal Grandparents; Hereditary Spherocytosis: No; Hypertension: Yes - Mother; Kidney Disease: No; Lung Disease: Yes - Mother; Seizures: No; Stroke: Yes - Mother; Thyroid Problems: Yes - Siblings; Tuberculosis: No; Former smoker - ended on 09/25/1982; Marital Status - Single; Alcohol Use: Never; Drug Use: Prior History - cocaine, heroin, marijuana; Caffeine Use: Never; Financial Concerns: No; Food, Clothing or Shelter Needs: No; Support System Lacking: Yes; Transportation Concerns: No Electronic Signature(s) Signed: 08/31/2022 3:42:54 PM By: [name] MD FACS Entered By: [name] on 08/31/2022 14:02:29 -------------------------------------------------------------------------------- SuperBill Details Patient Name: Date of Service: [name] [name] 08/31/2022 Medical Record Number: [id number] Patient Account Number: [id number] Date of Birth/Sex: Treating RN: May 28, 1951 (72 y.o. M) Primary Care Provider: Daphene Jaeger,  Arnette Norris Other Clinician: Referring Provider: Treating Provider/Extender: Milagros Evener in Treatment: 38 Diagnosis Coding ICD-10 Codes Code Description L89.610 Pressure ulcer of right heel, [name], [name] ([id number]) [id number] Page 10 of 10 [phone] Personal history of transient ischemic attack (TIA), and cerebral infarction without residual deficits K74.60 Unspecified cirrhosis of liver B18.2 Chronic viral hepatitis C I63.9 Cerebral infarction, unspecified I10 Essential (primary) hypertension L97.512 Non-pressure chronic ulcer of other part of right foot with fat layer exposed Facility Procedures : CPT4 Code: 54627035 Description: 00938 - DEB SUBQ TISSUE 20 SQ CM/< ICD-10 Diagnosis Description L89.610 Pressure ulcer of right heel, unstageable Modifier: Quantity: 1 Physician Procedures : CPT4 Code  Description Modifier [id number] - WC PHYS LEVEL 3 - EST PT 25 ICD-10 Diagnosis Description L89.610 Pressure ulcer of right heel, unstageable K74.60 Unspecified cirrhosis of liver I63.9 Cerebral infarction, unspecified I10 Essential  (primary) hypertension Quantity: 1 : [id number] - WC PHYS SUBQ TISS 20 SQ CM ICD-10 Diagnosis Description L89.610 Pressure ulcer of right heel, unstageable Quantity: 1 Electronic Signature(s) Signed: 08/31/2022 2:11:13 PM By: [name] MD FACS Entered By: [name] on 08/31/2022 14:11:13

## 2022-08-31 NOTE — Progress Notes (Addendum)
[name], [name] ([id number]) [id number] Page 1 of 7 Visit Report for 08/31/2022 Arrival Information Details Patient Name: Date of Service: [name] 08/31/2022 1:15 PM Medical Record Number: [id number] Patient Account Number: [id number] Date of Birth/Sex: Treating RN: August 03, 1950 (72 y.o. M) Primary Care [name]: [name] Other Clinician: Referring [name]: Treating [name]/Extender: [name] in Treatment: 65 Visit Information History Since Last Visit All ordered tests and consults were completed: No Patient Arrived: Wheel Chair Added or deleted any medications: No Arrival Time: 12:50 Any new allergies or adverse reactions: No Accompanied By: wife Had a fall or experienced change in No Transfer Assistance: Manual activities of daily living that may affect Patient Identification Verified: Yes risk of falls: Secondary Verification Process Completed: Yes Signs or symptoms of abuse/neglect since last visito No Patient Requires Transmission-Based Precautions: No Hospitalized since last visit: No Patient Has Alerts: Yes Implantable device outside of the clinic excluding No Patient Alerts: Patient on Blood Thinner cellular tissue based products placed in the center since last visit: Pain Present Now: No Electronic Signature(s) Signed: 08/31/2022 1:07:36 PM By: [name] Entered By: [name] on 08/31/2022 12:51:24 -------------------------------------------------------------------------------- Lower Extremity Assessment Details Patient Name: Date of Service: [name] 08/31/2022 1:15 PM Medical Record Number: [id number] Patient Account Number: [id number] Date of Birth/Sex: Treating RN: 24-Nov-1950 (72 y.o. [name] Primary Care [name]: [name] Other Clinician: Referring [name]: Treating [name]/Extender: [name] in Treatment: 38 Edema  Assessment Assessed: [name]: No] [Right: No] Edema: [Left: N] [Right: o] Calf Left: Right: Point of Measurement: From Medial Instep 33.9 cm Ankle Left: Right: Point of Measurement: From Medial Instep 22.9 cm Vascular Assessment Pulses: Dorsalis Pedis Palpable: [Right:Yes [id number] Page 2 of 7] Electronic Signature(s) Signed: 08/31/2022 5:44:44 PM By: Micheal Gouty RN, BSN Entered By: [name] on 08/31/2022 13:30:10 -------------------------------------------------------------------------------- Multi Wound Chart Details Patient Name: Date of Service: [name], [place] [name] 08/31/2022 1:15 PM Medical Record Number: [id number] Patient Account Number: [id number] Date of Birth/Sex: Treating RN: Jun 23, 1951 (72 y.o. M) Primary Care [name]: [name] Other Clinician: Referring [name]: Treating [name]/Extender: [name] in Treatment: 38 Vital Signs Height(in): 54 Pulse(bpm): 52 Weight(lbs): 165 Blood Pressure(mmHg): 132/75 Body Mass Index(BMI): 24.4 Temperature(F): 98.0 Respiratory Rate(breaths/min): 20 [1:Photos:] [N/A:N/A] Right Calcaneus N/A N/A Wound Location: Pressure Injury N/A N/A Wounding Event: Pressure Ulcer N/A N/A Primary Etiology: Cataracts, Hypertension, Cirrhosis , N/A N/A Comorbid History: Hepatitis C 07/02/2020 N/A N/A Date Acquired: 57 N/A N/A Weeks of Treatment: Open N/A N/A Wound Status: No N/A N/A Wound Recurrence: 0.7x1.2x2.2 N/A N/A Measurements L x W x D (cm) 0.66 N/A N/A A (cm) : rea 1.451 N/A N/A Volume (cm) : 97.30% N/A N/A % Reduction in A rea: 88.30% N/A N/A % Reduction in Volume: Category/Stage IV N/A N/A Classification: Medium N/A N/A Exudate A mount: Serosanguineous N/A N/A Exudate Type: red, brown N/A N/A Exudate Color: Well defined, not attached N/A N/A Wound Margin: Large (67-100%) N/A N/A Granulation A mount: Red N/A N/A Granulation  Quality: Small (1-33%) N/A N/A Necrotic A mount: Fat Layer (Subcutaneous Tissue): Yes N/A N/A Exposed Structures: Fascia: No Tendon: No Muscle: No Joint: No Bone: No Small (1-33%) N/A N/A Epithelialization: Debridement - Excisional N/A N/A Debridement: Pre-procedure Verification/Time Out 13:40 N/A N/A Taken: Lidocaine 4% Topical Solution N/A N/A Pain Control: Callus, Subcutaneous, Slough N/A N/A Tissue Debrided: Skin/Subcutaneous Tissue N/A N/A Level: 2.25 N/A N/A  Debridement A (sq cm): rea Curette N/A N/A Instrument: Minimum N/A N/A Bleeding: Pressure N/A N/A Hemostasis A chieved: 0 N/A N/A Procedural PainKYLEY [name], [name] ([id number]) [id number] Page 3 of 7 0 N/A N/A Post Procedural Pain: Procedure was tolerated well N/A N/A Debridement Treatment Response: 0.7x1.2x2.2 N/A N/A Post Debridement Measurements L x W x D (cm) 1.451 N/A N/A Post Debridement Volume: (cm) Category/Stage IV N/A N/A Post Debridement Stage: Callus: Yes N/A N/A Periwound Skin Texture: Excoriation: No Induration: No Crepitus: No Rash: No Scarring: No Maceration: No N/A N/A Periwound Skin Moisture: Dry/Scaly: No Atrophie Blanche: No N/A N/A Periwound Skin Color: Cyanosis: No Ecchymosis: No Erythema: No Hemosiderin Staining: No Mottled: No Pallor: No Rubor: No Debridement N/A N/A Procedures Performed: Treatment Notes Electronic Signature(s) Signed: 08/31/2022 1:51:38 PM By: Micheal Maudlin MD FACS Entered By: [name] on 08/31/2022 13:51:38 -------------------------------------------------------------------------------- Multi-Disciplinary Care Plan Details Patient Name: Date of Service: [name], [name] 08/31/2022 1:15 PM Medical Record Number: [id number] Patient Account Number: [id number] Date of Birth/Sex: Treating RN: 03-07-1951 (72 y.o. [name] Primary Care [name]: [name] Other Clinician: Referring  [name]: Treating [name]/Extender: [name] in Treatment: 87 Multidisciplinary Care Plan reviewed with physician Active Inactive Abuse / Safety / Falls / Self Care Management Nursing Diagnoses: History of Falls Impaired physical mobility Potential for falls Potential for injury related to falls Goals: Patient will remain injury free related to falls Date Initiated: 12/08/2021 Date Inactivated: 01/13/2022 Target Resolution Date: 01/05/2022 Goal Status: Met Patient/caregiver will verbalize understanding of skin care regimen Date Initiated: 12/08/2021 Target Resolution Date: 10/31/2022 Goal Status: Active Interventions: Assess fall risk on admission and as needed Assess: immobility, friction, shearing, incontinence upon admission and as needed Assess impairment of mobility on admission and as needed per policy Provide education on fall prevention Notes: Tyriek Hofman, [name] ([id number]) [id number] Page 4 of 7 Nursing Diagnoses: Impaired tissue integrity Knowledge deficit related to ulceration/compromised skin integrity Goals: Patient/caregiver will verbalize understanding of skin care regimen Date Initiated: 12/08/2021 Target Resolution Date: 10/31/2022 Goal Status: Active Ulcer/skin breakdown will have a volume reduction of 30% by week 4 Date Initiated: 12/08/2021 Date Inactivated: 01/13/2022 Target Resolution Date: 01/05/2022 Goal Status: Met Interventions: Assess patient/caregiver ability to perform ulcer/skin care regimen upon admission and as needed Assess ulceration(s) every visit Provide education on ulcer and skin care Treatment Activities: Skin care regimen initiated : 12/08/2021 Topical wound management initiated : 12/08/2021 Notes: Electronic Signature(s) Signed: 08/31/2022 5:44:44 PM By: Micheal Gouty RN, BSN Entered By: [name] on 08/31/2022  13:34:24 -------------------------------------------------------------------------------- Pain Assessment Details Patient Name: Date of Service: [name], [name] 08/31/2022 1:15 PM Medical Record Number: [id number] Patient Account Number: [id number] Date of Birth/Sex: Treating RN: May 21, 1951 (72 y.o. M) Primary Care Jomarion Mish: [name] Other Clinician: Referring Louise Victory: Treating Amberlyn Martinezgarcia/Extender: [name] in Treatment: 38 Active Problems Location of Pain Severity and Description of Pain Patient Has Paino No Site Locations Rate the pain. Current Pain Level: 0 Worst Pain Level: 3 Least Pain Level: 0 Character of Pain Describe the Pain: Aching Pain Management and Medication Current Pain Management: Medication: Yes Is the Current Pain Management Adequate: Adequate KENYA SHIRAISHI, [name] ([id number]) [id number] Page 5 of 7 How does your wound impact your activities of daily livingo Sleep: No Bathing: No Appetite: No Relationship With Others: No Bladder Continence: No Emotions: No Bowel Continence: No Work: No Toileting: No Drive: No Dressing: No Hobbies: No Electronic  Signature(s) Signed: 08/31/2022 5:44:44 PM By: Micheal Gouty RN, BSN Previous Signature: 08/31/2022 1:07:36 PM Version By: [name] Entered By: [name] on 08/31/2022 13:29:46 -------------------------------------------------------------------------------- Patient/Caregiver Education Details Patient Name: Date of Service: [name] N 1/30/2024andnbsp1:15 PM Medical Record Number: [id number] Patient Account Number: [id number] Date of Birth/Gender: Treating RN: [birth year] (72 y.o. [name] Primary Care Physician: [name] Other Clinician: Referring Physician: Treating Physician/Extender: [name] in Treatment: 88 Education Assessment Education Provided To: Patient Education  Topics Provided Pressure: Methods: Explain/Verbal Responses: Reinforcements needed, State content correctly Wound/Skin Impairment: Methods: Explain/Verbal Responses: Reinforcements needed, State content correctly Electronic Signature(s) Signed: 08/31/2022 5:44:44 PM By: Micheal Gouty RN, BSN Entered By: [name] on 08/31/2022 13:34:50 -------------------------------------------------------------------------------- Wound Assessment Details Patient Name: Date of Service: [name], [place] [name] 08/31/2022 1:15 PM Medical Record Number: [id number] Patient Account Number: [id number] Date of Birth/Sex: Treating RN: Dec 18, 1950 (72 y.o. M) Primary Care Hikaru Delorenzo: [name] Other Clinician: Referring Kallyn Demarcus: Treating Davaughn Hillyard/Extender: [name] in Treatment: 38 Wound Status Wound Number: 1 Primary Etiology: Pressure Ulcer Wound Location: Right Calcaneus Wound Status: Open Wounding Event: Pressure Injury Comorbid History: Cataracts, Hypertension, Cirrhosis , Hepatitis C Date Acquired: 07/02/2020 [facility] Of Treatment: [address] [name] ([id number]) [id number] Page 6 of 7 Clustered Wound: No Photos Wound Measurements Length: (cm) 0.7 Width: (cm) 1.2 Depth: (cm) 2.2 Area: (cm) 0.66 Volume: (cm) 1.451 % Reduction in Area: 97.3% % Reduction in Volume: 88.3% Epithelialization: Small (1-33%) Tunneling: No Undermining: No Wound Description Classification: Category/Stage IV Wound Margin: Well defined, not attached Exudate Amount: Medium Exudate Type: Serosanguineous Exudate Color: red, brown Foul Odor After Cleansing: No Slough/Fibrino Yes Wound Bed Granulation Amount: Large (67-100%) Exposed Structure Granulation Quality: Red Fascia Exposed: No Necrotic Amount: Small (1-33%) Fat Layer (Subcutaneous Tissue) Exposed: Yes Necrotic Quality: Adherent Slough Tendon Exposed: No Muscle Exposed: No Joint  Exposed: No Bone Exposed: No Periwound Skin Texture Texture Color No Abnormalities Noted: No No Abnormalities Noted: Yes Callus: Yes Crepitus: No Excoriation: No Induration: No Rash: No Scarring: No Moisture No Abnormalities Noted: Yes Electronic Signature(s) Signed: 08/31/2022 5:44:44 PM By: Micheal Gouty RN, BSN Previous Signature: 08/31/2022 1:07:36 PM Version By: [name] Entered By: [name] on 08/31/2022 13:33:51 -------------------------------------------------------------------------------- Vitals Details Patient Name: Date of Service: [name], [name] 08/31/2022 1:15 PM Medical Record Number: [id number] Patient Account Number: [id number] Date of Birth/Sex: Treating RN: [birth year] (72 y.o. M) Primary Care Sherry Rogus: [name] Other Clinician: Referring Rosaleah Person: Treating Diondra Pines/Extender: [name] in Treatment: [address] [place] ([id number]) [id number] Page 7 of 7 Vital Signs Time Taken: 12:51 Temperature (F): 98.0 Height (in): 69 Pulse (bpm): 59 Weight (lbs): 165 Respiratory Rate (breaths/min): 20 Body Mass Index (BMI): 24.4 Blood Pressure (mmHg): 132/75 Reference Range: 80 - 120 mg / dl Electronic Signature(s) Signed: 08/31/2022 1:07:36 PM By: [name] Entered By: [name] on 08/31/2022 12:51:51

## 2022-09-14 ENCOUNTER — Encounter (HOSPITAL_BASED_OUTPATIENT_CLINIC_OR_DEPARTMENT_OTHER): Payer: 59 | Attending: General Surgery | Admitting: General Surgery

## 2022-09-14 DIAGNOSIS — I10 Essential (primary) hypertension: Secondary | ICD-10-CM | POA: Diagnosis not present

## 2022-09-14 DIAGNOSIS — K7460 Unspecified cirrhosis of liver: Secondary | ICD-10-CM | POA: Insufficient documentation

## 2022-09-14 DIAGNOSIS — B182 Chronic viral hepatitis C: Secondary | ICD-10-CM | POA: Insufficient documentation

## 2022-09-14 DIAGNOSIS — Z8616 Personal history of COVID-19: Secondary | ICD-10-CM | POA: Diagnosis not present

## 2022-09-14 DIAGNOSIS — L89610 Pressure ulcer of right heel, unstageable: Secondary | ICD-10-CM | POA: Insufficient documentation

## 2022-09-14 DIAGNOSIS — L97512 Non-pressure chronic ulcer of other part of right foot with fat layer exposed: Secondary | ICD-10-CM | POA: Insufficient documentation

## 2022-09-14 DIAGNOSIS — Z8673 Personal history of transient ischemic attack (TIA), and cerebral infarction without residual deficits: Secondary | ICD-10-CM | POA: Insufficient documentation

## 2022-09-14 DIAGNOSIS — Z09 Encounter for follow-up examination after completed treatment for conditions other than malignant neoplasm: Secondary | ICD-10-CM | POA: Diagnosis not present

## 2022-09-15 NOTE — Progress Notes (Signed)
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 nd Hematologic/Lymphatic hyperlipidemia Gastrointestinal colon polyps Genitourinary BPH Musculoskeletal arthritis Neurologic stroke Objective Constitutional Slightly bradycardic. no acute distress. Vitals Time Taken: 1:23 AM, Height: 69 in, Weight: 165 lbs, BMI: 24.4, Temperature: 97.7 F, Pulse: 58 bpm, Respiratory Rate: 20 breaths/min, Blood Pressure: 127/74 mmHg. Respiratory Normal work of breathing on room air. General Notes: 09/14/2022: There has been no significant change since his last visit. Integumentary (Hair, Skin) Wound #1 status is Open. Original cause of wound was Pressure Injury. The date acquired was: 07/02/2020. The wound has been in treatment 40 weeks. The wound is located on the Right Calcaneus. The wound measures 0.6cm length x 1.1cm width x 2.3cm depth; 0.518cm^2 area and 1.192cm^3 volume. There is Fat Layer (Subcutaneous Tissue) exposed. There is no tunneling or undermining  noted. There is a medium amount of serosanguineous drainage noted. The wound margin is well defined and not attached to the wound base. There is large (67-100%) red granulation within the wound bed. There is a small (1-33%) [name], [name] ([id number]) [id number] Page 8 of 11 amount of necrotic tissue within the wound bed including Adherent Slough. The periwound skin appearance had no abnormalities noted for moisture. The periwound skin appearance had no abnormalities noted for color. The periwound skin appearance exhibited: Callus. The periwound skin appearance did not exhibit: Crepitus, Excoriation, Induration, Rash, Scarring. Periwound temperature was noted as No Abnormality. The periwound has tenderness on palpation. 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 There was a Selective/Open Wound Skin/Epidermis Debridement with a total area of 1.1 sq cm performed by [name], MD. With the following instrument(s): Curette to remove Non-Viable tissue/material. Material removed includes Eschar, Callus, Slough, and Skin: Epidermis after achieving pain control using Lidocaine 5% topical ointment. No specimens were taken. A time out was conducted at 13:45, prior to the start of the procedure. A Minimum amount of bleeding was controlled with Pressure. The procedure was tolerated well with a pain level of 0 throughout and a pain level of 0 following the procedure. Post Debridement Measurements: 0.6cm length x 1.1cm width x  2.3cm depth; 1.192cm^3 volume. Post debridement Stage noted as Category/Stage IV. Character of Wound/Ulcer Post Debridement is improved. Post procedure Diagnosis Wound #1: Same as Pre-Procedure General Notes: Scribed for Dr. Celine [name] by [name]. RN. Plan Follow-up Appointments: Return Appointment in 2 weeks. - Dr. Celine [name] Room 1 Anesthetic: Wound #1 Right Calcaneus: (In clinic) Topical Lidocaine 4% applied to wound bed Bathing/ Shower/ Hygiene: May shower and wash wound with soap and water. - with dressing changes Off-Loading: Other: - heel offloading sandal, avoid pressure to back  of the heel with pillows under calves to float heels Home Health: New wound care orders this week; continue Home Health for wound care. May utilize formulary equivalent dressing for wound treatment orders unless otherwise specified. - slide hydrofera blue into undermined area Dressing changes to be completed by [place] on Monday / Wednesday / Friday except when patient has scheduled visit at [facility]. Other Home Health Orders/Instructions: - [facility] 3x a week Services and Therapies ordered were: Arterial Studies- Bilateral with ABIs and TBIs - noncompressible ABIs in clinic, nonhealing ulcer right calcaneus WOUND #1: - Calcaneus Wound Laterality: Right Cleanser: Soap and Water 3 x Per Week/30 Days Discharge Instructions: May shower and wash wound with dial antibacterial soap and water prior to dressing change. Cleanser: Wound Cleanser 3 x Per Week/30 Days Discharge Instructions: Cleanse the wound with wound cleanser prior to applying a clean dressing using gauze sponges, not tissue or cotton balls. Prim Dressing: Hydrofera Blue Classic Foam, 2x2 in 3 x Per Week/30 Days ary Discharge Instructions: Moisten with saline prior to applying to wound bed Secondary Dressing: ALLEVYN Heel 4 1/2in x 5 1/2in / 10.5cm x 13.5cm (Generic) 3 x Per Week/30 Days Discharge Instructions: Apply over primary  dressing as directed. Secondary Dressing: Woven Gauze Sponge, Non-Sterile 4x4 in 3 x Per Week/30 Days Discharge Instructions: Apply over primary dressing as directed. Secured With: Elastic Bandage 4 inch (ACE bandage) (Generic) 3 x Per Week/30 Days Discharge Instructions: Secure with ACE bandage as directed. Secured With: [employer], 4.5x3.1 (in/yd) (Generic) 3 x Per Week/30 Days Discharge Instructions: Secure with Kerlix as directed. Secured With: 23M Medipore Public affairs consultant Surgical T 2x10 (in/yd) (Generic) 3 x Per Week/30 Days ape Discharge Instructions: Secure with tape as directed. Add-Ons: foot cover (Generic) 3 x Per Week/30 Days Discharge Instructions: use foot covers 09/14/2022: There has been no significant change to his wound. I used a curette to debride eschar, callus, slough, and some periwound desiccated epithelium from his wound. We will continue to use Hydrofera Blue. Looking back in his chart, we do not have any record of vascular studies. ABIs performed in clinic today were noncompressible. I am going to send him for formal ABIs to see if perhaps he could benefit from improved blood flow to the wound. A referral has been placed. Follow-up with me in 2 weeks. [place], [name] ([id number]) [id number] Page 9 of 11 Electronic Signature(s) Signed: 09/14/2022 2:12:31 PM By: [name] MD FACS Entered By: [name] on 09/14/2022 14:12:31 -------------------------------------------------------------------------------- HxROS Details Patient Name: Date of Service: [name], [name] 09/14/2022 1:15 PM Medical Record Number: [id number] Patient Account Number: [id number] Date of Birth/Sex: Treating RN: 04/23/1951 (72 y.o. M) Primary Care Provider: [name] Other Clinician: Referring Provider: Treating Provider/Extender: Milagros Evener in Treatment: 61 Information Obtained From Patient Eyes Medical  History: Positive for: Cataracts Hematologic/Lymphatic Medical History: Past Medical History Notes: hyperlipidemia Cardiovascular Medical History: Positive for: Hypertension Gastrointestinal Medical History: Positive for: Cirrhosis ; Hepatitis C - says it is gone Past Medical History Notes: colon polyps Genitourinary Medical History: Past Medical History Notes: BPH Musculoskeletal Medical History: Past Medical History Notes: arthritis Neurologic Medical History: Past Medical History Notes: stroke HBO Extended History Items Eyes: Cataracts Immunizations Pneumococcal Vaccine: Received Pneumococcal Vaccination: Yes Received Pneumococcal Vaccination On or After [address] [name], [name] ([id number]) [id number] Page 10 of 11 Implantable Devices None Hospitalization / Surgery History Type of Hospitalization/Surgery inguinal hernia repair knee surgery x4  Family and Social History Cancer: Yes - Mother,Father; Diabetes: Yes - Siblings; Heart Disease: Yes - Maternal Grandparents; Hereditary Spherocytosis: No; Hypertension: Yes - Mother; Kidney Disease: No; Lung Disease: Yes - Mother; Seizures: No; Stroke: Yes - Mother; Thyroid Problems: Yes - Siblings; Tuberculosis: No; Former smoker - ended on 09/25/1982; Marital Status - Single; Alcohol Use: Never; Drug Use: Prior History - cocaine, heroin, marijuana; Caffeine Use: Never; Financial Concerns: No; Food, Clothing or Shelter Needs: No; Support System Lacking: Yes; Transportation Concerns: No Electronic Signature(s) Signed: 09/14/2022 2:18:06 PM By: [name] MD FACS Entered By: [name] on 09/14/2022 14:09:35 -------------------------------------------------------------------------------- SuperBill Details Patient Name: Date of Service: [name], [name] 09/14/2022 Medical Record Number: [id number] Patient Account Number: [id number] Date of Birth/Sex: Treating  RN: [birth year] (72 y.o. M) Primary Care Provider: [name] Other Clinician: Referring Provider: Treating Provider/Extender: Milagros Evener in Treatment: 40 Diagnosis Coding ICD-10 Codes Code Description L89.610 Pressure ulcer of right heel, unstageable Z86.73 Personal history of transient ischemic attack (TIA), and cerebral infarction without residual deficits K74.60 Unspecified cirrhosis of liver B18.2 Chronic viral hepatitis C I63.9 Cerebral infarction, unspecified I10 Essential (primary) hypertension L97.512 Non-pressure chronic ulcer of other part of right foot with fat layer exposed Facility Procedures : CPT4 Code: [id number] Description: [id number] - DEBRIDE WOUND 1ST 20 SQ CM OR < ICD-10 Diagnosis Description L89.610 Pressure ulcer of right heel, unstageable Modifier: Quantity: 1 Physician Procedures : CPT4 Code Description Modifier [id number] - WC PHYS LEVEL 4 - EST PT 25 ICD-10 Diagnosis Description L89.610 Pressure ulcer of right heel, unstageable Z86.73 Personal history of transient ischemic attack (TIA), and cerebral infarction without  residual deficits I63.9 Cerebral infarction, unspecified I10 Essential (primary) hypertension Quantity: 1 : [id number] - WC PHYS DEBR WO ANESTH 20 SQ CM ICD-10 Diagnosis Description L89.610 Pressure ulcer of right heel, unstageable Quantity: 1 Electronic Signature(s) [name], [name] ([id number]) [id number] Page 11 of 11 Signed: 09/14/2022 2:12:52 PM By: [name] MD FACS Entered By: [name] on 09/14/2022 14:12:52

## 2022-09-15 NOTE — Progress Notes (Signed)
[name], [name] ([id number]) [id number] Page 1 of 8 Visit Report for 09/14/2022 Arrival Information Details Patient Name: Date of Service: [name] 09/14/2022 1:15 PM Medical Record Number: [id number] Patient Account Number: [id number] Date of Birth/Sex: Treating RN: Jan 15, 1951 (72 y.o. M) Primary Care Ewart Carrera: [name] Other Clinician: Referring Shantell Belongia: Treating Sherissa Tenenbaum/Extender: Milagros Evener in Treatment: 49 Visit Information History Since Last Visit All ordered tests and consults were completed: No Patient Arrived: Wheel Chair Added or deleted any medications: No Arrival Time: 13:23 Any new allergies or adverse reactions: No Accompanied By: wife Had a fall or experienced change in No Transfer Assistance: None activities of daily living that may affect Patient Identification Verified: Yes risk of falls: Secondary Verification Process Completed: Yes Signs or symptoms of abuse/neglect since last No Patient Requires Transmission-Based Precautions: No visito Patient Has Alerts: Yes Hospitalized since last visit: No Patient Alerts: Patient on Blood Thinner Implantable device outside of the clinic excluding No cellular tissue based products placed in the center since last visit: Has Dressing in Place as Prescribed: Yes Has Footwear/Offloading in Place as Prescribed: Yes Right: Other:globoped heel offloader Pain Present Now: No Electronic Signature(s) Signed: 09/14/2022 5:50:17 PM By: Baruch Gouty RN, BSN Entered By: Baruch Gouty on 09/14/2022 13:32:09 -------------------------------------------------------------------------------- Encounter Discharge Information Details Patient Name: Date of Service: [name], [name] 09/14/2022 1:15 PM Medical Record Number: [id number] Patient Account Number: [id number] Date of Birth/Sex: Treating RN: 21-Oct-1950 (72 y.o. [name] Primary Care  Tacha Manni: [name] Other Clinician: Referring Shaiden Aldous: Treating Eri Mcevers/Extender: Milagros Evener in Treatment: 40 Encounter Discharge Information Items Post Procedure Vitals Discharge Condition: Stable Temperature (F): 97.7 Ambulatory Status: Wheelchair Pulse (bpm): 58 Discharge Destination: Home Respiratory Rate (breaths/min): 18 Transportation: Other Blood Pressure (mmHg): 127/74 Accompanied By: sister Schedule Follow-up Appointment: Yes Clinical Summary of Care: Patient Declined Notes SCAT Electronic Signature(s) Signed: 09/14/2022 5:50:17 PM By: Baruch Gouty RN, BSN Entered By: Baruch Gouty on 09/14/2022 16:20:54 [place] [name] ([id number] Page 2 of 8 -------------------------------------------------------------------------------- Lower Extremity Assessment Details Patient Name: Date of Service: [name] 09/14/2022 1:15 PM Medical Record Number: [id number] Patient Account Number: [id number] Date of Birth/Sex: Treating RN: 1951-04-11 (72 y.o. [name] Primary Care Danyell Awbrey: [name] Other Clinician: Referring Raylon Lamson: Treating Othel Hoogendoorn/Extender: Milagros Evener in Treatment: 40 Edema Assessment Assessed: Shirlyn Goltz: No] [name]: No] Edema: [Left: N] [Right: o] Calf Left: Right: Point of Measurement: From Medial Instep 33.9 cm Ankle Left: Right: Point of Measurement: From Medial Instep 22.9 cm Vascular Assessment Pulses: Dorsalis Pedis Palpable: [Right:Yes] Notes right DP pulse noncompressible Electronic Signature(s) Signed: 09/14/2022 5:50:17 PM By: Baruch Gouty RN, BSN Entered By: Baruch Gouty on 09/14/2022 14:19:42 -------------------------------------------------------------------------------- Multi Wound Chart Details Patient Name: Date of Service: [name], [name] 09/14/2022 1:15 PM Medical Record Number:  [id number] Patient Account Number: [id number] Date of Birth/Sex: Treating RN: 1951-04-10 (72 y.o. M) Primary Care Fabiano Ginley: [name] Other Clinician: Referring Rital Cavey: Treating Chadrick Sprinkle/Extender: Milagros Evener in Treatment: 40 Vital Signs Height(in): 67 Pulse(bpm): 66 Weight(lbs): 165 Blood Pressure(mmHg): 127/74 Body Mass Index(BMI): 24.4 Temperature(F): 97.7 Respiratory Rate(breaths/min): 20 [1:Photos:] [N/A:N/A] Right Calcaneus N/A N/A Wound Location: Pressure Injury N/A N/A Wounding Event: Pressure Ulcer N/A N/A Primary Etiology: Cataracts, Hypertension, Cirrhosis , N/A N/A Comorbid History: Hepatitis C 07/02/2020 N/A N/A Date Acquired: 40 N/A N/A Weeks of Treatment: Open N/A N/A Wound Status:  No N/A N/A Wound Recurrence: 0.6x1.1x2.3 N/A N/A Measurements L x W x D (cm) 0.518 N/A N/A A (cm) : rea 1.192 N/A N/A Volume (cm) : 97.90% N/A N/A % Reduction in A rea: 90.40% N/A N/A % Reduction in Volume: Category/Stage IV N/A N/A Classification: Medium N/A N/A Exudate A mount: Serosanguineous N/A N/A Exudate Type: red, brown N/A N/A Exudate Color: Well defined, not attached N/A N/A Wound Margin: Large (67-100%) N/A N/A Granulation A mount: Red N/A N/A Granulation Quality: Small (1-33%) N/A N/A Necrotic A mount: Fat Layer (Subcutaneous Tissue): Yes N/A N/A Exposed Structures: Fascia: No Tendon: No Muscle: No Joint: No Bone: No Small (1-33%) N/A N/A Epithelialization: Debridement - Selective/Open Wound N/A N/A Debridement: Pre-procedure Verification/Time Out 13:45 N/A N/A Taken: Lidocaine 5% topical ointment N/A N/A Pain Control: Necrotic/Eschar, Callus, Slough N/A N/A Tissue Debrided: Skin/Epidermis N/A N/A Level: 1.1 N/A N/A Debridement A (sq cm): rea Curette N/A N/A Instrument: Minimum N/A N/A Bleeding: Pressure N/A N/A Hemostasis A chieved: 0 N/A N/A Procedural Pain: 0 N/A N/A Post Procedural  Pain: Procedure was tolerated well N/A N/A Debridement Treatment Response: 0.6x1.1x2.3 N/A N/A Post Debridement Measurements L x W x D (cm) 1.192 N/A N/A Post Debridement Volume: (cm) Category/Stage IV N/A N/A Post Debridement Stage: Callus: Yes N/A N/A Periwound Skin Texture: Excoriation: No Induration: No Crepitus: No Rash: No Scarring: No Maceration: No N/A N/A Periwound Skin Moisture: Dry/Scaly: No Atrophie Blanche: No N/A N/A Periwound Skin Color: Cyanosis: No Ecchymosis: No Erythema: No Hemosiderin Staining: No Mottled: No Pallor: No Rubor: No No Abnormality N/A N/A Temperature: Yes N/A N/A Tenderness on Palpation: Debridement N/A N/A Procedures Performed: Treatment Notes Electronic Signature(s) Signed: 09/14/2022 2:08:47 PM By: [name] MD FACS Entered By: [name] on 09/14/2022 14:08:47 [place] [name] ([id number] Page 4 of 8 -------------------------------------------------------------------------------- Multi-Disciplinary Care Plan Details Patient Name: Date of Service: [name] 09/14/2022 1:15 PM Medical Record Number: [id number] Patient Account Number: [id number] Date of Birth/Sex: Treating RN: [birth year] (72 y.o. [name] Primary Care Airam Heidecker: [name] Other Clinician: Referring Kamile Fassler: Treating Khalen Styer/Extender: Milagros Evener in Treatment: 62 Multidisciplinary Care Plan reviewed with physician Active Inactive Abuse / Safety / Falls / Self Care Management Nursing Diagnoses: History of Falls Impaired physical mobility Potential for falls Potential for injury related to falls Goals: Patient will remain injury free related to falls Date Initiated: 12/08/2021 Date Inactivated: 01/13/2022 Target Resolution Date: 01/05/2022 Goal Status: Met Patient/caregiver will verbalize understanding of skin care regimen Date Initiated:  12/08/2021 Target Resolution Date: 10/31/2022 Goal Status: Active Interventions: Assess fall risk on admission and as needed Assess: immobility, friction, shearing, incontinence upon admission and as needed Assess impairment of mobility on admission and as needed per policy Provide education on fall prevention Notes: Wound/Skin Impairment Nursing Diagnoses: Impaired tissue integrity Knowledge deficit related to ulceration/compromised skin integrity Goals: Patient/caregiver will verbalize understanding of skin care regimen Date Initiated: 12/08/2021 Target Resolution Date: 10/31/2022 Goal Status: Active Ulcer/skin breakdown will have a volume reduction of 30% by week 4 Date Initiated: 12/08/2021 Date Inactivated: 01/13/2022 Target Resolution Date: 01/05/2022 Goal Status: Met Interventions: Assess patient/caregiver ability to perform ulcer/skin care regimen upon admission and as needed Assess ulceration(s) every visit Provide education on ulcer and skin care Treatment Activities: Skin care regimen initiated : 12/08/2021 Topical wound management initiated : 12/08/2021 Notes: Electronic Signature(s) Signed: 09/14/2022 5:50:17 PM By: Baruch Gouty RN, BSN Entered By: Baruch Gouty on 09/14/2022 [place], [place] (  [id number] Page 5 of 8 -------------------------------------------------------------------------------- Pain Assessment Details Patient Name: Date of Service: [name] 09/14/2022 1:15 PM Medical Record Number: [id number] Patient Account Number: [id number] Date of Birth/Sex: Treating RN: 1950/09/10 (72 y.o. M) Primary Care Jomo Forand: [name] Other Clinician: Referring Izak Anding: Treating Samarrah Tranchina/Extender: Milagros Evener in Treatment: 40 Active Problems Location of Pain Severity and Description of Pain Patient Has Paino No Site Locations Rate the pain. Current Pain Level: 0 Pain  Management and Medication Current Pain Management: Electronic Signature(s) Signed: 09/14/2022 5:50:17 PM By: Baruch Gouty RN, BSN Entered By: Baruch Gouty on 09/14/2022 13:32:37 -------------------------------------------------------------------------------- Patient/Caregiver Education Details Patient Name: Date of Service: [name] N 2/13/2024andnbsp1:15 PM Medical Record Number: [id number] Patient Account Number: [id number] Date of Birth/Gender: Treating RN: [birth year] (73 y.o. [name] Primary Care Physician: [name] Other Clinician: Referring Physician: Treating Physician/Extender: Milagros Evener in Treatment: 40 Education Assessment Education Provided To: Patient Education Topics Provided Offloading: Methods: Explain/Verbal Responses: Reinforcements needed, State content correctly [place], [name] ([id number]) [id number] Page 6 of 8 Wound/Skin Impairment: Methods: Explain/Verbal Responses: Reinforcements needed, State content correctly Electronic Signature(s) Signed: 09/14/2022 5:50:17 PM By: Baruch Gouty RN, BSN Entered By: Baruch Gouty on 09/14/2022 13:39:36 -------------------------------------------------------------------------------- Wound Assessment Details Patient Name: Date of Service: [name], [place] [name] 09/14/2022 1:15 PM Medical Record Number: [id number] Patient Account Number: [id number] Date of Birth/Sex: Treating RN: Mar 03, 1951 (72 y.o. M) Primary Care Jakera Beaupre: [name] Other Clinician: Referring Boone Gear: Treating Kyrah Schiro/Extender: Milagros Evener in Treatment: 40 Wound Status Wound Number: 1 Primary Etiology: Pressure Ulcer Wound Location: Right Calcaneus Wound Status: Open Wounding Event: Pressure Injury Comorbid History: Cataracts, Hypertension, Cirrhosis , Hepatitis C Date Acquired: 07/02/2020 Weeks Of Treatment:  40 Clustered Wound: No Photos Wound Measurements Length: (cm) 0.6 Width: (cm) 1.1 Depth: (cm) 2.3 Area: (cm) 0.518 Volume: (cm) 1.192 % Reduction in Area: 97.9% % Reduction in Volume: 90.4% Epithelialization: Small (1-33%) Tunneling: No Undermining: No Wound Description Classification: Category/Stage IV Wound Margin: Well defined, not attached Exudate Amount: Medium Exudate Type: Serosanguineous Exudate Color: red, brown Foul Odor After Cleansing: No Slough/Fibrino Yes Wound Bed Granulation Amount: Large (67-100%) Exposed Structure Granulation Quality: Red Fascia Exposed: No Necrotic Amount: Small (1-33%) Fat Layer (Subcutaneous Tissue) Exposed: Yes Necrotic Quality: Adherent Slough Tendon Exposed: No Muscle Exposed: No Joint Exposed: No Bone Exposed: No [address] JERE PIONTEK, [name] ([id number]) [id number] Page 7 of 8 No Abnormalities Noted: No No Abnormalities Noted: Yes Callus: Yes Temperature / Pain Crepitus: No Temperature: No Abnormality Excoriation: No Tenderness on Palpation: Yes Induration: No Rash: No Scarring: No Moisture No Abnormalities Noted: Yes Treatment Notes Wound #1 (Calcaneus) Wound Laterality: Right Cleanser Soap and Water Discharge Instruction: May shower and wash wound with dial antibacterial soap and water prior to dressing change. Wound Cleanser Discharge Instruction: Cleanse the wound with wound cleanser prior to applying a clean dressing using gauze sponges, not tissue or cotton balls. Peri-Wound Care Topical Primary Dressing Hydrofera Blue Classic Foam, 2x2 in Discharge Instruction: Moisten with saline prior to applying to wound bed Secondary Dressing ALLEVYN Heel 4 1/2in x 5 1/2in / 10.5cm x 13.5cm Discharge Instruction: Apply over primary dressing as directed. Woven Gauze Sponge, Non-Sterile 4x4 in Discharge Instruction: Apply over primary dressing as directed. Secured  With Elastic Bandage 4 inch (ACE bandage) Discharge Instruction: Secure with ACE bandage as directed. Kerlix Roll Sterile, 4.5x3.1 (in/yd) Discharge  Instruction: Secure with Kerlix as directed. 14M Medipore Soft Cloth Surgical T 2x10 (in/yd) ape Discharge Instruction: Secure with tape as directed. Compression Wrap Compression Stockings Add-Ons foot cover Discharge Instruction: use foot covers Electronic Signature(s) Signed: 09/14/2022 5:50:17 PM By: Baruch Gouty RN, BSN Entered By: Baruch Gouty on 09/14/2022 13:38:36 -------------------------------------------------------------------------------- [place] Details Patient Name: Date of Service: [name], [name] 09/14/2022 1:15 PM Medical Record Number: [id number] Patient Account Number: [id number] Date of Birth/Sex: Treating RN: June 23, 1951 (72 y.o. M) Primary Care Nathanal Hermiz: [name] Other Clinician: Referring Natnael Biederman: Treating Maxon Kresse/Extender: Milagros Evener in Treatment: 40 Vital Signs Time Taken: 01:23 Temperature (F): 97.7 [place], [place] ([id number]) [id number] Page 8 of 8 Height (in): 69 Pulse (bpm): 58 Weight (lbs): 165 Respiratory Rate (breaths/min): 20 Body Mass Index (BMI): 24.4 Blood Pressure (mmHg): 127/74 Reference Range: 80 - 120 mg / dl Electronic Signature(s) Signed: 09/14/2022 5:50:17 PM By: Baruch Gouty RN, BSN Entered By: Baruch Gouty on 09/14/2022 13:32:28

## 2022-09-28 ENCOUNTER — Encounter (HOSPITAL_BASED_OUTPATIENT_CLINIC_OR_DEPARTMENT_OTHER): Payer: 59 | Admitting: General Surgery

## 2022-09-28 DIAGNOSIS — L89610 Pressure ulcer of right heel, unstageable: Secondary | ICD-10-CM | POA: Diagnosis not present

## 2022-09-30 NOTE — Progress Notes (Signed)
Petrey, [name] ([id number]) [id number] Page 1 of 7 Visit Report for 09/28/2022 Arrival Information Details Patient Name: Date of Service: [name] 09/28/2022 12:45 PM Medical Record Number: [id number] Patient Account Number: [id number] Date of Birth/Sex: Treating RN: 11/24/1950 (72 y.o. [name] Primary Care [name]: [name] Other Clinician: Referring [name]: Treating [name]/Extender: [name] in Treatment: 42 Visit Information History Since Last Visit Added or deleted any medications: No Patient Arrived: Wheel Chair Any new allergies or adverse reactions: No Arrival Time: 12:33 Had a fall or experienced change in No Accompanied By: sister activities of daily living that may affect Transfer Assistance: Manual risk of falls: Patient Identification Verified: Yes Signs or symptoms of abuse/neglect since last visito No Patient Requires Transmission-Based Precautions: No Hospitalized since last visit: No Patient Has Alerts: Yes Implantable device outside of the clinic excluding No Patient Alerts: Patient on Blood Thinner cellular tissue based products placed in the center since last visit: Has Dressing in Place as Prescribed: Yes Pain Present Now: No Electronic Signature(s) Signed: 09/29/2022 8:33:02 AM By: Micheal Catholic RN Entered By: [name] on 09/28/2022 12:33:55 -------------------------------------------------------------------------------- Encounter Discharge Information Details Patient Name: Date of Service: [name], [name] 09/28/2022 12:45 PM Medical Record Number: [id number] Patient Account Number: [id number] Date of Birth/Sex: Treating RN: 05-Feb-1951 (72 y.o. [name] Primary Care [name]: [name] Other Clinician: Referring [name]: Treating [name]/Extender: [name] in Treatment: 75 Encounter Discharge  Information Items Post Procedure Vitals Discharge Condition: Stable Temperature (F): 98.4 Ambulatory Status: Wheelchair Pulse (bpm): 58 Discharge Destination: Home Respiratory Rate (breaths/min): 18 Transportation: Private Auto Blood Pressure (mmHg): 127/75 Accompanied By: sister Schedule Follow-up Appointment: Yes Clinical Summary of Care: Patient Declined Electronic Signature(s) Signed: 09/29/2022 8:33:02 AM By: Micheal Catholic RN Entered By: [name] on 09/29/2022 08:11:19 -------------------------------------------------------------------------------- Lower Extremity Assessment Details Patient Name: Date of Service: [name] RO N 09/28/2022 12:45 PM Medical Record Number: [id number] Patient Account Number: [id number] Date of Birth/Sex: Treating RN: [birth year] (72 y.o. [name] Primary Care Ninnie Fein: [name] Other Clinician: Referring Abdalrahman Clementson: Treating [name]/Extender: [name] in Treatment: 42 [name]: No] [Right: No] H[Left: [name], [name] [phone] [[id number].pdf Page 2 of 7] Edema: [Left: N] [Right: o] Calf Left: Right: Point of Measurement: From Medial Instep 33.9 cm Ankle Left: Right: Point of Measurement: From Medial Instep 22.9 cm Vascular Assessment Pulses: Dorsalis Pedis Palpable: [Right:Yes] Electronic Signature(s) Signed: 09/29/2022 8:33:02 AM By: Micheal Catholic RN Entered By: [name] on 09/28/2022 12:35:36 -------------------------------------------------------------------------------- Multi Wound Chart Details Patient Name: Date of Service: [name], [name] 09/28/2022 12:45 PM Medical Record Number: [id number] Patient Account Number: [id number] Date of Birth/Sex: Treating RN: June 21, 1951 (72 y.o. M) Primary Care [name]: [name] Other Clinician: Referring Koden Hunzeker: Treating Jousha Schwandt/Extender: [name] in Treatment: 42 Vital Signs Height(in): 69 Pulse(bpm): Weight(lbs): 165 Blood Pressure(mmHg): Body Mass Index(BMI): 24.4 Temperature(F): Respiratory Rate(breaths/min): 18 [1:Photos:] [N/A:N/A] Right Calcaneus N/A N/A Wound Location: Pressure Injury N/A N/A Wounding Event: Pressure Ulcer N/A N/A Primary Etiology: Cataracts, Hypertension, Cirrhosis , N/A N/A Comorbid History: Hepatitis C 07/02/2020 N/A N/A Date Acquired: 61 N/A N/A Weeks of Treatment: Open N/A N/A Wound Status: No N/A N/A Wound Recurrence: 0.6x1x2.1 N/A N/A Measurements L x W x D (cm) 0.471 N/A N/A A (cm) : rea 0.99 N/A N/A Volume (cm) : 98.10% N/A N/A %  Reduction in A rea: 92.00% N/A N/A % Reduction in Volume: Category/Stage IV N/A N/A Classification: Medium N/A N/A Exudate A mount: Serosanguineous N/A N/A Exudate Type: red, brown N/A N/A Exudate Color: Well defined, not attached N/A N/A Wound Margin: Small (1-33%) N/A N/A Granulation A mount: Pink N/A N/A Granulation Quality: Large (67-100%) N/A N/A Necrotic A mount: Eschar, Adherent Slough N/A N/A Necrotic Tissue: Fat Layer (Subcutaneous Tissue): Yes N/A N/A Exposed Structures: Fascia: No [name], [name] ([id number]) [id number] Page 3 of 7 Tendon: No Muscle: No Joint: No Bone: No Small (1-33%) N/A N/A Epithelialization: Debridement - Selective/Open Wound N/A N/A Debridement: Pre-procedure Verification/Time Out 12:50 N/A N/A Taken: Lidocaine 4% Topical Solution N/A N/A Pain Control: Slough N/A N/A Tissue Debrided: Non-Viable Tissue N/A N/A Level: 0.6 N/A N/A Debridement A (sq cm): rea Curette N/A N/A Instrument: Minimum N/A N/A Bleeding: Pressure N/A N/A Hemostasis A chieved: 0 N/A N/A Procedural Pain: 0 N/A N/A Post Procedural Pain: Procedure was tolerated well N/A N/A Debridement Treatment Response: 0.6x1x2.1 N/A N/A Post Debridement  Measurements L x W x D (cm) 0.99 N/A N/A Post Debridement Volume: (cm) Category/Stage IV N/A N/A Post Debridement Stage: Callus: Yes N/A N/A Periwound Skin Texture: Excoriation: No Induration: No Crepitus: No Rash: No Scarring: No Maceration: No N/A N/A Periwound Skin Moisture: Dry/Scaly: No Atrophie Blanche: No N/A N/A Periwound Skin Color: Cyanosis: No Ecchymosis: No Erythema: No Hemosiderin Staining: No Mottled: No Pallor: No Rubor: No No Abnormality N/A N/A Temperature: Yes N/A N/A Tenderness on Palpation: Debridement N/A N/A Procedures Performed: Treatment Notes Electronic Signature(s) Signed: 09/28/2022 1:23:52 PM By: [name] MD FACS Entered By: [name] on 09/28/2022 13:23:52 -------------------------------------------------------------------------------- Multi-Disciplinary Care Plan Details Patient Name: Date of Service: [name], [place] [name] 09/28/2022 12:45 PM Medical Record Number: [id number] Patient Account Number: [id number] Date of Birth/Sex: Treating RN: 04/12/1951 (72 y.o. [name] Primary Care Alexanderia Gorby: [name] Other Clinician: Referring Terena Bohan: Treating Kyshon Tolliver/Extender: [name] in Treatment: [place] reviewed with physician Active Inactive Abuse / Safety / Falls / Self Care Management Nursing Diagnoses: History of Falls Impaired physical mobility Potential for falls Potential for injury related to falls Goals: Patient will remain injury free related to falls Date Initiated: 12/08/2021 Date Inactivated: 01/13/2022 Target Resolution Date: 01/05/2022 Goal Status: Met Patient/caregiver will verbalize understanding of skin care regimen Date Initiated: 12/08/2021 Target Resolution Date: 10/31/2022 [name], [name] ([id number]) [id number] Page 4 of 7 Goal Status: Active Interventions: Assess fall risk on admission and as  needed Assess: immobility, friction, shearing, incontinence upon admission and as needed Assess impairment of mobility on admission and as needed per policy Provide education on fall prevention Notes: Wound/Skin Impairment Nursing Diagnoses: Impaired tissue integrity Knowledge deficit related to ulceration/compromised skin integrity Goals: Patient/caregiver will verbalize understanding of skin care regimen Date Initiated: 12/08/2021 Target Resolution Date: 10/31/2022 Goal Status: Active Ulcer/skin breakdown will have a volume reduction of 30% by week 4 Date Initiated: 12/08/2021 Date Inactivated: 01/13/2022 Target Resolution Date: 01/05/2022 Goal Status: Met Interventions: Assess patient/caregiver ability to perform ulcer/skin care regimen upon admission and as needed Assess ulceration(s) every visit Provide education on ulcer and skin care Treatment Activities: Skin care regimen initiated : 12/08/2021 Topical wound management initiated : 12/08/2021 Notes: Electronic Signature(s) Signed: 09/29/2022 8:33:02 AM By: Micheal Catholic RN Entered By: [name] on 09/29/2022 08:10:14 -------------------------------------------------------------------------------- Pain Assessment Details Patient Name: Date of Service: [name], [place] [name] 09/28/2022 12:45 PM Medical Record  Number: [id number] Patient Account Number: [id number] Date of Birth/Sex: Treating RN: 1950-11-15 (72 y.o. [name] Primary Care Avanell Banwart: [name] Other Clinician: Referring Kamarrion Stfort: Treating Denicia Pagliarulo/Extender: [name] in Treatment: 42 Active Problems Location of Pain Severity and Description of Pain Patient Has Paino No Site Locations Pain Management and Medication [name], [name] ([id number]) [id number] Page 5 of 7 Current Pain Management: Electronic Signature(s) Signed: 09/29/2022 8:33:02 AM By: Micheal Catholic RN Entered By: [name] on 09/28/2022 12:35:30 -------------------------------------------------------------------------------- Patient/Caregiver Education Details Patient Name: Date of Service: [name] RO N 2/27/2024andnbsp12:45 PM Medical Record Number: [id number] Patient Account Number: [id number] Date of Birth/Gender: Treating RN: Feb 23, 1951 (72 y.o. [name] Primary Care Physician: [name] Other Clinician: Referring Physician: Treating Physician/Extender: [name] in Treatment: 9 Education Assessment Education Provided To: Patient Education Topics Provided Wound/Skin Impairment: Methods: Explain/Verbal Responses: Return demonstration correctly Electronic Signature(s) Signed: 09/29/2022 8:33:02 AM By: Micheal Catholic RN Entered By: [name] on 09/29/2022 08:10:27 -------------------------------------------------------------------------------- Wound Assessment Details Patient Name: Date of Service: [name] RO N 09/28/2022 12:45 PM Medical Record Number: [id number] Patient Account Number: [id number] Date of Birth/Sex: Treating RN: 06-21-1951 (72 y.o. [name] Primary Care Osmond Steckman: [name] Other Clinician: Referring [name]: Treating Telford Archambeau/Extender: [name] in Treatment: 42 Wound Status Wound Number: 1 Primary Etiology: Pressure Ulcer Wound Location: Right Calcaneus Wound Status: Open Wounding Event: Pressure Injury Comorbid History: Cataracts, Hypertension, Cirrhosis , Hepatitis C Date Acquired: 07/02/2020 Weeks Of Treatment: 42 Clustered Wound: No Photos Wound Measurements Length: (cm) 0.6 Width: (cm) 1 [name], [name] ([id number]) Depth: (cm) 2 Area: (cm) Volume: (cm) % Reduction in Area: 98.1% % Reduction in Volume: 92% [id number] Page 6 of 7 .1 Epithelialization: Small (1-33%) 0.471 Tunneling: No 0.99 Undermining:  No Wound Description Classification: Category/Stage IV Wound Margin: Well defined, not attached Exudate Amount: Medium Exudate Type: Serosanguineous Exudate Color: red, brown Foul Odor After Cleansing: No Slough/Fibrino Yes Wound Bed Granulation Amount: Small (1-33%) Exposed Structure Granulation Quality: Pink Fascia Exposed: No Necrotic Amount: Large (67-100%) Fat Layer (Subcutaneous Tissue) Exposed: Yes Necrotic Quality: Eschar, Adherent Slough Tendon Exposed: No Muscle Exposed: No Joint Exposed: No Bone Exposed: No Periwound Skin Texture Texture Color No Abnormalities Noted: No No Abnormalities Noted: Yes Callus: Yes Temperature / Pain Crepitus: No Temperature: No Abnormality Excoriation: No Tenderness on Palpation: Yes Induration: No Rash: No Scarring: No Moisture No Abnormalities Noted: Yes Treatment Notes Wound #1 (Calcaneus) Wound Laterality: Right Cleanser Soap and Water Discharge Instruction: May shower and wash wound with dial antibacterial soap and water prior to dressing change. Wound Cleanser Discharge Instruction: Cleanse the wound with wound cleanser prior to applying a clean dressing using gauze sponges, not tissue or cotton balls. Peri-Wound Care Topical Primary Dressing Hydrofera Blue Classic Foam, 2x2 in Discharge Instruction: Moisten with saline prior to applying to wound bed Secondary Dressing ALLEVYN Heel 4 1/2in x 5 1/2in / 10.5cm x 13.5cm Discharge Instruction: Apply over primary dressing as directed. Woven Gauze Sponge, Non-Sterile 4x4 in Discharge Instruction: Apply over primary dressing as directed. Secured With Elastic Bandage 4 inch (ACE bandage) Discharge Instruction: Secure with ACE bandage as directed. Kerlix Roll Sterile, 4.5x3.1 (in/yd) Discharge Instruction: Secure with Kerlix as directed. 44M Medipore Soft Cloth Surgical T 2x10 (in/yd) ape Discharge Instruction: Secure with tape as directed. Compression Wrap Compression  Stockings Add-Ons foot cover Discharge Instruction: use foot covers Electronic Signature(s) [name], [name] ([id number]) [id number]  Page 7 of 7 Signed: 09/29/2022 8:33:02 AM By: Micheal Catholic RN Entered By: [name] on 09/28/2022 12:51:25 -------------------------------------------------------------------------------- Vitals Details Patient Name: Date of Service: [name], [place] [name] 09/28/2022 12:45 PM Medical Record Number: [id number] Patient Account Number: [id number] Date of Birth/Sex: Treating RN: 13-May-1951 (72 y.o. [name] Primary Care Aydien Majette: [name] Other Clinician: Referring Elby Blackwelder: Treating Zanetta Dehaan/Extender: [name] in Treatment: 42 Vital Signs Time Taken: 12:35 Respiratory Rate (breaths/min): 18 Height (in): 69 Reference Range: 80 - 120 mg / dl Weight (lbs): 165 Body Mass Index (BMI): 24.4 Electronic Signature(s) Signed: 09/29/2022 8:33:02 AM By: Micheal Catholic RN Entered By: [name] on 09/28/2022 12:35:23

## 2022-09-30 NOTE — Progress Notes (Addendum)
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 2.1cm depth; 0.471cm^2 area and 0.99cm^3 volume. There is Fat Layer (Subcutaneous Tissue) exposed. There is no tunneling or undermining noted. There is a medium amount of serosanguineous drainage noted. The wound margin is well defined and not attached to the wound base. There is small (1-33%) pink granulation within the wound bed. There is a large (67-100%) amount of necrotic tissue within the wound bed including Eschar and Adherent Slough. The periwound skin appearance had no abnormalities noted for moisture. The periwound skin appearance had no abnormalities noted for color. The periwound skin appearance exhibited: Callus. The periwound skin appearance did not exhibit: Crepitus, Excoriation, Induration, Rash, Scarring. Periwound temperature was noted as No Abnormality. The periwound has tenderness on palpation. 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 There was a Excisional Skin/Subcutaneous Tissue Debridement with a total area of 0.6 sq cm performed by [name], MD. With the following instrument(s): Curette to remove Non-Viable tissue/material. Material removed includes Callus, Subcutaneous Tissue, and Slough after achieving pain control using Lidocaine 4% T opical Solution. No specimens were taken. A time out was conducted at 12:50, prior to the start of the procedure. A Minimum amount of bleeding was controlled with Pressure. The procedure was tolerated well with a pain level of 0 throughout and a pain level of 0 following the procedure. Post Debridement Measurements: 0.6cm length x 1cm width x 2.1cm depth; 0.99cm^3 volume. Post debridement Stage noted as Category/Stage IV. Character of Wound/Ulcer Post Debridement is improved. Post procedure Diagnosis Wound #1: Same as Pre-Procedure General Notes: Scribed for Dr. Celine Ahr by J.Scotton. Plan Follow-up Appointments: Return Appointment in 2 weeks. - Dr. Celine Ahr Room 1 Other: - [facility] CONTACTING YOU FOR FOR FORMAL ABI STUDIES Anesthetic: Wound #1 Right Calcaneus: (In clinic) Topical Lidocaine 4% applied to wound bed Bathing/ Shower/ Hygiene: May shower and wash wound with soap and water. - with dressing changes Off-Loading: Other: - heel offloading sandal, avoid pressure to back of the heel with pillows under calves to float heels Home Health: No change in wound care orders this week; continue Home Health for wound care. May utilize formulary equivalent dressing for wound treatment orders  unless otherwise specified. - slide hydrofera blue into undermined area Dressing changes to be completed by [facility] on Monday / Wednesday / Friday except when patient has scheduled visit at [facility]. Other Home Health Orders/Instructions: - [facility] 3x a week WOUND #1: - Calcaneus Wound Laterality: Right Cleanser:  Soap and Water 3 x Per Week/30 Days Discharge Instructions: May shower and wash wound with dial antibacterial soap and water prior to dressing change. Cleanser: Wound Cleanser 3 x Per Week/30 Days Discharge Instructions: Cleanse the wound with wound cleanser prior to applying a clean dressing using gauze sponges, not tissue or cotton balls. Prim Dressing: Hydrofera Blue Classic Foam, 2x2 in 3 x Per Week/30 Days ary Discharge Instructions: Moisten with saline prior to applying to wound bed Secondary Dressing: ALLEVYN Heel 4 1/2in x 5 1/2in / 10.5cm x 13.5cm (Generic) 3 x Per Week/30 Days Discharge Instructions: Apply over primary dressing as directed. Secondary Dressing: Woven Gauze Sponge, Non-Sterile 4x4 in 3 x Per Week/30 Days Discharge Instructions: Apply over primary dressing as directed. Secured With: Elastic Bandage 4 inch (ACE bandage) (Generic) 3 x Per Week/30 Days Discharge Instructions: Secure with ACE bandage as directed. Secured With: [employer], 4.5x3.1 (in/yd) (Generic) 3 x Per Week/30 Days Discharge Instructions: Secure with Kerlix as directed. Secured With: 72M Medipore Public affairs consultant Surgical T 2x10 (in/yd) (Generic) 3 x Per Week/30 Days ape Discharge Instructions: Secure with tape as directed. Add-Ons: foot cover (Generic) 3 x Per Week/30 Days Discharge Instructions: use foot covers 09/28/2022: The undermined portion has contracted somewhat. There is epithelium coming in across the posterior aspect of the heel wound. I used a curette to debride slough, callus, and subcutaneous tissue from his wound. We will continue to pack the undermined  portion of the wound with [facility], as well as apply it to the remaining open portion on the heel. We discussed the importance of the vascular studies with the patient's sister, so hopefully these will get done between now and his next visit. I am concerned that he may have some arterial insufficiency contributing to the poor wound healing on his heel. Follow-up in 2 weeks. Electronic Signature(s) [place], [name] ([id number]) [id number] Page 8 of 9 Signed: 10/01/2022 3:08:51 PM By: Deon Pilling RN, BSN Signed: 10/01/2022 3:35:49 PM By: [name] MD FACS Previous Signature: 09/28/2022 1:28:50 PM Version By: [name] MD FACS Entered By: Deon Pilling on 10/01/2022 15:06:44 -------------------------------------------------------------------------------- HxROS Details Patient Name: Date of Service: [name], [name] 09/28/2022 12:45 PM Medical Record Number: [id number] Patient Account Number: [id number] Date of Birth/Sex: Treating RN: 22-Apr-1951 (72 y.o. M) Primary Care Provider: [name] Other Clinician: Referring Provider: Treating Provider/Extender: Milagros Evener in Treatment: 42 Information Obtained From Patient Eyes Medical History: Positive for: Cataracts Hematologic/Lymphatic Medical History: Past Medical History Notes: hyperlipidemia Cardiovascular Medical History: Positive for: Hypertension Gastrointestinal Medical History: Positive for: Cirrhosis ; Hepatitis C - says it is gone Past Medical History Notes: colon polyps Genitourinary Medical History: Past Medical History Notes: BPH Musculoskeletal Medical History: Past Medical History Notes: arthritis Neurologic Medical History: Past Medical History Notes: stroke HBO Extended History Items Eyes: Cataracts Immunizations Pneumococcal Vaccine: Received Pneumococcal Vaccination: Yes Received Pneumococcal Vaccination On or After  60th Birthday: Yes Implantable Devices None Hospitalization / Surgery History Type of Hospitalization/Surgery inguinal hernia repair [name], [name] ([id number]) [id number] Page 9 of 9 knee surgery x4 Family and Social History Cancer: Yes - Mother,Father; Diabetes: Yes - Siblings; Heart Disease: Yes - Maternal Grandparents; Hereditary Spherocytosis: No; Hypertension: Yes - Mother; Kidney Disease: No; Lung Disease: Yes - Mother; Seizures: No; Stroke: Yes - Mother; Thyroid Problems: Yes - Siblings; Tuberculosis: No; Former smoker - ended on 09/25/1982; Marital Status - Single; Alcohol Use: Never; Drug Use: Prior History - cocaine, heroin,  marijuana; Caffeine Use: Never; Financial Concerns: No; Food, Clothing or Shelter Needs: No; Support System Lacking: Yes; Transportation Concerns: No Electronic Signature(s) Signed: 09/28/2022 1:41:09 PM By: [name] MD FACS Entered By: [name] on 09/28/2022 13:26:02 -------------------------------------------------------------------------------- SuperBill Details Patient Name: Date of Service: [name] 09/28/2022 Medical Record Number: [id number] Patient Account Number: [id number] Date of Birth/Sex: Treating RN: 17-Jan-1951 (72 y.o. M) Primary Care Provider: [name] Other Clinician: Referring Provider: Treating Provider/Extender: Milagros Evener in Treatment: 42 Diagnosis Coding ICD-10 Codes Code Description L89.610 Pressure ulcer of right heel, unstageable Z86.73 Personal history of transient ischemic attack (TIA), and cerebral infarction without residual deficits K74.60 Unspecified cirrhosis of liver B18.2 Chronic viral hepatitis C I63.9 Cerebral infarction, unspecified I10 Essential (primary) hypertension Facility Procedures : CPT4 Code: [id number] Description: [id number] - DEB SUBQ TISSUE 20 SQ CM/< ICD-10 Diagnosis Description L89.610 Pressure ulcer of right  heel, unstageable Modifier: Quantity: 1 Physician Procedures : CPT4 Code Description Modifier [id number] - WC PHYS LEVEL 4 - EST PT 25 ICD-10 Diagnosis Description L89.610 Pressure ulcer of right heel, unstageable Z86.73 Personal history of transient ischemic attack (TIA), and cerebral infarction without  residual deficits K74.60 Unspecified cirrhosis of liver I63.9 Cerebral infarction, unspecified Quantity: 1 : [id number] - WC PHYS SUBQ TISS 20 SQ CM ICD-10 Diagnosis Description L89.610 Pressure ulcer of right heel, unstageable Quantity: 1 Electronic Signature(s) Signed: 09/28/2022 1:29:12 PM By: [name] MD FACS Entered By: [name] on 09/28/2022 13:29:12

## 2022-10-12 ENCOUNTER — Encounter (HOSPITAL_BASED_OUTPATIENT_CLINIC_OR_DEPARTMENT_OTHER): Payer: 59 | Attending: General Surgery | Admitting: General Surgery

## 2022-10-12 ENCOUNTER — Other Ambulatory Visit (HOSPITAL_COMMUNITY): Payer: Self-pay | Admitting: General Surgery

## 2022-10-12 ENCOUNTER — Ambulatory Visit (HOSPITAL_COMMUNITY)
Admission: RE | Admit: 2022-10-12 | Discharge: 2022-10-12 | Disposition: A | Discharge status: Home / Self Care | Payer: 59 | Source: Ambulatory Visit | Attending: Vascular Surgery | Admitting: Vascular Surgery

## 2022-10-12 DIAGNOSIS — L89610 Pressure ulcer of right heel, unstageable: Secondary | ICD-10-CM | POA: Insufficient documentation

## 2022-10-12 DIAGNOSIS — I10 Essential (primary) hypertension: Secondary | ICD-10-CM | POA: Diagnosis not present

## 2022-10-12 DIAGNOSIS — K7460 Unspecified cirrhosis of liver: Secondary | ICD-10-CM | POA: Insufficient documentation

## 2022-10-12 DIAGNOSIS — Z8673 Personal history of transient ischemic attack (TIA), and cerebral infarction without residual deficits: Secondary | ICD-10-CM | POA: Diagnosis not present

## 2022-10-12 DIAGNOSIS — B182 Chronic viral hepatitis C: Secondary | ICD-10-CM | POA: Diagnosis not present

## 2022-10-12 LAB — VAS US ABI WITH/WO TBI

## 2022-10-13 NOTE — Progress Notes (Signed)
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 Cirrhosis , Hepatitis C - says it is gone Hospitalization/Surgery History - inguinal hernia repair. - knee surgery x4. Medical A Surgical History Notes nd Hematologic/Lymphatic hyperlipidemia Gastrointestinal colon polyps Genitourinary BPH Musculoskeletal arthritis Neurologic stroke Objective Constitutional Slightly bradycardic. no acute distress. Vitals Time Taken: 1:20 PM, Height: 69 in, Weight: 165 lbs, BMI: 24.4, Temperature: 97.4 F, Pulse: 58 bpm, Respiratory Rate: 18 breaths/min, Blood Pressure: [place], [name] ([id number]) [id number] Page 7 of 10 136/72 mmHg. Respiratory Normal work of breathing on room air. General Notes: 10/12/2022: The undermined portion has contracted a bit more. There is some callus overlying very thin and tenuous epithelium on the posterior aspect of the heel wound. There is slough accumulation. Integumentary (Hair, Skin) Wound #1 status is Open. Original cause of wound was  Pressure Injury. The date acquired was: 07/02/2020. The wound has been in treatment 44 weeks. The wound is located on the Right Calcaneus. The wound measures 0.8cm length x 2cm width x 1.8cm depth; 1.257cm^2 area and 2.262cm^3 volume. There is Fat Layer (Subcutaneous Tissue) exposed. There is no tunneling or undermining noted. There is a medium amount of serosanguineous drainage noted. The wound margin is well defined and not attached to the wound base. There is large (67-100%) red, pink granulation within the wound bed. There is a small (1-33%) amount of necrotic tissue within the wound bed including Adherent Slough. The periwound skin appearance had no abnormalities noted for moisture. The periwound skin appearance had no abnormalities noted for color. The periwound skin appearance exhibited: Callus. The periwound skin appearance did not exhibit: Crepitus, Excoriation, Induration, Rash, Scarring. Periwound temperature was noted as No Abnormality. The periwound has tenderness on palpation. 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 There was a Excisional Skin/Subcutaneous Tissue Debridement with a total area of 4.5 sq cm performed by [name], MD. With the following instrument(s): Curette to remove Viable and Non-Viable tissue/material. Material removed includes Callus, Subcutaneous Tissue, Slough, and Skin: Epidermis after achieving pain control using Lidocaine 4% T opical Solution. No specimens were taken. A time out was conducted at 13:54, prior to the start of the procedure. A Minimum amount of bleeding was controlled with  Pressure. The procedure was tolerated well with a pain level of 0 throughout and a pain level of 0 following the procedure. Post Debridement Measurements: 0.8cm length x 2.3cm width x 1.8cm depth; 2.601cm^3 volume. Post debridement Stage noted as Category/Stage IV. Character of Wound/Ulcer Post Debridement is stable. Post procedure Diagnosis Wound #1: Same as Pre-Procedure Plan Follow-up Appointments: Return Appointment in 2  weeks. - Dr. Celine Ahr Room 3 Tuesday 3/26/2-24 at 1:30pm Anesthetic: Wound #1 Right Calcaneus: (In clinic) Topical Lidocaine 4% applied to wound bed Bathing/ Shower/ Hygiene: May shower and wash wound with soap and water. - with dressing changes Off-Loading: Other: - heel offloading sandal, avoid pressure to back of the heel with pillows under calves to float heels Home Health: No change in wound care orders this week; continue Home Health for wound care. May utilize formulary equivalent dressing for wound treatment orders unless otherwise specified. - slide hydrofera blue into undermined area Dressing changes to be completed by [facility] on Monday / Wednesday / Friday except when patient has scheduled visit at [facility]. Other Home Health Orders/Instructions: - [facility] 3x a week WOUND #1: - Calcaneus Wound Laterality: Right Cleanser: Soap and Water 3 x Per Week/30 Days Discharge Instructions: May shower and wash wound with dial antibacterial soap and water prior to dressing change. Cleanser: Wound Cleanser 3 x Per Week/30 Days Discharge Instructions: Cleanse the wound with wound cleanser prior to applying a clean dressing using gauze sponges, not tissue or cotton balls. Prim Dressing: Hydrofera Blue Classic Foam, 2x2 in 3 x Per Week/30 Days ary Discharge Instructions: Moisten with saline prior to applying to wound bed Secondary Dressing: ALLEVYN Heel 4 1/2in x 5 1/2in / 10.5cm x 13.5cm (Generic) 3 x Per Week/30 Days Discharge Instructions: Apply over  primary dressing as directed. Secondary Dressing: Woven Gauze Sponge, Non-Sterile 4x4 in 3 x Per Week/30 Days Discharge Instructions: Apply over primary dressing as directed. Secured With: Elastic Bandage 4 inch (ACE bandage) (Generic) 3 x Per Week/30 Days Discharge Instructions: Secure with ACE bandage as directed. Secured With: [employer], 4.5x3.1 (in/yd) (Generic) 3 x Per Week/30 Days Discharge Instructions: Secure with Kerlix as directed. Secured With: 7M Medipore Public affairs consultant Surgical T 2x10 (in/yd) (Generic) 3 x Per Week/30 Days ape [place], [name] ([id number]) [id number] Page 8 of 10 Discharge Instructions: Secure with tape as directed. Add-Ons: foot cover (Generic) 3 x Per Week/30 Days Discharge Instructions: use foot covers 10/12/2022: The undermined portion has contracted a bit more. There is some callus overlying very thin and tenuous epithelium on the posterior aspect of the heel wound. There is slough accumulation. I used a curette to debride slough, callus, and subcutaneous tissue from his wound. We will continue Hydrofera Blue, making sure to get it packed up into the undermined portion of his wound. Continue offloading. He should have adequate blood flow to heal this wound. Follow-up in 2 weeks. Electronic Signature(s) Signed: 10/12/2022 2:13:22 PM By: [name] MD FACS Entered By: [name] on 10/12/2022 14:13:22 -------------------------------------------------------------------------------- HxROS Details Patient Name: Date of Service: [name], [name] 10/12/2022 1:15 PM Medical Record Number: [id number] Patient Account Number: [id number] Date of Birth/Sex: Treating RN: 1950-09-27 (72 y.o. M) Primary Care Provider: [name] Other Clinician: Referring Provider: Treating Provider/Extender: Milagros Evener in Treatment: 85 Information Obtained From Patient Eyes Medical  History: Positive for: Cataracts Hematologic/Lymphatic Medical History: Past Medical History Notes: hyperlipidemia Cardiovascular Medical History: Positive for: Hypertension Gastrointestinal Medical History: Positive for: Cirrhosis ; Hepatitis C - says it is gone Past Medical History Notes: colon polyps Genitourinary Medical History: Past Medical History Notes: BPH Musculoskeletal Medical History: Past Medical History Notes: arthritis Neurologic Medical History: Past Medical History Notes: stroke HBO Extended History Items Eyes: Cataracts [name], [name] ([id number]) [id number] Page 9 of 10 Immunizations Pneumococcal Vaccine: Received Pneumococcal Vaccination: Yes Received Pneumococcal Vaccination  On or After 60th Birthday: Yes Implantable Devices None Hospitalization / Surgery History Type of Hospitalization/Surgery inguinal hernia repair knee surgery x4 Family and Social History Cancer: Yes - Mother,Father; Diabetes: Yes - Siblings; Heart Disease: Yes - Maternal Grandparents; Hereditary Spherocytosis: No; Hypertension: Yes - Mother; Kidney Disease: No; Lung Disease: Yes - Mother; Seizures: No; Stroke: Yes - Mother; Thyroid Problems: Yes - Siblings; Tuberculosis: No; Former smoker - ended on 09/25/1982; Marital Status - Single; Alcohol Use: Never; Drug Use: Prior History - cocaine, heroin, marijuana; Caffeine Use: Never; Financial Concerns: No; Food, Clothing or Shelter Needs: No; Support System Lacking: Yes; Transportation Concerns: No Electronic Signature(s) Signed: 10/12/2022 2:28:28 PM By: [name] MD FACS Entered By: [name] on 10/12/2022 14:11:23 -------------------------------------------------------------------------------- SuperBill Details Patient Name: Date of Service: [name], [name] 10/12/2022 Medical Record Number: [id number] Patient Account Number: [id number] Date of Birth/Sex: Treating  RN: [birth year] (72 y.o. M) Primary Care Provider: [name] Other Clinician: Referring Provider: Treating Provider/Extender: Milagros Evener in Treatment: 44 Diagnosis Coding ICD-10 Codes Code Description L89.610 Pressure ulcer of right heel, unstageable Z86.73 Personal history of transient ischemic attack (TIA), and cerebral infarction without residual deficits K74.60 Unspecified cirrhosis of liver B18.2 Chronic viral hepatitis C I63.9 Cerebral infarction, unspecified I10 Essential (primary) hypertension Facility Procedures : CPT4 Code: [id number] Description: [id number] - DEB SUBQ TISSUE 20 SQ CM/< ICD-10 Diagnosis Description L89.610 Pressure ulcer of right heel, unstageable Modifier: Quantity: 1 Physician Procedures : CPT4 Code Description Modifier [id number] - WC PHYS LEVEL 4 - EST PT 25 ICD-10 Diagnosis Description L89.610 Pressure ulcer of right heel, unstageable Z86.73 Personal history of transient ischemic attack (TIA), and cerebral infarction without  residual deficits I10 Essential (primary) hypertension K74.60 Unspecified cirrhosis of liver Quantity: 1 : [id number] - WC PHYS SUBQ TISS 20 SQ CM ICD-10 Diagnosis Description L89.610 Pressure ulcer of right heel, unstageable Quantity: 1 Electronic Signature(s) [name], [name] ([id number]) [id number] Page 10 of 10 Signed: 10/12/2022 2:13:45 PM By: [name] MD FACS Entered By: [name] on 10/12/2022 14:13:45

## 2022-10-26 ENCOUNTER — Ambulatory Visit (HOSPITAL_BASED_OUTPATIENT_CLINIC_OR_DEPARTMENT_OTHER): Payer: 59 | Admitting: General Surgery

## 2022-11-02 ENCOUNTER — Encounter (HOSPITAL_BASED_OUTPATIENT_CLINIC_OR_DEPARTMENT_OTHER): Payer: 59 | Attending: General Surgery | Admitting: General Surgery

## 2022-11-02 DIAGNOSIS — Z8616 Personal history of COVID-19: Secondary | ICD-10-CM | POA: Diagnosis not present

## 2022-11-02 DIAGNOSIS — Z87891 Personal history of nicotine dependence: Secondary | ICD-10-CM | POA: Diagnosis not present

## 2022-11-02 DIAGNOSIS — K7460 Unspecified cirrhosis of liver: Secondary | ICD-10-CM | POA: Insufficient documentation

## 2022-11-02 DIAGNOSIS — Z8673 Personal history of transient ischemic attack (TIA), and cerebral infarction without residual deficits: Secondary | ICD-10-CM | POA: Insufficient documentation

## 2022-11-02 DIAGNOSIS — B182 Chronic viral hepatitis C: Secondary | ICD-10-CM | POA: Insufficient documentation

## 2022-11-02 DIAGNOSIS — L89610 Pressure ulcer of right heel, unstageable: Secondary | ICD-10-CM | POA: Insufficient documentation

## 2022-11-02 DIAGNOSIS — I10 Essential (primary) hypertension: Secondary | ICD-10-CM | POA: Insufficient documentation

## 2022-11-02 NOTE — Progress Notes (Signed)
[place], [name] ([id number]) [id number] Page 1 of 7 Visit Report for 11/02/2022 Arrival Information Details Patient Name: Date of Service: [name] 11/02/2022 12:45 PM Medical Record Number: [id number] Patient Account Number: [id number] Date of Birth/Sex: Treating RN: November 04, 1950 (72 y.o. M) Primary Care Florie Carico: [name] Other Clinician: Referring Seraj Dunnam: Treating Jaymeson Mengel/Extender: Milagros Evener in Treatment: 70 Visit Information History Since Last Visit All ordered tests and consults were completed: No Patient Arrived: Wheel Chair Added or deleted any medications: No Arrival Time: 12:52 Any new allergies or adverse reactions: No Accompanied By: sister Had a fall or experienced change in No Transfer Assistance: None activities of daily living that may affect Patient Identification Verified: Yes risk of falls: Secondary Verification Process Completed: Yes Signs or symptoms of abuse/neglect since last visito No Patient Requires Transmission-Based Precautions: No Hospitalized since last visit: No Patient Has Alerts: Yes Implantable device outside of the clinic excluding No Patient Alerts: Patient on Blood Thinner cellular tissue based products placed in the center since last visit: Pain Present Now: No Electronic Signature(s) Signed: 11/02/2022 2:36:37 PM By: Worthy Rancher Entered By: Worthy Rancher on 11/02/2022 12:52:41 -------------------------------------------------------------------------------- Encounter Discharge Information Details Patient Name: Date of Service: [name], [place] [name] 11/02/2022 12:45 PM Medical Record Number: [id number] Patient Account Number: [id number] Date of Birth/Sex: Treating RN: September 26, 1950 (72 y.o. [name] Primary Care Armanii Pressnell: [name] Other Clinician: Referring Camari Quintanilla: Treating Kamaria Lucia/Extender: Milagros Evener in Treatment:  38 Encounter Discharge Information Items Post Procedure Vitals Discharge Condition: Stable Temperature (F): 98.3 Ambulatory Status: Wheelchair Pulse (bpm): 56 Discharge Destination: Home Respiratory Rate (breaths/min): 18 Transportation: Private Auto Blood Pressure (mmHg): 145/79 Accompanied By: sister Schedule Follow-up Appointment: Yes Clinical Summary of Care: Patient Declined Electronic Signature(s) Signed: 11/02/2022 1:49:57 PM By: Dellie Catholic RN Entered By: Dellie Catholic on 11/02/2022 13:45:40 -------------------------------------------------------------------------------- Lower Extremity Assessment Details Patient Name: Date of Service: [name] RO N 11/02/2022 12:45 PM Medical Record Number: [id number] Patient Account Number: [id number] Date of Birth/Sex: Treating RN: 02-08-1951 (72 y.o. [name] Primary Care Wake Conlee: [name] Other Clinician: Referring Jaxsen Bernhart: Treating Kai Calico/Extender: Milagros Evener in Treatment: 47 Edema Assessment Assessed: Shirlyn Goltz: No] [Right: No] H[Left: [name], [name] [phone] Patrice [id number].pdf Page 2 of 7] Edema: [Left: N] [Right: o] Calf Left: Right: Point of Measurement: From Medial Instep 33.5 cm Ankle Left: Right: Point of Measurement: From Medial Instep 22.9 cm Vascular Assessment Pulses: Dorsalis Pedis Palpable: [Right:Yes] Electronic Signature(s) Signed: 11/02/2022 1:49:57 PM By: Dellie Catholic RN Entered By: Dellie Catholic on 11/02/2022 13:15:11 -------------------------------------------------------------------------------- Multi Wound Chart Details Patient Name: Date of Service: [name], [name] 11/02/2022 12:45 PM Medical Record Number: [id number] Patient Account Number: [id number] Date of Birth/Sex: Treating RN: January 08, 1951 (72 y.o. M) Primary Care Artesha Wemhoff: [name] Other Clinician: Referring Belmont Valli: Treating  Cing [place]/Extender: Milagros Evener in Treatment: 3 Vital Signs Height(in): 69 Pulse(bpm): 52 Weight(lbs): 165 Blood Pressure(mmHg): 145/79 Body Mass Index(BMI): 24.4 Temperature(F): 98.3 Respiratory Rate(breaths/min): 18 [1:Photos:] [N/A:N/A] Right Calcaneus N/A N/A Wound Location: Pressure Injury N/A N/A Wounding Event: Pressure Ulcer N/A N/A Primary Etiology: Cataracts, Hypertension, Cirrhosis , N/A N/A Comorbid History: Hepatitis C 07/02/2020 N/A N/A Date Acquired: 48 N/A N/A Weeks of Treatment: Open N/A N/A Wound Status: No N/A N/A Wound Recurrence: 0.8x2x1.5 N/A N/A Measurements L x W x D (cm) 1.257 N/A N/A A (cm) : rea 1.885 N/A N/A Volume (  cm) : 94.90% N/A N/A % Reduction in A rea: 84.80% N/A N/A % Reduction in Volume: Category/Stage IV N/A N/A Classification: Medium N/A N/A Exudate A mount: Serosanguineous N/A N/A Exudate Type: red, brown N/A N/A Exudate Color: Well defined, not attached N/A N/A Wound Margin: Large (67-100%) N/A N/A Granulation A mount: Red, Pink N/A N/A Granulation Quality: Small (1-33%) N/A N/A Necrotic A mount: Fat Layer (Subcutaneous Tissue): Yes N/A N/A Exposed Structures: Fascia: No Tendon: No [name], [name] ([id number]) [id number] Page 3 of 7 Muscle: No Joint: No Bone: No Small (1-33%) N/A N/A Epithelialization: Debridement - Selective/Open Wound N/A N/A Debridement: Pre-procedure Verification/Time Out 13:20 N/A N/A Taken: Lidocaine 4% Topical Solution N/A N/A Pain Control: Necrotic/Eschar, Slough N/A N/A Tissue Debrided: Non-Viable Tissue N/A N/A Level: 1.6 N/A N/A Debridement A (sq cm): rea Curette N/A N/A Instrument: Minimum N/A N/A Bleeding: Pressure N/A N/A Hemostasis A chieved: 0 N/A N/A Procedural Pain: 0 N/A N/A Post Procedural Pain: Procedure was tolerated well N/A N/A Debridement Treatment Response: 0.8x2x1.5 N/A N/A Post  Debridement Measurements L x W x D (cm) 1.885 N/A N/A Post Debridement Volume: (cm) Category/Stage IV N/A N/A Post Debridement Stage: Callus: Yes N/A N/A Periwound Skin Texture: Excoriation: No Induration: No Crepitus: No Rash: No Scarring: No Maceration: No N/A N/A Periwound Skin Moisture: Dry/Scaly: No Atrophie Blanche: No N/A N/A Periwound Skin Color: Cyanosis: No Ecchymosis: No Erythema: No Hemosiderin Staining: No Mottled: No Pallor: No Rubor: No No Abnormality N/A N/A Temperature: Yes N/A N/A Tenderness on Palpation: Debridement N/A N/A Procedures Performed: Treatment Notes Wound #1 (Calcaneus) Wound Laterality: Right Cleanser Soap and Water Discharge Instruction: May shower and wash wound with dial antibacterial soap and water prior to dressing change. Wound Cleanser Discharge Instruction: Cleanse the wound with wound cleanser prior to applying a clean dressing using gauze sponges, not tissue or cotton balls. Peri-Wound Care Topical Primary Dressing Hydrofera Blue Ready Transfer Foam, 4x5 (in/in) Discharge Instruction: Apply to wound bed as instructed Secondary Dressing ALLEVYN Heel 4 1/2in x 5 1/2in / 10.5cm x 13.5cm Discharge Instruction: Apply over primary dressing as directed. Woven Gauze Sponge, Non-Sterile 4x4 in Discharge Instruction: Apply over primary dressing as directed. Secured With Elastic Bandage 4 inch (ACE bandage) Discharge Instruction: Secure with ACE bandage as directed. Kerlix Roll Sterile, 4.5x3.1 (in/yd) Discharge Instruction: Secure with Kerlix as directed. 24M Medipore Soft Cloth Surgical T 2x10 (in/yd) ape Discharge Instruction: Secure with tape as directed. Compression Wrap Compression Stockings Add-Ons foot cover Discharge Instruction: use foot covers [place], [name] ([id number]) [id number] Page 4 of 7 Electronic Signature(s) Signed: 11/02/2022 1:46:48 PM By: [name] MD FACS Entered  By: [name] on 11/02/2022 13:46:48 -------------------------------------------------------------------------------- Multi-Disciplinary Care Plan Details Patient Name: Date of Service: [name], [place] [name] 11/02/2022 12:45 PM Medical Record Number: [id number] Patient Account Number: [id number] Date of Birth/Sex: Treating RN: [birth year] (72 y.o. [name] Primary Care Briggitte Boline: [name] Other Clinician: Referring Ashaun Gaughan: Treating Zarai Orsborn/Extender: Milagros Evener in Treatment: 67 Multidisciplinary Care Plan reviewed with physician Active Inactive Abuse / Safety / Falls / Self Care Management Nursing Diagnoses: History of Falls Impaired physical mobility Potential for falls Potential for injury related to falls Goals: Patient will remain injury free related to falls Date Initiated: 12/08/2021 Date Inactivated: 01/13/2022 Target Resolution Date: 01/05/2022 Goal Status: Met Patient/caregiver will verbalize understanding of skin care regimen Date Initiated: 12/08/2021 Target Resolution Date: 01/30/2023 Goal Status: Active Interventions: Assess fall risk on admission and as needed  Assess: immobility, friction, shearing, incontinence upon admission and as needed Assess impairment of mobility on admission and as needed per policy Provide education on fall prevention Notes: Wound/Skin Impairment Nursing Diagnoses: Impaired tissue integrity Knowledge deficit related to ulceration/compromised skin integrity Goals: Patient/caregiver will verbalize understanding of skin care regimen Date Initiated: 12/08/2021 Target Resolution Date: 01/30/2023 Goal Status: Active Ulcer/skin breakdown will have a volume reduction of 30% by week 4 Date Initiated: 12/08/2021 Date Inactivated: 01/13/2022 Target Resolution Date: 01/05/2022 Goal Status: Met Interventions: Assess patient/caregiver ability to perform ulcer/skin care regimen upon admission and as  needed Assess ulceration(s) every visit Provide education on ulcer and skin care Treatment Activities: Skin care regimen initiated : 12/08/2021 Topical wound management initiated : 12/08/2021 Notes: Electronic Signature(s) [place], [name] ([id number]) [id number] Page 5 of 7 Signed: 11/02/2022 1:49:57 PM By: Dellie Catholic RN Entered By: Dellie Catholic on 11/02/2022 13:44:31 -------------------------------------------------------------------------------- Pain Assessment Details Patient Name: Date of Service: [name] RO N 11/02/2022 12:45 PM Medical Record Number: [id number] Patient Account Number: [id number] Date of Birth/Sex: Treating RN: 11-Oct-1950 (72 y.o. M) Primary Care Jaria Conway: [name] Other Clinician: Referring Rishav Rockefeller: Treating Murna Backer/Extender: Milagros Evener in Treatment: 69 Active Problems Location of Pain Severity and Description of Pain Patient Has Paino No Site Locations Pain Management and Medication Current Pain Management: Electronic Signature(s) Signed: 11/02/2022 2:36:37 PM By: Worthy Rancher Entered By: Worthy Rancher on 11/02/2022 12:53:20 -------------------------------------------------------------------------------- Patient/Caregiver Education Details Patient Name: Date of Service: [name] RO N 4/2/2024andnbsp12:45 PM Medical Record Number: [id number] Patient Account Number: [id number] Date of Birth/Gender: Treating RN: 1951/02/27 (72 y.o. [name] Primary Care Physician: [name] Other Clinician: Referring Physician: Treating Physician/Extender: Milagros Evener in Treatment: 21 Education Assessment Education Provided To: Patient Education Topics Provided Wound/Skin Impairment: Methods: Explain/Verbal Responses: Return demonstration correctly Electronic Signature(s) Signed: 11/02/2022 1:49:57 PM By: Dellie Catholic RN [name], [place]  ([id number]) [id number] Page 6 of 7 Entered By: Dellie Catholic on 11/02/2022 13:43:18 -------------------------------------------------------------------------------- Wound Assessment Details Patient Name: Date of Service: [name] 11/02/2022 12:45 PM Medical Record Number: [id number] Patient Account Number: [id number] Date of Birth/Sex: Treating RN: 1951-04-17 (72 y.o. M) Primary Care Camber Ninh: [name] Other Clinician: Referring Akeela Busk: Treating Emanuelle Bastos/Extender: Milagros Evener in Treatment: 47 Wound Status Wound Number: 1 Primary Etiology: Pressure Ulcer Wound Location: Right Calcaneus Wound Status: Open Wounding Event: Pressure Injury Comorbid History: Cataracts, Hypertension, Cirrhosis , Hepatitis C Date Acquired: 07/02/2020 Weeks Of Treatment: 47 Clustered Wound: No Photos Wound Measurements Length: (cm) 0.8 Width: (cm) 2 Depth: (cm) 1.5 Area: (cm) 1.257 Volume: (cm) 1.885 % Reduction in Area: 94.9% % Reduction in Volume: 84.8% Epithelialization: Small (1-33%) Tunneling: No Undermining: No Wound Description Classification: Category/Stage IV Wound Margin: Well defined, not attached Exudate Amount: Medium Exudate Type: Serosanguineous Exudate Color: red, brown Foul Odor After Cleansing: No Slough/Fibrino Yes Wound Bed Granulation Amount: Large (67-100%) Exposed Structure Granulation Quality: Red, Pink Fascia Exposed: No Necrotic Amount: Small (1-33%) Fat Layer (Subcutaneous Tissue) Exposed: Yes Necrotic Quality: Adherent Slough Tendon Exposed: No Muscle Exposed: No Joint Exposed: No Bone Exposed: No Periwound Skin Texture Texture Color No Abnormalities Noted: No No Abnormalities Noted: Yes Callus: Yes Temperature / Pain Crepitus: No Temperature: No Abnormality Excoriation: No Tenderness on Palpation: Yes Induration: No Rash: No Scarring: No Moisture No Abnormalities Noted:  Yes Treatment Notes UDELL, [facility] ([id number]) [id number] Page 7 of 7 Wound #1 (Calcaneus) Wound Laterality:  Right Cleanser Soap and Water Discharge Instruction: May shower and wash wound with dial antibacterial soap and water prior to dressing change. Wound Cleanser Discharge Instruction: Cleanse the wound with wound cleanser prior to applying a clean dressing using gauze sponges, not tissue or cotton balls. Peri-Wound Care Topical Primary Dressing Hydrofera Blue Ready Transfer Foam, 4x5 (in/in) Discharge Instruction: Apply to wound bed as instructed Secondary Dressing ALLEVYN Heel 4 1/2in x 5 1/2in / 10.5cm x 13.5cm Discharge Instruction: Apply over primary dressing as directed. Woven Gauze Sponge, Non-Sterile 4x4 in Discharge Instruction: Apply over primary dressing as directed. Secured With Elastic Bandage 4 inch (ACE bandage) Discharge Instruction: Secure with ACE bandage as directed. Kerlix Roll Sterile, 4.5x3.1 (in/yd) Discharge Instruction: Secure with Kerlix as directed. 25M Medipore Soft Cloth Surgical T 2x10 (in/yd) ape Discharge Instruction: Secure with tape as directed. Compression Wrap Compression Stockings Add-Ons foot cover Discharge Instruction: use foot covers Electronic Signature(s) Signed: 11/02/2022 1:49:57 PM By: Dellie Catholic RN Previous Signature: 11/02/2022 1:02:24 PM Version By: Sandre Kitty Entered By: Dellie Catholic on 11/02/2022 13:15:40 -------------------------------------------------------------------------------- Vitals Details Patient Name: Date of Service: [name], [name] 11/02/2022 12:45 PM Medical Record Number: [id number] Patient Account Number: [id number] Date of Birth/Sex: Treating RN: November 13, 1950 (72 y.o. M) Primary Care Champayne Kocian: [name] Other Clinician: Referring Verner Kopischke: Treating Kaylyn Garrow/Extender: Milagros Evener in Treatment: 18 Vital Signs Time Taken:  12:52 Temperature (F): 98.3 Height (in): 69 Pulse (bpm): 56 Weight (lbs): 165 Respiratory Rate (breaths/min): 18 Body Mass Index (BMI): 24.4 Blood Pressure (mmHg): 145/79 Reference Range: 80 - 120 mg / dl Electronic Signature(s) Signed: 11/02/2022 2:36:37 PM By: Worthy Rancher Entered By: Worthy Rancher on 11/02/2022 12:53:14

## 2022-11-02 NOTE — Progress Notes (Signed)
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 No family history of Hereditary Spherocytosis, Kidney Disease, Seizures, Tuberculosis. 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 Medical History Eyes Patient has history of Cataracts Cardiovascular Patient has history of Hypertension Gastrointestinal Patient has history of Cirrhosis , Hepatitis C - says it is gone Hospitalization/Surgery History - inguinal hernia repair. - knee surgery x4. Medical A Surgical History Notes nd Hematologic/Lymphatic hyperlipidemia Gastrointestinal colon polyps Genitourinary BPH Musculoskeletal arthritis Neurologic stroke [name], [name] ([id number]) [id number] Page 7 of 10 Objective Constitutional Slightly hypertensive. Slightly bradycardic. no acute distress. Vitals Time Taken: 12:52 PM, Height: 69 in, Weight: 165 lbs, BMI: 24.4, Temperature: 98.3 F, Pulse: 56 bpm, Respiratory Rate: 18 breaths/min,  Blood Pressure: 145/79 mmHg. Respiratory Normal work of breathing on room air. General Notes: 11/02/2022: His wound has continued to contract. There is some slough and senescent skin accumulation around the margins of the wound. The undermined portion has come in a bit further. Integumentary (Hair, Skin) Wound #1 status is Open. Original cause of wound was Pressure Injury. The date acquired was: 07/02/2020. The wound has been in treatment 47 weeks. The wound is located on the Right Calcaneus. The wound measures 0.8cm length x 2cm width x 1.5cm depth; 1.257cm^2 area and 1.885cm^3 volume. There is Fat Layer (Subcutaneous Tissue) exposed. There is no tunneling or undermining noted. There is a medium amount of serosanguineous drainage noted. The wound margin is well defined and not attached to the wound base. There is large (67-100%) red, pink granulation within the wound bed. There is a small (1-33%) amount of necrotic tissue within the wound bed including Adherent Slough. The periwound skin appearance had no abnormalities noted for moisture. The periwound skin appearance had no abnormalities noted for color. The periwound skin appearance exhibited: Callus. The periwound skin appearance did not exhibit: Crepitus, Excoriation, Induration, Rash, Scarring. Periwound temperature was noted as No Abnormality. The periwound has tenderness on palpation. 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 There was a Selective/Open Wound Non-Viable Tissue Debridement with a total area of 1.6 sq cm performed by [name], MD. With the following  instrument(s): Curette to remove Non-Viable tissue/material. Material removed includes Eschar and Slough and after achieving pain control using Lidocaine 4% T opical Solution. No specimens were taken. A time out was conducted at 13:20, prior to the start of the procedure. A Minimum amount of bleeding was controlled with Pressure. The procedure was tolerated well with a pain  level of 0 throughout and a pain level of 0 following the procedure. Post Debridement Measurements: 0.8cm length x 2cm width x 1.5cm depth; 1.885cm^3 volume. Post debridement Stage noted as Category/Stage IV. Character of Wound/Ulcer Post Debridement is improved. Post procedure Diagnosis Wound #1: Same as Pre-Procedure General Notes: Scribed for Dr. Celine Ahr by J.Scotton. Plan Follow-up Appointments: Return Appointment in 2 weeks. - Dr. Celine Ahr Room 3 Anesthetic: Wound #1 Right Calcaneus: (In clinic) Topical Lidocaine 4% applied to wound bed Bathing/ Shower/ Hygiene: May shower and wash wound with soap and water. - with dressing changes Off-Loading: Other: - heel offloading sandal, avoid pressure to back of the heel with pillows under calves to float heels Home Health: No change in wound care orders this week; continue Home Health for wound care. May utilize formulary equivalent dressing for wound treatment orders unless otherwise specified. - slide hydrofera blue into undermined area Dressing changes to be completed by [facility] on Monday / Wednesday / Friday except when patient has scheduled visit at [facility]. Other Home Health Orders/Instructions: - [facility] 3x a week WOUND #1: - Calcaneus Wound Laterality: Right Cleanser: Soap and Water 3 x Per Week/30 Days Discharge Instructions: May shower and wash wound with dial antibacterial soap and water prior to dressing change. Cleanser: Wound Cleanser 3 x Per Week/30 Days Discharge Instructions: Cleanse the wound with wound cleanser prior to applying a clean  dressing using gauze sponges, not tissue or cotton balls. Prim Dressing: Hydrofera Blue Ready Transfer Foam, 4x5 (in/in) 3 x Per Week/30 Days [name], [name] ([id number]) [id number] Page 8 of 10 Discharge Instructions: Apply to wound bed as instructed Secondary Dressing: ALLEVYN Heel 4 1/2in x 5 1/2in / 10.5cm x 13.5cm (Generic) 3 x Per Week/30 Days Discharge Instructions: Apply over primary dressing as directed. Secondary Dressing: Woven Gauze Sponge, Non-Sterile 4x4 in 3 x Per Week/30 Days Discharge Instructions: Apply over primary dressing as directed. Secured With: Elastic Bandage 4 inch (ACE bandage) (Generic) 3 x Per Week/30 Days Discharge Instructions: Secure with ACE bandage as directed. Secured With: [employer], 4.5x3.1 (in/yd) (Generic) 3 x Per Week/30 Days Discharge Instructions: Secure with Kerlix as directed. Secured With: 94M Medipore Public affairs consultant Surgical T 2x10 (in/yd) (Generic) 3 x Per Week/30 Days ape Discharge Instructions: Secure with tape as directed. Add-Ons: foot cover (Generic) 3 x Per Week/30 Days Discharge Instructions: use foot covers 11/02/2022: His wound has continued to contract. There is some slough and senescent skin accumulation around the margins of the wound. The undermined portion has come in a bit further. I used a curette to debride slough and senescent skin from his wound. We will continue to pack Hydrofera Blue into the undermined portion of his wound as well as apply it to the exposed surface. Continue heel cup and heel offloading shoe. Follow-up in 2 weeks. Electronic Signature(s) Signed: 11/02/2022 1:49:25 PM By: [name] MD FACS Entered By: [name] on 11/02/2022 13:49:25 -------------------------------------------------------------------------------- HxROS Details Patient Name: Date of Service: [name], [name] 11/02/2022 12:45 PM Medical Record Number: [id number] Patient Account  Number: [id number] Date of Birth/Sex: Treating RN: Nov 18, 1950 (72 y.o. M) Primary Care Provider: [name] Other Clinician: Referring Provider: Treating Provider/Extender: Milagros Evener in Treatment: 69 Information Obtained From Patient Eyes Medical History: Positive for: Cataracts Hematologic/Lymphatic Medical History: Past Medical History Notes: hyperlipidemia Cardiovascular Medical History: Positive for: Hypertension Gastrointestinal Medical History: Positive for: Cirrhosis ; Hepatitis C - says it is gone Past  Medical History Notes: colon polyps Genitourinary Medical History: Past Medical History Notes: BPH Musculoskeletal Medical History: Past Medical History Notes: arthritis Neurologic [name], [name] ([id number]) [id number] Page 9 of 10 Medical History: Past Medical History Notes: stroke HBO Extended History Items Eyes: Cataracts Immunizations Pneumococcal Vaccine: Received Pneumococcal Vaccination: Yes Received Pneumococcal Vaccination On or After 60th Birthday: Yes Implantable Devices None Hospitalization / Surgery History Type of Hospitalization/Surgery inguinal hernia repair knee surgery x4 Family and Social History Cancer: Yes - Mother,Father; Diabetes: Yes - Siblings; Heart Disease: Yes - Maternal Grandparents; Hereditary Spherocytosis: No; Hypertension: Yes - Mother; Kidney Disease: No; Lung Disease: Yes - Mother; Seizures: No; Stroke: Yes - Mother; Thyroid Problems: Yes - Siblings; Tuberculosis: No; Former smoker - ended on 09/25/1982; Marital Status - Single; Alcohol Use: Never; Drug Use: Prior History - cocaine, heroin, marijuana; Caffeine Use: Never; Financial Concerns: No; Food, Clothing or Shelter Needs: No; Support System Lacking: Yes; Transportation Concerns: No Electronic Signature(s) Signed: 11/02/2022 2:04:07 PM By: [name] MD FACS Entered By: [name] on  11/02/2022 13:47:57 -------------------------------------------------------------------------------- SuperBill Details Patient Name: Date of Service: [name], [name] 11/02/2022 Medical Record Number: [id number] Patient Account Number: [id number] Date of Birth/Sex: Treating RN: January 10, 1951 (72 y.o. M) Primary Care Provider: [name] Other Clinician: Referring Provider: Treating Provider/Extender: Milagros Evener in Treatment: 47 Diagnosis Coding ICD-10 Codes Code Description L89.610 Pressure ulcer of right heel, unstageable Z86.73 Personal history of transient ischemic attack (TIA), and cerebral infarction without residual deficits K74.60 Unspecified cirrhosis of liver B18.2 Chronic viral hepatitis C I63.9 Cerebral infarction, unspecified I10 Essential (primary) hypertension Facility Procedures : CPT4 Code: [id number] Description: [id number] - DEBRIDE WOUND 1ST 20 SQ CM OR < ICD-10 Diagnosis Description L89.610 Pressure ulcer of right heel, unstageable Modifier: Quantity: 1 Physician Procedures : CPT4 Code Description Modifier [id number] - WC PHYS LEVEL 4 - EST PT 25 ICD-10 Diagnosis Description L89.610 Pressure ulcer of right heel, unstageable Z86.73 Personal history of transient ischemic attack (TIA), and cerebral infarction without  residual deficits K74.60 Unspecified cirrhosis of liver I10 Essential (primary) hypertension [name], [name] ([id number]) [phone].pdf Page 10 o Quantity: 1 f 10 : [id number] - WC PHYS DEBR WO ANESTH 20 SQ CM 1 ICD-10 Diagnosis Description L89.610 Pressure ulcer of right heel, unstageable Quantity: Electronic Signature(s) Signed: 11/02/2022 1:49:45 PM By: [name] MD FACS Entered By: [name] on 11/02/2022 13:49:45

## 2022-11-16 ENCOUNTER — Encounter (HOSPITAL_BASED_OUTPATIENT_CLINIC_OR_DEPARTMENT_OTHER): Payer: 59 | Admitting: General Surgery

## 2022-11-16 DIAGNOSIS — L89610 Pressure ulcer of right heel, unstageable: Secondary | ICD-10-CM | POA: Diagnosis not present

## 2022-11-18 NOTE — Progress Notes (Signed)
[name], [name] ([id number]) [id number] Page 1 of 7 Visit Report for 11/16/2022 Arrival Information Details Patient Name: Date of Service: [name] 11/16/2022 2:45 PM Medical Record Number: [id number] Patient Account Number: [id number] Date of Birth/Sex: Treating RN: [birth year] (72 y.o. [name] Primary Care [name]: [name] Other Clinician: Referring [name]: Treating [name]/Extender: [name] in Treatment: 70 Visit Information History Since Last Visit All ordered tests and consults were completed: Yes Patient Arrived: [name] Added or deleted any medications: No Arrival Time: 14:29 Any new allergies or adverse reactions: No Accompanied By: self Had a fall or experienced change in No Transfer Assistance: None activities of daily living that may affect Patient Identification Verified: Yes risk of falls: Patient Requires Transmission-Based Precautions: No Signs or symptoms of abuse/neglect since last visito No Patient Has Alerts: Yes Hospitalized since last visit: No Patient Alerts: Patient on Blood Thinner Implantable device outside of the clinic excluding No cellular tissue based products placed in the center since last visit: Pain Present Now: No Electronic Signature(s) Signed: 11/18/2022 1:56:54 PM By: [name] Entered By: [name] on 11/16/2022 14:30:45 -------------------------------------------------------------------------------- Encounter Discharge Information Details Patient Name: Date of Service: [name], [name] 11/16/2022 2:45 PM Medical Record Number: [id number] Patient Account Number: [id number] Date of Birth/Sex: Treating RN: June 26, 1951 (72 y.o. [name] Primary Care [name]: [name] Other Clinician: Referring [name]: Treating [name]/Extender: [name] in Treatment: 59 Encounter Discharge Information  Items Post Procedure Vitals Discharge Condition: Stable Temperature (F): 98.3 Ambulatory Status: Wheelchair Pulse (bpm): 56 Discharge Destination: Home Respiratory Rate (breaths/min): 20 Transportation: Private Auto Blood Pressure (mmHg): 123/70 Accompanied By: spouse Schedule Follow-up Appointment: Yes Clinical Summary of Care: Patient Declined Electronic Signature(s) Signed: 11/18/2022 1:56:54 PM By: [name] Entered By: [name] on 11/16/2022 15:20:37 -------------------------------------------------------------------------------- Lower Extremity Assessment Details Patient Name: Date of Service: [name], [place] [name] 11/16/2022 2:45 PM Medical Record Number: [id number] Patient Account Number: [id number] Date of Birth/Sex: Treating RN: [birth year] (72 y.o. [name] Primary Care Valentine Kuechle: [name] Other Clinician: Referring [name]: Treating Eniyah Eastmond/Extender: [name] in Treatment: 49 [name]: No] [name]: No] H[Left: [name], [name] [phone] [name]: [id number] Page 2 of 7] Edema: [Left: N] [Right: o] Calf Left: Right: Point of Measurement: From Medial Instep 33.8 cm Ankle Left: Right: Point of Measurement: From Medial Instep 24.7 cm Vascular Assessment Pulses: Dorsalis Pedis Palpable: [Right:Yes] Electronic Signature(s) Signed: 11/18/2022 1:56:54 PM By: [name] Entered By: [name] on 11/16/2022 14:37:06 -------------------------------------------------------------------------------- Multi Wound Chart Details Patient Name: Date of Service: [name], [name] 11/16/2022 2:45 PM Medical Record Number: [id number] Patient Account Number: [id number] Date of Birth/Sex: Treating RN: January 08, 1951 (72 y.o. M) Primary Care [name]: [name] Other Clinician: Referring [name]: Treating [name]/Extender: [name] in Treatment: 49 Vital Signs Height(in): 69 Pulse(bpm): 56 Weight(lbs): 165 Blood Pressure(mmHg): 123/70 Body Mass Index(BMI): 24.4 Temperature(F): 98.3 Respiratory Rate(breaths/min): 20 [1:Photos:] [N/A:N/A] Right Calcaneus N/A N/A Wound Location: Pressure Injury N/A N/A Wounding Event: Pressure Ulcer N/A N/A Primary Etiology: Cataracts, Hypertension, Cirrhosis , N/A N/A Comorbid History: Hepatitis C 07/02/2020 N/A N/A Date Acquired: 63 N/A N/A Weeks of Treatment: Open N/A N/A Wound Status: No N/A N/A Wound Recurrence: 0.8x1.8x0.6 N/A N/A Measurements L x W x D (cm) 1.131 N/A N/A A (cm) : rea 0.679 N/A N/A Volume (cm) : 95.40% N/A N/A %  Reduction in A rea: 94.50% N/A N/A % Reduction in Volume: Category/Stage IV N/A N/A Classification: Medium N/A N/A Exudate A mount: Serosanguineous N/A N/A Exudate Type: red, brown N/A N/A Exudate Color: Well defined, not attached N/A N/A Wound Margin: Large (67-100%) N/A N/A Granulation A mount: Red, Pink N/A N/A Granulation Quality: Small (1-33%) N/A N/A Necrotic A mount: Fat Layer (Subcutaneous Tissue): Yes N/A N/A Exposed Structures: Fascia: No Tendon: No [name], [name] ([id number]) [id number] Page 3 of 7 Muscle: No Joint: No Bone: No Small (1-33%) N/A N/A Epithelialization: Debridement - Excisional N/A N/A Debridement: Pre-procedure Verification/Time Out 14:54 N/A N/A Taken: Lidocaine 4% Topical Solution N/A N/A Pain Control: Callus, Subcutaneous, Slough N/A N/A Tissue Debrided: Skin/Subcutaneous Tissue N/A N/A Level: 1.44 N/A N/A Debridement A (sq cm): rea Curette N/A N/A Instrument: Minimum N/A N/A Bleeding: Pressure N/A N/A Hemostasis A chieved: 0 N/A N/A Procedural Pain: 0 N/A N/A Post Procedural Pain: Procedure was tolerated well N/A N/A Debridement Treatment Response: 0.8x1.8x0.6 N/A N/A Post Debridement Measurements L x W x D  (cm) 0.679 N/A N/A Post Debridement Volume: (cm) Category/Stage IV N/A N/A Post Debridement Stage: Callus: Yes N/A N/A Periwound Skin Texture: Excoriation: No Induration: No Crepitus: No Rash: No Scarring: No Maceration: No N/A N/A Periwound Skin Moisture: Dry/Scaly: No Atrophie Blanche: No N/A N/A Periwound Skin Color: Cyanosis: No Ecchymosis: No Erythema: No Hemosiderin Staining: No Mottled: No Pallor: No Rubor: No No Abnormality N/A N/A Temperature: Yes N/A N/A Tenderness on Palpation: Debridement N/A N/A Procedures Performed: Treatment Notes Electronic Signature(s) Signed: 11/16/2022 3:01:50 PM By: [name] MD FACS Entered By: [name] on 11/16/2022 15:01:49 -------------------------------------------------------------------------------- Multi-Disciplinary Care Plan Details Patient Name: Date of Service: [name], [name] 11/16/2022 2:45 PM Medical Record Number: [id number] Patient Account Number: [id number] Date of Birth/Sex: Treating RN: [birth year] (72 y.o. [name] Primary Care Zikeria Keough: [name] Other Clinician: Referring Ayslin Kundert: Treating Mazella Deen/Extender: [name] in Treatment: 11 Multidisciplinary Care Plan reviewed with physician Active Inactive Abuse / Safety / Falls / Self Care Management Nursing Diagnoses: History of Falls Impaired physical mobility Potential for falls Potential for injury related to falls Goals: Patient will remain injury free related to falls Date Initiated: 12/08/2021 Date Inactivated: 01/13/2022 Target Resolution Date: 01/05/2022 Goal Status: Met Patient/caregiver will verbalize understanding of skin care regimen Date Initiated: 12/08/2021 Target Resolution Date: 01/30/2023 Goal Status: Active Addison, [name] ([id number]) [id number] Page 4 of 7 Interventions: Assess fall risk on admission and as needed Assess: immobility,  friction, shearing, incontinence upon admission and as needed Assess impairment of mobility on admission and as needed per policy Provide education on fall prevention Notes: Wound/Skin Impairment Nursing Diagnoses: Impaired tissue integrity Knowledge deficit related to ulceration/compromised skin integrity Goals: Patient/caregiver will verbalize understanding of skin care regimen Date Initiated: 12/08/2021 Target Resolution Date: 01/30/2023 Goal Status: Active Ulcer/skin breakdown will have a volume reduction of 30% by week 4 Date Initiated: 12/08/2021 Date Inactivated: 01/13/2022 Target Resolution Date: 01/05/2022 Goal Status: Met Interventions: Assess patient/caregiver ability to perform ulcer/skin care regimen upon admission and as needed Assess ulceration(s) every visit Provide education on ulcer and skin care Treatment Activities: Skin care regimen initiated : 12/08/2021 Topical wound management initiated : 12/08/2021 Notes: Electronic Signature(s) Signed: 11/18/2022 1:56:54 PM By: [name] Entered By: [name] on 11/16/2022 14:43:48 -------------------------------------------------------------------------------- Pain Assessment Details Patient Name: Date of Service: [name], [name] 11/16/2022 2:45 PM Medical Record Number: [id number] Patient Account Number: [id number]  Date of Birth/Sex: Treating RN: 06-11-1951 (72 y.o. [name] Primary Care Rache Klimaszewski: [name] Other Clinician: Referring Monty Spicher: Treating Shondrika Hoque/Extender: [name] in Treatment: 45 Active Problems Location of Pain Severity and Description of Pain Patient Has Paino No Site Locations Pain Management and Medication Current Pain Management: WATT, GEILER ([id number]) [id number] Page 5 of 7 Electronic Signature(s) Signed: 11/18/2022 1:56:54 PM By: [name] Entered By: [name] on 11/16/2022  14:31:29 -------------------------------------------------------------------------------- Patient/Caregiver Education Details Patient Name: Date of Service: [name] RO N 4/16/2024andnbsp2:45 PM Medical Record Number: [id number] Patient Account Number: [id number] Date of Birth/Gender: Treating RN: 1950/12/14 (72 y.o. [name] Primary Care Physician: [name] Other Clinician: Referring Physician: Treating Physician/Extender: [name] in Treatment: 91 Education Assessment Education Provided To: Patient Education Topics Provided Wound/Skin Impairment: Methods: Explain/Verbal Responses: State content correctly Electronic Signature(s) Signed: 11/18/2022 1:56:54 PM By: [name] Entered By: [name] on 11/16/2022 14:44:08 -------------------------------------------------------------------------------- Wound Assessment Details Patient Name: Date of Service: [name], [name] 11/16/2022 2:45 PM Medical Record Number: [id number] Patient Account Number: [id number] Date of Birth/Sex: Treating RN: 11/26/1950 (72 y.o. [name] Primary Care Giovany Cosby: [name] Other Clinician: Referring Aicia Babinski: Treating Chavela Justiniano/Extender: [name] in Treatment: 49 Wound Status Wound Number: 1 Primary Etiology: Pressure Ulcer Wound Location: Right Calcaneus Wound Status: Open Wounding Event: Pressure Injury Comorbid History: Cataracts, Hypertension, Cirrhosis , Hepatitis C Date Acquired: 07/02/2020 Weeks Of Treatment: 49 Clustered Wound: No Photos Wound Measurements Length: (cm) 0.8 Width: (cm) 1.8 Depth: (cm) 0.6 [name], [name] ([id number]) Area: (cm) 1.131 Volume: (cm) 0.679 % Reduction in Area: 95.4% % Reduction in Volume: 94.5% Epithelialization: Small (1-33%) [id number] Page 6 of 7 Tunneling: No Undermining: No Wound Description Classification:  Category/Stage IV Wound Margin: Well defined, not attached Exudate Amount: Medium Exudate Type: Serosanguineous Exudate Color: red, brown Foul Odor After Cleansing: No Slough/Fibrino Yes Wound Bed Granulation Amount: Large (67-100%) Exposed Structure Granulation Quality: Red, Pink Fascia Exposed: No Necrotic Amount: Small (1-33%) Fat Layer (Subcutaneous Tissue) Exposed: Yes Necrotic Quality: Adherent Slough Tendon Exposed: No Muscle Exposed: No Joint Exposed: No Bone Exposed: No Periwound Skin Texture Texture Color No Abnormalities Noted: No No Abnormalities Noted: Yes Callus: Yes Temperature / Pain Crepitus: No Temperature: No Abnormality Excoriation: No Tenderness on Palpation: Yes Induration: No Rash: No Scarring: No Moisture No Abnormalities Noted: Yes Treatment Notes Wound #1 (Calcaneus) Wound Laterality: Right Cleanser Soap and Water Discharge Instruction: May shower and wash wound with dial antibacterial soap and water prior to dressing change. Wound Cleanser Discharge Instruction: Cleanse the wound with wound cleanser prior to applying a clean dressing using gauze sponges, not tissue or cotton balls. Peri-Wound Care Topical Primary Dressing Hydrofera Blue Ready Transfer Foam, 4x5 (in/in) Discharge Instruction: Apply to wound bed as instructed Secondary Dressing ALLEVYN Heel 4 1/2in x 5 1/2in / 10.5cm x 13.5cm Discharge Instruction: Apply over primary dressing as directed. Woven Gauze Sponge, Non-Sterile 4x4 in Discharge Instruction: Apply over primary dressing as directed. Secured With Elastic Bandage 4 inch (ACE bandage) Discharge Instruction: Secure with ACE bandage as directed. Kerlix Roll Sterile, 4.5x3.1 (in/yd) Discharge Instruction: Secure with Kerlix as directed. 20M Medipore Soft Cloth Surgical T 2x10 (in/yd) ape Discharge Instruction: Secure with tape as directed. Compression Wrap Compression Stockings Add-Ons foot cover Discharge  Instruction: use foot covers Electronic Signature(s) Signed: 11/18/2022 1:56:54 PM By: [name], [place] ([id number]) [name] [phone].pdf Page 7  of 7 Signed: 11/18/2022 1:56:54 PM By: Margaret Pyle By: [name] on 11/16/2022 14:41:42 -------------------------------------------------------------------------------- Vitals Details Patient Name: Date of Service: [name], [name] 11/16/2022 2:45 PM Medical Record Number: [id number] Patient Account Number: [id number] Date of Birth/Sex: Treating RN: 12-18-1950 (73 y.o. [name] Primary Care Corlis Angelica: [name] Other Clinician: Referring Mariaceleste Herrera: Treating Saleen Peden/Extender: [name] in Treatment: 49 Vital Signs Time Taken: 14:30 Temperature (F): 98.3 Height (in): 69 Pulse (bpm): 56 Weight (lbs): 165 Respiratory Rate (breaths/min): 20 Body Mass Index (BMI): 24.4 Blood Pressure (mmHg): 123/70 Reference Range: 80 - 120 mg / dl Electronic Signature(s) Signed: 11/18/2022 1:56:54 PM By: [name] Entered By: [name] on 11/16/2022 14:31:17

## 2022-11-18 NOTE — Progress Notes (Signed)
[place], [name] ([id number]) [id number] Page 1 of 6 Visit Report for 10/12/2022 Arrival Information Details Patient Name: Date of Service: [name] 10/12/2022 1:15 PM Medical Record Number: [id number] Patient Account Number: [id number] Date of Birth/Sex: Treating RN: 07-02-1951 (72 y.o. [name] Primary Care [name]: [name] Other Clinician: Referring [name]: Treating [name]/Extender: [name] in Treatment: 23 Visit Information History Since Last Visit All ordered tests and consults were completed: Yes Patient Arrived: Wheel Chair Added or deleted any medications: No Arrival Time: 13:23 Any new allergies or adverse reactions: No Accompanied By: self Had a fall or experienced change in No Transfer Assistance: EasyPivot Patient Lift activities of daily living that may affect Patient Identification Verified: Yes risk of falls: Secondary Verification Process Completed: Yes Signs or symptoms of abuse/neglect since last visito No Patient Requires Transmission-Based Precautions: No Hospitalized since last visit: No Patient Has Alerts: Yes Implantable device outside of the clinic excluding No Patient Alerts: Patient on Blood Thinner cellular tissue based products placed in the center since last visit: Has Dressing in Place as Prescribed: Yes Pain Present Now: No Electronic Signature(s) Signed: 11/18/2022 1:55:51 PM By: [name] Entered By: [name] on 10/12/2022 13:24:46 -------------------------------------------------------------------------------- Encounter Discharge Information Details Patient Name: Date of Service: [name], [name] 10/12/2022 1:15 PM Medical Record Number: [id number] Patient Account Number: [id number] Date of Birth/Sex: Treating RN: [birth year] (72 y.o. [name] Primary Care [name]: [name] Other Clinician: Referring [name]: Treating  [name]/Extender: [name] in Treatment: 102 Encounter Discharge Information Items Post Procedure Vitals Discharge Condition: Stable Temperature (F): 97.4 Ambulatory Status: Wheelchair Pulse (bpm): 58 Discharge Destination: Home Respiratory Rate (breaths/min): 18 Transportation: Private Auto Blood Pressure (mmHg): 136/72 Accompanied By: wife Schedule Follow-up Appointment: No Clinical Summary of Care: Patient Declined Electronic Signature(s) Signed: 11/18/2022 1:55:51 PM By: [name] Entered By: [name] on 10/12/2022 14:25:22 -------------------------------------------------------------------------------- Lower Extremity Assessment Details Patient Name: Date of Service: [name], [place] [name] 10/12/2022 1:15 PM Medical Record Number: [id number] Patient Account Number: [id number] Date of Birth/Sex: Treating RN: [birth year] (72 y.o. [name] Primary Care Nihaal Friesen: [name] Other Clinician: Referring Shima Compere: Treating [name]/Extender: [name] in Treatment: 44 Edema Assessment H[Left: [name] [phone] [name]: [id number] Page 2 of 6] Assessed: [Left: No] [Right: No] Edema: [Left: N] [Right: o] Calf Left: Right: Point of Measurement: From Medial Instep 33.5 cm Ankle Left: Right: Point of Measurement: From Medial Instep 22.9 cm Vascular Assessment Pulses: Dorsalis Pedis Palpable: [Right:Yes] Electronic Signature(s) Signed: 11/18/2022 1:55:51 PM By: [name] Entered By: [name] on 10/12/2022 13:33:27 -------------------------------------------------------------------------------- Multi Wound Chart Details Patient Name: Date of Service: [name], [name] 10/12/2022 1:15 PM Medical Record Number: [id number] Patient Account Number: [id number] Date of Birth/Sex: Treating RN: Jun 09, 1951 (72 y.o. M) Primary Care [name]: [name]  Other Clinician: Referring [name]: Treating [name]/Extender: [name] in Treatment: 55 Vital Signs Height(in): 69 Pulse(bpm): 58 Weight(lbs): 165 Blood Pressure(mmHg): 136/72 Body Mass Index(BMI): 24.4 Temperature(F): 97.4 Respiratory Rate(breaths/min): 18 [1:Photos:] [N/A:N/A] Right Calcaneus N/A N/A Wound Location: Pressure Injury N/A N/A Wounding Event: Pressure Ulcer N/A N/A Primary Etiology: Cataracts, Hypertension, Cirrhosis , N/A N/A Comorbid History: Hepatitis C 07/02/2020 N/A N/A Date Acquired: 45 N/A N/A Weeks of Treatment: Open N/A N/A Wound Status: No N/A N/A Wound Recurrence: 0.8x2x1.8 N/A N/A Measurements L x W x D (cm) 1.257 N/A  N/A A (cm) : rea 2.262 N/A N/A Volume (cm) : 94.90% N/A N/A % Reduction in A rea: 81.70% N/A N/A % Reduction in Volume: Category/Stage IV N/A N/A Classification: Medium N/A N/A Exudate A mount: Serosanguineous N/A N/A Exudate Type: red, brown N/A N/A Exudate Color: Well defined, not attached N/A N/A Wound Margin: Large (67-100%) N/A N/A Granulation A mount: Red, Pink N/A N/A Granulation Quality: Small (1-33%) N/A N/A Necrotic A mount: Fat Layer (Subcutaneous Tissue): Yes N/A N/A Exposed Structures: Fascia: No [name], [name] ([id number]) [id number] Page 3 of 6 Tendon: No Muscle: No Joint: No Bone: No Small (1-33%) N/A N/A Epithelialization: Debridement - Excisional N/A N/A Debridement: Pre-procedure Verification/Time Out 13:54 N/A N/A Taken: Lidocaine 4% Topical Solution N/A N/A Pain Control: Callus, Subcutaneous, Slough N/A N/A Tissue Debrided: Skin/Subcutaneous Tissue N/A N/A Level: 4.5 N/A N/A Debridement A (sq cm): rea Curette N/A N/A Instrument: Minimum N/A N/A Bleeding: Pressure N/A N/A Hemostasis A chieved: 0 N/A N/A Procedural Pain: 0 N/A N/A Post Procedural Pain: Procedure was tolerated well N/A N/A Debridement  Treatment Response: 0.8x2.3x1.8 N/A N/A Post Debridement Measurements L x W x D (cm) 2.601 N/A N/A Post Debridement Volume: (cm) Category/Stage IV N/A N/A Post Debridement Stage: Callus: Yes N/A N/A Periwound Skin Texture: Excoriation: No Induration: No Crepitus: No Rash: No Scarring: No Maceration: No N/A N/A Periwound Skin Moisture: Dry/Scaly: No Atrophie Blanche: No N/A N/A Periwound Skin Color: Cyanosis: No Ecchymosis: No Erythema: No Hemosiderin Staining: No Mottled: No Pallor: No Rubor: No No Abnormality N/A N/A Temperature: Yes N/A N/A Tenderness on Palpation: Debridement N/A N/A Procedures Performed: Treatment Notes Electronic Signature(s) Signed: 10/12/2022 2:09:18 PM By: [name] MD FACS Entered By: [name] on 10/12/2022 14:09:18 -------------------------------------------------------------------------------- Multi-Disciplinary Care Plan Details Patient Name: Date of Service: [name], [name] 10/12/2022 1:15 PM Medical Record Number: [id number] Patient Account Number: [id number] Date of Birth/Sex: Treating RN: August 10, 1950 (72 y.o. [name] Primary Care Korben Carcione: [name] Other Clinician: Referring Jorell Agne: Treating Ladonna Vanorder/Extender: [name] in Treatment: 47 Multidisciplinary Care Plan reviewed with physician Active Inactive Abuse / Safety / Falls / Self Care Management Nursing Diagnoses: History of Falls Impaired physical mobility Potential for falls Potential for injury related to falls Goals: Patient will remain injury free related to falls Date Initiated: 12/08/2021 Date Inactivated: 01/13/2022 Target Resolution Date: 01/05/2022 Goal Status: Met Patient/caregiver will verbalize understanding of skin care regimen Date Initiated: 12/08/2021 Target Resolution Date: 10/31/2022 [name], [name] ([id number]) [id number] Page 4 of 6 Goal Status:  Active Interventions: Assess fall risk on admission and as needed Assess: immobility, friction, shearing, incontinence upon admission and as needed Assess impairment of mobility on admission and as needed per policy Provide education on fall prevention Notes: Wound/Skin Impairment Nursing Diagnoses: Impaired tissue integrity Knowledge deficit related to ulceration/compromised skin integrity Goals: Patient/caregiver will verbalize understanding of skin care regimen Date Initiated: 12/08/2021 Target Resolution Date: 10/31/2022 Goal Status: Active Ulcer/skin breakdown will have a volume reduction of 30% by week 4 Date Initiated: 12/08/2021 Date Inactivated: 01/13/2022 Target Resolution Date: 01/05/2022 Goal Status: Met Interventions: Assess patient/caregiver ability to perform ulcer/skin care regimen upon admission and as needed Assess ulceration(s) every visit Provide education on ulcer and skin care Treatment Activities: Skin care regimen initiated : 12/08/2021 Topical wound management initiated : 12/08/2021 Notes: Electronic Signature(s) Signed: 11/18/2022 1:55:51 PM By: [name] Entered By: [name] on 10/12/2022 13:45:57 -------------------------------------------------------------------------------- Pain Assessment Details Patient Name: Date of Service: [name],  [name] 10/12/2022 1:15 PM Medical Record Number: [id number] Patient Account Number: [id number] Date of Birth/Sex: Treating RN: 1950/10/31 (72 y.o. [name] Primary Care Inioluwa Baris: [name] Other Clinician: Referring Hedda Crumbley: Treating Valynn Schamberger/Extender: [name] in Treatment: 56 Active Problems Location of Pain Severity and Description of Pain Patient Has Paino No Site Locations Pain Management and Medication TILMAN, MCCLAREN ([id number]) [id number] Page 5 of 6 Current Pain Management: Electronic Signature(s) Signed: 11/18/2022  1:55:51 PM By: [name] Entered By: [name] on 10/12/2022 13:27:23 -------------------------------------------------------------------------------- Patient/Caregiver Education Details Patient Name: Date of Service: [name] RO N 3/12/2024andnbsp1:15 PM Medical Record Number: [id number] Patient Account Number: [id number] Date of Birth/Gender: Treating RN: 01-18-1951 (72 y.o. [name] Primary Care Physician: [name] Other Clinician: Referring Physician: Treating Physician/Extender: [name] in Treatment: 59 Education Assessment Education Provided To: Patient Education Topics Provided Offloading: Methods: Explain/Verbal Responses: Reinforcements needed, State content correctly Wound/Skin Impairment: Methods: Explain/Verbal Responses: Reinforcements needed, State content correctly Electronic Signature(s) Signed: 11/18/2022 1:55:51 PM By: [name] Entered By: [name] on 10/12/2022 13:46:15 -------------------------------------------------------------------------------- Wound Assessment Details Patient Name: Date of Service: [name], [name] 10/12/2022 1:15 PM Medical Record Number: [id number] Patient Account Number: [id number] Date of Birth/Sex: Treating RN: 1951/01/15 (72 y.o. [name] Primary Care Kamin Niblack: [name] Other Clinician: Referring Kalya Troeger: Treating Abcde Oneil/Extender: [name] in Treatment: 44 Wound Status Wound Number: 1 Primary Etiology: Pressure Ulcer Wound Location: Right Calcaneus Wound Status: Open Wounding Event: Pressure Injury Comorbid History: Cataracts, Hypertension, Cirrhosis , Hepatitis C Date Acquired: 07/02/2020 Weeks Of Treatment: 44 Clustered Wound: No Photos [name], [name] ([id number]) [id number] Page 6 of 6 Wound Measurements Length: (cm) 0.8 Width: (cm) 2 Depth: (cm) 1.8 Area:  (cm) 1.257 Volume: (cm) 2.262 % Reduction in Area: 94.9% % Reduction in Volume: 81.7% Epithelialization: Small (1-33%) Tunneling: No Undermining: No Wound Description Classification: Category/Stage IV Wound Margin: Well defined, not attached Exudate Amount: Medium Exudate Type: Serosanguineous Exudate Color: red, brown Foul Odor After Cleansing: No Slough/Fibrino Yes Wound Bed Granulation Amount: Large (67-100%) Exposed Structure Granulation Quality: Red, Pink Fascia Exposed: No Necrotic Amount: Small (1-33%) Fat Layer (Subcutaneous Tissue) Exposed: Yes Necrotic Quality: Adherent Slough Tendon Exposed: No Muscle Exposed: No Joint Exposed: No Bone Exposed: No Periwound Skin Texture Texture Color No Abnormalities Noted: No No Abnormalities Noted: Yes Callus: Yes Temperature / Pain Crepitus: No Temperature: No Abnormality Excoriation: No Tenderness on Palpation: Yes Induration: No Rash: No Scarring: No Moisture No Abnormalities Noted: Yes Electronic Signature(s) Signed: 11/18/2022 1:55:51 PM By: [name] Entered By: [name] on 10/12/2022 13:43:20 -------------------------------------------------------------------------------- Vitals Details Patient Name: Date of Service: [name], [name] 10/12/2022 1:15 PM Medical Record Number: [id number] Patient Account Number: [id number] Date of Birth/Sex: Treating RN: 06/30/1951 (72 y.o. [name] Primary Care Aspen Deterding: [name] Other Clinician: Referring Lowanda Cashaw: Treating Undra Trembath/Extender: [name] in Treatment: 8 Vital Signs Time Taken: 13:20 Temperature (F): 97.4 Height (in): 69 Pulse (bpm): 58 Weight (lbs): 165 Respiratory Rate (breaths/min): 18 Body Mass Index (BMI): 24.4 Blood Pressure (mmHg): 136/72 Reference Range: 80 - 120 mg / dl Electronic Signature(s) Signed: 11/18/2022 1:55:51 PM By: [name] Entered By: [name] on 10/12/2022  13:27:13

## 2022-11-18 NOTE — Progress Notes (Signed)
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 portion has contracted further. There is some callus around the perimeter as well as some slough on the remaining exposed surface. Quite a bit of the heel has epithelialized. Patient History Information obtained from Patient. Family History Cancer - Mother,Father, Diabetes - Siblings, Heart Disease - Maternal Grandparents, Hypertension - Mother, Lung Disease - Mother, Stroke - Mother, Thyroid Problems - Siblings, No family history of Hereditary Spherocytosis, Kidney Disease, Seizures, Tuberculosis. 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 Medical History Eyes Patient has history of Cataracts Cardiovascular Patient has history of Hypertension Gastrointestinal Patient has history of Cirrhosis , Hepatitis C - says it is gone Hospitalization/Surgery History - inguinal hernia  repair. - knee surgery x4. Medical A Surgical History Notes nd Hematologic/Lymphatic hyperlipidemia Gastrointestinal colon polyps Genitourinary BPH Musculoskeletal arthritis Neurologic stroke [place], [name] ([id number]) [id number] Page 7 of 10 Objective Constitutional Bradycardic, asymptomatic. no acute distress. Vitals Time Taken: 2:30 PM, Height: 69 in, Weight: 165 lbs, BMI: 24.4, Temperature: 98.3 F, Pulse: 56 bpm, Respiratory Rate: 20 breaths/min, Blood Pressure: 123/70 mmHg. Respiratory Normal work of breathing on room air. General Notes: 11/16/2022: His wound is smaller and the undermined portion has contracted further. There is some callus around the perimeter as well as some slough on the remaining exposed surface. Quite a bit of the heel has epithelialized. Integumentary (Hair, Skin) Wound #1 status is Open. Original cause of wound was Pressure Injury. The date acquired was: 07/02/2020. The wound has been in treatment 49 weeks. The wound is located on the Right Calcaneus. The wound measures 0.8cm length x 1.8cm width x 0.6cm depth; 1.131cm^2 area and 0.679cm^3 volume. There is Fat Layer (Subcutaneous Tissue) exposed. There is no tunneling or undermining noted. There is a medium amount of serosanguineous drainage noted. The wound margin is well defined and not attached to the wound base. There is large (67-100%) red, pink granulation within the wound bed. There is a small (1-33%) amount of necrotic tissue within the wound bed including Adherent Slough. The periwound skin appearance had no abnormalities noted for moisture. The periwound skin appearance had no abnormalities noted for color. The periwound skin appearance exhibited: Callus. The periwound skin appearance did not exhibit: Crepitus, Excoriation, Induration, Rash, Scarring. Periwound temperature was noted as No Abnormality. The periwound has tenderness on palpation. 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 There was a Excisional Skin/Subcutaneous Tissue Debridement with a total area of 1.44 sq cm performed by [name], MD. With the following instrument(s): Curette to remove Viable and Non-Viable tissue/material. Material removed includes Callus, Subcutaneous Tissue, and Slough after achieving pain control using Lidocaine 4% T opical Solution. No specimens were taken. A time out was conducted at 14:54, prior to the start of the procedure. A Minimum amount of bleeding was controlled with Pressure. The procedure was tolerated well with a pain level of 0 throughout and a pain level of 0 following the procedure. Post Debridement Measurements: 0.8cm length x 1.8cm width x 0.6cm depth; 0.679cm^3 volume. Post debridement Stage noted as Category/Stage IV. Character of Wound/Ulcer Post Debridement is improved. Post procedure Diagnosis Wound #1: Same as Pre-Procedure General Notes: scribed for Dr. Lady Gary by Brenton Grills, RN. Plan Follow-up Appointments: Return Appointment in 2 weeks. - Dr. Lady Gary Room 3 Anesthetic: Wound #1 Right Calcaneus: (In clinic) Topical Lidocaine 4% applied to wound bed Bathing/ Shower/ Hygiene: May shower and wash wound with soap and water. - with dressing changes Off-Loading: Other: - heel offloading sandal, avoid pressure to back of the heel with pillows under calves to float heels Home Health: No change in wound care orders this week; continue Home Health for wound care. May utilize formulary equivalent dressing for wound treatment orders unless otherwise specified. - slide  hydrofera blue into undermined area Dressing changes to be completed by Home Health on Monday / Wednesday / Friday except when patient has scheduled visit at [facility]. Other Home Health Orders/Instructions: - [facility] 3x a week [name], [name] ([id number]) [id number] Page 8 of 10 WOUND #1: - Calcaneus Wound Laterality: Right Cleanser: Soap and Water 3 x Per Week/30 Days Discharge Instructions: May shower and wash wound with dial antibacterial soap and water prior to dressing change. Cleanser: Wound Cleanser 3 x Per Week/30 Days Discharge Instructions: Cleanse the wound with wound cleanser prior to applying a clean dressing using gauze sponges, not tissue or cotton balls. Prim Dressing: Hydrofera Blue Ready Transfer Foam, 4x5 (in/in) 3 x Per Week/30 Days ary Discharge Instructions: Apply to wound bed as instructed Secondary Dressing: ALLEVYN Heel 4 1/2in x 5 1/2in / 10.5cm x 13.5cm (Generic) 3 x Per Week/30 Days Discharge Instructions: Apply over primary dressing as directed. Secondary Dressing: Woven Gauze Sponge, Non-Sterile 4x4 in 3 x Per Week/30 Days Discharge Instructions: Apply over primary dressing as directed. Secured With: Elastic Bandage 4 inch (ACE bandage) (Generic) 3 x Per Week/30 Days Discharge Instructions: Secure with ACE bandage as directed. Secured With: [employer], 4.5x3.1 (in/yd) (Generic) 3 x Per Week/30 Days Discharge Instructions: Secure with Kerlix as directed. Secured With: 37M Medipore Scientist, research (life sciences) Surgical T 2x10 (in/yd) (Generic) 3 x Per Week/30 Days ape Discharge Instructions: Secure with tape as directed. Add-Ons: foot cover (Generic) 3 x Per Week/30 Days Discharge Instructions: use foot covers 11/16/2022: His wound is smaller and the undermined portion has contracted further. There is some callus around the perimeter as well as some slough on the remaining exposed surface. Quite a bit of the heel has  epithelialized. I used a curette to debride callus, slough, and subcutaneous tissue from his wound. We will continue to use [facility], making sure to pack it up into the undermined portion of his wound. Continue offloading with a heel offloading shoe and floating his heels whenever he is sitting or lying down. He will follow-up in 2 weeks.  Electronic Signature(s) Signed: 11/16/2022 3:04:54 PM By: [name] MD FACS Entered By: [name] on 11/16/2022 15:04:53 -------------------------------------------------------------------------------- HxROS Details Patient Name: Date of Service: [name], [name] 11/16/2022 2:45 PM Medical Record Number: [id number] Patient Account Number: [id number] Date of Birth/Sex: Treating RN: [birth year] (72 y.o. M) Primary Care Provider: Annita Brod Other Clinician: Referring Provider: Treating Provider/Extender: Satira Sark in Treatment: 68 Information Obtained From Patient Eyes Medical History: Positive for: Cataracts Hematologic/Lymphatic Medical History: Past Medical History Notes: hyperlipidemia Cardiovascular Medical History: Positive for: Hypertension Gastrointestinal Medical History: Positive for: Cirrhosis ; Hepatitis C - says it is gone Past Medical History Notes: colon polyps Genitourinary Medical History: Past Medical History Notes: BPH [name], [name] ([id number]) [id number] Page 9 of 10 Musculoskeletal Medical History: Past Medical History Notes: arthritis Neurologic Medical History: Past Medical History Notes: stroke HBO Extended History Items Eyes: Cataracts Immunizations Pneumococcal Vaccine: Received Pneumococcal Vaccination: Yes Received Pneumococcal Vaccination On or After 60th Birthday: Yes Implantable Devices None Hospitalization / Surgery History Type of Hospitalization/Surgery inguinal hernia repair knee surgery x4 Family and  Social History Cancer: Yes - Mother,Father; Diabetes: Yes - Siblings; Heart Disease: Yes - Maternal Grandparents; Hereditary Spherocytosis: No; Hypertension: Yes - Mother; Kidney Disease: No; Lung Disease: Yes - Mother; Seizures: No; Stroke: Yes - Mother; Thyroid Problems: Yes - Siblings; Tuberculosis: No; Former smoker - ended on 09/25/1982; Marital Status - Single; Alcohol Use: Never; Drug Use: Prior History - cocaine, heroin, marijuana; Caffeine Use: Never; Financial Concerns: No; Food, Clothing or Shelter Needs: No; Support System Lacking: Yes; Transportation Concerns: No Electronic Signature(s) Signed: 11/16/2022 3:26:02 PM By: [name] MD FACS Entered By: [name] on 11/16/2022 15:02:46 -------------------------------------------------------------------------------- SuperBill Details Patient Name: Date of Service: [name], [name] 11/16/2022 Medical Record Number: [id number] Patient Account Number: [id number] Date of Birth/Sex: Treating RN: 03-18-1951 (72 y.o. M) Primary Care Provider: Annita Brod Other Clinician: Referring Provider: Treating Provider/Extender: Satira Sark in Treatment: 49 Diagnosis Coding ICD-10 Codes Code Description L89.610 Pressure ulcer of right heel, unstageable Z86.73 Personal history of transient ischemic attack (TIA), and cerebral infarction without residual deficits K74.60 Unspecified cirrhosis of liver B18.2 Chronic viral hepatitis C I63.9 Cerebral infarction, unspecified I10 Essential (primary) hypertension Facility Procedures : CPT4 Code: [id number] Description: 11042 - DEB SUBQ TISSUE 20 SQ CM/< ICD-10 Diagnosis Description L89.610 Pressure ulcer of right heel, unstageable Modifier: Quantity: 1 Physician Procedures : CPT4 Code Description Modifier [name], [name] ([id number]) [id number] - WC PHYS LEVEL 4 - EST PT 25 ICD-10 Diagnosis Description L89.610  Pressure ulcer of right heel, unstageable K74.60 Unspecified cirrhosis of  liver I10 Essential (primary) hypertension Z86.73 Personal history of transient ischemic attack (TIA), 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

## 2022-11-30 ENCOUNTER — Ambulatory Visit (HOSPITAL_BASED_OUTPATIENT_CLINIC_OR_DEPARTMENT_OTHER): Payer: 59 | Admitting: General Surgery

## 2022-12-07 ENCOUNTER — Ambulatory Visit (HOSPITAL_BASED_OUTPATIENT_CLINIC_OR_DEPARTMENT_OTHER): Payer: 59 | Admitting: General Surgery

## 2022-12-14 ENCOUNTER — Encounter (HOSPITAL_BASED_OUTPATIENT_CLINIC_OR_DEPARTMENT_OTHER): Payer: 59 | Attending: General Surgery | Admitting: General Surgery

## 2022-12-14 DIAGNOSIS — L84 Corns and callosities: Secondary | ICD-10-CM | POA: Insufficient documentation

## 2022-12-14 DIAGNOSIS — Z9889 Other specified postprocedural states: Secondary | ICD-10-CM | POA: Insufficient documentation

## 2022-12-14 DIAGNOSIS — L89610 Pressure ulcer of right heel, unstageable: Secondary | ICD-10-CM | POA: Diagnosis present

## 2022-12-14 DIAGNOSIS — B182 Chronic viral hepatitis C: Secondary | ICD-10-CM | POA: Diagnosis not present

## 2022-12-14 DIAGNOSIS — Z8673 Personal history of transient ischemic attack (TIA), and cerebral infarction without residual deficits: Secondary | ICD-10-CM | POA: Diagnosis not present

## 2022-12-14 DIAGNOSIS — Z8616 Personal history of COVID-19: Secondary | ICD-10-CM | POA: Insufficient documentation

## 2022-12-14 DIAGNOSIS — I10 Essential (primary) hypertension: Secondary | ICD-10-CM | POA: Insufficient documentation

## 2022-12-14 DIAGNOSIS — K7460 Unspecified cirrhosis of liver: Secondary | ICD-10-CM | POA: Insufficient documentation

## 2022-12-28 ENCOUNTER — Encounter (HOSPITAL_BASED_OUTPATIENT_CLINIC_OR_DEPARTMENT_OTHER): Payer: 59 | Admitting: General Surgery

## 2022-12-28 DIAGNOSIS — L89610 Pressure ulcer of right heel, unstageable: Secondary | ICD-10-CM | POA: Diagnosis not present

## 2023-01-11 ENCOUNTER — Ambulatory Visit (HOSPITAL_BASED_OUTPATIENT_CLINIC_OR_DEPARTMENT_OTHER): Payer: 59 | Admitting: General Surgery

## 2023-01-18 ENCOUNTER — Encounter (HOSPITAL_BASED_OUTPATIENT_CLINIC_OR_DEPARTMENT_OTHER): Payer: 59 | Attending: General Surgery | Admitting: General Surgery

## 2023-01-18 DIAGNOSIS — L97512 Non-pressure chronic ulcer of other part of right foot with fat layer exposed: Secondary | ICD-10-CM | POA: Insufficient documentation

## 2023-01-18 DIAGNOSIS — B182 Chronic viral hepatitis C: Secondary | ICD-10-CM | POA: Diagnosis not present

## 2023-01-18 DIAGNOSIS — I10 Essential (primary) hypertension: Secondary | ICD-10-CM | POA: Diagnosis not present

## 2023-01-18 DIAGNOSIS — K7460 Unspecified cirrhosis of liver: Secondary | ICD-10-CM | POA: Insufficient documentation

## 2023-01-18 DIAGNOSIS — L89610 Pressure ulcer of right heel, unstageable: Secondary | ICD-10-CM | POA: Insufficient documentation

## 2023-01-18 DIAGNOSIS — Z87891 Personal history of nicotine dependence: Secondary | ICD-10-CM | POA: Insufficient documentation

## 2023-01-18 DIAGNOSIS — E785 Hyperlipidemia, unspecified: Secondary | ICD-10-CM | POA: Diagnosis not present

## 2023-01-18 DIAGNOSIS — Z833 Family history of diabetes mellitus: Secondary | ICD-10-CM | POA: Diagnosis not present

## 2023-01-18 NOTE — Progress Notes (Signed)
Piney, [name] ([id number]) [id number] Page 1 of 7 Visit Report for 01/18/2023 Arrival Information Details Patient Name: Date of Service: [name], [place] RO N 01/18/2023 1:00 PM Medical Record Number: [id number] Patient Account Number: [id number] Date of Birth/Sex: Treating RN: 1951-06-17 (72 y.o. [name] Primary Care Markayla Reichart: Annita Brod Other Clinician: Referring Jariyah Hackley: Treating Yaniv Lage/Extender: Satira Sark in Treatment: 30 Visit Information History Since Last Visit Added or deleted any medications: No Patient Arrived: Wheel Chair Any new allergies or adverse reactions: No Arrival Time: 12:50 Had a fall or experienced change in No Accompanied By: sister activities of daily living that may affect Transfer Assistance: Manual risk of falls: Patient Identification Verified: Yes Signs or symptoms of abuse/neglect since last visito No Patient Requires Transmission-Based Precautions: No Hospitalized since last visit: No Patient Has Alerts: Yes Implantable device outside of the clinic excluding No Patient Alerts: Patient on Blood Thinner cellular tissue based products placed in the center since last visit: Has Dressing in Place as Prescribed: Yes Pain Present Now: Yes Electronic Signature(s) Signed: 01/18/2023 4:38:14 PM By: Karie Schwalbe RN Entered By: Karie Schwalbe on 01/18/2023 12:51:26 -------------------------------------------------------------------------------- Encounter Discharge Information Details Patient Name: Date of Service: [name], [name] 01/18/2023 1:00 PM Medical Record Number: [id number] Patient Account Number: [id number] Date of Birth/Sex: Treating RN: 1951/01/22 (72 y.o. [name] Primary Care Loxley Schmale: Annita Brod Other Clinician: Referring Cliffie Gingras: Treating Adie Vilar/Extender: Satira Sark in Treatment: 73 Encounter Discharge  Information Items Post Procedure Vitals Discharge Condition: Stable Temperature (F): 97.8 Ambulatory Status: Wheelchair Pulse (bpm): 69 Discharge Destination: Home Respiratory Rate (breaths/min): 16 Transportation: Private Auto Blood Pressure (mmHg): 144/78 Accompanied By: sister Schedule Follow-up Appointment: Yes Clinical Summary of Care: Patient Declined Electronic Signature(s) Signed: 01/18/2023 4:38:14 PM By: Karie Schwalbe RN Entered By: Karie Schwalbe on 01/18/2023 16:37:29 Gwynn Burly ([id number]) [id number] Page 2 of 7 -------------------------------------------------------------------------------- Lower Extremity Assessment Details Patient Name: Date of Service: [name], [place] RO N 01/18/2023 1:00 PM Medical Record Number: [id number] Patient Account Number: [id number] Date of Birth/Sex: Treating RN: 06/16/1951 (72 y.o. [name] Primary Care Kaliope Quinonez: Annita Brod Other Clinician: Referring Felice Deem: Treating Shamia Uppal/Extender: Satira Sark in Treatment: 58 Edema Assessment Assessed: Kyra Searles: No] [Right: No] Edema: [Left: N] [Right: o] Calf Left: Right: Point of Measurement: From Medial Instep 33.1 cm Ankle Left: Right: Point of Measurement: From Medial Instep 24.2 cm Vascular Assessment Pulses: Dorsalis Pedis Palpable: [Right:Yes] Electronic Signature(s) Signed: 01/18/2023 4:38:14 PM By: Karie Schwalbe RN Entered By: Karie Schwalbe on 01/18/2023 12:59:40 -------------------------------------------------------------------------------- Multi Wound Chart Details Patient Name: Date of Service: [name], [name] 01/18/2023 1:00 PM Medical Record Number: [id number] Patient Account Number: [id number] Date of Birth/Sex: Treating RN: May 16, 1951 (72 y.o. M) Primary Care Idonna Heeren: Annita Brod Other Clinician: Referring Troyce Febo: Treating Nini Cavan/Extender: Satira Sark in Treatment: 58 Vital Signs Height(in): 69 Pulse(bpm): 69 Weight(lbs): 165 Blood Pressure(mmHg): 144/78 Body Mass Index(BMI): 24.4 Temperature(F): 97.8 Respiratory Rate(breaths/min): 16 [1:Photos:] [N/A:N/A] Right Calcaneus N/A N/A Wound Location: Pressure Injury N/A N/A Wounding Event: Pressure Ulcer N/A N/A Primary Etiology: Cataracts, Hypertension, Cirrhosis , N/A N/A Comorbid History: Hepatitis C 07/02/2020 N/A N/A Date Acquired: 75 N/A N/A Weeks of Treatment: Open N/A N/A Wound Status: No N/A N/A Wound Recurrence: 0.7x1.7x0.7 N/A N/A Measurements L x W x D (cm) 0.935 N/A N/A A (cm) : rea 0.654 N/A N/A Volume (cm) : 96.20% N/A N/A %  Reduction in A rea: 94.70% N/A N/A % Reduction in Volume: Category/Stage IV N/A N/A Classification: Medium N/A N/A Exudate A mount: Serosanguineous N/A N/A Exudate Type: red, brown N/A N/A Exudate Color: Well defined, not attached N/A N/A Wound Margin: Small (1-33%) N/A N/A Granulation A mount: Pink N/A N/A Granulation Quality: Large (67-100%) N/A N/A Necrotic A mount: Eschar, Adherent Slough N/A N/A Necrotic Tissue: Fat Layer (Subcutaneous Tissue): Yes N/A N/A Exposed Structures: Fascia: No Tendon: No Muscle: No Joint: No Bone: No Medium (34-66%) N/A N/A Epithelialization: Debridement - Selective/Open Wound N/A N/A Debridement: Pre-procedure Verification/Time Out 13:18 N/A N/A Taken: Lidocaine 5% topical ointment N/A N/A Pain Control: Necrotic/Eschar, Slough N/A N/A Tissue Debrided: Non-Viable Tissue N/A N/A Level: 0.93 N/A N/A Debridement A (sq cm): rea Curette N/A N/A Instrument: Minimum N/A N/A Bleeding: Pressure N/A N/A Hemostasis A chieved: 0 N/A N/A Procedural Pain: 0 N/A N/A Post Procedural Pain: Procedure was tolerated well N/A N/A Debridement Treatment Response: 0.7x1.7x0.7 N/A N/A Post Debridement Measurements L x W x D (cm) 0.654 N/A N/A Post Debridement Volume:  (cm) Category/Stage IV N/A N/A Post Debridement Stage: Callus: Yes N/A N/A Periwound Skin Texture: Excoriation: No Induration: No Crepitus: No Rash: No Scarring: No Maceration: No N/A N/A Periwound Skin Moisture: Dry/Scaly: No Atrophie Blanche: No N/A N/A Periwound Skin Color: Cyanosis: No Ecchymosis: No Erythema: No Hemosiderin Staining: No Mottled: No Pallor: No Rubor: No No Abnormality N/A N/A Temperature: Debridement N/A N/A Procedures Performed: Treatment Notes Electronic Signature(s) Signed: 01/18/2023 1:41:46 PM By: [name] MD FACS Entered By: [name] on 01/18/2023 13:41:45 Multi-Disciplinary Care Plan Details -------------------------------------------------------------------------------- Gwynn Burly ([id number]) [id number] Page 4 of 7 Patient Name: Date of Service: [name], [place] RO N 01/18/2023 1:00 PM Medical Record Number: [id number] Patient Account Number: [id number] Date of Birth/Sex: Treating RN: 1951/05/03 (72 y.o. [name] Primary Care Takiya Belmares: Annita Brod Other Clinician: Referring Tyrea Froberg: Treating Deontray Hunnicutt/Extender: Satira Sark in Treatment: 16 Multidisciplinary Care Plan reviewed with physician Active Inactive Abuse / Safety / Falls / Self Care Management Nursing Diagnoses: History of Falls Impaired physical mobility Potential for falls Potential for injury related to falls Goals: Patient will remain injury free related to falls Date Initiated: 12/08/2021 Date Inactivated: 01/13/2022 Target Resolution Date: 01/05/2022 Goal Status: Met Patient/caregiver will verbalize understanding of skin care regimen Date Initiated: 12/08/2021 Target Resolution Date: 03/02/2023 Goal Status: Active Interventions: Assess fall risk on admission and as needed Assess: immobility, friction, shearing, incontinence upon admission and as needed Assess impairment of mobility  on admission and as needed per policy Provide education on fall prevention Notes: Wound/Skin Impairment Nursing Diagnoses: Impaired tissue integrity Knowledge deficit related to ulceration/compromised skin integrity Goals: Patient/caregiver will verbalize understanding of skin care regimen Date Initiated: 12/08/2021 Target Resolution Date: 03/02/2023 Goal Status: Active Ulcer/skin breakdown will have a volume reduction of 30% by week 4 Date Initiated: 12/08/2021 Date Inactivated: 01/13/2022 Target Resolution Date: 01/05/2022 Goal Status: Met Interventions: Assess patient/caregiver ability to perform ulcer/skin care regimen upon admission and as needed Assess ulceration(s) every visit Provide education on ulcer and skin care Treatment Activities: Skin care regimen initiated : 12/08/2021 Topical wound management initiated : 12/08/2021 Notes: Electronic Signature(s) Signed: 01/18/2023 4:38:14 PM By: Karie Schwalbe RN Entered By: Karie Schwalbe on 01/18/2023 16:34:37 Pain Assessment Details -------------------------------------------------------------------------------- Gwynn Burly ([id number]) [id number] Page 5 of 7 Patient Name: Date of Service: [name], [place] 01/18/2023 1:00 PM Medical Record Number: [id number] Patient Account Number:  [id number] Date of Birth/Sex: Treating RN: Sep 19, 1950 (72 y.o. [name] Primary Care Orlena Garmon: Annita Brod Other Clinician: Referring Shade Rivenbark: Treating Bladen Umar/Extender: Satira Sark in Treatment: 68 Active Problems Location of Pain Severity and Description of Pain Patient Has Paino No Site Locations Pain Management and Medication Current Pain Management: Electronic Signature(s) Signed: 01/18/2023 4:38:14 PM By: Karie Schwalbe RN Entered By: Karie Schwalbe on 01/18/2023  13:13:15 -------------------------------------------------------------------------------- Patient/Caregiver Education Details Patient Name: Date of Service: [name] RO N 6/18/2024andnbsp1:00 PM Medical Record Number: [id number] Patient Account Number: [id number] Date of Birth/Gender: Treating RN: 1951/01/28 (72 y.o. [name] Primary Care Physician: Annita Brod Other Clinician: Referring Physician: Treating Physician/Extender: Satira Sark in Treatment: 93 Education Assessment Education Provided To: Patient Education Topics Provided Wound/Skin Impairment: Methods: Explain/Verbal Responses: Return demonstration correctly Electronic Signature(s) Signed: 01/18/2023 4:38:14 PM By: Karie Schwalbe RN Entered By: Karie Schwalbe on 01/18/2023 16:35:02 Gwynn Burly ([id number]) [id number] Page 6 of 7 -------------------------------------------------------------------------------- Wound Assessment Details Patient Name: Date of Service: [name], [place] RO N 01/18/2023 1:00 PM Medical Record Number: [id number] Patient Account Number: [id number] Date of Birth/Sex: Treating RN: [birth year] (72 y.o. [name] Primary Care Jayliah Benett: Annita Brod Other Clinician: Referring Keaira Whitehurst: Treating Aranda Bihm/Extender: Satira Sark in Treatment: 58 Wound Status Wound Number: 1 Primary Etiology: Pressure Ulcer Wound Location: Right Calcaneus Wound Status: Open Wounding Event: Pressure Injury Comorbid History: Cataracts, Hypertension, Cirrhosis , Hepatitis C Date Acquired: 07/02/2020 Weeks Of Treatment: 58 Clustered Wound: No Photos Wound Measurements Length: (cm) 0.7 Width: (cm) 1.7 Depth: (cm) 0.7 Area: (cm) 0. Volume: (cm) 0. % Reduction in Area: 96.2% % Reduction in Volume: 94.7% Epithelialization: Medium (34-66%) 935 Tunneling: No 654 Undermining: No Wound  Description Classification: Category/Stage IV Wound Margin: Well defined, not attached Exudate Amount: Medium Exudate Type: Serosanguineous Exudate Color: red, brown Foul Odor After Cleansing: No Slough/Fibrino Yes Wound Bed Granulation Amount: Small (1-33%) Exposed Structure Granulation Quality: Pink Fascia Exposed: No Necrotic Amount: Large (67-100%) Fat Layer (Subcutaneous Tissue) Exposed: Yes Necrotic Quality: Eschar, Adherent Slough Tendon Exposed: No Muscle Exposed: No Joint Exposed: No Bone Exposed: No Periwound Skin Texture Texture Color No Abnormalities Noted: No No Abnormalities Noted: Yes Callus: Yes Temperature / Pain Crepitus: No Temperature: No Abnormality Excoriation: No Induration: No Rash: No Scarring: No Moisture No Abnormalities Noted: Yes Treatment Notes CONRADO, KROTZER ([id number]) [id number] Page 7 of 7 Wound #1 (Calcaneus) Wound Laterality: Right Cleanser Soap and Water Discharge Instruction: May shower and wash wound with dial antibacterial soap and water prior to dressing change. Wound Cleanser Discharge Instruction: Cleanse the wound with wound cleanser prior to applying a clean dressing using gauze sponges, not tissue or cotton balls. Peri-Wound Care Topical Primary Dressing Hydrofera Blue Ready Transfer Foam, 4x5 (in/in) Discharge Instruction: Apply to wound bed as instructed Secondary Dressing ALLEVYN Heel 4 1/2in x 5 1/2in / 10.5cm x 13.5cm Discharge Instruction: Apply over primary dressing as directed. Woven Gauze Sponge, Non-Sterile 4x4 in Discharge Instruction: Apply over primary dressing as directed. Secured With Elastic Bandage 4 inch (ACE bandage) Discharge Instruction: Secure with ACE bandage as directed. Kerlix Roll Sterile, 4.5x3.1 (in/yd) Discharge Instruction: Secure with Kerlix as directed. 53M Medipore Soft Cloth Surgical T 2x10 (in/yd) ape Discharge Instruction: Secure with tape as  directed. Compression Wrap Compression Stockings Add-Ons foot cover Discharge Instruction: use foot covers Electronic Signature(s) Signed: 01/18/2023 4:38:14 PM By: Karie Schwalbe RN Entered By: Karie Schwalbe on 01/18/2023 13:22:55 --------------------------------------------------------------------------------  Vitals Details Patient Name: Date of Service: [name], [place] RO N 01/18/2023 1:00 PM Medical Record Number: [id number] Patient Account Number: [id number] Date of Birth/Sex: Treating RN: Dec 31, 1950 (72 y.o. [name] Primary Care Emaley Applin: Annita Brod Other Clinician: Referring Larone Kliethermes: Treating Pennye Beeghly/Extender: Satira Sark in Treatment: 58 Vital Signs Time Taken: 12:50 Temperature (F): 97.8 Height (in): 69 Pulse (bpm): 69 Weight (lbs): 165 Respiratory Rate (breaths/min): 16 Body Mass Index (BMI): 24.4 Blood Pressure (mmHg): 144/78 Reference Range: 80 - 120 mg / dl Electronic Signature(s) Signed: 01/18/2023 4:38:14 PM By: Karie Schwalbe RN Entered By: Karie Schwalbe on 01/18/2023 12:52:03

## 2023-01-18 NOTE — Progress Notes (Signed)
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 did not get his vascular studies for reasons that are unclear. It sounds like perhaps his sister did not understand the importance of these 10/12/2022: The undermined portion has contracted a bit more. There is some callus overlying very thin and tenuous epithelium on the posterior aspect of the heel wound. There is slough accumulation. His vascular studies were performed. His TBIs are normal; ABIs were noncompressible. 11/02/2022: His wound has continued to contract. There is some slough and senescent skin accumulation around the margins of the wound. The undermined portion has come in a bit further. 11/16/2022: His wound is smaller and the undermined portion has contracted further. There is some callus around the perimeter as well as some slough on the remaining exposed surface. Quite a bit of the heel has epithelialized. 12/14/2022: The patient has been unable to come to clinic for about a month secondary to both illness and transportation  difficulties. The wound measured a little deeper on the straight in aspect, but this actually looks as though this is because more healthy tissue has filled in around the wound opening. There is still some undermining present with slough and callus. 12/20/2022: The wound is smaller and the undermining has decreased even further. There is just a little bit of slough present and eschar at the posterior edge. 01/18/2023: The undermining is almost completely obliterated. There is some slough and eschar accumulation. No concern for infection. Patient History Information obtained from Patient. Family History Cancer - Mother,Father, Diabetes - Siblings, Heart Disease - Maternal Grandparents, Hypertension - Mother, Lung Disease - Mother, Stroke - Mother, Thyroid Problems - Siblings, No family history of Hereditary Spherocytosis, Kidney Disease, Seizures, Tuberculosis. 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 Medical History Eyes Patient has history of Cataracts Cardiovascular Patient has history of Hypertension Gastrointestinal Patient has history of Cirrhosis , Hepatitis C - says it is gone Hospitalization/Surgery History - inguinal hernia repair. - knee surgery x4. Medical A Surgical History Notes nd Hematologic/Lymphatic hyperlipidemia Gastrointestinal colon polyps Genitourinary BPH Musculoskeletal arthritis Neurologic stroke Objective Constitutional Slightly hypertensive. no acute distress. Vitals Time Taken: 12:50 PM, Height: 69 in, Weight: 165 lbs, BMI: 24.4, Temperature: 97.8 F, Pulse: 69 bpm, Respiratory Rate: 16 breaths/min, Blood Pressure: 144/78 mmHg. Respiratory Normal work of breathing on room air. General Notes: 01/18/2023: The undermining is almost completely obliterated. There is some slough and eschar accumulation. No concern for  infection. Integumentary (Hair, Skin) Wound #1 status is Open. Original cause of wound was Pressure Injury. The date acquired was: 07/02/2020. The wound has been in treatment 58 weeks. The wound is located on the Right Calcaneus. The wound measures 0.7cm length x 1.7cm width  x 0.7cm depth; 0.935cm^2 area and 0.654cm^3 volume. There is Fat Layer (Subcutaneous Tissue) exposed. There is no tunneling or undermining noted. There is a medium amount of serosanguineous drainage noted. The [place], [name] ([id number]) [id number] Page 8 of 11 wound margin is well defined and not attached to the wound base. There is small (1-33%) pink granulation within the wound bed. There is a large (67-100%) amount of necrotic tissue within the wound bed including Eschar and Adherent Slough. The periwound skin appearance had no abnormalities noted for moisture. The periwound skin appearance had no abnormalities noted for color. The periwound skin appearance exhibited: Callus. The periwound skin appearance did not exhibit: Crepitus, Excoriation, Induration, Rash, Scarring. Periwound temperature was noted as No Abnormality. 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 There was a Selective/Open Wound Non-Viable Tissue Debridement with a total area of 0.93 sq cm performed by [name], MD. With the following instrument(s): Curette to remove Non-Viable tissue/material. Material removed includes Eschar and Slough and after achieving pain control using Lidocaine 5% topical ointment. No specimens were taken. A time out  was conducted at 13:18, prior to the start of the procedure. A Minimum amount of bleeding was controlled with Pressure. The procedure was tolerated well with a pain level of 0 throughout and a pain level of 0 following the procedure. Post Debridement Measurements: 0.7cm length x 1.7cm width x 0.7cm depth; 0.654cm^3 volume. Post debridement Stage noted as Category/Stage IV. Character of Wound/Ulcer Post Debridement is improved. Post procedure Diagnosis Wound #1: Same as Pre-Procedure General Notes: Scribed for Dr. Lady Gary by J.Scotton. Plan Follow-up Appointments: Return Appointment in 2 weeks. - Dr. Lady Gary Room 2 tuesday 02/01/23 at 2:15pm Anesthetic: Wound #1 Right Calcaneus: (In clinic) Topical Lidocaine 4% applied to wound bed Bathing/ Shower/ Hygiene: May shower and wash wound with soap and water. - with dressing changes Off-Loading: Other: - heel offloading sandal, avoid pressure to back of the heel with pillows under calves to float heels Home Health: No change in wound care orders this week; continue Home Health for wound care. May utilize formulary equivalent dressing for wound treatment orders unless otherwise specified. - slide hydrofera blue into undermined area Dressing changes to be completed by Home Health on Monday / Wednesday / Friday except when patient has scheduled visit at [facility]. Other Home Health Orders/Instructions: - [facility] 3x a week WOUND #1: - Calcaneus Wound Laterality: Right Cleanser: Soap and Water 3 x Per Week/30 Days Discharge Instructions: May shower and wash wound with dial antibacterial soap and water prior to dressing change. Cleanser: Wound Cleanser 3 x Per Week/30 Days Discharge Instructions: Cleanse the wound with wound cleanser prior to applying a clean dressing using gauze sponges, not tissue or cotton balls. Prim Dressing: Hydrofera Blue Ready Transfer Foam, 4x5 (in/in) 3 x Per Week/30 Days ary Discharge Instructions: Apply to wound  bed as instructed Secondary Dressing: ALLEVYN Heel 4 1/2in x 5 1/2in / 10.5cm x 13.5cm (Generic) 3 x Per Week/30 Days Discharge Instructions: Apply over primary dressing as directed. Secondary Dressing: Woven Gauze Sponge, Non-Sterile 4x4 in 3 x Per Week/30 Days Discharge Instructions: Apply over primary dressing as directed. Secured With: Elastic Bandage 4 inch (ACE bandage) (Generic) 3 x  Per Week/30 Days Discharge Instructions: Secure with ACE bandage as directed. Secured With: [employer], 4.5x3.1 (in/yd) (Generic) 3 x Per Week/30 Days Discharge Instructions: Secure with Kerlix as directed. Secured With: 9M Medipore Scientist, research (life sciences) Surgical T 2x10 (in/yd) (Generic) 3 x Per Week/30 Days ape Discharge Instructions: Secure with tape as directed. Add-Ons: foot cover (Generic) 3 x Per Week/30 Days Discharge Instructions: use foot covers 01/18/2023: The undermining is almost completely obliterated. There is some slough and eschar accumulation. No concern for infection. I used a curette to debride slough and eschar from his wound. We will continue to use Hydrofera Blue, packing it into the residual undermined portion, as well as placing it over the open wound bed. He will follow-up in 2 weeks. [place], [name] ([id number]) [id number] Page 9 of 11 Electronic Signature(s) Signed: 01/18/2023 1:47:19 PM By: [name] MD FACS Previous Signature: 01/18/2023 1:46:56 PM Version By: [name] MD FACS Entered By: [name] on 01/18/2023 13:47:18 -------------------------------------------------------------------------------- HxROS Details Patient Name: Date of Service: [name], [name] 01/18/2023 1:00 PM Medical Record Number: [id number] Patient Account Number: [id number] Date of Birth/Sex: Treating RN: July 05, 1951 (72 y.o. M) Primary Care Provider: Annita Brod Other Clinician: Referring Provider: Treating Provider/Extender: Satira Sark in Treatment: 48 Information Obtained From Patient Eyes Medical History: Positive for: Cataracts Hematologic/Lymphatic Medical History: Past Medical History Notes: hyperlipidemia Cardiovascular Medical History: Positive for: Hypertension Gastrointestinal Medical History: Positive for: Cirrhosis ; Hepatitis C - says it is gone Past Medical History Notes: colon polyps Genitourinary Medical History: Past Medical History Notes: BPH Musculoskeletal Medical History: Past Medical History Notes: arthritis Neurologic Medical History: Past Medical History Notes: stroke HBO Extended History Items Eyes: Cataracts Immunizations Pneumococcal Vaccine: Received Pneumococcal Vaccination: Yes Received Pneumococcal Vaccination On or After 60th Birthday: Yes Implantable Devices [place], [name] ([id number]) [id number] Page 10 of 11 None Hospitalization / Surgery History Type of Hospitalization/Surgery inguinal hernia repair knee surgery x4 Family and Social History Cancer: Yes - Mother,Father; Diabetes: Yes - Siblings; Heart Disease: Yes - Maternal Grandparents; Hereditary Spherocytosis: No; Hypertension: Yes - Mother; Kidney Disease: No; Lung Disease: Yes - Mother; Seizures: No; Stroke: Yes - Mother; Thyroid Problems: Yes - Siblings; Tuberculosis: No; Former smoker - ended on 09/25/1982; Marital Status - Single; Alcohol Use: Never; Drug Use: Prior History - cocaine, heroin, marijuana; Caffeine Use: Never; Financial Concerns: No; Food, Clothing or Shelter Needs: No; Support System Lacking: Yes; Transportation Concerns: No Electronic Signature(s) Signed: 01/18/2023 2:59:01 PM By: [name] MD FACS Entered By: [name] on 01/18/2023 13:42:42 -------------------------------------------------------------------------------- SuperBill Details Patient Name: Date of Service: [name], [name]  01/18/2023 Medical Record Number: [id number] Patient Account Number: [id number] Date of Birth/Sex: Treating RN: [birth year] (71 y.o. M) Primary Care Provider: Annita Brod Other Clinician: Referring Provider: Treating Provider/Extender: Satira Sark in Treatment: 58 Diagnosis Coding ICD-10 Codes Code Description L89.610 Pressure ulcer of right heel, unstageable Z86.73 Personal history of transient ischemic attack (TIA), and cerebral infarction without residual deficits K74.60 Unspecified cirrhosis of liver B18.2 Chronic viral hepatitis C I63.9 Cerebral infarction, unspecified I10 Essential (primary) hypertension Facility Procedures : CPT4 Code: [id number] Description: 97597 - DEBRIDE WOUND 1ST 20 SQ CM OR < ICD-10 Diagnosis Description L89.610 Pressure ulcer of right heel, unstageable Modifier: Quantity: 1 Physician Procedures : CPT4 Code Description Modifier [id number] - WC PHYS LEVEL 4 - EST PT 25 ICD-10 Diagnosis Description L89.610 Pressure ulcer of right heel, unstageable Z86.73 Personal history  of transient ischemic attack (TIA), and cerebral infarction without  residual deficits K74.60 Unspecified cirrhosis of liver B18.2 Chronic viral hepatitis C Quantity: 1 : [id number] - WC PHYS DEBR WO ANESTH 20 SQ CM ICD-10 Diagnosis Description L89.610 Pressure ulcer of right heel, unstageable Quantity: 1 Electronic Signature(s) Signed: 01/18/2023 1:47:42 PM By: [name] MD FACS Entered By: [name] on 01/18/2023 13:47:41 Gwynn Burly ([id number]) [id number] Page 11 of 11

## 2023-01-31 NOTE — Progress Notes (Signed)
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 that are unclear. It sounds like perhaps his sister did not understand the importance of these 10/12/2022: The undermined portion has contracted a bit more. There is some callus overlying very thin and tenuous epithelium on the posterior aspect of the heel wound. There is slough accumulation. His vascular studies were performed. His TBIs are normal; ABIs were noncompressible. 11/02/2022: His wound has continued to contract. There is some slough and senescent skin accumulation around the margins of the wound. The undermined portion has come in a bit further. 11/16/2022: His wound is smaller and the undermined portion has contracted further. There is some callus around the perimeter as well as some slough on the remaining exposed surface. Quite a bit of the heel has epithelialized. 12/14/2022: The patient has been unable to come to clinic for about a month secondary to both illness and transportation  difficulties. The wound measured a little deeper on the straight in aspect, but this actually looks as though this is because more healthy tissue has filled in and around the wound opening. There is still some undermining present with slough and callus. 12/20/2022: The wound is smaller and the undermining has decreased even further. There is just a little bit of slough present and eschar at the posterior edge. Patient History Information obtained from Patient. Family History Cancer - Mother,Father, Diabetes - Siblings, Heart Disease - Maternal Grandparents, Hypertension - Mother, Lung Disease - Mother, Stroke - Mother, Thyroid Problems - Siblings, No family history of Hereditary Spherocytosis, Kidney Disease, Seizures, Tuberculosis. 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 Medical History Eyes Patient has history of Cataracts Cardiovascular Patient has history of Hypertension Gastrointestinal Patient has history of Cirrhosis , Hepatitis C - says it is gone Hospitalization/Surgery History - inguinal hernia repair. - knee surgery x4. Medical A Surgical History Notes nd Hematologic/Lymphatic hyperlipidemia Gastrointestinal colon polyps Genitourinary BPH Musculoskeletal arthritis Neurologic stroke Objective Constitutional no acute distress. Vitals Time Taken: 10:08 AM, Height: 69 in, Weight: 165 lbs, BMI: 24.4, Temperature: 97.8 F, Pulse: 61 bpm, Respiratory Rate: 18 breaths/min, Blood Pressure: 138/73 mmHg. Respiratory Normal work of breathing on room air. General Notes: 12/20/2022: The wound is smaller and the undermining has decreased even further. There is just a little bit of slough present and eschar at the posterior edge. Integumentary (Hair, Skin) Wound #1 status is Open. Original cause of wound was Pressure Injury. The date acquired was:  07/02/2020. The wound has been in treatment 55 weeks. The wound is located on the Right Calcaneus. The wound measures 0.7cm length x 1.7cm width x 0.8cm depth;  0.935cm^2 area and 0.748cm^3 volume. There is Fat Layer (Subcutaneous Tissue) exposed. There is no tunneling noted. There is a medium amount of serosanguineous drainage noted. The wound margin is well defined and not attached to the wound base. There is large (67-100%) red, pink granulation within the wound bed. There is a small (1-33%) amount of [place], [name] ([id number]) [id number] Page 8 of 11 necrotic tissue within the wound bed including Adherent Slough. The periwound skin appearance had no abnormalities noted for moisture. The periwound skin appearance had no abnormalities noted for color. The periwound skin appearance exhibited: Callus. The periwound skin appearance did not exhibit: Crepitus, Excoriation, Induration, Rash, Scarring. Periwound temperature was noted as No Abnormality. 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 There was a Selective/Open Wound Non-Viable Tissue Debridement with a total area of 0.93 sq cm performed by [name], MD. With the following instrument(s): Curette to remove Non-Viable tissue/material. Material removed includes Eschar and Slough and after achieving pain control using Lidocaine 4% T opical Solution. No specimens were taken. A time out was conducted at 10:20, prior to the start of the procedure. A Minimum amount of bleeding was controlled with Pressure. The procedure was tolerated well.  Post Debridement Measurements: 0.7cm length x 1.7cm width x 0.8cm depth; 0.748cm^3 volume. Post debridement Stage noted as Category/Stage IV. Character of Wound/Ulcer Post Debridement is improved. Post procedure Diagnosis Wound #1: Same as Pre-Procedure General Notes: scribed for Dr. Lady Gary by Samuella Bruin, RN. Plan Follow-up Appointments: Return Appointment in 2 weeks. - Dr. Lady Gary Room 3 Anesthetic: Wound #1 Right Calcaneus: (In clinic) Topical Lidocaine 4% applied to wound bed Bathing/ Shower/ Hygiene: May shower and wash wound with soap and water. - with dressing changes Off-Loading: Other: - heel offloading sandal, avoid pressure to back of the heel with pillows under calves to float heels Home Health: No change in wound care orders this week; continue Home Health for wound care. May utilize formulary equivalent dressing for wound treatment orders unless otherwise specified. - slide hydrofera blue into undermined area Dressing changes to be completed by Home Health on Monday / Wednesday / Friday except when patient has scheduled visit at [facility]. Other Home Health Orders/Instructions: - [facility] 3x a week The following medication(s) was prescribed: lidocaine topical 4 % cream cream topical was prescribed at facility WOUND #1: - Calcaneus Wound Laterality: Right Cleanser: Soap and Water 3 x Per Week/30 Days Discharge Instructions: May shower and wash wound with dial antibacterial soap and water prior to dressing change. Cleanser: Wound Cleanser 3 x Per Week/30 Days Discharge Instructions: Cleanse the wound with wound cleanser prior to applying a clean dressing using gauze sponges, not tissue or cotton balls. Prim Dressing: Hydrofera Blue Ready Transfer Foam, 4x5 (in/in) 3 x Per Week/30 Days ary Discharge Instructions: Apply to wound bed as instructed Secondary Dressing: ALLEVYN Heel 4 1/2in x 5 1/2in / 10.5cm x 13.5cm (Generic) 3 x Per Week/30 Days Discharge  Instructions: Apply over primary dressing as directed. Secondary Dressing: Woven Gauze Sponge, Non-Sterile 4x4 in 3 x Per Week/30 Days Discharge Instructions: Apply over primary dressing as directed. Secured With: Elastic Bandage 4 inch (ACE bandage) (Generic) 3 x Per Week/30 Days Discharge Instructions: Secure with  ACE bandage as directed. Secured With: [employer], 4.5x3.1 (in/yd) (Generic) 3 x Per Week/30 Days Discharge Instructions: Secure with Kerlix as directed. Secured With: 64M Medipore Scientist, research (life sciences) Surgical T 2x10 (in/yd) (Generic) 3 x Per Week/30 Days ape Discharge Instructions: Secure with tape as directed. Add-Ons: foot cover (Generic) 3 x Per Week/30 Days Discharge Instructions: use foot covers 12/20/2022: The wound is smaller and the undermining has decreased even further. There is just a little bit of slough present and eschar at the posterior edge. I used a curette to debride slough and eschar from his wound. We will continue to use Hydrofera Blue, packing it into the undermining and apply it to the surface of the wound. The foot was wrapped with Kerlix and Coban and he is in a heel offloading shoe. Follow-up in 2 weeks. [name], [name] ([id number]) [id number] Page 9 of 11 Electronic Signature(s) Signed: 01/31/2023 2:52:33 PM By: Pearletha Alfred Signed: 01/31/2023 2:55:18 PM By: [name] MD FACS Previous Signature: 12/28/2022 10:27:07 AM Version By: [name] MD FACS Entered By: Pearletha Alfred on 01/31/2023 14:52:33 -------------------------------------------------------------------------------- HxROS Details Patient Name: Date of Service: [name], [name] 12/28/2022 10:00 A M Medical Record Number: [id number] Patient Account Number: [id number] Date of Birth/Sex: Treating RN: [birth year] (72 y.o. M) Primary Care Provider: Annita Brod Other Clinician: Referring Provider: Treating Provider/Extender: Satira Sark in Treatment: 77 Information Obtained From Patient Eyes Medical History: Positive for: Cataracts Hematologic/Lymphatic Medical History: Past Medical History Notes: hyperlipidemia Cardiovascular Medical History: Positive for: Hypertension Gastrointestinal Medical History: Positive for: Cirrhosis ; Hepatitis C - says it is gone Past Medical History Notes: colon polyps Genitourinary Medical History: Past Medical History Notes: BPH Musculoskeletal Medical History: Past Medical History Notes: arthritis Neurologic Medical History: Past Medical History Notes: stroke HBO Extended History Items Eyes: Cataracts Immunizations Pneumococcal Vaccine: Received Pneumococcal Vaccination: Yes Received Pneumococcal Vaccination On or After [address][name], [name] ([id number]) [id number] Page 10 of 11 Implantable Devices None Hospitalization / Surgery History Type of Hospitalization/Surgery inguinal hernia repair knee surgery x4 Family and Social History Cancer: Yes - Mother,Father; Diabetes: Yes - Siblings; Heart Disease: Yes - Maternal Grandparents; Hereditary Spherocytosis: No; Hypertension: Yes - Mother; Kidney Disease: No; Lung Disease: Yes - Mother; Seizures: No; Stroke: Yes - Mother; Thyroid Problems: Yes - Siblings; Tuberculosis: No; Former smoker - ended on 09/25/1982; Marital Status - Single; Alcohol Use: Never; Drug Use: Prior History - cocaine, heroin, marijuana; Caffeine Use: Never; Financial Concerns: No; Food, Clothing or Shelter Needs: No; Support System Lacking: Yes; Transportation Concerns: No Electronic Signature(s) Signed: 12/28/2022 10:32:41 AM By: [name] MD FACS Entered By: [name] on 12/28/2022 10:25:41 -------------------------------------------------------------------------------- SuperBill Details Patient Name: Date of Service: [name] RO N 12/28/2022 Medical Record Number:  [id number] Patient Account Number: [id number] Date of Birth/Sex: Treating RN: 1951/06/02 (72 y.o. M) Primary Care Provider: Annita Brod Other Clinician: Referring Provider: Treating Provider/Extender: Satira Sark in Treatment: 55 Diagnosis Coding ICD-10 Codes Code Description L89.610 Pressure ulcer of right heel, unstageable Z86.73 Personal history of transient ischemic attack (TIA), and cerebral infarction without residual deficits K74.60 Unspecified cirrhosis of liver B18.2 Chronic viral hepatitis C I63.9 Cerebral infarction, unspecified I10 Essential (primary) hypertension Facility Procedures : CPT4 Code: [id number] Description: 97597 - DEBRIDE WOUND 1ST 20 SQ CM OR < ICD-10 Diagnosis Description L89.610 Pressure ulcer of right heel, unstageable Modifier: Quantity: 1 Physician Procedures : CPT4 Code Description Modifier [id number] [phone] -  WC PHYS LEVEL 4 - EST PT 25 ICD-10 Diagnosis Description L89.610 Pressure ulcer of right heel, unstageable Z86.73 Personal history of transient ischemic attack (TIA), and cerebral infarction without  residual deficits K74.60 Unspecified cirrhosis of liver I10 Essential (primary) hypertension Quantity: 1 : [id number] 97597 - WC PHYS DEBR WO ANESTH 20 SQ CM ICD-10 Diagnosis Description L89.610 Pressure ulcer of right heel, unstageable Quantity: 1 Electronic Signature(s) Signed: 12/28/2022 10:27:38 AM By: [name] MD FACS [name], [name] ([id number]) By: [name] MD FACS [phone].pdf Page 11 of 11 Signed: 12/28/2022 10:27:38 AM Entered By: [name] on 12/28/2022 10:27:38

## 2023-01-31 NOTE — Progress Notes (Signed)
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 understand the importance of these 10/12/2022: The undermined portion has contracted a bit more. There is some callus overlying very thin and tenuous epithelium on the posterior aspect of the heel wound. There is slough accumulation. His vascular studies were performed. His TBIs are normal; ABIs were noncompressible. 11/02/2022: His wound has continued to contract. There is some slough and senescent skin accumulation around the margins of the wound. The undermined portion has come in a bit further. 11/16/2022: His wound is smaller and the undermined portion has contracted further. There is some callus around the perimeter as well as some slough on the remaining exposed surface. Quite a bit of the heel has epithelialized. 12/14/2022: The patient has been unable to come to clinic for about a month secondary to both illness and transportation difficulties. The wound measured a little deeper on the  straight in aspect, but this actually looks as though this is because more healthy tissue has filled in and around the wound opening. There is still some undermining present with slough and callus. Patient History Information obtained from Patient. Family History Cancer - Mother,Father, Diabetes - Siblings, Heart Disease - Maternal Grandparents, Hypertension - Mother, Lung Disease - Mother, Stroke - Mother, Thyroid Problems - Siblings, No family history of Hereditary Spherocytosis, Kidney Disease, Seizures, Tuberculosis. 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 Medical History Eyes Patient has history of Cataracts Cardiovascular Patient has history of Hypertension Gastrointestinal Patient has history of Cirrhosis , Hepatitis C - says it is gone Hospitalization/Surgery History - inguinal hernia repair. - knee surgery x4. Medical A Surgical History Notes nd Hematologic/Lymphatic hyperlipidemia Gastrointestinal colon polyps Genitourinary BPH Musculoskeletal arthritis Neurologic stroke Objective Constitutional Slightly hypertensive. no acute distress. Vitals Time Taken: 9:27 AM, Height: 69 in, Weight: 165 lbs, BMI: 24.4, Temperature: 98.0 F, Pulse: 62 bpm, Respiratory Rate: 20 breaths/min, Blood Pressure: 149/82 mmHg. Respiratory Normal work of breathing on room air. General Notes: 12/14/2022: The wound measured a little deeper on the straight in aspect, but this actually looks as though this is because more healthy tissue has filled in and around the wound opening. There is still some undermining present with slough and callus. Integumentary (Hair, Skin) Wound #1 status is Open. Original cause of wound was Pressure Injury. The date acquired was: 07/02/2020. The wound has been in treatment 53 weeks. The wound is located on the Right Calcaneus. The  wound measures 0.6cm length x 1.3cm width x 1cm depth; 0.613cm^2 area and 0.613cm^3 volume. There is Fat Layer (Subcutaneous Tissue) exposed. There is no tunneling or undermining noted. There is  a medium amount of serosanguineous drainage noted. The wound margin is well defined and not attached to the wound base. There is large (67-100%) red, pink granulation within the wound bed. There is a small (1-33%) amount of necrotic tissue within the wound bed including Adherent Slough. The periwound skin appearance had no abnormalities noted for moisture. The periwound skin appearance had no abnormalities noted for color. The periwound skin appearance exhibited: Callus. The periwound skin appearance did not exhibit: [name], [name], [name] ([id number]) [id number] Page 8 of 11 Excoriation, Induration, Rash, Scarring. Periwound temperature was noted as No Abnormality. 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 There was a Selective/Open Wound Non-Viable Tissue Debridement with a total area of 0.61 sq cm performed by [name], MD. With the following instrument(s): Curette to remove Non-Viable tissue/material. Material removed includes Callus and Slough and after achieving pain control using Lidocaine 4% T opical Solution. No specimens were taken. A time out was conducted at 09:40, prior to the start of the procedure. A Minimum amount of bleeding was controlled with Pressure. The procedure was tolerated well. Post Debridement Measurements: 0.6cm length x 1.3cm width x 1cm depth; 0.613cm^3 volume. Post  debridement Stage noted as Category/Stage IV. Character of Wound/Ulcer Post Debridement is improved. Post procedure Diagnosis Wound #1: Same as Pre-Procedure General Notes: scribed for Dr. Lady Gary by Samuella Bruin, RN. Plan Follow-up Appointments: Return Appointment in 2 weeks. - Dr. Lady Gary Room 3 Anesthetic: Wound #1 Right Calcaneus: (In clinic) Topical Lidocaine 4% applied to wound bed Bathing/ Shower/ Hygiene: May shower and wash wound with soap and water. - with dressing changes Off-Loading: Other: - heel offloading sandal, avoid pressure to back of the heel with pillows under calves to float heels Home Health: No change in wound care orders this week; continue Home Health for wound care. May utilize formulary equivalent dressing for wound treatment orders unless otherwise specified. - slide hydrofera blue into undermined area Dressing changes to be completed by Home Health on Monday / Wednesday / Friday except when patient has scheduled visit at [facility]. Other Home Health Orders/Instructions: - [facility] 3x a week The following medication(s) was prescribed: lidocaine topical 4 % cream cream topical was prescribed at facility WOUND #1: - Calcaneus Wound Laterality: Right Cleanser: Soap and Water 3 x Per Week/30 Days Discharge Instructions: May shower and wash wound with dial antibacterial soap and water prior to dressing change. Cleanser: Wound Cleanser 3 x Per Week/30 Days Discharge Instructions: Cleanse the wound with wound cleanser prior to applying a clean dressing using gauze sponges, not tissue or cotton balls. Prim Dressing: Hydrofera Blue Ready Transfer Foam, 4x5 (in/in) 3 x Per Week/30 Days ary Discharge Instructions: Apply to wound bed as instructed Secondary Dressing: ALLEVYN Heel 4 1/2in x 5 1/2in / 10.5cm x 13.5cm (Generic) 3 x Per Week/30 Days Discharge Instructions: Apply over primary dressing as directed. Secondary Dressing: Woven Gauze Sponge,  Non-Sterile 4x4 in 3 x Per Week/30 Days Discharge Instructions: Apply over primary dressing as directed. Secured With: Elastic Bandage 4 inch (ACE bandage) (Generic) 3 x Per Week/30 Days Discharge Instructions: Secure with ACE bandage as directed. Secured With: [employer], 4.5x3.1 (in/yd) (Generic) 3 x Per Week/30 Days Discharge Instructions:  Secure with Kerlix as directed. Secured With: 45M Medipore Scientist, research (life sciences) Surgical T 2x10 (in/yd) (Generic) 3 x Per Week/30 Days ape Discharge Instructions: Secure with tape as directed. Add-Ons: foot cover (Generic) 3 x Per Week/30 Days Discharge Instructions: use foot covers 12/14/2022: The wound measured a little deeper on the straight in aspect, but this actually looks as though this is because more healthy tissue has filled in and around the wound opening. There is still some undermining present with slough and callus. I used a curette to debride slough and callus from his wound. We will continue Hydrofera Blue with Kerlix and Ace wrap. Follow-up in 2 weeks. [name], [name] ([id number]) [id number] Page 9 of 11 Electronic Signature(s) Signed: 01/31/2023 2:52:08 PM By: Pearletha Alfred Signed: 01/31/2023 2:55:18 PM By: [name] MD FACS Previous Signature: 12/14/2022 9:56:25 AM Version By: [name] MD FACS Previous Signature: 12/14/2022 9:49:54 AM Version By: [name] MD FACS Entered By: Pearletha Alfred on 01/31/2023 14:52:08 -------------------------------------------------------------------------------- HxROS Details Patient Name: Date of Service: [name], [name] 12/14/2022 9:15 A M Medical Record Number: [id number] Patient Account Number: [id number] Date of Birth/Sex: Treating RN: Dec 08, 1950 (72 y.o. M) Primary Care Provider: Annita Brod Other Clinician: Referring Provider: Treating Provider/Extender: Satira Sark in Treatment: 47 Information Obtained  From Patient Eyes Medical History: Positive for: Cataracts Hematologic/Lymphatic Medical History: Past Medical History Notes: hyperlipidemia Cardiovascular Medical History: Positive for: Hypertension Gastrointestinal Medical History: Positive for: Cirrhosis ; Hepatitis C - says it is gone Past Medical History Notes: colon polyps Genitourinary Medical History: Past Medical History Notes: BPH Musculoskeletal Medical History: Past Medical History Notes: arthritis Neurologic Medical History: Past Medical History Notes: stroke HBO Extended History Items Eyes: Cataracts Immunizations Pneumococcal Vaccine: Received Pneumococcal Vaccination: Yes Received Pneumococcal Vaccination On or After [address][name], [name] ([id number]) [id number] Page 10 of 11 Implantable Devices None Hospitalization / Surgery History Type of Hospitalization/Surgery inguinal hernia repair knee surgery x4 Family and Social History Cancer: Yes - Mother,Father; Diabetes: Yes - Siblings; Heart Disease: Yes - Maternal Grandparents; Hereditary Spherocytosis: No; Hypertension: Yes - Mother; Kidney Disease: No; Lung Disease: Yes - Mother; Seizures: No; Stroke: Yes - Mother; Thyroid Problems: Yes - Siblings; Tuberculosis: No; Former smoker - ended on 09/25/1982; Marital Status - Single; Alcohol Use: Never; Drug Use: Prior History - cocaine, heroin, marijuana; Caffeine Use: Never; Financial Concerns: No; Food, Clothing or Shelter Needs: No; Support System Lacking: Yes; Transportation Concerns: No Electronic Signature(s) Signed: 12/14/2022 10:05:22 AM By: [name] MD FACS Entered By: [name] on 12/14/2022 09:47:44 -------------------------------------------------------------------------------- SuperBill Details Patient Name: Date of Service: [name], [name] 12/14/2022 Medical Record Number: [id number] Patient Account Number: [id number] Date  of Birth/Sex: Treating RN: [birth year] (72 y.o. M) Primary Care Provider: Annita Brod Other Clinician: Referring Provider: Treating Provider/Extender: Satira Sark in Treatment: 53 Diagnosis Coding ICD-10 Codes Code Description L89.610 Pressure ulcer of right heel, unstageable Z86.73 Personal history of transient ischemic attack (TIA), and cerebral infarction without residual deficits K74.60 Unspecified cirrhosis of liver B18.2 Chronic viral hepatitis C I63.9 Cerebral infarction, unspecified I10 Essential (primary) hypertension Facility Procedures : CPT4 Code: [id number] Description: 97597 - DEBRIDE WOUND 1ST 20 SQ CM OR < ICD-10 Diagnosis Description L89.610 Pressure ulcer of right heel, unstageable Modifier: Quantity: 1 Physician Procedures : CPT4 Code Description Modifier [id number] - WC PHYS LEVEL 3 - EST PT 25 ICD-10 Diagnosis Description L89.610 Pressure ulcer of right heel, unstageable I10 Essential (  primary) hypertension K74.60 Unspecified cirrhosis of liver Z86.73 Personal history  of transient ischemic attack (TIA), and cerebral infarction without residual deficits Quantity: 1 : [id number] 97597 - WC PHYS DEBR WO ANESTH 20 SQ CM ICD-10 Diagnosis Description L89.610 Pressure ulcer of right heel, unstageable Quantity: 1 Electronic Signature(s) Signed: 12/14/2022 9:56:58 AM By: [name] MD FACS [name], [name] ([id number]) [name] MD FACS [id number].pdf Page 11 of 11 Signed: 12/14/2022 9:56:58 AM By: Margaret Pyle By: [name] on 12/14/2022 09:56:58

## 2023-02-01 ENCOUNTER — Encounter (HOSPITAL_BASED_OUTPATIENT_CLINIC_OR_DEPARTMENT_OTHER): Payer: 59 | Attending: General Surgery | Admitting: General Surgery

## 2023-02-01 DIAGNOSIS — Z8616 Personal history of COVID-19: Secondary | ICD-10-CM | POA: Insufficient documentation

## 2023-02-01 DIAGNOSIS — L89610 Pressure ulcer of right heel, unstageable: Secondary | ICD-10-CM | POA: Diagnosis present

## 2023-02-01 DIAGNOSIS — I6930 Unspecified sequelae of cerebral infarction: Secondary | ICD-10-CM | POA: Diagnosis not present

## 2023-02-01 DIAGNOSIS — B182 Chronic viral hepatitis C: Secondary | ICD-10-CM | POA: Diagnosis not present

## 2023-02-01 DIAGNOSIS — K7460 Unspecified cirrhosis of liver: Secondary | ICD-10-CM | POA: Diagnosis not present

## 2023-02-01 DIAGNOSIS — I10 Essential (primary) hypertension: Secondary | ICD-10-CM | POA: Insufficient documentation

## 2023-02-01 NOTE — Progress Notes (Signed)
[name] ([id number]) [id number] Page 1 of 8 Visit Report for 02/01/2023 Arrival Information Details Patient Name: Date of Service: [name], [place] RO N 02/01/2023 2:00 PM Medical Record Number: [id number] Patient Account Number: [id number] Date of Birth/Sex: Treating RN: [birth year] (72 y.o. Cline Cools Primary Care Arria Naim: Annita Brod Other Clinician: Referring Bronwyn Belasco: Treating Zoella Roberti/Extender: Satira Sark in Treatment: 60 Visit Information History Since Last Visit Added or deleted any medications: No Patient Arrived: Wheel Chair Any new allergies or adverse reactions: No Arrival Time: 14:19 Had a fall or experienced change in No Accompanied By: sister activities of daily living that may affect Transfer Assistance: None risk of falls: Patient Identification Verified: Yes Signs or symptoms of abuse/neglect since last visito No Secondary Verification Process Completed: Yes Hospitalized since last visit: No Patient Requires Transmission-Based Precautions: No Implantable device outside of the clinic excluding No Patient Has Alerts: Yes cellular tissue based products placed in the center Patient Alerts: Patient on Blood Thinner since last visit: Has Dressing in Place as Prescribed: Yes Pain Present Now: No Electronic Signature(s) Signed: 02/01/2023 4:25:24 PM By: [name] Pulling RN, BSN Entered By: [name] Pulling on 02/01/2023 14:20:11 -------------------------------------------------------------------------------- Encounter Discharge Information Details Patient Name: Date of Service: [name], [name] 02/01/2023 2:00 PM Medical Record Number: [id number] Patient Account Number: [id number] Date of Birth/Sex: Treating RN: [birth year] (72 y.o. Cline Cools Primary Care Gaspare Netzel: Annita Brod Other Clinician: Referring Corie Vavra: Treating [name]/Extender: Satira Sark in  Treatment: 44 Encounter Discharge Information Items Post Procedure Vitals Discharge Condition: Stable Temperature (F): 97.6 Ambulatory Status: Wheelchair Pulse (bpm): 63 Discharge Destination: Home Respiratory Rate (breaths/min): 18 Transportation: Private Auto Blood Pressure (mmHg): 166/74 Accompanied By: sister Schedule Follow-up Appointment: Yes Clinical Summary of Care: Patient Declined Electronic Signature(s) Signed: 02/01/2023 4:25:24 PM By: [name] Pulling RN, BSN Entered By: [name] Pulling on 02/01/2023 15:06:34 Gwynn Burly ([id number]) [id number] Page 2 of 8 -------------------------------------------------------------------------------- Lower Extremity Assessment Details Patient Name: Date of Service: [name], [place] RO N 02/01/2023 2:00 PM Medical Record Number: [id number] Patient Account Number: [id number] Date of Birth/Sex: Treating RN: 06-27-1951 (72 y.o. Cline Cools Primary Care [name]: Annita Brod Other Clinician: Referring Oris Calmes: Treating Christopher Glasscock/Extender: Satira Sark in Treatment: 60 [name]: No] [name]: No] Edema: [Left: N] [Right: o] Calf Left: Right: Point of Measurement: From Medial Instep 35 cm Ankle Left: Right: Point of Measurement: From Medial Instep 24 cm Electronic Signature(s) Signed: 02/01/2023 4:25:24 PM By: [name] Pulling RN, BSN Entered By: [name] Pulling on 02/01/2023 14:21:55 -------------------------------------------------------------------------------- Multi Wound Chart Details Patient Name: Date of Service: [name], [name] 02/01/2023 2:00 PM Medical Record Number: [id number] Patient Account Number: [id number] Date of Birth/Sex: Treating RN: 1951/01/10 (72 y.o. M) Primary Care [name]: Annita Brod Other Clinician: Referring Abbigaile Rockman: Treating [name]/Extender: Satira Sark in Treatment: 60 Vital  Signs Height(in): 69 Pulse(bpm): 63 Weight(lbs): 165 Blood Pressure(mmHg): 166/74 Body Mass Index(BMI): 24.4 Temperature(F): 97.6 Respiratory Rate(breaths/min): 18 [1:Photos:] [N/A:N/A] Right Calcaneus N/A N/A Wound Location: Pressure Injury N/A N/A Wounding Event: Pressure Ulcer N/A N/A Primary Etiology: Cataracts, Hypertension, Cirrhosis , N/A N/A Comorbid History: Hepatitis C 07/02/2020 N/A N/A Date Acquired: [place], [name] ([id number]) [id number] Page 3 of 8 60 N/A N/A Weeks of Treatment: Open N/A N/A Wound Status: No N/A N/A Wound Recurrence: 1.5x2x1.4 N/A N/A Measurements L x W x D (cm) 2.356 N/A N/A A (cm) :  rea 3.299 N/A N/A Volume (cm) : 90.50% N/A N/A % Reduction in A rea: 73.30% N/A N/A % Reduction in Volume: Category/Stage IV N/A N/A Classification: Medium N/A N/A Exudate A mount: Serosanguineous N/A N/A Exudate Type: red, brown N/A N/A Exudate Color: Well defined, not attached N/A N/A Wound Margin: Medium (34-66%) N/A N/A Granulation A mount: Pink N/A N/A Granulation Quality: Medium (34-66%) N/A N/A Necrotic A mount: Eschar, Adherent Slough N/A N/A Necrotic Tissue: Fat Layer (Subcutaneous Tissue): Yes N/A N/A Exposed Structures: Fascia: No Tendon: No Muscle: No Joint: No Bone: No Medium (34-66%) N/A N/A Epithelialization: Debridement - Selective/Open Wound N/A N/A Debridement: Pre-procedure Verification/Time Out 14:40 N/A N/A Taken: Lidocaine 4% Topical Solution N/A N/A Pain Control: Slough N/A N/A Tissue Debrided: Skin/Epidermis N/A N/A Level: 2.36 N/A N/A Debridement A (sq cm): rea Curette N/A N/A Instrument: Minimum N/A N/A Bleeding: Pressure N/A N/A Hemostasis A chieved: Procedure was tolerated well N/A N/A Debridement Treatment Response: 1.5x2x1.4 N/A N/A Post Debridement Measurements L x W x D (cm) 3.299 N/A N/A Post Debridement Volume: (cm) Category/Stage IV N/A N/A Post  Debridement Stage: Callus: Yes N/A N/A Periwound Skin Texture: Excoriation: No Induration: No Crepitus: No Rash: No Scarring: No Maceration: No N/A N/A Periwound Skin Moisture: Dry/Scaly: No Atrophie Blanche: No N/A N/A Periwound Skin Color: Cyanosis: No Ecchymosis: No Erythema: No Hemosiderin Staining: No Mottled: No Pallor: No Rubor: No No Abnormality N/A N/A Temperature: Debridement N/A N/A Procedures Performed: Treatment Notes Wound #1 (Calcaneus) Wound Laterality: Right Cleanser Soap and Water Discharge Instruction: May shower and wash wound with dial antibacterial soap and water prior to dressing change. Wound Cleanser Discharge Instruction: Cleanse the wound with wound cleanser prior to applying a clean dressing using gauze sponges, not tissue or cotton balls. Peri-Wound Care Topical Primary Dressing Hydrofera Blue Ready Transfer Foam, 4x5 (in/in) Discharge Instruction: Apply to wound bed as instructed Secondary Dressing ALLEVYN Heel 4 1/2in x 5 1/2in / 10.5cm x 13.5cm Discharge Instruction: Apply over primary dressing as directed. Woven Gauze Sponge, Non-Sterile 4x4 in Discharge Instruction: Apply over primary dressing as directed. [place], [name] ([id number]) [id number] Page 4 of 8 Secured With Elastic Bandage 4 inch (ACE bandage) Discharge Instruction: Secure with ACE bandage as directed. Kerlix Roll Sterile, 4.5x3.1 (in/yd) Discharge Instruction: Secure with Kerlix as directed. 32M Medipore Soft Cloth Surgical T 2x10 (in/yd) ape Discharge Instruction: Secure with tape as directed. Compression Wrap Compression Stockings Add-Ons foot cover Discharge Instruction: use foot covers Electronic Signature(s) Signed: 02/01/2023 3:22:53 PM By: [name] MD FACS Entered By: [name] on 02/01/2023 15:22:52 -------------------------------------------------------------------------------- Multi-Disciplinary Care Plan  Details Patient Name: Date of Service: [name], [name] 02/01/2023 2:00 PM Medical Record Number: [id number] Patient Account Number: [id number] Date of Birth/Sex: Treating RN: 15-Apr-1951 (72 y.o. Cline Cools Primary Care Bharat Antillon: Annita Brod Other Clinician: Referring Lawan Nanez: Treating Cristofher Livecchi/Extender: Satira Sark in Treatment: 60 Multidisciplinary Care Plan reviewed with physician Active Inactive Abuse / Safety / Falls / Self Care Management Nursing Diagnoses: History of Falls Impaired physical mobility Potential for falls Potential for injury related to falls Goals: Patient will remain injury free related to falls Date Initiated: 12/08/2021 Date Inactivated: 01/13/2022 Target Resolution Date: 01/05/2022 Goal Status: Met Patient/caregiver will verbalize understanding of skin care regimen Date Initiated: 12/08/2021 Target Resolution Date: 03/02/2023 Goal Status: Active Interventions: Assess fall risk on admission and as needed Assess: immobility, friction, shearing, incontinence upon admission and as needed Assess impairment of mobility on admission and  as needed per policy Provide education on fall prevention Notes: Wound/Skin Impairment Nursing Diagnoses: Impaired tissue integrity Knowledge deficit related to ulceration/compromised skin integrity Goals: [place], [name] ([id number]) [id number] Page 5 of 8 Patient/caregiver will verbalize understanding of skin care regimen Date Initiated: 12/08/2021 Target Resolution Date: 03/02/2023 Goal Status: Active Ulcer/skin breakdown will have a volume reduction of 30% by week 4 Date Initiated: 12/08/2021 Date Inactivated: 01/13/2022 Target Resolution Date: 01/05/2022 Goal Status: Met Interventions: Assess patient/caregiver ability to perform ulcer/skin care regimen upon admission and as needed Assess ulceration(s) every visit Provide education on ulcer and skin  care Treatment Activities: Skin care regimen initiated : 12/08/2021 Topical wound management initiated : 12/08/2021 Notes: Electronic Signature(s) Signed: 02/01/2023 4:25:24 PM By: [name] Pulling RN, BSN Entered By: [name] Pulling on 02/01/2023 14:33:42 -------------------------------------------------------------------------------- Pain Assessment Details Patient Name: Date of Service: [name], [name] 02/01/2023 2:00 PM Medical Record Number: [id number] Patient Account Number: [id number] Date of Birth/Sex: Treating RN: 1951/02/13 (72 y.o. Cline Cools Primary Care Kaidon Kinker: Annita Brod Other Clinician: Referring Tranae Laramie: Treating Silveria Botz/Extender: Satira Sark in Treatment: 60 Active Problems Location of Pain Severity and Description of Pain Patient Has Paino No Site Locations Pain Management and Medication Current Pain Management: Electronic Signature(s) Signed: 02/01/2023 4:25:24 PM By: [name] Pulling RN, BSN Entered By: [name] Pulling on 02/01/2023 14:20:39 Gwynn Burly ([id number]) [phone].pdf Page 6 of 8 -------------------------------------------------------------------------------- Patient/Caregiver Education Details Patient Name: Date of Service: Maren Reamer 7/2/2024andnbsp2:00 PM Medical Record Number: [id number] Patient Account Number: [id number] Date of Birth/Gender: Treating RN: 1951-05-21 (72 y.o. Cline Cools Primary Care Physician: Annita Brod Other Clinician: Referring Physician: Treating Physician/Extender: Satira Sark in Treatment: 67 Education Assessment Education Provided To: Patient Education Topics Provided Wound/Skin Impairment: Methods: Explain/Verbal Responses: State content correctly [employer]) Signed: 02/01/2023 4:25:24 PM By: [name] Pulling RN, BSN Entered By: [name] Pulling on 02/01/2023  14:33:57 -------------------------------------------------------------------------------- Wound Assessment Details Patient Name: Date of Service: [name], [name] 02/01/2023 2:00 PM Medical Record Number: [id number] Patient Account Number: [id number] Date of Birth/Sex: Treating RN: 10-28-1950 (72 y.o. Cline Cools Primary Care Tasmine Hipwell: Annita Brod Other Clinician: Referring Kathelene Rumberger: Treating Tris Howell/Extender: Satira Sark in Treatment: 60 Wound Status Wound Number: 1 Primary Etiology: Pressure Ulcer Wound Location: Right Calcaneus Wound Status: Open Wounding Event: Pressure Injury Comorbid History: Cataracts, Hypertension, Cirrhosis , Hepatitis C Date Acquired: 07/02/2020 Weeks Of Treatment: 60 Clustered Wound: No Photos Wound Measurements Length: (cm) 1.5 Width: (cm) 2 Depth: (cm) 1.4 Area: (cm) 2.3 Volume: (cm) 3.2 HENIGHAN JR, [place] ([id number]) % Reduction in Area: 90.5% % Reduction in Volume: 73.3% Epithelialization: Medium (34-66%) 56 Tunneling: No 99 Undermining: No [id number] Page 7 of 8 Wound Description Classification: Category/Stage IV Wound Margin: Well defined, not attached Exudate Amount: Medium Exudate Type: Serosanguineous Exudate Color: red, brown Foul Odor After Cleansing: No Slough/Fibrino Yes Wound Bed Granulation Amount: Medium (34-66%) Exposed Structure Granulation Quality: Pink Fascia Exposed: No Necrotic Amount: Medium (34-66%) Fat Layer (Subcutaneous Tissue) Exposed: Yes Necrotic Quality: Eschar, Adherent Slough Tendon Exposed: No Muscle Exposed: No Joint Exposed: No Bone Exposed: No Periwound Skin Texture Texture Color No Abnormalities Noted: No No Abnormalities Noted: Yes Callus: Yes Temperature / Pain Crepitus: No Temperature: No Abnormality Excoriation: No Induration: No Rash: No Scarring: No Moisture No Abnormalities Noted: Yes Treatment Notes Wound #1  (Calcaneus) Wound Laterality: Right Cleanser Soap and Water Discharge Instruction: May shower and wash  wound with dial antibacterial soap and water prior to dressing change. Wound Cleanser Discharge Instruction: Cleanse the wound with wound cleanser prior to applying a clean dressing using gauze sponges, not tissue or cotton balls. Peri-Wound Care Topical Primary Dressing Hydrofera Blue Ready Transfer Foam, 4x5 (in/in) Discharge Instruction: Apply to wound bed as instructed Secondary Dressing ALLEVYN Heel 4 1/2in x 5 1/2in / 10.5cm x 13.5cm Discharge Instruction: Apply over primary dressing as directed. Woven Gauze Sponge, Non-Sterile 4x4 in Discharge Instruction: Apply over primary dressing as directed. Secured With Elastic Bandage 4 inch (ACE bandage) Discharge Instruction: Secure with ACE bandage as directed. Kerlix Roll Sterile, 4.5x3.1 (in/yd) Discharge Instruction: Secure with Kerlix as directed. 28M Medipore Soft Cloth Surgical T 2x10 (in/yd) ape Discharge Instruction: Secure with tape as directed. Compression Wrap Compression Stockings Add-Ons foot cover Discharge Instruction: use foot covers Electronic Signature(s) Signed: 02/01/2023 4:25:24 PM By: [name] Pulling RN, BSN Entered By: [name] Pulling on 02/01/2023 14:30:33 Gwynn Burly ([id number]) [id number] Page 8 of 8 -------------------------------------------------------------------------------- Vitals Details Patient Name: Date of Service: [name], [place] RO N 02/01/2023 2:00 PM Medical Record Number: [id number] Patient Account Number: [id number] Date of Birth/Sex: Treating RN: [birth year] (72 y.o. Cline Cools Primary Care Sherrol Vicars: Annita Brod Other Clinician: Referring Kunaal Walkins: Treating Niquita Digioia/Extender: Satira Sark in Treatment: 60 Vital Signs Time Taken: 14:20 Temperature (F): 97.6 Height (in): 69 Pulse (bpm): 63 Weight (lbs):  165 Respiratory Rate (breaths/min): 18 Body Mass Index (BMI): 24.4 Blood Pressure (mmHg): 166/74 Reference Range: 80 - 120 mg / dl Electronic Signature(s) Signed: 02/01/2023 4:25:24 PM By: [name] Pulling RN, BSN Entered By: [name] Pulling on 02/01/2023 14:20:29

## 2023-02-01 NOTE — Progress Notes (Signed)
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 slough and eschar. 08/31/2022: Epithelium is encroaching over the surface of the wound. There is some callus and eschar accumulation here. The undermining is 2.2 cm with good granulation tissue visible. No concern for infection. 09/14/2022: No real change since his last visit. 09/28/2022: The undermined portion has contracted somewhat. There is epithelium coming in across the posterior aspect of the heel wound. He did not get his vascular studies for reasons that are unclear. It sounds like perhaps his sister did not understand the importance of these 10/12/2022: The undermined portion has contracted a bit more. There is some callus overlying very thin and tenuous epithelium on the posterior aspect of the heel wound. There is slough accumulation. His vascular studies were performed. His TBIs are normal; ABIs were noncompressible. 11/02/2022: His wound has continued to contract. There is some slough and senescent skin  accumulation around the margins of the wound. The undermined portion has come in a bit further. 11/16/2022: His wound is smaller and the undermined portion has contracted further. There is some callus around the perimeter as well as some slough on the remaining exposed surface. Quite a bit of the heel has epithelialized. 12/14/2022: The patient has been unable to come to clinic for about a month secondary to both illness and transportation difficulties. The wound measured a little deeper on the straight in aspect, but this actually looks as though this is because more healthy tissue has filled in around the wound opening. There is still some undermining present with slough and callus. 12/20/2022: The wound is smaller and the undermining has decreased even further. There is just a little bit of slough present and eschar at the posterior edge. 01/18/2023: The undermining is almost completely obliterated. There is some slough and eschar accumulation. No concern for infection. 02/01/2023: The undermined area has not really contracted much but he has more epithelialization around the perimeter. There is a little bit of macerated skin present along with some slough. Patient History Information obtained from Patient. Family History Cancer - Mother,Father, Diabetes - Siblings, Heart Disease - Maternal Grandparents, Hypertension - Mother, Lung Disease - Mother, Stroke - Mother, Thyroid Problems - Siblings, No family history of Hereditary Spherocytosis, Kidney Disease, Seizures, Tuberculosis. 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 Medical History Eyes Patient has history of Cataracts Cardiovascular Patient has history of Hypertension Gastrointestinal Patient has history of Cirrhosis , Hepatitis C - says it is gone Hospitalization/Surgery History - inguinal hernia repair. -  knee surgery x4. Medical A Surgical History Notes nd Hematologic/Lymphatic hyperlipidemia Gastrointestinal colon polyps Genitourinary BPH Musculoskeletal arthritis Neurologic stroke Objective Constitutional Hypertensive, asymptomatic. no acute distress. Vitals Time Taken: 2:20 PM, Height:  69 in, Weight: 165 lbs, BMI: 24.4, Temperature: 97.6 F, Pulse: 63 bpm, Respiratory Rate: 18 breaths/min, Blood Pressure: 166/74 mmHg. Watts, [name] ([id number]) [id number] Page 8 of 11 Respiratory Normal work of breathing on room air. General Notes: 02/01/2023: The undermined area has not really contracted much but he has more epithelialization around the perimeter. There is a little bit of macerated skin present along with some slough. Integumentary (Hair, Skin) Wound #1 status is Open. Original cause of wound was Pressure Injury. The date acquired was: 07/02/2020. The wound has been in treatment 60 weeks. The wound is located on the Right Calcaneus. The wound measures 1.5cm length x 2cm width x 1.4cm depth; 2.356cm^2 area and 3.299cm^3 volume. There is Fat Layer (Subcutaneous Tissue) exposed. There is no tunneling or undermining noted. There is a medium amount of serosanguineous drainage noted. The wound margin is well defined and not attached to the wound base. There is medium (34-66%) pink granulation within the wound bed. There is a medium (34-66%) amount of necrotic tissue within the wound bed including Eschar and Adherent Slough. The periwound skin appearance had no abnormalities noted for moisture. The periwound skin appearance had no abnormalities noted for color. The periwound skin appearance exhibited: Callus. The periwound skin appearance did not exhibit: Crepitus, Excoriation, Induration, Rash, Scarring. Periwound temperature was noted as No Abnormality. Assessment Active Problems ICD-10 Pressure ulcer of right heel, unstageable Personal history of  transient ischemic attack (TIA), and cerebral infarction without residual deficits Unspecified cirrhosis of liver Chronic viral hepatitis C Cerebral infarction, unspecified Essential (primary) hypertension Procedures Wound #1 Pre-procedure diagnosis of Wound #1 is a Pressure Ulcer located on the Right Calcaneus . There was a Selective/Open Wound Skin/Epidermis Debridement with a total area of 2.36 sq cm performed by [name], MD. With the following instrument(s): Curette to remove Non-Viable tissue/material. Material removed includes Slough, Skin: Dermis, and Skin: Epidermis after achieving pain control using Lidocaine 4% T opical Solution. No specimens were taken. A time out was conducted at 14:40, prior to the start of the procedure. A Minimum amount of bleeding was controlled with Pressure. The procedure was tolerated well. Post Debridement Measurements: 1.5cm length x 2cm width x 1.4cm depth; 3.299cm^3 volume. Post debridement Stage noted as Category/Stage IV. Character of Wound/Ulcer Post Debridement is improved. Post procedure Diagnosis Wound #1: Same as Pre-Procedure General Notes: Scribed for Dr Lady Gary by [name] Pulling, RN. Plan Follow-up Appointments: Return Appointment in 2 weeks. - Dr. Lady Gary Room 2 tuesday 02/15/23 at 2:00pm Anesthetic: Wound #1 Right Calcaneus: (In clinic) Topical Lidocaine 4% applied to wound bed Bathing/ Shower/ Hygiene: May shower and wash wound with soap and water. - with dressing changes Off-Loading: Other: - heel offloading sandal, avoid pressure to back of the heel with pillows under calves to float heels Home Health: No change in wound care orders this week; continue Home Health for wound care. May utilize formulary equivalent dressing for wound treatment orders unless otherwise specified. - slide hydrofera blue into undermined area Dressing changes to be completed by Home Health on Monday / Wednesday / Friday except when patient has scheduled  visit [facility]. Other Home Health Orders/Instructions: - [facility] 3x a week The following medication(s) was prescribed: lidocaine topical 4 % cream cream topical once daily was prescribed at facility WOUND #1: - Calcaneus Wound Laterality: Right Cleanser: Soap and Water 3 x Per Week/30 Days Discharge Instructions: May shower and wash wound with dial antibacterial soap and water prior to dressing change. Cleanser:  Wound Cleanser 3 x Per Week/30 Days Discharge Instructions: Cleanse the wound with wound cleanser prior to applying a clean dressing using gauze sponges, not tissue or cotton balls. Prim Dressing: Hydrofera Blue Ready Transfer Foam, 4x5 (in/in) 3 x Per Week/30 Days ary Discharge Instructions: Apply to wound bed as instructed Secondary Dressing: ALLEVYN Heel 4 1/2in x 5 1/2in / 10.5cm x 13.5cm (Generic) 3 x Per Week/30 Days Discharge Instructions: Apply over primary dressing as directed. Secondary Dressing: Woven Gauze Sponge, Non-Sterile 4x4 in 3 x Per Week/30 Days Discharge Instructions: Apply over primary dressing as directed. Secured With: Elastic Bandage 4 inch (ACE bandage) (Generic) 3 x Per Week/30 Days Discharge Instructions: Secure with ACE bandage as directed. Secured With: [employer], 4.5x3.1 (in/yd) (Generic) 3 x Per Week/30 Days Discharge Instructions: Secure with Kerlix as directed. Secured With: 59M Med[employer] Scientist, research (life sciences) Surgical T 2x10 (in/yd) (Generic) 3 x Per Week/30 Days ape TADHG, SUNTKEN ([id number]) [id number] Page 9 of 11 Discharge Instructions: Secure with tape as directed. Add-Ons: foot cover (Generic) 3 x Per Week/30 Days Discharge Instructions: use foot covers 02/01/2023: The undermined area has not really contracted much but he has more epithelialization around the perimeter. There is a little bit of macerated skin present along with some slough. I used a curette to debride slough, callus, and some of  the macerated skin from his wound. We will continue to use [facility], making sure to pack it into the undermined portion of the wound. Continue to wear the heel offloading shoe. Follow-up in 2 weeks. Electronic Signature(s) Signed: 02/01/2023 3:28:56 PM By: [name] MD FACS Entered By: [name] on 02/01/2023 15:28:56 -------------------------------------------------------------------------------- HxROS Details Patient Name: Date of Service: [name], [name] 02/01/2023 2:00 PM Medical Record Number: [id number] Patient Account Number: [id number] Date of Birth/Sex: Treating RN: 29-Mar-1951 (72 y.o. M) Primary Care Provider: Annita Brod Other Clinician: Referring Provider: Treating Provider/Extender: Satira Sark in Treatment: 60 Information Obtained From Patient Eyes Medical History: Positive for: Cataracts Hematologic/Lymphatic Medical History: Past Medical History Notes: hyperlipidemia Cardiovascular Medical History: Positive for: Hypertension Gastrointestinal Medical History: Positive for: Cirrhosis ; Hepatitis C - says it is gone Past Medical History Notes: colon polyps Genitourinary Medical History: Past Medical History Notes: BPH Musculoskeletal Medical History: Past Medical History Notes: arthritis Neurologic Medical History: Past Medical History Notes: stroke ARNES GRANUM, [name] ([id number]) [id number] Page 10 of 11 HBO Extended History Items Eyes: Cataracts Immunizations Pneumococcal Vaccine: Received Pneumococcal Vaccination: Yes Received Pneumococcal Vaccination On or After 60th Birthday: Yes Implantable Devices None Hospitalization / Surgery History Type of Hospitalization/Surgery inguinal hernia repair knee surgery x4 Family and Social History Cancer: Yes - Mother,Father; Diabetes: Yes - Siblings; Heart Disease: Yes - Maternal Grandparents; Hereditary Spherocytosis: No;  Hypertension: Yes - Mother; Kidney Disease: No; Lung Disease: Yes - Mother; Seizures: No; Stroke: Yes - Mother; Thyroid Problems: Yes - Siblings; Tuberculosis: No; Former smoker - ended on 09/25/1982; Marital Status - Single; Alcohol Use: Never; Drug Use: Prior History - cocaine, heroin, marijuana; Caffeine Use: Never; Financial Concerns: No; Food, Clothing or Shelter Needs: No; Support System Lacking: Yes; Transportation Concerns: No Electronic Signature(s) Signed: 02/01/2023 4:21:51 PM By: [name] MD FACS Entered By: [name] on 02/01/2023 15:23:54 -------------------------------------------------------------------------------- SuperBill Details Patient Name: Date of Service: [name], [name] 02/01/2023 Medical Record Number: [id number] Patient Account Number: [id number] Date of Birth/Sex: Treating RN: [birth year] (72 y.o. M) Primary Care Provider: Annita Brod Other Clinician: Referring  Provider: Treating Provider/Extender: Satira Sark in Treatment: 60 Diagnosis Coding ICD-10 Codes Code Description L89.610 Pressure ulcer of right heel, unstageable Z86.73 Personal history of transient ischemic attack (TIA), and cerebral infarction without residual deficits K74.60 Unspecified cirrhosis of liver B18.2 Chronic viral hepatitis C I63.9 Cerebral infarction, unspecified I10 Essential (primary) hypertension Facility Procedures : CPT4 Code: [id number] Description: 97597 - DEBRIDE WOUND 1ST 20 SQ CM OR < ICD-10 Diagnosis Description L89.610 Pressure ulcer of right heel, unstageable Modifier: Quantity: 1 Physician Procedures : CPT4 Code Description Modifier [id number] - WC PHYS LEVEL 4 - EST PT 25 ICD-10 Diagnosis Description L89.610 Pressure ulcer of right heel, unstageable Z86.73 Personal history of transient ischemic attack (TIA), and cerebral infarction without  residual deficits JEOFFREY KONE, [name] ([id number])  [phone].pdf Page 11 o K74.60 Unspecified cirrhosis of liver B18.2 Chronic viral hepatitis C Quantity: 1 f 11 : [id number] - WC PHYS DEBR WO ANESTH 20 SQ CM 1 ICD-10 Diagnosis Description L89.610 Pressure ulcer of right heel, unstageable Quantity: Electronic Signature(s) Signed: 02/01/2023 3:29:19 PM By: [name] MD FACS Entered By: [name] on 02/01/2023 15:29:18

## 2023-02-15 ENCOUNTER — Ambulatory Visit (HOSPITAL_BASED_OUTPATIENT_CLINIC_OR_DEPARTMENT_OTHER): Payer: 59 | Admitting: General Surgery

## 2023-02-15 DIAGNOSIS — L89610 Pressure ulcer of right heel, unstageable: Secondary | ICD-10-CM | POA: Diagnosis not present

## 2023-02-15 NOTE — Progress Notes (Signed)
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 There is good granulation tissue on the surface. He has accumulated some slough and eschar. 08/31/2022: Epithelium is encroaching over the surface of the wound. There is some callus and eschar accumulation here. The undermining is 2.2 cm with good granulation tissue visible. No concern for infection. 09/14/2022: No real change since his last visit. 09/28/2022: The undermined portion has contracted somewhat. There is epithelium coming in across the posterior aspect of the heel wound. He did not get his vascular studies for reasons that are unclear. It sounds like perhaps his sister did not understand the importance of these 10/12/2022: The undermined portion has contracted a bit more. There is some callus overlying very thin and tenuous epithelium on the posterior aspect of the heel wound. There is slough accumulation. His vascular studies were performed. His TBIs are normal; ABIs were noncompressible. 11/02/2022: His  wound has continued to contract. There is some slough and senescent skin accumulation around the margins of the wound. The undermined portion has come in a bit further. 11/16/2022: His wound is smaller and the undermined portion has contracted further. There is some callus around the perimeter as well as some slough on the remaining exposed surface. Quite a bit of the heel has epithelialized. 12/14/2022: The patient has been unable to come to clinic for about a month secondary to both illness and transportation difficulties. The wound measured a little deeper on the straight in aspect, but this actually looks as though this is because more healthy tissue has filled in around the wound opening. There is still some undermining present with slough and callus. 12/20/2022: The wound is smaller and the undermining has decreased even further. There is just a little bit of slough present and eschar at the posterior edge. 01/18/2023: The undermining is almost completely obliterated. There is some slough and eschar accumulation. No concern for infection. 02/01/2023: The undermined area has not really contracted much but he has more epithelialization around the perimeter. There is a little bit of macerated skin present along with some slough. 02/15/2023: The undermined area has contracted a bit more. There is some slough buildup underneath the skin on the part of the wound that is undermined. There is some callus accumulation around the wound edges. No concern for infection. Patient History Information obtained from Patient. Family History Cancer - Mother,Father, Diabetes - Siblings, Heart Disease - Maternal Grandparents, Hypertension - Mother, Lung Disease - Mother, Stroke - Mother, Thyroid Problems - Siblings, No family history of Hereditary Spherocytosis, Kidney Disease, Seizures, Tuberculosis. 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 Medical History Eyes Patient has  history of Cataracts Cardiovascular Patient has history of Hypertension Gastrointestinal Patient has history of Cirrhosis , Hepatitis C - says it is gone Hospitalization/Surgery History - inguinal hernia repair. - knee surgery x4. Medical A Surgical History Notes nd Hematologic/Lymphatic hyperlipidemia Gastrointestinal colon polyps Genitourinary BPH Musculoskeletal arthritis Neurologic stroke Objective Constitutional Slightly hypertensive. no acute distress. Vitals Time Taken: 1:52 AM, Height: 69 in, Weight: 165 lbs, BMI: 24.4, Temperature: 98.4 F, Pulse: 67 bpm, Respiratory Rate: 18 breaths/min, Blood Pressure: 148/76 mmHg. Respiratory Normal work of breathing on room air. [name], [name] ([id number]) [id number] Page 8 of 11 General Notes: 02/15/2023: The undermined area has contracted a bit more. There is some slough buildup underneath the skin on the part of the wound that is undermined. There is some callus accumulation around the wound edges. No concern for infection. Integumentary (Hair, Skin) Wound #1 status is Open. Original cause of wound was Pressure Injury. The date acquired was: 07/02/2020. The wound has been in treatment 62 weeks. The wound is located on the Right Calcaneus. The wound measures 1.5cm length x 1.5cm width x 1cm depth; 1.767cm^2 area and 1.767cm^3 volume. There is Fat Layer (Subcutaneous Tissue) exposed. There is no tunneling or undermining noted. There is a medium amount of serosanguineous drainage noted. The wound margin is well defined and not attached to the wound base. There is medium (34-66%) pink granulation within the wound bed. There is a medium (34-66%) amount of necrotic tissue within the wound bed including Eschar and Adherent Slough. The periwound skin appearance had no abnormalities noted for moisture. The periwound  skin appearance had no abnormalities noted for color. The periwound skin appearance exhibited: Callus. The periwound skin appearance did not exhibit: Crepitus, Excoriation, Induration, Rash, Scarring. Periwound temperature was noted as No Abnormality. 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 There was a Selective/Open Wound Non-Viable Tissue Debridement with a total area of 1.77 sq cm performed by [name], MD. With the following instrument(s): Curette to remove Non-Viable tissue/material. Material removed includes Callus and Slough and after achieving pain control using Lidocaine 5% topical ointment. No specimens were taken. A time out was conducted at 14:14, prior to the start of the procedure. A Minimum amount of bleeding was controlled with Pressure. The procedure was tolerated well with a pain level of 0 throughout and a pain level of 0 following the procedure. Post Debridement Measurements: 1.5cm length x 1.5cm width x 1cm depth; 1.767cm^3 volume. Post debridement Stage noted as Category/Stage IV. Character of Wound/Ulcer Post Debridement is improved. Post procedure Diagnosis Wound #1: Same as Pre-Procedure General Notes: Scribed for Dr Lady Gary by J.Scotton. Plan Follow-up Appointments: Return Appointment in 2 weeks. - Dr. Lady Gary Room 2 Anesthetic: Wound #1 Right Calcaneus: (In clinic) Topical Lidocaine 4% applied to wound bed Bathing/ Shower/ Hygiene: May shower and wash wound with soap and water. - with dressing changes Off-Loading: Other: - heel offloading sandal, avoid pressure to back of the heel with  pillows under calves to float heels Home Health: No change in wound care orders this week; continue Home Health for wound care. May utilize formulary equivalent dressing for wound treatment orders unless otherwise specified. - slide Hydrofera Blue Ready into undermined area Dressing changes to be  completed by Home Health on Monday / Wednesday / Friday except when patient has scheduled visit at [facility]. Other Home Health Orders/Instructions: - [facility] 3x a week WOUND #1: - Calcaneus Wound Laterality: Right Cleanser: Soap and Water 3 x Per Week/30 Days Discharge Instructions: May shower and wash wound with dial antibacterial soap and water prior to dressing change. Cleanser: Wound Cleanser 3 x Per Week/30 Days Discharge Instructions: Cleanse the wound with wound cleanser prior to applying a clean dressing using gauze sponges, not tissue or cotton balls. Prim Dressing: Hydrofera Blue Ready Transfer Foam, 4x5 (in/in) 3 x Per Week/30 Days ary Discharge Instructions: Apply to wound bed as instructed Secondary Dressing: ALLEVYN Heel 4 1/2in x 5 1/2in / 10.5cm x 13.5cm (Generic) 3 x Per Week/30 Days Discharge Instructions: Apply over primary dressing as directed. Secondary Dressing: Woven Gauze Sponge, Non-Sterile 4x4 in 3 x Per Week/30 Days Discharge Instructions: Apply over primary dressing as directed. Secured With: Elastic Bandage 4 inch (ACE bandage) (Generic) 3 x Per Week/30 Days Discharge Instructions: Secure with ACE bandage as directed. Secured With: [employer], 4.5x3.1 (in/yd) (Generic) 3 x Per Week/30 Days Discharge Instructions: Secure with Kerlix as directed. Secured With: 40M Medipore Scientist, research (life sciences) Surgical T 2x10 (in/yd) (Generic) 3 x Per Week/30 Days ape Discharge Instructions: Secure with tape as directed. Add-Ons: foot cover (Generic) 3 x Per Week/30 Days Discharge Instructions: use foot covers [name], [name] ([id number])  [id number] Page 9 of 11 02/15/2023: The undermined area has contracted a bit more. There is some slough buildup underneath the skin on the part of the wound that is undermined. There is some callus accumulation around the wound edges. No concern for infection. I used a curette to debride callus and slough from the wound. We will continue to pack the undermined portion of this wound with Hydrofera Blue and apply it to the exposed wound surface as well. He will follow-up in 2 weeks. Electronic Signature(s) Signed: 02/15/2023 2:36:46 PM By: [name] MD FACS Entered By: [name] on 02/15/2023 14:36:46 -------------------------------------------------------------------------------- HxROS Details Patient Name: Date of Service: [name], [name] 02/15/2023 1:45 PM Medical Record Number: [id number] Patient Account Number: [id number] Date of Birth/Sex: Treating RN: [birth year] (72 y.o. M) Primary Care Provider: Annita Brod Other Clinician: Referring Provider: Treating Provider/Extender: Satira Sark in Treatment: 65 Information Obtained From Patient Eyes Medical History: Positive for: Cataracts Hematologic/Lymphatic Medical History: Past Medical History Notes: hyperlipidemia Cardiovascular Medical History: Positive for: Hypertension Gastrointestinal Medical History: Positive for: Cirrhosis ; Hepatitis C - says it is gone Past Medical History Notes: colon polyps Genitourinary Medical History: Past Medical History Notes: BPH Musculoskeletal Medical History: Past Medical History Notes: arthritis Neurologic Medical History: Past Medical History Notes: stroke HBO Extended History Items Eyes: Cataracts [name], [name] ([id number]) [id number] Page 10 of 11 Immunizations Pneumococcal Vaccine: Received Pneumococcal Vaccination: Yes Received Pneumococcal Vaccination On or  After 60th Birthday: Yes Implantable Devices None Hospitalization / Surgery History Type of Hospitalization/Surgery inguinal hernia repair knee surgery x4 Family and Social History Cancer: Yes - Mother,Father; Diabetes: Yes - Siblings; Heart Disease: Yes - Maternal Grandparents; Hereditary Spherocytosis: No; Hypertension: Yes - Mother; Kidney Disease: No; Lung Disease: Yes - Mother; Seizures: No; Stroke: Yes - Mother; Thyroid Problems: Yes - Siblings; Tuberculosis: No; Former smoker - ended on 09/25/1982; Marital Status - Single; Alcohol Use: Never; Drug Use: Prior History - cocaine, heroin, marijuana; Caffeine Use: Never; Financial Concerns: No; Food, Clothing or Shelter Needs:  No; Support System Lacking: Yes; Transportation Concerns: No Electronic Signature(s) Signed: 02/15/2023 3:28:51 PM By: [name] MD FACS Entered By: [name] on 02/15/2023 14:35:26 -------------------------------------------------------------------------------- SuperBill Details Patient Name: Date of Service: [name], [name] 02/15/2023 Medical Record Number: [id number] Patient Account Number: [id number] Date of Birth/Sex: Treating RN: [birth year] (72 y.o. M) Primary Care Provider: Annita Brod Other Clinician: Referring Provider: Treating Provider/Extender: Satira Sark in Treatment: 60 Diagnosis Coding ICD-10 Codes Code Description L89.610 Pressure ulcer of right heel, unstageable Z86.73 Personal history of transient ischemic attack (TIA), and cerebral infarction without residual deficits K74.60 Unspecified cirrhosis of liver B18.2 Chronic viral hepatitis C I63.9 Cerebral infarction, unspecified I10 Essential (primary) hypertension Facility Procedures : CPT4 Code: [id number] Description: 97597 - DEBRIDE WOUND 1ST 20 SQ CM OR < ICD-10 Diagnosis Description L89.610 Pressure ulcer of right heel, unstageable Modifier: Quantity: 1 Physician Procedures : CPT4  Code Description Modifier [id number] 99214 - WC PHYS LEVEL 4 - EST PT 25 ICD-10 Diagnosis Description L89.610 Pressure ulcer of right heel, unstageable Z86.73 Personal history of transient ischemic attack (TIA), and cerebral infarction without  residual deficits B18.2 Chronic viral hepatitis C I63.9 Cerebral infarction, unspecified Quantity: 1 : [id number] - WC PHYS DEBR WO ANESTH 20 SQ CM ICD-10 Diagnosis Description L89.610 Pressure ulcer of right heel, EDIBERTO SENS, Alben ([id number]) [phone].pdf Page 11 o Quantity: 1 f 11 Electronic Signature(s) Signed: 02/15/2023 2:37:08 PM By: [name] MD FACS Entered By: [name] on 02/15/2023 14:37:07

## 2023-02-15 NOTE — Progress Notes (Signed)
[name] ([id number]) [id number] Page 1 of 7 Visit Report for 02/15/2023 Arrival Information Details Patient Name: Date of Service: [name] 02/15/2023 1:45 PM Medical Record Number: [id number] Patient Account Number: [id number] Date of Birth/Sex: Treating RN: 04-09-1951 (72 y.o. M) Primary Care Fredericka Bottcher: Annita Brod Other Clinician: Referring Joren Rehm: Treating Ethel Meisenheimer/Extender: Satira Sark in Treatment: 65 Visit Information History Since Last Visit All ordered tests and consults were completed: No Patient Arrived: Wheel Chair Added or deleted any medications: No Arrival Time: 13:51 Any new allergies or adverse reactions: No Accompanied By: sister Had a fall or experienced change in No Transfer Assistance: None activities of daily living that may affect Patient Identification Verified: Yes risk of falls: Secondary Verification Process Completed: Yes Signs or symptoms of abuse/neglect since last visito No Patient Requires Transmission-Based Precautions: No Hospitalized since last visit: No Patient Has Alerts: Yes Implantable device outside of the clinic excluding No Patient Alerts: Patient on Blood Thinner cellular tissue based products placed in the center since last visit: Pain Present Now: No Electronic Signature(s) Signed: 02/15/2023 4:26:02 PM By: Dayton Scrape Entered By: Dayton Scrape on 02/15/2023 13:51:56 -------------------------------------------------------------------------------- Encounter Discharge Information Details Patient Name: Date of Service: [name], [place] [name] 02/15/2023 1:45 PM Medical Record Number: [id number] Patient Account Number: [id number] Date of Birth/Sex: Treating RN: 03/14/1951 (72 y.o. [name] Primary Care Solveig Fangman: Annita Brod Other Clinician: Referring Kaushal Vannice: Treating Akiya Morr/Extender: Satira Sark in Treatment:  18 Encounter Discharge Information Items Post Procedure Vitals Discharge Condition: Stable Temperature (F): 98.4 Ambulatory Status: Wheelchair Pulse (bpm): 67 Discharge Destination: Home Respiratory Rate (breaths/min): 18 Transportation: Private Auto Blood Pressure (mmHg): 148/76 Accompanied By: sister Schedule Follow-up Appointment: Yes Clinical Summary of Care: Patient Declined Electronic Signature(s) Signed: 02/15/2023 4:25:31 PM By: Karie Schwalbe RN Entered By: Karie Schwalbe on 02/15/2023 16:19:02 Gwynn Burly ([id number]) [id number] Page 2 of 7 -------------------------------------------------------------------------------- Lower Extremity Assessment Details Patient Name: Date of Service: [name] 02/15/2023 1:45 PM Medical Record Number: [id number] Patient Account Number: [id number] Date of Birth/Sex: Treating RN: 10/18/1950 (72 y.o. [name] Primary Care Javious Hallisey: Annita Brod Other Clinician: Referring Desteni Piscopo: Treating Zhion Pevehouse/Extender: Satira Sark in Treatment: 62 Edema Assessment Assessed: Kyra Searles: No] [name]: No] Edema: [Left: N] [Right: o] Calf Left: Right: Point of Measurement: From Medial Instep 35 cm Ankle Left: Right: Point of Measurement: From Medial Instep 24 cm Vascular Assessment Pulses: Dorsalis Pedis Palpable: [Right:Yes] Electronic Signature(s) Signed: 02/15/2023 4:25:31 PM By: Karie Schwalbe RN Entered By: Karie Schwalbe on 02/15/2023 14:12:41 -------------------------------------------------------------------------------- Multi Wound Chart Details Patient Name: Date of Service: [name], [name] 02/15/2023 1:45 PM Medical Record Number: [id number] Patient Account Number: [id number] Date of Birth/Sex: Treating RN: 07-16-1951 (72 y.o. M) Primary Care Raimi Guillermo: Annita Brod Other Clinician: Referring [place] Dalporto: Treating Rhianon Zabawa/Extender: Satira Sark in Treatment: 62 Vital Signs Height(in): 69 Pulse(bpm): 67 Weight(lbs): 165 Blood Pressure(mmHg): 148/76 Body Mass Index(BMI): 24.4 Temperature(F): 98.4 Respiratory Rate(breaths/min): 18 [1:Photos:] [N/A:N/A] Right Calcaneus N/A N/A Wound Location: Pressure Injury N/A N/A Wounding Event: Pressure Ulcer N/A N/A Primary Etiology: Cataracts, Hypertension, Cirrhosis , N/A N/A Comorbid History: Hepatitis C 07/02/2020 N/A N/A Date Acquired: 68 N/A N/A Weeks of Treatment: Open N/A N/A Wound Status: No N/A N/A Wound Recurrence: 1.5x1.5x1 N/A N/A Measurements L x W x D (cm) 1.767 N/A N/A A (cm) : rea 1.767 N/A N/A Volume (cm) :  92.90% N/A N/A % Reduction in A rea: 85.70% N/A N/A % Reduction in Volume: Category/Stage IV N/A N/A Classification: Medium N/A N/A Exudate A mount: Serosanguineous N/A N/A Exudate Type: red, brown N/A N/A Exudate Color: Well defined, not attached N/A N/A Wound Margin: Medium (34-66%) N/A N/A Granulation A mount: Pink N/A N/A Granulation Quality: Medium (34-66%) N/A N/A Necrotic A mount: Eschar, Adherent Slough N/A N/A Necrotic Tissue: Fat Layer (Subcutaneous Tissue): Yes N/A N/A Exposed Structures: Fascia: No Tendon: No Muscle: No Joint: No Bone: No Medium (34-66%) N/A N/A Epithelialization: Debridement - Selective/Open Wound N/A N/A Debridement: Pre-procedure Verification/Time Out 14:14 N/A N/A Taken: Lidocaine 5% topical ointment N/A N/A Pain Control: Callus, Slough N/A N/A Tissue Debrided: Non-Viable Tissue N/A N/A Level: 1.77 N/A N/A Debridement A (sq cm): rea Curette N/A N/A Instrument: Minimum N/A N/A Bleeding: Pressure N/A N/A Hemostasis A chieved: 0 N/A N/A Procedural Pain: 0 N/A N/A Post Procedural Pain: Procedure was tolerated well N/A N/A Debridement Treatment Response: 1.5x1.5x1 N/A N/A Post Debridement Measurements L x W x D (cm) 1.767 N/A N/A Post Debridement  Volume: (cm) Category/Stage IV N/A N/A Post Debridement Stage: Callus: Yes N/A N/A Periwound Skin Texture: Excoriation: No Induration: No Crepitus: No Rash: No Scarring: No Maceration: No N/A N/A Periwound Skin Moisture: Dry/Scaly: No Atrophie Blanche: No N/A N/A Periwound Skin Color: Cyanosis: No Ecchymosis: No Erythema: No Hemosiderin Staining: No Mottled: No Pallor: No Rubor: No No Abnormality N/A N/A Temperature: Debridement N/A N/A Procedures Performed: Treatment Notes Electronic Signature(s) Signed: 02/15/2023 2:33:44 PM By: [name] MD FACS Entered By: [name] on 02/15/2023 14:33:44 Multi-Disciplinary Care Plan Details -------------------------------------------------------------------------------- Gwynn Burly ([id number]) [id number] Page 4 of 7 Patient Name: Date of Service: [name] 02/15/2023 1:45 PM Medical Record Number: [id number] Patient Account Number: [id number] Date of Birth/Sex: Treating RN: 16-Dec-1950 (72 y.o. [name] Primary Care Logann Whitebread: Annita Brod Other Clinician: Referring Issabelle Mcraney: Treating Kayliegh Boyers/Extender: Satira Sark in Treatment: 17 Multidisciplinary Care Plan reviewed with physician Active Inactive Abuse / Safety / Falls / Self Care Management Nursing Diagnoses: History of Falls Impaired physical mobility Potential for falls Potential for injury related to falls Goals: Patient will remain injury free related to falls Date Initiated: 12/08/2021 Date Inactivated: 01/13/2022 Target Resolution Date: 01/05/2022 Goal Status: Met Patient/caregiver will verbalize understanding of skin care regimen Date Initiated: 12/08/2021 Target Resolution Date: 04/02/2023 Goal Status: Active Interventions: Assess fall risk on admission and as needed Assess: immobility, friction, shearing, incontinence upon admission and as needed Assess impairment of  mobility on admission and as needed per policy Provide education on fall prevention Notes: Wound/Skin Impairment Nursing Diagnoses: Impaired tissue integrity Knowledge deficit related to ulceration/compromised skin integrity Goals: Patient/caregiver will verbalize understanding of skin care regimen Date Initiated: 12/08/2021 Target Resolution Date: 04/02/2023 Goal Status: Active Ulcer/skin breakdown will have a volume reduction of 30% by week 4 Date Initiated: 12/08/2021 Date Inactivated: 01/13/2022 Target Resolution Date: 01/05/2022 Goal Status: Met Interventions: Assess patient/caregiver ability to perform ulcer/skin care regimen upon admission and as needed Assess ulceration(s) every visit Provide education on ulcer and skin care Treatment Activities: Skin care regimen initiated : 12/08/2021 Topical wound management initiated : 12/08/2021 Notes: Electronic Signature(s) Signed: 02/15/2023 4:25:31 PM By: Karie Schwalbe RN Entered By: Karie Schwalbe on 02/15/2023 16:17:25 Pain Assessment Details -------------------------------------------------------------------------------- Gwynn Burly ([id number]) [id number] Page 5 of 7 Patient Name: Date of Service: [name], [place] 02/15/2023 1:45 PM Medical Record Number:  [id number] Patient Account Number: [id number] Date of Birth/Sex: Treating RN: [birth year] (72 y.o. M) Primary Care Daiki Dicostanzo: Annita Brod Other Clinician: Referring Ewen Varnell: Treating Sai Moura/Extender: Satira Sark in Treatment: 54 Active Problems Location of Pain Severity and Description of Pain Patient Has Paino No Site Locations Pain Management and Medication Current Pain Management: Electronic Signature(s) Signed: 02/15/2023 4:26:02 PM By: Dayton Scrape Entered By: Dayton Scrape on 02/15/2023 13:52:29 -------------------------------------------------------------------------------- Patient/Caregiver Education  Details Patient Name: Date of Service: [name] RO N 7/16/2024andnbsp1:45 PM Medical Record Number: [id number] Patient Account Number: [id number] Date of Birth/Gender: Treating RN: 11-03-1950 (72 y.o. [name] Primary Care Physician: Annita Brod Other Clinician: Referring Physician: Treating Physician/Extender: Satira Sark in Treatment: 69 Education Assessment Education Provided To: Patient Education Topics Provided Wound/Skin Impairment: Methods: Explain/Verbal Responses: Return demonstration correctly Electronic Signature(s) Signed: 02/15/2023 4:25:31 PM By: Karie Schwalbe RN Entered By: Karie Schwalbe on 02/15/2023 16:17:39 Gwynn Burly ([id number]) [id number] Page 6 of 7 -------------------------------------------------------------------------------- Wound Assessment Details Patient Name: Date of Service: [name] 02/15/2023 1:45 PM Medical Record Number: [id number] Patient Account Number: [id number] Date of Birth/Sex: Treating RN: [birth year] (72 y.o. M) Primary Care Horris Speros: Annita Brod Other Clinician: Referring Angeletta Goelz: Treating Jasslyn Finkel/Extender: Satira Sark in Treatment: 62 Wound Status Wound Number: 1 Primary Etiology: Pressure Ulcer Wound Location: Right Calcaneus Wound Status: Open Wounding Event: Pressure Injury Comorbid History: Cataracts, Hypertension, Cirrhosis , Hepatitis C Date Acquired: 07/02/2020 Weeks Of Treatment: 62 Clustered Wound: No Photos Wound Measurements Length: (cm) 1.5 Width: (cm) 1.5 Depth: (cm) 1 Area: (cm) 1. Volume: (cm) 1. % Reduction in Area: 92.9% % Reduction in Volume: 85.7% Epithelialization: Medium (34-66%) 767 Tunneling: No 767 Undermining: No Wound Description Classification: Category/Stage IV Wound Margin: Well defined, not attached Exudate Amount: Medium Exudate Type:  Serosanguineous Exudate Color: red, brown Foul Odor After Cleansing: No Slough/Fibrino Yes Wound Bed Granulation Amount: Medium (34-66%) Exposed Structure Granulation Quality: Pink Fascia Exposed: No Necrotic Amount: Medium (34-66%) Fat Layer (Subcutaneous Tissue) Exposed: Yes Necrotic Quality: Eschar, Adherent Slough Tendon Exposed: No Muscle Exposed: No Joint Exposed: No Bone Exposed: No Periwound Skin Texture Texture Color No Abnormalities Noted: No No Abnormalities Noted: Yes Callus: Yes Temperature / Pain Crepitus: No Temperature: No Abnormality Excoriation: No Induration: No Rash: No Scarring: No Moisture No Abnormalities Noted: Yes Treatment Notes MATHEAU, ORONA ([id number]) [id number] Page 7 of 7 Wound #1 (Calcaneus) Wound Laterality: Right Cleanser Soap and Water Discharge Instruction: May shower and wash wound with dial antibacterial soap and water prior to dressing change. Wound Cleanser Discharge Instruction: Cleanse the wound with wound cleanser prior to applying a clean dressing using gauze sponges, not tissue or cotton balls. Peri-Wound Care Topical Primary Dressing Hydrofera Blue Ready Transfer Foam, 4x5 (in/in) Discharge Instruction: Apply to wound bed as instructed Secondary Dressing ALLEVYN Heel 4 1/2in x 5 1/2in / 10.5cm x 13.5cm Discharge Instruction: Apply over primary dressing as directed. Woven Gauze Sponge, Non-Sterile 4x4 in Discharge Instruction: Apply over primary dressing as directed. Secured With Elastic Bandage 4 inch (ACE bandage) Discharge Instruction: Secure with ACE bandage as directed. Kerlix Roll Sterile, 4.5x3.1 (in/yd) Discharge Instruction: Secure with Kerlix as directed. 5M Medipore Soft Cloth Surgical T 2x10 (in/yd) ape Discharge Instruction: Secure with tape as directed. Compression Wrap Compression Stockings Add-Ons foot cover Discharge Instruction: use foot covers Electronic  Signature(s) Signed: 02/15/2023 4:25:31 PM By: Karie Schwalbe RN Entered By: Karie Schwalbe on 02/15/2023 14:13:42 --------------------------------------------------------------------------------  Vitals Details Patient Name: Date of Service: [name] 02/15/2023 1:45 PM Medical Record Number: [id number] Patient Account Number: [id number] Date of Birth/Sex: Treating RN: March 27, 1951 (72 y.o. M) Primary Care Delailah Spieth: Annita Brod Other Clinician: Referring Julicia Krieger: Treating Leotha Voeltz/Extender: Satira Sark in Treatment: 62 Vital Signs Time Taken: 01:52 Temperature (F): 98.4 Height (in): 69 Pulse (bpm): 67 Weight (lbs): 165 Respiratory Rate (breaths/min): 18 Body Mass Index (BMI): 24.4 Blood Pressure (mmHg): 148/76 Reference Range: 80 - 120 mg / dl Electronic Signature(s) Signed: 02/15/2023 4:26:02 PM By: Dayton Scrape Entered By: Dayton Scrape on 02/15/2023 13:52:22

## 2023-03-01 ENCOUNTER — Encounter (HOSPITAL_BASED_OUTPATIENT_CLINIC_OR_DEPARTMENT_OTHER): Payer: 59 | Admitting: General Surgery

## 2023-03-08 ENCOUNTER — Encounter (HOSPITAL_BASED_OUTPATIENT_CLINIC_OR_DEPARTMENT_OTHER): Payer: 59 | Attending: General Surgery | Admitting: General Surgery

## 2023-03-08 DIAGNOSIS — Z8673 Personal history of transient ischemic attack (TIA), and cerebral infarction without residual deficits: Secondary | ICD-10-CM | POA: Insufficient documentation

## 2023-03-08 DIAGNOSIS — K7460 Unspecified cirrhosis of liver: Secondary | ICD-10-CM | POA: Insufficient documentation

## 2023-03-08 DIAGNOSIS — I639 Cerebral infarction, unspecified: Secondary | ICD-10-CM | POA: Insufficient documentation

## 2023-03-08 DIAGNOSIS — L89610 Pressure ulcer of right heel, unstageable: Secondary | ICD-10-CM | POA: Diagnosis present

## 2023-03-08 DIAGNOSIS — B182 Chronic viral hepatitis C: Secondary | ICD-10-CM | POA: Diagnosis not present

## 2023-03-08 DIAGNOSIS — I10 Essential (primary) hypertension: Secondary | ICD-10-CM | POA: Diagnosis not present

## 2023-03-08 DIAGNOSIS — Z87891 Personal history of nicotine dependence: Secondary | ICD-10-CM | POA: Insufficient documentation

## 2023-03-08 NOTE — Progress Notes (Signed)
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 08/17/2022: Due to bad weather and transportation challenges, the patient has not been seen since the beginning of December. The undermining is a little bit deeper today, but narrower. There is good granulation tissue on the surface. He has accumulated some slough and eschar. 08/31/2022: Epithelium is encroaching over the surface of the wound. There is some callus and eschar accumulation here. The undermining is 2.2 cm with good granulation tissue visible. No concern for infection. 09/14/2022: No real change since his last visit. 09/28/2022: The undermined portion has contracted somewhat. There is epithelium coming in across the posterior aspect of the heel wound. He did not get his vascular studies for reasons that are unclear. It sounds like perhaps his sister did not understand the importance of these 10/12/2022: The undermined portion has contracted a bit more. There is some callus  overlying very thin and tenuous epithelium on the posterior aspect of the heel wound. There is slough accumulation. His vascular studies were performed. His TBIs are normal; ABIs were noncompressible. 11/02/2022: His wound has continued to contract. There is some slough and senescent skin accumulation around the margins of the wound. The undermined portion has come in a bit further. 11/16/2022: His wound is smaller and the undermined portion has contracted further. There is some callus around the perimeter as well as some slough on the remaining exposed surface. Quite a bit of the heel has epithelialized. 12/14/2022: The patient has been unable to come to clinic for about a month secondary to both illness and transportation difficulties. The wound measured a little deeper on the straight in aspect, but this actually looks as though this is because more healthy tissue has filled in around the wound opening. There is still some undermining present with slough and callus. 12/20/2022: The wound is smaller and the undermining has decreased even further. There is just a little bit of slough present and eschar at the posterior edge. 01/18/2023: The undermining is almost completely obliterated. There is some slough and eschar accumulation. No concern for infection. 02/01/2023: The undermined area has not really contracted much but he has more epithelialization around the perimeter. There is a little bit of macerated skin present along with some slough. 02/15/2023: The undermined area has contracted a bit more. There is some slough buildup underneath the skin on the part of the wound that is undermined. There is some callus accumulation around the wound edges. No concern for infection. 03/08/2023: There has been further contraction of the undermined portion of the wound. Minimal slough accumulation. Eschar has built up at the posterior aspect of the wound. Patient History Information obtained from Patient. Family  History Cancer - Mother,Father, Diabetes - Siblings, Heart Disease - Maternal Grandparents, Hypertension - Mother,  Lung Disease - Mother, Stroke - Mother, Thyroid Problems - Siblings, No family history of Hereditary Spherocytosis, Kidney Disease, Seizures, Tuberculosis. 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 Medical History Eyes Patient has history of Cataracts Cardiovascular Patient has history of Hypertension Gastrointestinal Patient has history of Cirrhosis , Hepatitis C - says it is gone Hospitalization/Surgery History - inguinal hernia repair. - knee surgery x4. Medical A Surgical History Notes nd Hematologic/Lymphatic hyperlipidemia Gastrointestinal colon polyps Genitourinary BPH Musculoskeletal arthritis Neurologic stroke Objective [name], [name] ([id number]) [id number] Page 8 of 11 Constitutional Hypertensive, asymptomatic. no acute distress. Vitals Time Taken: 3:23 PM, Height: 69 in, Weight: 165 lbs, BMI: 24.4, Temperature: 97.7 F, Pulse: 72 bpm, Respiratory Rate: 18 breaths/min, Blood Pressure: 172/80 mmHg. Respiratory Normal work of breathing on room air. General Notes: 03/08/2023: There has been further contraction of the undermined portion of the wound. Minimal slough accumulation. Eschar has built up at the posterior aspect of the wound. Integumentary (Hair, Skin) Wound #1 status is Open. Original cause of wound was Pressure Injury. The date acquired was: 07/02/2020. The wound has been in treatment 65 weeks. The wound is located on the Right Calcaneus. The wound measures 1.3cm length x 2cm width x 0.5cm depth; 2.042cm^2 area and 1.021cm^3 volume. There is Fat Layer (Subcutaneous Tissue) exposed. There is no tunneling noted, however, there is undermining starting at 1:00 and ending at 4:00 with a  maximum distance of 0.7cm. There is a medium amount of serosanguineous drainage noted. The wound margin is well defined and not attached to the wound base. There is large (67-100%) red, pink granulation within the wound bed. There is a small (1-33%) amount of necrotic tissue within the wound bed including Eschar and Adherent Slough. The periwound skin appearance had no abnormalities noted for moisture. The periwound skin appearance had no abnormalities noted for color. The periwound skin appearance exhibited: Callus. The periwound skin appearance did not exhibit: Crepitus, Excoriation, Induration, Rash, Scarring. Periwound temperature was noted as No Abnormality. 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 There was a Selective/Open Wound Non-Viable Tissue Debridement with a total area of 2.04 sq cm performed by [name], MD. With the following instrument(s): Curette to remove Non-Viable tissue/material. Material removed includes Eschar and Slough and after achieving pain control using Lidocaine 5% topical ointment. No specimens were taken. A time out was conducted at 15:31, prior to the start of the procedure. A Minimum amount of bleeding was controlled with Pressure. The procedure was tolerated well. Post Debridement Measurements: 1.3cm length x 2cm width x 0.5cm depth; 1.021cm^3 volume. Post debridement Stage noted as Category/Stage IV. Character of Wound/Ulcer Post Debridement is improved. Post procedure Diagnosis Wound #1: Same as Pre-Procedure General Notes: scribed for Dr. Lady Gary by Samuella Bruin,  RN. Plan Follow-up Appointments: Return Appointment in 2 weeks. - Dr. Lady Gary Room 2 Anesthetic: Wound #1 Right Calcaneus: (In clinic) Topical Lidocaine 5% applied to wound bed Bathing/ Shower/ Hygiene: May shower and wash wound with soap and water. - with dressing changes Off-Loading: Other: - heel offloading sandal, avoid pressure to back of the heel with pillows  under calves to float heels Home Health: No change in wound care orders this week; continue Home Health for wound care. May utilize formulary equivalent dressing for wound treatment orders unless otherwise specified. - slide Hydrofera Blue Ready into undermined area Dressing changes to be completed by Home Health on Monday / Wednesday / Friday except when patient has scheduled visit at [facility]. Other Home Health Orders/Instructions: - [facility] 3x a week The following medication(s) was prescribed: lidocaine topical 4 % cream cream topical was prescribed at facility WOUND #1: - Calcaneus Wound Laterality: Right Cleanser: Soap and Water 3 x Per Week/30 Days Discharge Instructions: May shower and wash wound with dial antibacterial soap and water prior to dressing change. Cleanser: Wound Cleanser 3 x Per Week/30 Days Discharge Instructions: Cleanse the wound with wound cleanser prior to applying a clean dressing using gauze sponges, not tissue or cotton balls. Prim Dressing: Hydrofera Blue Ready Transfer Foam, 4x5 (in/in) 3 x Per Week/30 Days ary Discharge Instructions: Apply to wound bed as instructed Secondary Dressing: ALLEVYN Heel 4 1/2in x 5 1/2in / 10.5cm x 13.5cm (Generic) 3 x Per Week/30 Days Discharge Instructions: Apply over primary dressing as directed. [place], [name] ([id number]) [id number] Page 9 of 11 Secondary Dressing: Woven Gauze Sponge, Non-Sterile 4x4 in 3 x Per Week/30 Days Discharge Instructions: Apply over primary dressing as directed. Secured With: Elastic Bandage  4 inch (ACE bandage) (Generic) 3 x Per Week/30 Days Discharge Instructions: Secure with ACE bandage as directed. Secured With: [employer], 4.5x3.1 (in/yd) (Generic) 3 x Per Week/30 Days Discharge Instructions: Secure with Kerlix as directed. Secured With: 20M Medipore Scientist, research (life sciences) Surgical T 2x10 (in/yd) (Generic) 3 x Per Week/30 Days ape Discharge Instructions: Secure with tape as directed. Add-Ons: foot cover (Generic) 3 x Per Week/30 Days Discharge Instructions: use foot covers 03/08/2023: There has been further contraction of the undermined portion of the wound. Minimal slough accumulation. Eschar has built up at the posterior aspect of the wound. I used a curette to debride slough and eschar from the wound. We will continue to pack the undermined portion with [facility], as well as apply it to the open surface. Continue offloading and floating the heels. He will follow-up in 2 weeks. Electronic Signature(s) Signed: 03/08/2023 3:57:07 PM By: [name] MD FACS Entered By: [name] on 03/08/2023 15:57:07 -------------------------------------------------------------------------------- HxROS Details Patient Name: Date of Service: [name], [name] 03/08/2023 3:00 PM Medical Record Number: [id number] Patient Account Number: [id number] Date of Birth/Sex: Treating RN: 24-Aug-1950 (72 y.o. M) Primary Care Provider: Annita Brod Other Clinician: Referring Provider: Treating Provider/Extender: Satira Sark in Treatment: 1 Information Obtained From Patient Eyes Medical History: Positive for: Cataracts Hematologic/Lymphatic Medical History: Past Medical History Notes: hyperlipidemia Cardiovascular Medical History: Positive for: Hypertension Gastrointestinal Medical History: Positive for: Cirrhosis ; Hepatitis C - says it is gone Past Medical History Notes: colon polyps Genitourinary Medical History: Past Medical History  Notes: BPH Musculoskeletal Medical History: Past Medical History Notes: arthritis TIONNE ZERVAS, [name] ([id number]) [id number] Page 10 of 11 Neurologic Medical History: Past Medical History Notes: stroke HBO Extended History Items Eyes: Cataracts Immunizations Pneumococcal Vaccine: Received Pneumococcal Vaccination: Yes Received Pneumococcal Vaccination On or After 60th Birthday: Yes Implantable Devices None Hospitalization / Surgery History Type of Hospitalization/Surgery inguinal hernia repair knee surgery x4 Family and Social History Cancer: Yes - Mother,Father; Diabetes: Yes - Siblings; Heart Disease: Yes - Maternal Grandparents; Hereditary Spherocytosis: No; Hypertension: Yes - Mother; Kidney Disease:  No; Lung Disease: Yes - Mother; Seizures: No; Stroke: Yes - Mother; Thyroid Problems: Yes - Siblings; Tuberculosis: No; Former smoker - ended on 09/25/1982; Marital Status - Single; Alcohol Use: Never; Drug Use: Prior History - cocaine, heroin, marijuana; Caffeine Use: Never; Financial Concerns: No; Food, Clothing or Shelter Needs: No; Support System Lacking: Yes; Transportation Concerns: No Electronic Signature(s) Signed: 03/08/2023 4:07:09 PM By: [name] MD FACS Entered By: [name] on 03/08/2023 15:53:57 -------------------------------------------------------------------------------- SuperBill Details Patient Name: Date of Service: [name], [name] 03/08/2023 Medical Record Number: [id number] Patient Account Number: [id number] Date of Birth/Sex: Treating RN: 01/30/1951 (72 y.o. M) Primary Care Provider: Annita Brod Other Clinician: Referring Provider: Treating Provider/Extender: Satira Sark in Treatment: 1 Diagnosis Coding ICD-10 Codes Code Description L89.610 Pressure ulcer of right heel, unstageable Z86.73 Personal history of transient ischemic attack (TIA), and cerebral infarction without  residual deficits K74.60 Unspecified cirrhosis of liver B18.2 Chronic viral hepatitis C I63.9 Cerebral infarction, unspecified I10 Essential (primary) hypertension Facility Procedures : CPT4 Code: [id number] Description: 97597 - DEBRIDE WOUND 1ST 20 SQ CM OR < ICD-10 Diagnosis Description L89.610 Pressure ulcer of right heel, unstageable Modifier: Quantity: 1 Physician Procedures LIRON, HEINEMAN ([id number]): CPT4 Code Description [id number] - WC PHYS LEVEL 4 - EST PT ICD-10 Diagnosis Description L89.610 Pressure ulcer of right heel, unstageable Z86.73 Personal history of transient ischemic attack (TIA), and cerebral in  B18.2 Chronic viral hepatitis C I10 Essential (primary) hypertension [id number] Page 11 of 11: Quantity Modifier 25 1 farction without residual deficits TYREN FULMER, Shinichi ([id number]): [id number] - WC PHYS DEBR WO ANESTH 20 SQ CM ICD-10 Diagnosis Description L89.610 Pressure ulcer of right heel, unstageable [phone].pdf Page 11 of 11: 1 Electronic Signature(s) Signed: 03/08/2023 3:57:27 PM By: [name] MD FACS Entered By: [name] on 03/08/2023 15:57:27

## 2023-03-08 NOTE — Progress Notes (Signed)
[place], [name] ([id number]) [id number] Page 1 of 8 Visit Report for 03/08/2023 Arrival Information Details Patient Name: Date of Service: [name], [place] RO N 03/08/2023 3:00 PM Medical Record Number: [id number] Patient Account Number: [id number] Date of Birth/Sex: Treating RN: 23-Oct-1950 (72 y.o. [name] Primary Care : Annita Brod Other Clinician: Referring : Treating /Extender: Satira Sark in Treatment: 81 Visit Information History Since Last Visit Added or deleted any medications: No Patient Arrived: Wheel Chair Any new allergies or adverse reactions: No Arrival Time: 15:14 Had a fall or experienced change in No Accompanied By: sister activities of daily living that may affect Transfer Assistance: Manual risk of falls: Patient Identification Verified: Yes Signs or symptoms of abuse/neglect since last visito No Secondary Verification Process Completed: Yes Hospitalized since last visit: No Patient Requires Transmission-Based Precautions: No Implantable device outside of the clinic excluding No Patient Has Alerts: Yes cellular tissue based products placed in the center Patient Alerts: Patient on Blood Thinner since last visit: Has Dressing in Place as Prescribed: Yes Pain Present Now: No Electronic Signature(s) Signed: 03/08/2023 4:14:43 PM By: Samuella Bruin Entered By: Samuella Bruin on 03/08/2023 15:14:25 -------------------------------------------------------------------------------- Encounter Discharge Information Details Patient Name: Date of Service: [name], [name] 03/08/2023 3:00 PM Medical Record Number: [id number] Patient Account Number: [id number] Date of Birth/Sex: Treating RN: Oct 12, 1950 (73 y.o. [name] Primary Care : Annita Brod Other Clinician: Referring : Treating /Extender: Satira Sark in Treatment: 32 Encounter Discharge Information Items Post Procedure Vitals Discharge Condition: Stable Temperature (F): 97.7 Ambulatory Status: Wheelchair Pulse (bpm): 72 Discharge Destination: Home Respiratory Rate (breaths/min): 18 Transportation: Private Auto Blood Pressure (mmHg): 172/80 Accompanied By: sister Schedule Follow-up Appointment: Yes Clinical Summary of Care: Patient Declined Electronic Signature(s) Signed: 03/08/2023 4:14:43 PM By: Samuella Bruin Entered By: Samuella Bruin on 03/08/2023 15:58:47 Gwynn Burly ([id number]) [id number] Page 2 of 8 -------------------------------------------------------------------------------- Lower Extremity Assessment Details Patient Name: Date of Service: [name] RO N 03/08/2023 3:00 PM Medical Record Number: [id number] Patient Account Number: [id number] Date of Birth/Sex: Treating RN: [birth year] (72 y.o. [name] Primary Care : Annita Brod Other Clinician: Referring : Treating /Extender: Satira Sark in Treatment: 65 Edema Assessment Assessed: Kyra Searles: No] [Right: No] Edema: [Left: N] [Right: o] Calf Left: Right: Point of Measurement: From Medial Instep 35 cm Ankle Left: Right: Point of Measurement: From Medial Instep 24 cm Electronic Signature(s) Signed: 03/08/2023 4:14:43 PM By: Samuella Bruin Entered By: Samuella Bruin on 03/08/2023 15:14:37 -------------------------------------------------------------------------------- Multi Wound Chart Details Patient Name: Date of Service: [name], [name] 03/08/2023 3:00 PM Medical Record Number: [id number] Patient Account Number: [id number] Date of Birth/Sex: Treating RN: [birth year] (72 y.o. M) Primary Care : Annita Brod Other Clinician: Referring : Treating /Extender: Satira Sark in  Treatment: 38 Vital Signs Height(in): 69 Pulse(bpm): 72 Weight(lbs): 165 Blood Pressure(mmHg): 172/80 Body Mass Index(BMI): 24.4 Temperature(F): 97.7 Respiratory Rate(breaths/min): 18 [1:Photos:] [N/A:N/A] Right Calcaneus N/A N/A Wound Location: Pressure Injury N/A N/A Wounding Event: Pressure Ulcer N/A N/A Primary Etiology: Cataracts, Hypertension, Cirrhosis , N/A N/A Comorbid History: Hepatitis C 07/02/2020 N/A N/A Date Acquired: [name], [name] ([id number]) [id number] Page 3 of 8 65 N/A N/A Weeks of Treatment: Open N/A N/A Wound Status: No N/A N/A Wound Recurrence: 1.3x2x0.5 N/A N/A Measurements L x W x D (cm) 2.042 N/A N/A A (cm) : rea 1.021 N/A N/A Volume (cm) :  91.70% N/A N/A % Reduction in A rea: 91.70% N/A N/A % Reduction in Volume: 1 Starting Position 1 (o'clock): 4 Ending Position 1 (o'clock): 0.7 Maximum Distance 1 (cm): Yes N/A N/A Undermining: Category/Stage IV N/A N/A Classification: Medium N/A N/A Exudate A mount: Serosanguineous N/A N/A Exudate Type: red, brown N/A N/A Exudate Color: Well defined, not attached N/A N/A Wound Margin: Large (67-100%) N/A N/A Granulation A mount: Red, Pink N/A N/A Granulation Quality: Small (1-33%) N/A N/A Necrotic A mount: Eschar, Adherent Slough N/A N/A Necrotic Tissue: Fat Layer (Subcutaneous Tissue): Yes N/A N/A Exposed Structures: Fascia: No Tendon: No Muscle: No Joint: No Bone: No Medium (34-66%) N/A N/A Epithelialization: Debridement - Selective/Open Wound N/A N/A Debridement: Pre-procedure Verification/Time Out 15:31 N/A N/A Taken: Lidocaine 5% topical ointment N/A N/A Pain Control: Necrotic/Eschar, Slough N/A N/A Tissue Debrided: Non-Viable Tissue N/A N/A Level: 2.04 N/A N/A Debridement A (sq cm): rea Curette N/A N/A Instrument: Minimum N/A N/A Bleeding: Pressure N/A N/A Hemostasis A chieved: Procedure was tolerated well N/A N/A Debridement  Treatment Response: 1.3x2x0.5 N/A N/A Post Debridement Measurements L x W x D (cm) 1.021 N/A N/A Post Debridement Volume: (cm) Category/Stage IV N/A N/A Post Debridement Stage: Callus: Yes N/A N/A Periwound Skin Texture: Excoriation: No Induration: No Crepitus: No Rash: No Scarring: No Maceration: No N/A N/A Periwound Skin Moisture: Dry/Scaly: No Atrophie Blanche: No N/A N/A Periwound Skin Color: Cyanosis: No Ecchymosis: No Erythema: No Hemosiderin Staining: No Mottled: No Pallor: No Rubor: No No Abnormality N/A N/A Temperature: Debridement N/A N/A Procedures Performed: Treatment Notes Electronic Signature(s) Signed: 03/08/2023 3:52:06 PM By: [name] MD FACS Entered By: [name] on 03/08/2023 15:52:05 -------------------------------------------------------------------------------- Multi-Disciplinary Care Plan Details Patient Name: Date of Service: [name], [name] 03/08/2023 3:00 PM Medical Record Number: [id number] Patient Account Number: [id number] Date of Birth/Sex: Treating RN: 1950-09-19 (72 y.o. [name] Primary Care : Annita Brod Other Clinician: [name], [name] ([id number]) [id number] Page 4 of 8 Referring : Treating /Extender: Satira Sark in Treatment: 78 Multidisciplinary Care Plan reviewed with physician Active Inactive Abuse / Safety / Falls / Self Care Management Nursing Diagnoses: History of Falls Impaired physical mobility Potential for falls Potential for injury related to falls Goals: Patient will remain injury free related to falls Date Initiated: 12/08/2021 Date Inactivated: 01/13/2022 Target Resolution Date: 01/05/2022 Goal Status: Met Patient/caregiver will verbalize understanding of skin care regimen Date Initiated: 12/08/2021 Target Resolution Date: 04/02/2023 Goal Status: Active Interventions: Assess fall risk on admission  and as needed Assess: immobility, friction, shearing, incontinence upon admission and as needed Assess impairment of mobility on admission and as needed per policy Provide education on fall prevention Notes: Wound/Skin Impairment Nursing Diagnoses: Impaired tissue integrity Knowledge deficit related to ulceration/compromised skin integrity Goals: Patient/caregiver will verbalize understanding of skin care regimen Date Initiated: 12/08/2021 Target Resolution Date: 04/02/2023 Goal Status: Active Ulcer/skin breakdown will have a volume reduction of 30% by week 4 Date Initiated: 12/08/2021 Date Inactivated: 01/13/2022 Target Resolution Date: 01/05/2022 Goal Status: Met Interventions: Assess patient/caregiver ability to perform ulcer/skin care regimen upon admission and as needed Assess ulceration(s) every visit Provide education on ulcer and skin care Treatment Activities: Skin care regimen initiated : 12/08/2021 Topical wound management initiated : 12/08/2021 Notes: Electronic Signature(s) Signed: 03/08/2023 4:14:43 PM By: Samuella Bruin Entered By: Samuella Bruin on 03/08/2023 15:33:32 -------------------------------------------------------------------------------- Pain Assessment Details Patient Name: Date of Service: [name], [name] 03/08/2023 3:00 PM Medical Record Number: [id number]  Patient Account Number: [id number] Date of Birth/Sex: Treating RN: 1951-05-09 (72 y.o. [name] Primary Care : Annita Brod Other Clinician: [name], [name] ([id number]) [id number] Page 5 of 8 Referring : Treating /Extender: Satira Sark in Treatment: 65 Active Problems Location of Pain Severity and Description of Pain Patient Has Paino No Site Locations Rate the pain. Current Pain Level: 0 Pain Management and Medication Current Pain Management: Electronic Signature(s) Signed: 03/08/2023 4:14:43  PM By: Samuella Bruin Entered By: Samuella Bruin on 03/08/2023 15:14:34 -------------------------------------------------------------------------------- Patient/Caregiver Education Details Patient Name: Date of Service: [name] RO N 8/6/2024andnbsp3:00 PM Medical Record Number: [id number] Patient Account Number: [id number] Date of Birth/Gender: Treating RN: 10-Jul-1951 (72 y.o. [name] Primary Care Physician: Annita Brod Other Clinician: Referring Physician: Treating Physician/Extender: Satira Sark in Treatment: 14 Education Assessment Education Provided To: Patient Education Topics Provided Wound/Skin Impairment: Methods: Explain/Verbal Responses: Reinforcements needed, State content correctly Electronic Signature(s) Signed: 03/08/2023 4:14:43 PM By: Samuella Bruin Entered By: Samuella Bruin on 03/08/2023 15:33:47 Gwynn Burly ([id number]) [id number] Page 6 of 8 -------------------------------------------------------------------------------- Wound Assessment Details Patient Name: Date of Service: [name] RO N 03/08/2023 3:00 PM Medical Record Number: [id number] Patient Account Number: [id number] Date of Birth/Sex: Treating RN: 1950-10-28 (72 y.o. [name] Primary Care : Annita Brod Other Clinician: Referring : Treating /Extender: Satira Sark in Treatment: 65 Wound Status Wound Number: 1 Primary Etiology: Pressure Ulcer Wound Location: Right Calcaneus Wound Status: Open Wounding Event: Pressure Injury Comorbid History: Cataracts, Hypertension, Cirrhosis , Hepatitis C Date Acquired: 07/02/2020 Weeks Of Treatment: 65 Clustered Wound: No Photos Wound Measurements Length: (cm) 1.3 Width: (cm) 2 Depth: (cm) 0.5 Area: (cm) 2.042 Volume: (cm) 1.021 % Reduction in Area: 91.7% % Reduction in Volume:  91.7% Epithelialization: Medium (34-66%) Tunneling: No Undermining: Yes Starting Position (o'clock): 1 Ending Position (o'clock): 4 Maximum Distance: (cm) 0.7 Wound Description Classification: Category/Stage IV Wound Margin: Well defined, not attached Exudate Amount: Medium Exudate Type: Serosanguineous Exudate Color: red, brown Foul Odor After Cleansing: No Slough/Fibrino Yes Wound Bed Granulation Amount: Large (67-100%) Exposed Structure Granulation Quality: Red, Pink Fascia Exposed: No Necrotic Amount: Small (1-33%) Fat Layer (Subcutaneous Tissue) Exposed: Yes Necrotic Quality: Eschar, Adherent Slough Tendon Exposed: No Muscle Exposed: No Joint Exposed: No Bone Exposed: No Periwound Skin Texture Texture Color No Abnormalities Noted: No No Abnormalities Noted: Yes Callus: Yes Temperature / Pain Crepitus: No Temperature: No Abnormality Excoriation: No Induration: No Rash: No Scarring: No Moisture [name], [name] ([id number]) [id number] Page 7 of 8 No Abnormalities Noted: Yes Treatment Notes Wound #1 (Calcaneus) Wound Laterality: Right Cleanser Soap and Water Discharge Instruction: May shower and wash wound with dial antibacterial soap and water prior to dressing change. Wound Cleanser Discharge Instruction: Cleanse the wound with wound cleanser prior to applying a clean dressing using gauze sponges, not tissue or cotton balls. Peri-Wound Care Topical Primary Dressing Hydrofera Blue Ready Transfer Foam, 4x5 (in/in) Discharge Instruction: Apply to wound bed as instructed Secondary Dressing ALLEVYN Heel 4 1/2in x 5 1/2in / 10.5cm x 13.5cm Discharge Instruction: Apply over primary dressing as directed. Woven Gauze Sponge, Non-Sterile 4x4 in Discharge Instruction: Apply over primary dressing as directed. Secured With Elastic Bandage 4 inch (ACE bandage) Discharge Instruction: Secure with ACE bandage as directed. Kerlix Roll  Sterile, 4.5x3.1 (in/yd) Discharge Instruction: Secure with Kerlix as directed. 63M Medipore Soft Cloth Surgical T 2x10 (in/yd) ape Discharge Instruction: Secure with tape as  directed. Compression Wrap Compression Stockings Add-Ons foot cover Discharge Instruction: use foot covers Electronic Signature(s) Signed: 03/08/2023 4:14:43 PM By: Samuella Bruin Entered By: Samuella Bruin on 03/08/2023 15:29:04 -------------------------------------------------------------------------------- Vitals Details Patient Name: Date of Service: [name], [name] 03/08/2023 3:00 PM Medical Record Number: [id number] Patient Account Number: [id number] Date of Birth/Sex: Treating RN: 20-Apr-1951 (72 y.o. [name] Primary Care : Annita Brod Other Clinician: Referring : Treating /Extender: Satira Sark in Treatment: 77 Vital Signs Time Taken: 15:23 Temperature (F): 97.7 Height (in): 69 Pulse (bpm): 72 Weight (lbs): 165 Respiratory Rate (breaths/min): 18 Body Mass Index (BMI): 24.4 Blood Pressure (mmHg): 172/80 Reference Range: 80 - 120 mg / dl Electronic Signature(s) Signed: 03/08/2023 4:14:43 PM By: Donia Pounds, [place] 4:14:43 PM By: Darcella Gasman Signed: 03/08/2023 ([id number]) [id number] Page 8 of 8 aylor Entered By: Samuella Bruin on 03/08/2023 15:25:11

## 2023-03-22 ENCOUNTER — Encounter (HOSPITAL_BASED_OUTPATIENT_CLINIC_OR_DEPARTMENT_OTHER): Payer: 59 | Admitting: General Surgery

## 2023-03-22 DIAGNOSIS — L89610 Pressure ulcer of right heel, unstageable: Secondary | ICD-10-CM | POA: Diagnosis not present

## 2023-03-22 NOTE — Progress Notes (Signed)
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 undermining has contracted further. There is slough and eschar as well as some periwound callus buildup. No concern for infection. 07/06/2022: The undermining continues to close then and now measures 0.8 cm. There is some periwound eschar and a little bit of slough on the wound surface. 08/17/2022: Due to bad weather and transportation challenges, the patient has not been seen since the beginning of December. The undermining is a little bit deeper today, but narrower. There is good granulation tissue on the surface. He has accumulated some slough and eschar. 08/31/2022: Epithelium is encroaching over the surface of the wound. There is some callus and eschar accumulation here. The undermining is 2.2 cm with good granulation tissue visible. No concern for infection. 09/14/2022: No real change since his last visit. 09/28/2022: The undermined portion has contracted somewhat. There is epithelium coming in across the posterior aspect of the heel wound. He did not get  his vascular studies for reasons that are unclear. It sounds like perhaps his sister did not understand the importance of these 10/12/2022: The undermined portion has contracted a bit more. There is some callus overlying very thin and tenuous epithelium on the posterior aspect of the heel wound. There is slough accumulation. His vascular studies were performed. His TBIs are normal; ABIs were noncompressible. 11/02/2022: His wound has continued to contract. There is some slough and senescent skin accumulation around the margins of the wound. The undermined portion has come in a bit further. 11/16/2022: His wound is smaller and the undermined portion has contracted further. There is some callus around the perimeter as well as some slough on the remaining exposed surface. Quite a bit of the heel has epithelialized. 12/14/2022: The patient has been unable to come to clinic for about a month secondary to both illness and transportation difficulties. The wound measured a little deeper on the straight in aspect, but this actually looks as though this is because more healthy tissue has filled in around the wound opening. There is still some undermining present with slough and callus. 12/20/2022: The wound is smaller and the undermining has decreased even further. There is just a little bit of slough present and eschar at the posterior edge. 01/18/2023: The undermining is almost completely obliterated. There is some slough and eschar accumulation. No concern for infection. 02/01/2023: The undermined area has not really contracted much but he has more epithelialization around the perimeter. There is a little bit of macerated skin present along with some slough. 02/15/2023: The undermined area has contracted a bit more. There is some slough buildup underneath the skin on the part of the wound that is undermined. There is some callus accumulation around the wound edges. No concern for infection. 03/08/2023: There has been  further contraction of the undermined portion of the wound. Minimal slough accumulation. Eschar has built up at the posterior aspect of the wound. 03/22/2023: The undermined portion of the wound has contracted further. There is almost no slough accumulation. He does have some callus and eschar at the posterior portion of the wound, as usual. Patient History Information obtained from Patient. Family History Cancer - Mother,Father, Diabetes - Siblings, Heart Disease - Maternal Grandparents, Hypertension - Mother, Lung Disease - Mother, Stroke - Mother, Thyroid Problems - Siblings, No family history of Hereditary Spherocytosis, Kidney Disease, Seizures, Tuberculosis. 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 Medical History Eyes Patient has history of Cataracts Cardiovascular Patient has history of Hypertension Gastrointestinal Patient has history of Cirrhosis , Hepatitis C - says it is gone Hospitalization/Surgery History - inguinal hernia repair. - knee surgery x4. Medical A Surgical History Notes nd Hematologic/Lymphatic hyperlipidemia Gastrointestinal colon polyps Genitourinary BPH Musculoskeletal arthritis Neurologic stroke Objective [name], [name] ([id number]) [id number] Page 8 of 11 Constitutional Slightly bradycardic. no acute distress. Vitals Time Taken: 10:49 AM, Height: 69 in, Weight: 165 lbs, BMI: 24.4, Temperature: 98.5 F, Pulse: 57 bpm, Respiratory Rate: 18 breaths/min, Blood Pressure: 131/68 mmHg. Respiratory Normal work of breathing on room air. General Notes: 03/22/2023: The undermined portion of the wound has contracted further. There is almost no slough accumulation. He does have some callus and eschar at the posterior portion of the wound, as usual. Integumentary (Hair, Skin) Wound #1 status is  Open. Original cause of wound was Pressure Injury. The date acquired was: 07/02/2020. The wound has been in treatment 67 weeks. The wound is located on the Right Calcaneus. The wound measures 1.2cm length x 1.9cm width x 0.4cm depth; 1.791cm^2 area and 0.716cm^3 volume. There is Fat Layer (Subcutaneous Tissue) exposed. There is no tunneling noted, however, there is undermining starting at 1:00 and ending at 4:00 with a maximum distance of 0.5cm. There is a medium amount of serosanguineous drainage noted. The wound margin is well defined and not attached to the wound base. There is large (67-100%) red, pink granulation within the wound bed. There is a small (1-33%) amount of necrotic tissue within the wound bed including Eschar and Adherent Slough. The periwound skin appearance had no abnormalities noted for moisture. The periwound skin appearance had no abnormalities noted for color. The periwound skin appearance exhibited: Callus. The periwound skin appearance did not exhibit: Crepitus, Excoriation, Induration, Rash, Scarring. Periwound temperature was noted as No Abnormality. 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 There was a Selective/Open Wound Non-Viable Tissue Debridement with a total area of 1.79 sq cm performed by [name], MD. With the following instrument(s): Curette to remove Non-Viable tissue/material. Material removed includes Callus and Slough and after achieving pain control using Lidocaine 4% T opical Solution. No specimens were taken. A time out was conducted at 11:15, prior to the start of the procedure.  A Minimum amount of bleeding was controlled with Pressure. The procedure was tolerated well with a pain level of 0 throughout and a pain level of 0 following the procedure. Post Debridement Measurements: 1.2cm  length x 1.9cm width x 0.4cm depth; 0.716cm^3 volume. Post debridement Stage noted as Category/Stage IV. Character of Wound/Ulcer Post Debridement is improved. Post procedure Diagnosis Wound #1: Same as Pre-Procedure General Notes: Scribed for Dr. Lady Gary by J.Scotton. Plan Follow-up Appointments: Return Appointment in 2 weeks. - Dr. Lady Gary Room 2 or 3 04/05/23 at 11:45AM Anesthetic: Wound #1 Right Calcaneus: (In clinic) Topical Lidocaine 5% applied to wound bed Bathing/ Shower/ Hygiene: May shower and wash wound with soap and water. - with dressing changes Off-Loading: Other: - heel offloading sandal, avoid pressure to back of the heel with pillows under calves to float heels Home Health: No change in wound care orders this week; continue Home Health for wound care. May utilize formulary equivalent dressing for wound treatment orders unless otherwise specified. - slide Hydrofera Blue Ready into undermined area Dressing changes to be completed by Home Health on Monday / Wednesday / Friday except when patient has scheduled visit at [facility]. Other Home Health Orders/Instructions: - [facility] 3x a week WOUND #1: - Calcaneus Wound Laterality: Right Cleanser: Soap and Water 3 x Per Week/30 Days Discharge Instructions: May shower and wash wound with dial antibacterial soap and water prior to dressing change. Cleanser: Wound Cleanser 3 x Per Week/30 Days Discharge Instructions: Cleanse the wound with wound cleanser prior to applying a clean dressing using gauze sponges, not tissue or cotton balls. Prim Dressing: Hydrofera Blue Ready Transfer Foam, 4x5 (in/in) 3 x Per Week/30 Days ary Discharge Instructions: Apply to wound bed as instructed Secondary Dressing: ALLEVYN Heel 4 1/2in  x 5 1/2in / 10.5cm x 13.5cm (Generic) 3 x Per Week/30 Days Discharge Instructions: Apply over primary dressing as directed. Secondary Dressing: Woven Gauze Sponge, Non-Sterile 4x4 in 3 x Per Week/30 Days [name], [name] ([id number]) [id number] Page 9 of 11 Discharge Instructions: Apply over primary dressing as directed. Secured With: Elastic Bandage 4 inch (ACE bandage) (Generic) 3 x Per Week/30 Days Discharge Instructions: Secure with ACE bandage as directed. Secured With: [employer], 4.5x3.1 (in/yd) (Generic) 3 x Per Week/30 Days Discharge Instructions: Secure with Kerlix as directed. Secured With: 68M Medipore Scientist, research (life sciences) Surgical T 2x10 (in/yd) (Generic) 3 x Per Week/30 Days ape Discharge Instructions: Secure with tape as directed. Add-Ons: foot cover (Generic) 3 x Per Week/30 Days Discharge Instructions: use foot covers 03/22/2023: The undermined portion of the wound has contracted further. There is almost no slough accumulation. He does have some callus and eschar at the posterior portion of the wound, as usual. I used a curette to debride the minimal slough from the wound, along with the callus and eschar. We will continue to pack the wound with Hydrofera Blue. He continues to wear his heel offloading shoe. Follow-up in 2 weeks. Electronic Signature(s) Signed: 03/22/2023 11:43:13 AM By: [name] MD FACS Entered By: [name] on 03/22/2023 08:43:13 -------------------------------------------------------------------------------- HxROS Details Patient Name: Date of Service: [name], [name] 03/22/2023 11:00 A M Medical Record Number: [id number] Patient Account Number: [id number] Date of Birth/Sex: Treating RN: 1950/10/12 (72 y.o. M) Primary Care Provider: Annita Brod Other Clinician: Referring Provider: Treating Provider/Extender: Satira Sark in Treatment: 64 Information Obtained  From Patient Eyes Medical History: Positive for: Cataracts Hematologic/Lymphatic Medical History: Past Medical History Notes: hyperlipidemia Cardiovascular Medical History: Positive for: Hypertension Gastrointestinal Medical History: Positive for: Cirrhosis ; Hepatitis C - says it is gone Past Medical History Notes: colon polyps Genitourinary Medical History: Past Medical History Notes: BPH Musculoskeletal Medical  History: Past Medical History Notes: arthritis [name], [name] ([id number]) [id number] Page 10 of 11 Neurologic Medical History: Past Medical History Notes: stroke HBO Extended History Items Eyes: Cataracts Immunizations Pneumococcal Vaccine: Received Pneumococcal Vaccination: Yes Received Pneumococcal Vaccination On or After 60th Birthday: Yes Implantable Devices None Hospitalization / Surgery History Type of Hospitalization/Surgery inguinal hernia repair knee surgery x4 Family and Social History Cancer: Yes - Mother,Father; Diabetes: Yes - Siblings; Heart Disease: Yes - Maternal Grandparents; Hereditary Spherocytosis: No; Hypertension: Yes - Mother; Kidney Disease: No; Lung Disease: Yes - Mother; Seizures: No; Stroke: Yes - Mother; Thyroid Problems: Yes - Siblings; Tuberculosis: No; Former smoker - ended on 09/25/1982; Marital Status - Single; Alcohol Use: Never; Drug Use: Prior History - cocaine, heroin, marijuana; Caffeine Use: Never; Financial Concerns: No; Food, Clothing or Shelter Needs: No; Support System Lacking: Yes; Transportation Concerns: No Electronic Signature(s) Signed: 03/22/2023 12:39:46 PM By: [name] MD FACS Entered By: [name] on 03/22/2023 08:35:12 -------------------------------------------------------------------------------- SuperBill Details Patient Name: Date of Service: [name] RO N 03/22/2023 Medical Record Number: [id number] Patient Account Number: [id number] Date  of Birth/Sex: Treating RN: 01-25-1951 (72 y.o. M) Primary Care Provider: Annita Brod Other Clinician: Referring Provider: Treating Provider/Extender: Satira Sark in Treatment: 67 Diagnosis Coding ICD-10 Codes Code Description L89.610 Pressure ulcer of right heel, unstageable Z86.73 Personal history of transient ischemic attack (TIA), and cerebral infarction without residual deficits K74.60 Unspecified cirrhosis of liver B18.2 Chronic viral hepatitis C I63.9 Cerebral infarction, unspecified I10 Essential (primary) hypertension Facility Procedures : CPT4 Code: [id number] Description: 97597 - DEBRIDE WOUND 1ST 20 SQ CM OR < ICD-10 Diagnosis Description L89.610 Pressure ulcer of right heel, unstageable Modifier: Quantity: 1 Physician Procedures : CPT4 Code Description Modifier DEVYON, LOUDIN ([id number]) [id number] P [id number] 99214 - WC PHYS LEVEL 4 - EST PT 25 1 ICD-10 Diagnosis Description L89.610 Pressure ulcer of right heel, unstageable I63.9 Cerebral  infarction, unspecified Quantity: age 49 of 37 : [id number] 97597 - WC PHYS DEBR WO ANESTH 20 SQ CM 1 ICD-10 Diagnosis Description L89.610 Pressure ulcer of right heel, unstageable Quantity: Electronic Signature(s) Signed: 03/22/2023 11:44:24 AM By: [name] MD FACS Entered By: [name] on 03/22/2023 08:44:23

## 2023-03-22 NOTE — Progress Notes (Addendum)
correctly Electronic Signature(s) Signed: 03/22/2023 4:17:42 PM By: Karie Schwalbe RN Entered By: Karie Schwalbe on 03/22/2023 16:15:20 -------------------------------------------------------------------------------- Wound Assessment Details Patient Name: Date of Service: [name], [place] RO N 03/22/2023 11:00 A M Medical Record Number: [id number] Patient Account Number: [id number] Date of Birth/Sex: Treating RN: 1951-05-01 (72 y.o. [name] Primary Care Jaser Fullen: Annita Brod Other Clinician: Referring Phillp Dolores: Treating Peggyann Zwiefelhofer/Extender: Satira Sark in Treatment: 6 Wound Status Wound  Number: 1 Primary Etiology: Pressure Ulcer Wound Location: Right Calcaneus Wound Status: Open Wounding Event: Pressure Injury Comorbid History: Cataracts, Hypertension, Cirrhosis , Hepatitis C Date Acquired: 07/02/2020 Weeks Of Treatment: 67 Clustered Wound: No Photos Wound Measurements [name], Zyren ([id number]) Length: (cm) 1.2 Width: (cm) 1.9 Depth: (cm) 0.4 Area: (cm) 1. Volume: (cm) 0. [id number] Page 6 of 6 % Reduction in Area: 92.8% % Reduction in Volume: 94.2% Epithelialization: Medium (34-66%) 791 Tunneling: No 716 Undermining: Yes Starting Position (o'clock): 1 Ending Position (o'clock): 4 Maximum Distance: (cm) 0.5 Wound Description Classification: Category/Stage IV Wound Margin: Well defined, not attached Exudate Amount: Medium Exudate Type: Serosanguineous Exudate Color: red, brown Foul Odor After Cleansing: No Slough/Fibrino Yes Wound Bed Granulation Amount: Large (67-100%) Exposed Structure Granulation Quality: Red, Pink Fascia Exposed: No Necrotic Amount: Small (1-33%) Fat Layer (Subcutaneous Tissue) Exposed: Yes Necrotic Quality: Eschar, Adherent Slough Tendon Exposed: No Muscle Exposed: No Joint Exposed: No Bone Exposed: No Periwound Skin Texture Texture Color No Abnormalities Noted: No No Abnormalities Noted: Yes Callus: Yes Temperature / Pain Crepitus: No Temperature: No Abnormality Excoriation: No Induration: No Rash: No Scarring: No Moisture No Abnormalities Noted: Yes Electronic Signature(s) Signed: 03/22/2023 4:17:42 PM By: Karie Schwalbe RN Entered By: Karie Schwalbe on 03/22/2023 11:11:26 -------------------------------------------------------------------------------- Vitals Details Patient Name: Date of Service: [name], [name] 03/22/2023 11:00 A M Medical Record Number: [id number] Patient Account Number: [id number] Date of Birth/Sex: Treating RN: 17-Apr-1951 (72 y.o. [name] Primary Care Dimond Crotty: Annita Brod Other Clinician: Referring Livingston Denner: Treating Finnlee Silvernail/Extender: Satira Sark in Treatment: 66 Vital Signs Time Taken: 10:49 Temperature (F): 98.5 Height (in): 69 Pulse (bpm): 57 Weight (lbs): 165 Respiratory Rate (breaths/min): 18 Body Mass Index (BMI): 24.4 Blood Pressure (mmHg): 131/68 Reference Range: 80 - 120 mg / dl Electronic Signature(s) Signed: 03/22/2023 4:17:42 PM By: Karie Schwalbe RN Entered By: Karie Schwalbe on 03/22/2023 11:02:07  [place], [name] ([id number]) [id number] Page 1 of 6 Visit Report for 03/22/2023 Arrival Information Details Patient Name: Date of Service: [name] RO N 03/22/2023 11:00 A M Medical Record Number: [id number] Patient Account Number: [id number] Date of Birth/Sex: Treating RN: Nov 19, 1950 (72 y.o. [name] Primary Care Augustino Savastano: Annita Brod Other Clinician: Referring Ninnie Fein: Treating Nohely Whitehorn/Extender: Satira Sark in Treatment: 76 Visit Information History Since Last Visit Added or deleted any medications: No Patient Arrived: Wheel Chair Any new allergies or adverse reactions: No Arrival Time: 10:48 Had a fall or experienced change in No Accompanied By: sister activities of daily living that may affect Transfer Assistance: None risk of falls: Patient Identification Verified: Yes Signs or symptoms of abuse/neglect since last visito No Patient Requires Transmission-Based Precautions: No Hospitalized since last visit: No Patient Has Alerts: Yes Implantable device outside of the clinic excluding No Patient Alerts: Patient on Blood Thinner cellular tissue based products placed in the center since last visit: Has Dressing in Place as Prescribed: Yes Pain Present Now: No Electronic Signature(s) Signed: 03/22/2023 4:17:42 PM By: Karie Schwalbe RN Entered By: Karie Schwalbe on 03/22/2023 10:49:08 -------------------------------------------------------------------------------- Encounter Discharge Information Details Patient Name: Date of Service: [name], [name] 03/22/2023 11:00 A M Medical Record Number: [id number] Patient Account Number: [id number] Date of Birth/Sex: Treating RN: 1950/08/08 (72 y.o. [name] Primary Care Maksim Peregoy: Annita Brod Other Clinician: Referring Danyia Borunda: Treating Amiel Sharrow/Extender: Satira Sark in Treatment: 46 Encounter Discharge  Information Items Post Procedure Vitals Discharge Condition: Stable Temperature (F): 98.5 Ambulatory Status: Wheelchair Pulse (bpm): 57 Discharge Destination: Home Respiratory Rate (breaths/min): 18 Transportation: Private Auto Blood Pressure (mmHg): 131/68 Accompanied By: sister Schedule Follow-up Appointment: Yes Clinical Summary of Care: Patient Declined Notes Pt. may be going to live in [place] Electronic Signature(s) Signed: 03/22/2023 4:17:42 PM By: Karie Schwalbe RN Entered By: Karie Schwalbe on 03/22/2023 16:16:52 [name], [name] ([id number]) [id number] Page 2 of 6 -------------------------------------------------------------------------------- Lower Extremity Assessment Details Patient Name: Date of Service: Maren Reamer 03/22/2023 11:00 A M Medical Record Number: [id number] Patient Account Number: [id number] Date of Birth/Sex: Treating RN: 1951/04/04 (72 y.o. [name] Primary Care Burak Zerbe: Annita Brod Other Clinician: Referring Debra Colon: Treating Georgeann Brinkman/Extender: Satira Sark in Treatment: 67 Edema Assessment Assessed: Kyra Searles: No] [Right: No] Edema: [Left: N] [Right: o] Calf Left: Right: Point of Measurement: From Medial Instep 34.9 cm Ankle Left: Right: Point of Measurement: From Medial Instep 23.9 cm Vascular Assessment Pulses: Dorsalis Pedis Palpable: [Right:Yes] Electronic Signature(s) Signed: 03/22/2023 4:17:42 PM By: Karie Schwalbe RN Entered By: Karie Schwalbe on 03/22/2023 10:49:27 -------------------------------------------------------------------------------- Multi Wound Chart Details Patient Name: Date of Service: [name], [name] 03/22/2023 11:00 A M Medical Record Number: [id number] Patient Account Number: [id number] Date of Birth/Sex: Treating RN: Jul 19, 1951 (72 y.o. M) Primary Care Milo Schreier: Annita Brod Other Clinician: Referring Abcde Oneil: Treating  Sinia Antosh/Extender: Satira Sark in Treatment: 34 Vital Signs Height(in): 69 Pulse(bpm): 57 Weight(lbs): 165 Blood Pressure(mmHg): 131/68 Body Mass Index(BMI): 24.4 Temperature(F): 98.5 Respiratory Rate(breaths/min): 18 [1:Photos:] [N/A:N/A] Right Calcaneus N/A N/A Wound Location: Pressure Injury N/A N/A Wounding Event: Pressure Ulcer N/A N/A Primary Etiology: Cataracts, Hypertension, Cirrhosis , N/A N/A Comorbid History: Hepatitis C 07/02/2020 N/A N/A Date Acquired: 19 N/A N/A Weeks of Treatment: Open N/A N/A Wound Status: No N/A N/A Wound Recurrence: 1.2x1.9x0.4 N/A N/A Measurements L x W x D (cm) 1.791 N/A N/A  correctly Electronic Signature(s) Signed: 03/22/2023 4:17:42 PM By: Karie Schwalbe RN Entered By: Karie Schwalbe on 03/22/2023 16:15:20 -------------------------------------------------------------------------------- Wound Assessment Details Patient Name: Date of Service: [name], [place] RO N 03/22/2023 11:00 A M Medical Record Number: [id number] Patient Account Number: [id number] Date of Birth/Sex: Treating RN: 1951-05-01 (72 y.o. [name] Primary Care Jaser Fullen: Annita Brod Other Clinician: Referring Phillp Dolores: Treating Peggyann Zwiefelhofer/Extender: Satira Sark in Treatment: 6 Wound Status Wound  Number: 1 Primary Etiology: Pressure Ulcer Wound Location: Right Calcaneus Wound Status: Open Wounding Event: Pressure Injury Comorbid History: Cataracts, Hypertension, Cirrhosis , Hepatitis C Date Acquired: 07/02/2020 Weeks Of Treatment: 67 Clustered Wound: No Photos Wound Measurements [name], Zyren ([id number]) Length: (cm) 1.2 Width: (cm) 1.9 Depth: (cm) 0.4 Area: (cm) 1. Volume: (cm) 0. [id number] Page 6 of 6 % Reduction in Area: 92.8% % Reduction in Volume: 94.2% Epithelialization: Medium (34-66%) 791 Tunneling: No 716 Undermining: Yes Starting Position (o'clock): 1 Ending Position (o'clock): 4 Maximum Distance: (cm) 0.5 Wound Description Classification: Category/Stage IV Wound Margin: Well defined, not attached Exudate Amount: Medium Exudate Type: Serosanguineous Exudate Color: red, brown Foul Odor After Cleansing: No Slough/Fibrino Yes Wound Bed Granulation Amount: Large (67-100%) Exposed Structure Granulation Quality: Red, Pink Fascia Exposed: No Necrotic Amount: Small (1-33%) Fat Layer (Subcutaneous Tissue) Exposed: Yes Necrotic Quality: Eschar, Adherent Slough Tendon Exposed: No Muscle Exposed: No Joint Exposed: No Bone Exposed: No Periwound Skin Texture Texture Color No Abnormalities Noted: No No Abnormalities Noted: Yes Callus: Yes Temperature / Pain Crepitus: No Temperature: No Abnormality Excoriation: No Induration: No Rash: No Scarring: No Moisture No Abnormalities Noted: Yes Electronic Signature(s) Signed: 03/22/2023 4:17:42 PM By: Karie Schwalbe RN Entered By: Karie Schwalbe on 03/22/2023 11:11:26 -------------------------------------------------------------------------------- Vitals Details Patient Name: Date of Service: [name], [name] 03/22/2023 11:00 A M Medical Record Number: [id number] Patient Account Number: [id number] Date of Birth/Sex: Treating RN: 17-Apr-1951 (72 y.o. [name] Primary Care Dimond Crotty: Annita Brod Other Clinician: Referring Livingston Denner: Treating Finnlee Silvernail/Extender: Satira Sark in Treatment: 66 Vital Signs Time Taken: 10:49 Temperature (F): 98.5 Height (in): 69 Pulse (bpm): 57 Weight (lbs): 165 Respiratory Rate (breaths/min): 18 Body Mass Index (BMI): 24.4 Blood Pressure (mmHg): 131/68 Reference Range: 80 - 120 mg / dl Electronic Signature(s) Signed: 03/22/2023 4:17:42 PM By: Karie Schwalbe RN Entered By: Karie Schwalbe on 03/22/2023 11:02:07

## 2023-04-05 ENCOUNTER — Encounter (HOSPITAL_BASED_OUTPATIENT_CLINIC_OR_DEPARTMENT_OTHER): Payer: 59 | Admitting: General Surgery
